# Patient Record
Sex: Female | Born: 1950
Health system: Southern US, Community
[De-identification: ages and names within clinical notes are randomized; demographics above are authoritative.]

## PROBLEM LIST (undated history)

## (undated) DIAGNOSIS — F32A Depression, unspecified: Secondary | ICD-10-CM

## (undated) DIAGNOSIS — E209 Hypoparathyroidism, unspecified: Secondary | ICD-10-CM

## (undated) DIAGNOSIS — E876 Hypokalemia: Secondary | ICD-10-CM

## (undated) DIAGNOSIS — E119 Type 2 diabetes mellitus without complications: Secondary | ICD-10-CM

## (undated) DIAGNOSIS — C73 Malignant neoplasm of thyroid gland: Secondary | ICD-10-CM

## (undated) DIAGNOSIS — IMO0001 Reserved for inherently not codable concepts without codable children: Secondary | ICD-10-CM

## (undated) DIAGNOSIS — I10 Essential (primary) hypertension: Secondary | ICD-10-CM

## (undated) DIAGNOSIS — E785 Hyperlipidemia, unspecified: Secondary | ICD-10-CM

## (undated) DIAGNOSIS — I509 Heart failure, unspecified: Secondary | ICD-10-CM

## (undated) DIAGNOSIS — S0285XA Fracture of orbit, unspecified, initial encounter for closed fracture: Secondary | ICD-10-CM

## (undated) DIAGNOSIS — E890 Postprocedural hypothyroidism: Secondary | ICD-10-CM

## (undated) DIAGNOSIS — N189 Chronic kidney disease, unspecified: Secondary | ICD-10-CM

## (undated) DIAGNOSIS — I2510 Atherosclerotic heart disease of native coronary artery without angina pectoris: Secondary | ICD-10-CM

## (undated) DIAGNOSIS — G4733 Obstructive sleep apnea (adult) (pediatric): Secondary | ICD-10-CM

## (undated) DIAGNOSIS — F329 Major depressive disorder, single episode, unspecified: Secondary | ICD-10-CM

## (undated) HISTORY — DX: Obstructive sleep apnea (adult) (pediatric): G47.33

## (undated) HISTORY — PX: BREAST SURGERY: SHX581

## (undated) HISTORY — PX: TUBAL LIGATION: SHX77

## (undated) HISTORY — PX: OVARY SURGERY: SHX727

## (undated) HISTORY — DX: Heart failure, unspecified: I50.9

## (undated) HISTORY — DX: Chronic kidney disease, unspecified: N18.9

## (undated) HISTORY — PX: CHOLECYSTECTOMY: SHX55

## (undated) HISTORY — DX: Postprocedural hypothyroidism: E89.0

## (undated) HISTORY — DX: Atherosclerotic heart disease of native coronary artery without angina pectoris: I25.10

## (undated) HISTORY — DX: Hyperlipidemia, unspecified: E78.5

## (undated) HISTORY — PX: ORBITAL FRACTURE SURGERY: SHX725

## (undated) HISTORY — DX: Malignant neoplasm of thyroid gland: C73

## (undated) HISTORY — DX: Hypokalemia: E87.6

## (undated) HISTORY — DX: Hypoparathyroidism, unspecified: E20.9

## (undated) HISTORY — PX: TONSILLECTOMY: SUR1361

## (undated) HISTORY — PX: CARDIAC CATHETERIZATION: SHX172

## (undated) HISTORY — DX: Type 2 diabetes mellitus without complications: E11.9

## (undated) HISTORY — PX: COLONOSCOPY: SHX174

## (undated) HISTORY — DX: Essential (primary) hypertension: I10

---

## 2000-03-16 ENCOUNTER — Encounter (INDEPENDENT_AMBULATORY_CARE_PROVIDER_SITE_OTHER): Payer: Self-pay | Admitting: *Deleted

## 2000-03-16 ENCOUNTER — Ambulatory Visit (HOSPITAL_BASED_OUTPATIENT_CLINIC_OR_DEPARTMENT_OTHER): Admission: RE | Admit: 2000-03-16 | Discharge: 2000-03-16 | Payer: Self-pay | Admitting: General Surgery

## 2000-12-31 ENCOUNTER — Encounter: Payer: Self-pay | Admitting: Family Medicine

## 2000-12-31 ENCOUNTER — Encounter: Admission: RE | Admit: 2000-12-31 | Discharge: 2000-12-31 | Payer: Self-pay | Admitting: Family Medicine

## 2002-12-31 ENCOUNTER — Ambulatory Visit (HOSPITAL_BASED_OUTPATIENT_CLINIC_OR_DEPARTMENT_OTHER): Admission: RE | Admit: 2002-12-31 | Discharge: 2002-12-31 | Payer: Self-pay | Admitting: Internal Medicine

## 2003-04-24 ENCOUNTER — Inpatient Hospital Stay (HOSPITAL_COMMUNITY): Admission: AD | Admit: 2003-04-24 | Discharge: 2003-04-27 | Payer: Self-pay | Admitting: Cardiology

## 2003-07-11 ENCOUNTER — Observation Stay (HOSPITAL_COMMUNITY): Admission: EM | Admit: 2003-07-11 | Discharge: 2003-07-13 | Payer: Self-pay | Admitting: Emergency Medicine

## 2003-09-13 ENCOUNTER — Emergency Department (HOSPITAL_COMMUNITY): Admission: EM | Admit: 2003-09-13 | Discharge: 2003-09-14 | Payer: Self-pay | Admitting: Emergency Medicine

## 2004-07-01 ENCOUNTER — Ambulatory Visit (HOSPITAL_COMMUNITY): Admission: RE | Admit: 2004-07-01 | Discharge: 2004-07-01 | Payer: Self-pay | Admitting: Gastroenterology

## 2004-07-02 ENCOUNTER — Encounter (INDEPENDENT_AMBULATORY_CARE_PROVIDER_SITE_OTHER): Payer: Self-pay | Admitting: Specialist

## 2004-07-16 ENCOUNTER — Encounter: Admission: RE | Admit: 2004-07-16 | Discharge: 2004-10-14 | Payer: Self-pay | Admitting: Family Medicine

## 2005-03-04 ENCOUNTER — Encounter: Admission: RE | Admit: 2005-03-04 | Discharge: 2005-03-04 | Payer: Self-pay | Admitting: Orthopedic Surgery

## 2006-03-10 HISTORY — PX: THYROIDECTOMY: SHX17

## 2006-05-22 ENCOUNTER — Ambulatory Visit (HOSPITAL_COMMUNITY): Admission: RE | Admit: 2006-05-22 | Discharge: 2006-05-22 | Payer: Self-pay | Admitting: Family Medicine

## 2006-06-05 ENCOUNTER — Ambulatory Visit (HOSPITAL_COMMUNITY): Admission: RE | Admit: 2006-06-05 | Discharge: 2006-06-05 | Payer: Self-pay | Admitting: Family Medicine

## 2006-06-05 ENCOUNTER — Encounter (INDEPENDENT_AMBULATORY_CARE_PROVIDER_SITE_OTHER): Payer: Self-pay | Admitting: *Deleted

## 2006-09-15 ENCOUNTER — Ambulatory Visit (HOSPITAL_COMMUNITY): Admission: RE | Admit: 2006-09-15 | Discharge: 2006-09-15 | Payer: Self-pay | Admitting: Surgery

## 2006-10-26 ENCOUNTER — Ambulatory Visit (HOSPITAL_COMMUNITY): Admission: RE | Admit: 2006-10-26 | Discharge: 2006-10-27 | Payer: Self-pay | Admitting: Surgery

## 2006-10-26 ENCOUNTER — Encounter (INDEPENDENT_AMBULATORY_CARE_PROVIDER_SITE_OTHER): Payer: Self-pay | Admitting: Surgery

## 2006-11-03 ENCOUNTER — Ambulatory Visit: Payer: Self-pay | Admitting: Endocrinology

## 2006-12-14 ENCOUNTER — Encounter: Payer: Self-pay | Admitting: *Deleted

## 2006-12-14 DIAGNOSIS — E119 Type 2 diabetes mellitus without complications: Secondary | ICD-10-CM

## 2006-12-14 DIAGNOSIS — E785 Hyperlipidemia, unspecified: Secondary | ICD-10-CM

## 2006-12-14 DIAGNOSIS — I2510 Atherosclerotic heart disease of native coronary artery without angina pectoris: Secondary | ICD-10-CM | POA: Insufficient documentation

## 2006-12-14 DIAGNOSIS — I509 Heart failure, unspecified: Secondary | ICD-10-CM

## 2006-12-14 DIAGNOSIS — I10 Essential (primary) hypertension: Secondary | ICD-10-CM | POA: Insufficient documentation

## 2006-12-14 HISTORY — DX: Atherosclerotic heart disease of native coronary artery without angina pectoris: I25.10

## 2006-12-14 HISTORY — DX: Type 2 diabetes mellitus without complications: E11.9

## 2006-12-14 HISTORY — DX: Essential (primary) hypertension: I10

## 2006-12-14 HISTORY — DX: Hyperlipidemia, unspecified: E78.5

## 2006-12-14 HISTORY — DX: Heart failure, unspecified: I50.9

## 2006-12-15 ENCOUNTER — Encounter: Payer: Self-pay | Admitting: Endocrinology

## 2006-12-15 ENCOUNTER — Ambulatory Visit: Payer: Self-pay | Admitting: Endocrinology

## 2006-12-15 DIAGNOSIS — E890 Postprocedural hypothyroidism: Secondary | ICD-10-CM

## 2006-12-15 DIAGNOSIS — C73 Malignant neoplasm of thyroid gland: Secondary | ICD-10-CM | POA: Insufficient documentation

## 2006-12-15 HISTORY — DX: Malignant neoplasm of thyroid gland: C73

## 2006-12-15 HISTORY — DX: Postprocedural hypothyroidism: E89.0

## 2006-12-15 LAB — CONVERTED CEMR LAB
Sed Rate: 20 mm/hr (ref 0–25)
TSH: 0.73 microintl units/mL (ref 0.35–5.50)

## 2006-12-21 ENCOUNTER — Ambulatory Visit: Payer: Self-pay | Admitting: Endocrinology

## 2006-12-21 ENCOUNTER — Encounter: Payer: Self-pay | Admitting: Endocrinology

## 2006-12-21 DIAGNOSIS — E209 Hypoparathyroidism, unspecified: Secondary | ICD-10-CM

## 2006-12-21 HISTORY — DX: Hypoparathyroidism, unspecified: E20.9

## 2006-12-21 LAB — CONVERTED CEMR LAB
CO2: 33 meq/L — ABNORMAL HIGH (ref 19–32)
GFR calc Af Amer: 66 mL/min
Glucose, Bld: 225 mg/dL — ABNORMAL HIGH (ref 70–99)
Potassium: 3.2 meq/L — ABNORMAL LOW (ref 3.5–5.1)

## 2006-12-24 ENCOUNTER — Encounter: Payer: Self-pay | Admitting: Endocrinology

## 2007-01-13 ENCOUNTER — Ambulatory Visit: Payer: Self-pay | Admitting: Endocrinology

## 2007-01-13 DIAGNOSIS — E876 Hypokalemia: Secondary | ICD-10-CM

## 2007-01-13 HISTORY — DX: Hypokalemia: E87.6

## 2007-01-13 LAB — CONVERTED CEMR LAB
Chloride: 98 meq/L (ref 96–112)
Creatinine, Ser: 1.4 mg/dL — ABNORMAL HIGH (ref 0.4–1.2)
Glucose, Bld: 99 mg/dL (ref 70–99)
Sodium: 142 meq/L (ref 135–145)

## 2007-01-14 LAB — CONVERTED CEMR LAB: PTH: <2.5 pg/mL — ABNORMAL LOW (ref 14.0–72.0)

## 2007-01-20 ENCOUNTER — Ambulatory Visit: Payer: Self-pay | Admitting: Pulmonary Disease

## 2007-01-20 ENCOUNTER — Encounter: Payer: Self-pay | Admitting: Endocrinology

## 2007-01-20 ENCOUNTER — Telehealth (INDEPENDENT_AMBULATORY_CARE_PROVIDER_SITE_OTHER): Payer: Self-pay | Admitting: *Deleted

## 2007-02-23 ENCOUNTER — Ambulatory Visit: Payer: Self-pay | Admitting: Pulmonary Disease

## 2007-02-23 DIAGNOSIS — G4733 Obstructive sleep apnea (adult) (pediatric): Secondary | ICD-10-CM

## 2007-02-23 HISTORY — DX: Obstructive sleep apnea (adult) (pediatric): G47.33

## 2007-03-09 ENCOUNTER — Telehealth (INDEPENDENT_AMBULATORY_CARE_PROVIDER_SITE_OTHER): Payer: Self-pay | Admitting: *Deleted

## 2007-05-12 ENCOUNTER — Telehealth: Payer: Self-pay | Admitting: Pulmonary Disease

## 2007-05-18 ENCOUNTER — Encounter: Payer: Self-pay | Admitting: Pulmonary Disease

## 2007-05-22 ENCOUNTER — Encounter: Payer: Self-pay | Admitting: Pulmonary Disease

## 2007-06-15 ENCOUNTER — Encounter: Payer: Self-pay | Admitting: Pulmonary Disease

## 2007-06-15 ENCOUNTER — Ambulatory Visit: Payer: Self-pay | Admitting: Endocrinology

## 2007-06-15 LAB — CONVERTED CEMR LAB
BUN: 30 mg/dL — ABNORMAL HIGH (ref 6–23)
Chloride: 95 meq/L — ABNORMAL LOW (ref 96–112)
GFR calc non Af Amer: 49 mL/min
Glucose, Bld: 121 mg/dL — ABNORMAL HIGH (ref 70–99)
Potassium: 3.2 meq/L — ABNORMAL LOW (ref 3.5–5.1)

## 2007-06-17 LAB — CONVERTED CEMR LAB
Calcium, Total (PTH): 9.5 mg/dL (ref 8.4–10.5)
PTH: 3 pg/mL — ABNORMAL LOW (ref 14.0–72.0)

## 2007-06-23 ENCOUNTER — Telehealth: Payer: Self-pay | Admitting: Endocrinology

## 2007-08-11 ENCOUNTER — Telehealth: Payer: Self-pay | Admitting: Internal Medicine

## 2007-08-13 ENCOUNTER — Encounter: Payer: Self-pay | Admitting: Internal Medicine

## 2007-09-06 ENCOUNTER — Encounter: Payer: Self-pay | Admitting: Endocrinology

## 2007-12-13 ENCOUNTER — Ambulatory Visit: Payer: Self-pay | Admitting: Endocrinology

## 2007-12-13 LAB — CONVERTED CEMR LAB: TSH: 0.74 microintl units/mL (ref 0.35–5.50)

## 2008-07-18 ENCOUNTER — Ambulatory Visit: Payer: Self-pay | Admitting: Endocrinology

## 2008-07-23 ENCOUNTER — Encounter: Payer: Self-pay | Admitting: Endocrinology

## 2010-03-18 ENCOUNTER — Encounter: Payer: Self-pay | Admitting: Endocrinology

## 2010-03-18 LAB — CONVERTED CEMR LAB
Albumin: 4.3 g/dL
Alkaline Phosphatase: 53 units/L
BUN: 30 mg/dL
CO2: 32 meq/L
Calcium: 8.6 mg/dL
Creatinine, Ser: 1.23 mg/dL
Direct LDL: 164 mg/dL
GFR calc non Af Amer: 44.39 mL/min
Hgb A1c MFr Bld: 6.8 %
Lymphocytes, automated: 2.6 %
MCV: 91.1 fL
Monocytes Relative: 0.7 %
Neutrophils Relative %: 5.1 %
Platelets: 311 10*3/uL
RDW: 12.7 %
Sodium: 140 meq/L
Total Protein: 7.3 g/dL

## 2010-03-31 ENCOUNTER — Encounter: Payer: Self-pay | Admitting: Family Medicine

## 2010-04-01 ENCOUNTER — Encounter: Payer: Self-pay | Admitting: Endocrinology

## 2010-04-09 ENCOUNTER — Ambulatory Visit: Admission: RE | Admit: 2010-04-09 | Payer: Self-pay | Source: Home / Self Care | Admitting: Endocrinology

## 2010-04-09 ENCOUNTER — Encounter: Payer: Self-pay | Admitting: Endocrinology

## 2010-04-15 DIAGNOSIS — E890 Postprocedural hypothyroidism: Secondary | ICD-10-CM

## 2010-04-17 NOTE — Assessment & Plan Note (Signed)
Summary: F/U DIABETES/#/CD   Vital Signs:  Patient profile:   60 year old female Height:      62 inches (157.48 cm) Weight:      189 pounds (85.91 kg) BMI:     34.69 O2 Sat:      96 % on Room air Temp:     98.8 degrees F (37.11 degrees C) oral Pulse rate:   65 / minute BP sitting:   146 / 88  (left arm) Cuff size:   large  Vitals Entered By: Rebeca Alert CMA Deborra Medina) (April 09, 2010 8:47 AM)  O2 Flow:  Room air CC: Follow up on thyroid and DM/pt is no longer taking Plavix or Isosorbide Dinitrate/aj Is Patient Diabetic? Yes   Referring Provider:  Antony Contras MD Primary Provider:  Antony Contras MD  CC:  Follow up on thyroid and DM/pt is no longer taking Plavix or Isosorbide Dinitrate/aj.  History of Present Illness: (pt rescheduled)  Current Medications (verified): 1)  Tenormin 50 Mg  Tabs (Atenolol) .... Take 1 By Mouth Qd 2)  Plavix 75 Mg  Tabs (Clopidogrel Bisulfate) .... Take 1 By Mouth Qd 3)  Zestril 20 Mg  Tabs (Lisinopril) .... Take 1 By Mouth Qd 4)  Hydrochlorothiazide 25 Mg  Tabs (Hydrochlorothiazide) .... Take 1/2 Tab By Mouth Once Daily 5)  Levothyroxine Sodium 125 Mcg  Tabs (Levothyroxine Sodium) .... Take 1 By Mouth Qd 6)  Bayer Low Strength 81 Mg  Tbec (Aspirin) .... Take 1 Tablet By Mouth Once A Day 7)  Triazolam 0.25 Mg Tabs (Triazolam) .... 2 Tablets By Mouth At Bedtime 8)  Isosorbide Dinitrate 30 Mg  Tabs (Isosorbide Dinitrate) .... Take 1 By Mouth Qd 9)  Oscal 500/200 D-3 500-200 Mg-Unit  Tabs (Calcium-Vitamin D) .... Take 2 By Mouth Qd 10)  Calcitriol 0.25 Mcg Caps (Calcitriol) .Marland Kitchen.. 1 Qd 11)  One-A-Day Extras Antioxidant  Caps (Multiple Vitamins-Minerals) .Marland Kitchen.. 1 By Mouth Once Daily 12)  Fish Oil 1000 Mg Caps (Omega-3 Fatty Acids) .Marland Kitchen.. 1 Capsule By Mouth Once Daily 13)  Metformin Hcl 1000 Mg Tabs (Metformin Hcl) .Marland Kitchen.. 1 Tablet By Mouth Two Times A Day  Allergies (verified): No Known Drug Allergies   Medications Added to Medication List This  Visit: 1)  Triazolam 0.25 Mg Tabs (Triazolam) .... 2 tablets by mouth at bedtime 2)  One-a-day Extras Antioxidant Caps (Multiple vitamins-minerals) .Marland Kitchen.. 1 by mouth once daily 3)  Fish Oil 1000 Mg Caps (Omega-3 fatty acids) .Marland Kitchen.. 1 capsule by mouth once daily 4)  Metformin Hcl 1000 Mg Tabs (Metformin hcl) .Marland Kitchen.. 1 tablet by mouth two times a day  Patient Instructions: 1)  same levothyroxine (125/day) 2)  resume calcitriol (0.25 micrograms/d) 3)  ret 6 mos

## 2010-04-17 NOTE — Letter (Signed)
Summary: [facility]   Imported By: Phillis Knack 04/12/2010 11:35:38  _____________________________________________________________________  External Attachment:    Type:   Image     Comment:   External Document

## 2010-04-25 ENCOUNTER — Encounter: Payer: Self-pay | Admitting: Endocrinology

## 2010-04-25 ENCOUNTER — Ambulatory Visit (INDEPENDENT_AMBULATORY_CARE_PROVIDER_SITE_OTHER): Payer: BC Managed Care – PPO | Admitting: Endocrinology

## 2010-04-25 ENCOUNTER — Other Ambulatory Visit: Payer: BC Managed Care – PPO

## 2010-04-25 ENCOUNTER — Encounter (INDEPENDENT_AMBULATORY_CARE_PROVIDER_SITE_OTHER): Payer: Self-pay | Admitting: *Deleted

## 2010-04-25 ENCOUNTER — Other Ambulatory Visit: Payer: Self-pay | Admitting: Endocrinology

## 2010-04-25 DIAGNOSIS — C73 Malignant neoplasm of thyroid gland: Secondary | ICD-10-CM

## 2010-04-25 LAB — CONVERTED CEMR LAB
Calcium, Total (PTH): 8.2 mg/dL — ABNORMAL LOW (ref 8.4–10.5)
PTH: <2.5 pg/mL — ABNORMAL LOW (ref 14.0–72.0)

## 2010-05-07 NOTE — Assessment & Plan Note (Signed)
Summary: FOLLOW UP ON THYROID/NWS   Vital Signs:  Patient profile:   60 year old female Height:      62 inches (157.48 cm) Weight:      185 pounds (84.09 kg) BMI:     33.96 O2 Sat:      98 % on Room air Temp:     98.4 degrees F (36.89 degrees C) oral Pulse rate:   62 / minute Pulse rhythm:   regular BP sitting:   122 / 82  (left arm) Cuff size:   large  Vitals Entered By: Rebeca Alert CMA ([place]) (April 25, 2010 8:02 AM)  O2 Flow:  Room air CC: Follow up on thyroid/Pt is no  longer taking Plavix or Isosorbie Dintrate/aj Is Patient Diabetic? Yes   Referring Provider:  Antony Contras MD Primary Provider:  Antony Contras MD  CC:  Follow up on thyroid/Pt is no  longer taking Plavix or Isosorbie Dintrate/aj.  History of Present Illness: the status of at least 3 ongoing medical problems is addressed today: postsurgical hypoparathyroidism:  no cramps postsurgical hypothyroidism: no weight gain pt had thyroidectomy for 2 small foci of papillary cancer.  foci were not large enough to warrant i-131 rx.  she does not notice any nodule at the neck.  Current Medications (verified): 1)  Tenormin 50 Mg  Tabs (Atenolol) .... Take 1 By Mouth Qd 2)  Plavix 75 Mg  Tabs (Clopidogrel Bisulfate) .... Take 1 By Mouth Qd 3)  Zestril 20 Mg  Tabs (Lisinopril) .... Take 1 By Mouth Qd 4)  Hydrochlorothiazide 25 Mg  Tabs (Hydrochlorothiazide) .... Take 1/2 Tab By Mouth Once Daily 5)  Levothyroxine Sodium 125 Mcg  Tabs (Levothyroxine Sodium) .... Take 1 By Mouth Qd 6)  Bayer Low Strength 81 Mg  Tbec (Aspirin) .... Take 1 Tablet By Mouth Once A Day 7)  Triazolam 0.25 Mg Tabs (Triazolam) .... 2 Tablets By Mouth At Bedtime 8)  Isosorbide Dinitrate 30 Mg  Tabs (Isosorbide Dinitrate) .... Take 1 By Mouth Qd 9)  Oscal 500/200 D-3 500-200 Mg-Unit  Tabs (Calcium-Vitamin D) .... Take 2 By Mouth Qd 10)  Calcitriol 0.25 Mcg Caps (Calcitriol) .Marland Kitchen.. 1 Qd 11)  One-A-Day Extras Antioxidant  Caps (Multiple  Vitamins-Minerals) .Marland Kitchen.. 1 By Mouth Once Daily 12)  Fish Oil 1000 Mg Caps (Omega-3 Fatty Acids) .Marland Kitchen.. 1 Capsule By Mouth Once Daily 13)  Metformin Hcl 1000 Mg Tabs (Metformin Hcl) .Marland Kitchen.. 1 Tablet By Mouth Two Times A Day 14)  Citalopram Hydrobromide 40 Mg Tabs (Citalopram Hydrobromide) .Marland Kitchen.. 1 Tablet By Mouth Once Daily  Allergies (verified): No Known Drug Allergies  Past History:  Past Medical History: Stage 1 papillary adenocarcinoma THYROID, one very small focus each lobe (49mm, 2 mm)  (ICD-193) s/p thyroidect 2008 6/09: ct neck:  no evidence of recurrence HYPOTHYROIDISM, POSTSURGICAL (ICD-244.0 HYPERTENSION (ICD-401.9) HYPERLIPIDEMIA (ICD-272.4) DIABETES MELLITUS, TYPE II (ICD-250.00) CORONARY ARTERY DISEASE (ICD-414.00) CONGESTIVE HEART FAILURE (ICD-428.0) HYPOPARATHYROIDISM (ICD-252.1)  Review of Systems  The patient denies weight loss.         no neck pain  Physical Exam  General:  obese.   Neck:  a healed scar is present.  i do not appreciate a nodule in the thyroid or elsewhere in the neck  Additional Exam:  FastTSH                   0.53 uIU/mL   Parathyroid Hormone  [L]  <2.5 pg/mL  14.0-72.0 Calcium              [L]  8.2 mg/dL       Impression & Recommendations:  Problem # 1:  HYPOTHYROIDISM, POSTSURGICAL (ICD-244.0) she does not need suppression in view of the mild nature of #2  Problem # 2:  NEOPLASM, MALIGNANT, THYROID GLAND (ICD-193) h/o 2 very small foci resected.  no evidence of recurrence  Problem # 3:  HYPOPARATHYROIDISM (ICD-252.1) postsurgical.  studies have supported a therapeutic goal of ca++ of 8-9, rather than the normal range.    Medications Added to Medication List This Visit: 1)  Citalopram Hydrobromide 40 Mg Tabs (Citalopram hydrobromide) .Marland Kitchen.. 1 tablet by mouth once daily  Other Orders: T-Parathyroid Hormone, Intact w/ Calcium [id number]) TLB-TSH (Thyroid Stimulating Hormone) (84443-TSH) Est. Patient Level IV  [id number])  Patient Instructions: 1)  blood tests are being ordered for you today.  please call [phone] to hear your test results. 2)  pending the test results, please continue the same medications for now 3)  Please schedule a follow-up appointment in 1 year. 4)  (update: i left message on phone-tree:  rx as we discussed)   Orders Added: 1)  T-Parathyroid Hormone, Intact w/ Calcium [id number] 2)  TLB-TSH (Thyroid Stimulating Hormone) [84443-TSH] 3)  Est. Patient Level IV [id number]

## 2010-07-23 NOTE — Consult Note (Signed)
[place]   NAME:Sandy Roy, Sandy Roy                    MRN:          [id number]  DATE:11/03/2006                            DOB:          1950/06/25    REASON FOR REFERRAL:  Thyroid cancer.   HISTORY OF PRESENT ILLNESS:  A 60 year old woman who states that she has  noted a goiter for the past few years.  She underwent needle biopsy  earlier this year on both thyroid lobes and ultimately came to  thyroidectomy.   Regarding her postoperative hypothyroidism, I am asked to prescribe some  medication for her.  She states that she has only minimal dizziness, and  I am not even sure if this is related to the thyroid.   She also states she has a few muscle cramps since her surgery, but she  did not have postoperative hypocalcemia.   PAST MEDICAL HISTORY:  1. Type 2 diabetes.  2. Dyslipidemia.  3. CAD.  4. Apparent history of CHF.   SOCIAL HISTORY:  She is retired.  She has been widowed for many years.   FAMILY HISTORY:  Her mother had a thyroidectomy for an uncertain reason.   REVIEW OF SYSTEMS:  Denies syncope.  Denies shortness of breath.   PHYSICAL EXAMINATION:  Blood pressure is 116/79, heart rate 61,  temperature 98.1.  The weight is 173.  GENERAL:  No distress.  SKIN:  No rash, not diaphoretic.  HEENT:  No proptosis, no periorbital swelling.  NECK:  There is a healing surgical scar.  I do not appreciate any  lymphadenopathy at the neck nor at the supraclavicular area.  CHEST:  Clear to auscultation.  No respiratory distress.  CARDIOVASCULAR:  Trace bilateral pretibial edema.  Regular rate and  rhythm.  No murmur.  Pedal pulses are intact.  NEUROLOGIC:  Alert, oriented.  Does not appear anxious nor depressed.  There is no tremor.   LABORATORY STUDIES:  I have reviewed the pathology report from surgery  with the patient.  She had two postoperative calcium determinations in  the  hospital.  One was 8.8 and the other was 8.9.   IMPRESSION:  1. Stage I papillary adenocarcinoma of the thyroid.  Although she had      bilateral foci, they were very small.  2. She has the expected postoperative hypothyroidism.  3. A few muscle cramps in the setting of a low normal serum calcium      level.  The cramps may be due to her hypothyroidism.   PLAN:  1. I discussed with her the natural history of papillary      adenocarcinoma of the thyroid.  I have told her that the      overwhelming likelihood is that this thyroidectomy has been      curative.  2. Synthroid 125 mcg a day.  3. Return in about six weeks.  4. I have told her she does not need any treatment for this low-normal      calcium.     Sean A. Loanne Drilling, MD  Electronically Signed    SAE/MedQ  DD: 11/03/2006  DT: 11/03/2006  Job #: [id number]   cc:   Adin Hector, MD  Antony Contras, M.D.

## 2010-07-23 NOTE — Assessment & Plan Note (Signed)
[place]                             PULMONARY OFFICE NOTE   CATESSA, KNOX                    MRN:          [id number]  DATE:01/20/2007                            DOB:          1950/04/17    REFERRING PHYSICIAN:  Antony Contras, M.D.   SLEEP MEDICINE CONSULTATION   HISTORY OF PRESENT ILLNESS:  The patient is a 60 year old female whom I  have been asked to see for obstructive sleep apnea.  The patient was  first diagnosed in 2004 with an apnea/hypopnea index of 27 events per  hour and O2 desaturation as low as 76%.  The patient was tried on CPAP,  but she discontinued this because she could not watch TV at the same  time that she wore the CPAP.  The patient states that she has multiple  sleeping difficulties, including sleep onset insomnia.  She states that  she cannot go to sleep or stay asleep without sleeping pills.  If she  takes sleeping pills, she typically goes to bed between 9 and 11.  If  she does not take sleeping pills, she will not get to sleep until 4 a.m.  She typically will get up at 6 a.m. to start her day and feels  completely rested on nights she does take sleeping pills, and does not  feel rested on nights that she does not.  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  She denies decreased alertness or concentration issues with periods of  inactivity.  She has no difficulties with reading or watching TV.  She  has no sleepiness with driving.  Her weight is neutral over the last few  years.  The patient is most concerned with breathing issues that  actually occur during the day, and feels that she does not have a normal  breathing pattern.  She is wondering that, if she treats her sleep apnea  at night, if her breathing pattern would be more normal.   PAST MEDICAL HISTORY:  1. Hypertension.  2. Diabetes.  3. Dyslipidemia.  4. History of cholecystectomy in 1998.  5. History of thyroidectomy.   MEDICATIONS:  Unknown.   The patient has no known drug allergies.   SOCIAL HISTORY:  The patient is widowed and has children.  She has a  history of smoking, but does not remember when she quit.   FAMILY HISTORY:  Remarkable for a mother had a heart disease.  Father  had pancreatic cancer.   REVIEW OF SYSTEMS:  As per history of present illness.  Also see patient  intake form documented on the chart.   PHYSICAL EXAM:  GENERAL:  She is an obese female in no acute distress.  Blood pressure 134/84, pulse 61, temperature 98.1, weight 196 pounds.  She is 5 feet 3 inches tall.  O2 saturation on room air is 98%.  HEENT:  Pupils are equal, round, and reactive to light and  accommodation.  Extraocular muscles are intact.  Nares patent without  discharge.  Oropharynx does show elongation of the soft palate and  uvula.  NECK:  Supple without JVD or lymphadenopathy.  No palpable thyromegaly.  CHEST:  Totally clear.  CARDIAC:  Regular rate and rhythm.  No murmurs, rubs, or gallops.  ABDOMEN:  Soft and nontender with good bowel sounds.  GENITAL, RECTAL, AND BREASTS:  Not done and not indicated.  LOWER EXTREMITIES:  Without edema.  Pulses are intact distally.  NEUROLOGIC:  Alert and oriented with no obvious motor deficits.   IMPRESSION:  1. Moderate obstructive sleep apnea documented in the past on her      sleep study.  I suspect the patient is more symptomatic than she      lets on, and think that she would benefit from a trial of      treatment.  However, the patient has to be willing to make      sacrifices associated with CPAP therapy and has to be open-minded      in the initial trial period.  The patient is willing to do this,      but I am not sure how successful it will be for her.  I do think      that this is effecting her sleep much more than she is letting on.  2. Sleep onset insomnia that I suspect is secondary to      psychophysiologic insomnia.   I have told the patient that there is      a way that we can try to get her off of her sleeping medication,      but it will take great sacrifice on her part to do so, cognitive      behavioral therapy as well as stimulus control.  The patient is not      sure that she wants to do that at this particular time.   PLAN:  1. We will go ahead and place the patient on an auto-CPAP device for      the next 4 weeks since she has documented sleep apnea, and try and      find her optimal pressure, but also give her something that is very      comfortable right from the start.  2. The patient will follow up in 4 weeks, sooner if there are      problems.     Kathee Delton, MD,FCCP  Electronically Signed    [facility]MC/MedQ  DD: 01/25/2007  DT: 01/25/2007  Job #: [id number]   cc:   Antony Contras, M.D.

## 2010-07-23 NOTE — Op Note (Signed)
NAME:  Sandy Roy, Sandy Roy NO.:  [id number]   MEDICAL RECORD NO.:  [id number]          PATIENT TYPE:  OIB   LOCATION:  5706                         FACILITY:  [facility]   PHYSICIAN:  Adin Hector, MD     DATE OF BIRTH:  03-22-1950   DATE OF PROCEDURE:  DATE OF DISCHARGE:                               OPERATIVE REPORT   PRIMARY CARE PHYSICIAN:  Antony Contras, M.D.   ENDOCRINOLOGIST:  Cecille Amsterdam, M.D.,  with [facility] at [facility]   SURGEON:  Adin Hector, MD.   ASSISTANT SURGEON:  Thana Farr.   PREOPERATIVE DIAGNOSIS:  Multinodular goiter with recurrent cysts,  refractory to Synthroid repression, and worsening dysphagia and globus.   POSTOPERATIVE DIAGNOSIS:  Multinodular goiter with recurrent cysts,  refractory to Synthroid repression, and worsening dysphagia and globus.   PROCEDURE PERFORMED:  Total thyroidectomy.   ANESTHESIA:  1. General anesthesia.  2. Bupivacaine 0.25% with epinephrine in a field block around the      incision.   DRAINS:  None.   ESTIMATED BLOOD LOSS:  5 mL.   COMPLICATIONS:  None apparent.   INDICATIONS FOR PROCEDURE:  Sandy Roy is a 60 year old female who has  been struggling with multinodular goiter in the past for over a decade.  She has had intermittent followup.  She has had known cysts for several  years.  She has had at least two aspirations done in the past and,  although the fluid has tended to be benign, the cysts have come back.  She has tried Synthroid suppression without any help.   She was sent for an __________ consultation and initially they felt  observation would be reasonable.  However, the patient has started  developing worsening symptoms of fullness in her throat and some  difficulty swallowing and, based on worsening discomfort over the past  few months, she requested surgical consultation.  After discussion with  me and partners, we felt that a thyroidectomy would be reasonable.   The risks such as stroke, heart attack, deep vein thrombosis, pulmonary  embolism and death were discussed.  The risks such as bleeding,  ecchymosis, seroma, hematoma, wound infection, abscess, injury to other  organs were discussed.  The risks of nerve injury with resulting  difficulty speaking, singing or breathing were discussed as well.  The  risks of hoarseness, dysphagia and other pains were discussed.  The need  for long-term hormonal replacement and risks of long-term Synthroid were  discussed as well.  The risk of parathyroid gland injury, calcium risks  and hypocalcemic risks and other risks were discussed in detail.  Questions were answered and she agreed to procedure.   OPERATIVE FINDINGS:  Her superior lobe seemed relatively normal, but  both inferior lobes did have some moderate sized cysts within it.  She  had a larger cyst involving most of the isthmus laying right on top of  her trachea.  There was no invasion into any other structures.  The  recurrent laryngeal nerves could be seen on both sides easily and were  protected at all times.   DESCRIPTION  OF PROCEDURE:  Informed consent was confirmed just prior to  going to into the operating room.  She had sequential compression  devices active during the entire case.  She received IV antibiotics just  prior to surgery.  She was positioned supine with both arms tucked.  She  underwent general anesthesia without any difficulty.  She had a gentle  roll placed under her thoracic spine and her neck was gently  hyperextended for better positioning of her neck.  An initial 6 cm  ultimately extended to about a 7.5-cm curvilinear incision was made  about a fingerbreadth above her clavicles within the natural skin folds  of her neck.  A __________ was used to get through her subcutaneous fat  and platysmal plane.  Subplatysmal planes were created inferiorly and  superiorly.  Some moderate sized venous structures were encountered  and  skeletonized and these were controlled using harmonic cautery, clips  and/or ties.  I found a space in between her strap muscles in the  midline and did encounter the isthmus of the thyroid.   Attention was turned towards the right.  A plane was created posterior  to the strap muscles and the anterior right thyroid lobe using primary  blunt dissection to help skeletonize wispy structures and control those  for thin structures, and then clips and harmonic on medium structures,  and ties, clips and harmonic on larger structures.  Attention was  initially turned to getting towards the middle part of the lobe.  We  swung over superiorly and I was able to go up laterally and stay close  to the superior thyroid lobe cephalad until we encountered its apex.  The superior thyroid artery and vein were individually skeletonized and  ligated as noted above using 2-0 silk ties, clip and harmonic on the  neck side.  A window was made in the medial aspect of the superior  thyroid lobe as well and this allowed complete superior lobe  mobilization.  Attention was turned towards the inferior lobe and we  were able to skeletonize and easily see the inferior parathyroid and  superior parathyroid lobes and allow them to gently reflect off the  thyroid gland.  Again, the inferior parathyroid arterial and venous  branches were ligated closely to the thyroid gland.  There was somewhat  of a hardened calcific nodule posteriorly near the ligament of Berry,  but ultimately was able to get around that carefully.  The laryngeal  nerve on the right side could be seen and preserved and no cautery was  used on the posterior wall of the thyroid, once we were able to reflect  the thyroid in a lateral to medial fashion and help free the thyroid  isthmus off the trachea using sharp and cautery dissection anteriorly.   Attention was turned to mobilization of the left lobe of the thyroid.  Mobilization was done in a  similar fashion.  Initially there was a  thickened cystic lobule coming off the inferior thyroid, which was  thought could have been an enlarged parathyroid gland; but, after  further skeletonization, was actually a lingual branch off the inferior  thyroid and ultimately this was able to be skeletonized and preserved.  __________ encompassing the inferior parathyroid gland.  Again, the  recurrent laryngeal nerve on the left side could be seen and it was  preserved at all times.  Ultimately with careful mobilization,  alternating between inferior and superior and medial mobilization, I was  able to free up  the left lobe of the thyroid out along the isthmus and  it was completely extracted.   The specimen was labeled Short Stitch in the Superior Lobe of the Right  Thyroid; and a long stitch was placed in the Superior Lobe of the Left  Thyroid.   Note that careful hemostasis was maintained using pressure and clips and  Surgicel.  Careful wound inspection revealed excellent hemostasis with  nerves intact.  The four good candidates for parathyroid gland were in  classic anatomic position and appeared to be not bruised or damaged.  With good hemostasis, we reapproximated the straps in the midline using  figure-of-eight 3-0 Vicryl interrupted stitches, leaving an inferior  centimeter free.  The platysma was reapproximated transversely using 3-0  Vicryl interrupted figure-of-eight stitches as well.  The skin was  closed using a 4-0 Prolene stitch in a running subcuticular fashion,  with the tails brought out either side as Steri-Strips were placed  across the incision as well with good result.  A sterile dressing was  applied.  The patient was activated and sent to the recovery room in  stable condition.   An attempt was locate family, but the patient had none with her and did  not have anybody she wished for me to call.  I rechecked and the waiting  area did not have anybody as well.  I will  try and talk to her later.      Adin Hector, MD  Electronically Signed     SCG/MEDQ  D:  10/26/2006  T:  10/26/2006  Job:  [id number]   cc:   Antony Contras, M.D.  Cecille Amsterdam, M.D.

## 2010-07-26 NOTE — Discharge Summary (Signed)
NAME:  Sandy Roy, Sandy Roy                       ACCOUNT NO.:  [id number]   MEDICAL RECORD NO.:  [id number]                   PATIENT TYPE:  INP   LOCATION:  [id number]                                 FACILITY:  [facility]   PHYSICIAN:  Kerry Hough., M.D.         DATE OF BIRTH:  1950/12/23   DATE OF ADMISSION:  04/24/2003  DATE OF DISCHARGE:  04/27/2003                                 DISCHARGE SUMMARY   DISCHARGE DIAGNOSES:  1. Prolonged chest discomfort with borderline troponin consistent with     myocardial ischemia.     A. Catheterization showed anteroapical hypokinesis.     B. Electrocardiogram showed anterior T wave inversions; however, left        anterior descending showed only a small distal left anterior        descending suggesting coronary spasm as the cause.  2. Type II diabetes mellitus.  3. Cigarette abuse.  4. Hypertension.  5. Anxiety and depression.  6. Hyperlipidemia.  7. Obesity.   PROCEDURE:  Cardiac catheterization.   HISTORY:  [age]-year-old female who has a prior history of an occluded  right coronary artery in 1997.  She has known hypertension, hyperlipidemia  and obesity, and has recently started smoking again. She had a Cardiolite  scan in December that did not show significant ischemia.  She had been  walking, exercising and feeling well.  The day prior to admission she  developed increasing dyspnea and midsternal tightness that lasted through  the afternoon.  After three hours of discomfort her daughter was called and  she was taken to [facility] where she was admitted.   EKG was unremarkable, but she did develop some biphasic anterior T wave  inversions.  Troponins were borderline at 0.1 and she is transferred per her  request to [facility] for further evaluation.   Please see previously dictated history and physical for the remainder of the  details.   HOSPITAL COURSE:  Laboratory data showed a B-natruretic peptide  of 1,590.  Hemoglobin was 19.9 and hematocrit was 32.6.  sodium was 140, potassium 3.6,  chloride 104, glucose 143, and hemoglobin A-1-C was 63.  Cholesterol was  198, triglycerides 150.  HDL cholesterol was 54.  LDL cholesterol was 114.  B-natruretic peptide was 727 at [facility] and troponin 0.11, which  was borderline.   The patient was admitted and was placed on therapy with anticoagulation.  Her medicines were increased.  She did develop significant biphasic T wave  inversions, but has not recurrence of chest discomfort.  She was given Lasix  because of the elevated BNP as well as her dyspnea.  Chest x-ray showed mild  cardiomegaly with no acute process noted.  She was to go to the  catheterization laboratory, but the procedure was cancelled due to cath lab  emergencies when her case was bumped and we could not take her to the lab.  She was taken  to the laboratory on April 26, 2003 with findings of an  anteroapical wall motion abnormality noted at catheterization.  The right  coronary artery was occluded.  The circumflex was tortuous, but did not have  significant disease.  The left anterior descending did not have significant  proximal disease, the distal vessel was tortuous and was somewhat small, but  no significant obstructive stenoses were noted.   It was also apparent that the patient had resumed smoking and she was  advised to discontinue cigarette smoking.   The patient was ambulatory in the hall and was seen by cardiac rehab.   The patient was discharged in improved condition begin treated presumptively  for significant coronary artery spasm.   DISCHARGE MEDICATIONS:  The patient was discharge at the time on:  1. Aspirin 81 mg daily,  2. Plavix 75 mg daily.  3. Zocor 40 mg daily, which was increased.  4. Lasix 20 mg daily.  5. Cardizem CD 360 mg daily.  6. Imdur 30 mg daily.  7. _____________ 0.25 mg 2 tablets q.h.s.  8. Prinivil 20 mg daily.  9.  Avandamet 2/100 mg daily.  10.      Atenolol 50 mg daily.   DISCHARGE INSTRUCTIONS:  The patient is to be out of work for two weeks and  is to walk daily.  She is to call if there are further problems.   FOLLOW UP:  We will see her in follow up in one week for further evaluation.                                                Kerry Hough., M.D.    WST/MEDQ  D:  04/27/2003  T:  04/28/2003  Job:  [id number]   cc:   Biagio Borg, M.D.  [address], [place]  [place]  [place] [address]  Fax: [phone]

## 2010-07-26 NOTE — H&P (Signed)
NAME:  Sandy Roy, Sandy Roy                       ACCOUNT NO.:  [id number]   MEDICAL RECORD NO.:  [id number]                   PATIENT TYPE:  OBV   LOCATION:  [id number]                                 FACILITY:  [facility]   PHYSICIAN:  Annita Brod, M.D.            DATE OF BIRTH:  14-Jun-1950   DATE OF ADMISSION:  07/11/2003  DATE OF DISCHARGE:                                HISTORY & PHYSICAL   PRIMARY CARE PHYSICIAN:  Biagio Borg, M.D.   CARDIOLOGIST:  Ezzard Standing, M.D.   CHIEF COMPLAINT:  Lightheadedness, almost passing out.   This is a 60 year old African American female, past medical history of  diabetes, CAD, and obesity who presents with a one day history of dizziness.  The patient states overall she has been relatively well.  For the past day  or so, she has been feeling increasingly fatigued and weak. She denies any  vertigo like symptoms.  She denies any nausea or vomiting.  But today she  felt very lightheaded and had to sit down.  She did not actually pass out.  She remained conscious the entire time as well as having full recollection.  She felt slightly diaphoretic, but she denied any chest pain or  palpitations.  She denied any shortness of breath, wheeze, or cough.  She  denies any recent fevers or chills.  She has no sick contacts.  She was  brought into the emergency room via paramedics.  When the patient first came  in, she was noted to have a heart rate of 50 with a blood pressure of  121/63.  She was started on telemetry, and her heart rate has remained low,  no higher than 46.  Her blood pressure has remained stable, although reports  by paramedics en route it appears the patient had a lower blood pressure  with a systolic in the Q000111Q.  Actually, it appears to be that her blood  pressure was in the Q000111Q systolic at work but en route it was stable in the  120s.  An EKG was done which showed no evidence of any ST-T abnormalities,  but a marked sinus  bradycardia, with a first degree A-V block, and a heart  rate of 44.  Attempts at IV fluids and checking a set of cardiac markers  were limited due to the patient's IV access which is currently being worked  on.  Currently, the patient states that she is feeling better.  While  resting, she denies any dizziness.  She denies any lightheadedness when she  lies down.  Even when sitting up, she denies any lightheadedness, but when  standing she does feel slightly dizzy.  She denies any chest pain or  palpitations.  She has no other complaints.  Her heart rate continues to  remain low.  The patient otherwise denies any abdominal pain.  She denies  any hematuria, dysuria, or constipation, diarrhea.  She denies any  focal  pain, extremity weakness, numbness, tingling.  She states her diabetes is  not too well controlled, and she has ups and downs.   PAST MEDICAL HISTORY:  1. Diabetes.  2. Coronary artery disease with an admission, in February 2005.  At that     time, she was noted to have evidence of borderline ischemia.  3. She has a history of obesity.  4. Not well controlled diabetes mellitus.   MEDICATIONS:  The patient, according to her previous discharge summary, was  on:  1. Cardizem 360.  2. Plavix 75.  3. Aspirin 81 mg daily.  4. Zocor 40 q.h.s.  5. Lasix 20 daily.  6. Imdur 30 daily.  7. Prinivil 20 mg daily.  8. Atenolol 50 mg daily.  9. Avandamet 2/100 daily.   ALLERGIES:  She has no known drug allergies.   SOCIAL HISTORY:  She denies any tobacco or drug use.  She occasionally  drinks a glass of wine or a beer but no heavy use.   FAMILY HISTORY:  Positive for CAD, diabetes, and hypertension.   PHYSICAL EXAMINATION:  VITAL SIGNS:  On admission, temp 97.9, respirations  18, heart rate 50 which has come down to as low as 45, blood pressure  121/63, O2 sat 98% on room air.  GENERAL:  She appears to be alert and oriented  x 3.  No apparent distress.  HEENT:  Normocephalic,  atraumatic.  Her mucous membranes are slightly dry.  She has no carotid bruits.  HEART:  Regular rhythm with no evidence of any murmur or rub, but she does  have evidence of bradycardia.  LUNGS:  Clear to auscultation bilaterally.  ABDOMEN:  Soft, nontender, obese.  Positive bowel sounds.  EXTREMITIES:  She has 2+ palpable pulses, though again, they are  bradycardic.  She has a 1+ pitting edema.  NEUROLOGIC:  She has no evidence of any focal neurological deficit.   LAB WORK:  Ordered and pending, given accessibility.   ASSESSMENT/PLAN:  1. This 60 year old African American female, with coronary artery disease,     presents presyncope and a heart rate of 45-50, and reportedly a low blood     pressure in the outside field.  Noted, the patient is on Cardizem 350 and     atenolol 50.  I suspect that her bradycardia is medication related which     has contributed to her presyncope feeling.  We will go ahead and     intravenously hydrate, observe her for 24 hours with telemetry.  Once her     blood pressure and pulse are stable and improved, we will go ahead and     restart her oral medications, her ACE inhibitor, her Lasix, and her Imdur     and then may be able to restart her atenolol if her heart rate is     improved by morning.  I will not restart her Cardizem at this time.  If     her heart rate does improve, and her electrocardiogram and cardiac     enzymes followup in the a.m. are normal, we will discharge to home with     followup with her cardiologist.  If her heart rate remains low, she will     probably need a cardiac evaluation for possible sinus node dysfunction,     but we will reserve this for tomorrow and further evaluation.  2. Coronary artery disease.  Continue Zocor.  Restart her antihypertensive     medications eventually.  3. Diabetes mellitus.  Continue her ADA diet and Avandamet.  She is not well     controlled by her own admission. 4. Obesity.                                                 Annita Brod, M.D.    SKK/MEDQ  D:  07/11/2003  T:  07/11/2003  Job:  [id number]   cc:   W. Doristine Church., M.D.  [id number], [place]  [place] [address]  Fax: [phone]   Biagio Borg, M.D.  [address] [place]  [place]  [place] [address]  Fax: [phone]

## 2010-07-26 NOTE — Op Note (Signed)
NAME:  Sandy Roy, Sandy Roy NO.:  [id number]   MEDICAL RECORD NO.:  [id number]          PATIENT TYPE:  AMB   LOCATION:  ENDO                         [facility]ACILITY[facility]  [facility]   PHYSICIAN:  Wonda Horner, M.D.   DATE OF BIRTH:  17-Feb-1951   DATE OF PROCEDURE:  07/01/2004  DATE OF DISCHARGE:                                 OPERATIVE REPORT   PROCEDURE:  Colonoscopy with biopsy.   INDICATIONS FOR PROCEDURE:  Screening.   Informed consent was obtained after explanation of the risks of bleeding,  infection, and perforation.   PREMEDICATIONS:  Fentanyl 125 mcg IV, Versed 12 mg IV.   PROCEDURE:  With the patient in the left lateral decubitus position, a  rectal exam was performed.  No masses were felt.  The Olympus colonoscope  was inserted into the rectum and advanced around the colon to the cecum.  Cecal landmarks were identified.  The cecum revealed a prominent ileocecal  valve.  This was biopsied.  The ascending colon looked normal.  The  transverse colon looked normal.  The descending colon looked normal.  In the  sigmoid, there was a small 3 mm polyp biopsied off with cold forceps.  The  rectum looked normal.  She tolerated the procedure well without  complications.   IMPRESSION:  1.  Small sigmoid polyp.  2.  Prominent ileocecal valve, which was biopsied.   PLAN:  The pathology will be checked.      SFG/MEDQ  D:  07/01/2004  T:  07/01/2004  Job:  [id number]   cc:   Biagio Borg, M.D.  [address], [place]  [place]  [place] [address]  Fax: [phone]

## 2010-07-26 NOTE — Op Note (Signed)
[facility]  Patient:    MILDA, TOWLES Visit Number: [id number] MRN: [id number]          Service Type: DSU Location: [facility] Attending Physician:  Erick Blinks Proc. Date: 03/16/00 Admit Date:  03/16/2000   CC:         Biagio Borg, M.D.   Operative Report  PREOPERATIVE DIAGNOSIS:  Soft tissue mass, left back.  POSTOPERATIVE DIAGNOSIS:  A 2 cm soft tissue mass of the left back.  OPERATION:  Excision of 2 cm soft tissue mass from the left back.  SURGEON:  Odis Hollingshead, M.D.  ANESTHESIA:  General  INDICATION:  The patient is a 60 year old female who has been noting a painful knot in her left back.  It is becoming increasingly symptomatic.  She now presents for excision of it.  DESCRIPTION OF PROCEDURE:  She is placed supine on the stretcher and a general anesthetic was administered.  She was then turned prone on the operating table and pads were placed in appropriate areas.  The left mid to lower back was sterilely prepped and draped.  The mass had been marked preoperatively.  Local anesthetic was infiltrated directly over the mass.  An incision was made directly over the mass and carried down to the subcutaneous fat.  The mass was very firm and fixed.  Using sharp dissection, I excised the mass that measured approximately 2 cm and appeared to be mostly lipomatous in nature.  Bleeding points were then controlled with electrocautery.  Once hemostasis was adequate, the skin was closed with a 4-0 Monocryl subcuticular stitch. Steri-Strips and sterile dressings were applied.  She tolerated the procedure well without any apparent complications.  She was taken to the recovery room in satisfactory condition. Attending Physician:  Erick Blinks DD:  03/16/00 TD:  03/16/00 Job: [id number]

## 2010-07-26 NOTE — H&P (Signed)
NAME:  Sandy Roy, Sandy Roy                       ACCOUNT NO.:  [id number]   MEDICAL RECORD NO.:  [id number]                   PATIENT TYPE:  INP   LOCATION:  [id number]                                 FACILITY:  [facility]   PHYSICIAN:  Kerry Hough., M.D.         DATE OF BIRTH:  07-17-1950   DATE OF ADMISSION:  04/24/2003  DATE OF DISCHARGE:                                HISTORY & PHYSICAL   CHIEF COMPLAINT:  Chest discomfort.   HISTORY OF PRESENT ILLNESS:  The patient is a 60 year old female admitted to  rule out myocardial infarction and in transfer from [facility]. The patient has a prior history of coronary artery disease with a  prior history of an occluded right coronary artery diagnosed in 1997.  She  has had hypertension and diabetes as well as obesity as well as  hyperlipidemia.  She had some atypical chest pain recently and had a  Cardiolite scan done in December that did not show any severe ischemia, but  did have a prior inferior defect.  She had been out walking and exercising  and had been feeling well.  Yesterday, she developed increasing dyspnea and  developed midsternal tightness that lasted throughout the afternoon.  After  about three hours of discomfort, her daughter was called and she had  increasing dyspnea and eventually the ambulance was called and gave her  nitroglycerin with some relief and she was taken to [facility] where she was admitted.  EKG was unremarkable.  She had some  diaphoresis at the time and did have relief with nitroglycerin.  She did  develop some anterior biphasic T waves and her dyspnea seemed to improve.  I  was asked to evaluate her and she was transferred from [facility] for further evaluation.   PAST MEDICAL HISTORY:  Remarkable for hypertension, diabetes mellitus,  hyperlipidemia, obesity, history of depression, and a history of a goiter.   PAST SURGICAL HISTORY:  Oophorectomy,  cholecystectomy, and laparoscopy.  Removal of breast cysts.  Removal of nodular fasciitis of the leg in 2001.   ALLERGIES:  No known drug allergies.   CURRENT MEDICATIONS:  1. Aspirin 325 mg daily.  2. Atenolol 50 mg daily.  3. Avandamet 2/100 daily.  4. Cardizem CD 360 mg daily.  5. Diclofenac 75 mg daily.  6. Lasix 20 mg daily.  7. Maxzide 25 mg daily.  8. Prinivil 20 mg daily.  9. Triazolam 0.25 mg two tablets q.h.s.  10.      _________ 10 mg daily.  11.      Zocor 20 mg daily.   FAMILY HISTORY:  Recorded in old records is reviewed and unchanged.   SOCIAL HISTORY:  She is a native of [place] and moved to [place],  grew up in [place], and has lived here for the past several years.  She has  worked for [employer] for  34 years.  She is widowed.  Her husband died  of a massive heart attack. She has two daughters, one lives in [place] and one lives in [place].  She used to smoke, but quit in 2004.  She  drinks occasional alcohol.  Attends [employer].   REVIEW OF SYSTEMS:  She has been obese most of her adult life. She denies  any diabetic retinopathy, diplopia or history of glaucoma.  She denies any  cough recently, but does have some dyspnea recently. She has a history of  abdominal bloating and a history of reflux. She has significant arthritis of  the fingers.  She has some difficulty sleeping.  She has a prior history of  depression requiring treatment and has been under psychiatric care.  Also a  history of situational stress and anxiety.  She has diabetes, __________.  She has a history of stroke with TIA.  Other as above, the remainder of  review of systems is unremarkable.   PHYSICAL EXAMINATION:  GENERAL:  She is an obese white female who is  currently in no acute distress.  VITAL SIGNS:  Blood pressure was 140/80, pulse 70.  SKIN:  Warm and dry.  HEENT:  EOMI.  PERRLA.  CNS clear.  Fundi not examined.  Pharynx negative.  NECK:   Supple without masses, thyromegaly or bruits.  LUNGS:  Clear to A&P.  HEART:  Normal S1 and S2, no S3.  ABDOMEN:  Soft and nontender.  Femoral distal pulses are 2+.  NEUROLOGY:  Normal.   12-lead EKG from [facility] shows biphasic anterior T waves.   LABORATORY DATA:  CPK 104.  Sodium 137, potassium 3.7, glucose 140, BUN 16,  creatinine 0.7.  Chest x-ray reportedly shows no acute disease. Hemoglobin  11.5, hematocrit 44.2, white count 14,200. B-natruretic peptide is 727.   IMPRESSION:  1. Chest discomfort with abnormal troponin ________ rule out brain     natruretic peptide.  Rule out myocardial ischemia or unstable angina.  2. Coronary artery disease with previous known occlusion of the right     coronary artery in 1997.  3. Type 2 diabetes mellitus.  4. Hypertensive heart disease.  5. Hyperlipidemia under treatment.  6. Obesity.  7. History of depression.   RECOMMENDATIONS:  The patient is admitted to rule out an MI.  Serial CPK's  and EKG's have been done.  She will undergo a cardiac catheterization  tomorrow. The procedure was discussed with the patient including risks of  myocardial infarction, death, stroke, bleeding, arrhythmia, dye allergy, or  renal failure and she understands and is willing to proceed.  We also  discussed the possibility of stenting or emergency bypass grafting as well  as emergency surgery.  She understands and is willing to proceed.                                                Kerry Hough., M.D.    WST/MEDQ  D:  04/24/2003  T:  04/24/2003  Job:  [id number]   cc:   Biagio Borg, M.D.  [address] [place]  [place]  [place] [address]  Fax: [phone]

## 2010-07-26 NOTE — Cardiovascular Report (Signed)
NAME:  Sandy Roy, Sandy Roy                       ACCOUNT NO.:  [id number]   MEDICAL RECORD NO.:  [id number]                   PATIENT TYPE:  INP   LOCATION:  [id number]                                 FACILITY:  [facility]   PHYSICIAN:  W. Doristine Church., M.D.         DATE OF BIRTH:  1950/03/12   DATE OF PROCEDURE:  04/26/2003  DATE OF DISCHARGE:                              CARDIAC CATHETERIZATION   HISTORY:  A 60 year old black female with hypertension, hyperlipidemia, and  diabetes.  She has a known history of coronary artery disease with an  occluded right coronary artery in 1997.  She recently had some chest  discomfort and had a Cardiolite scan done in December that did not show  significant ischemia but a prior inferior defect.  She developed increasing  dyspnea and midsternal tightness that lasted through the afternoon and  presented to [facility].  At the time, she had diaphoresis  and relief with nitroglycerin.  She developed anterior T-wave changes  compatible with ischemia and was admitted to the hospital.  She was  transferred here for further evaluation.   COMMENTS ABOUT PROCEDURE:  The patient tolerated the procedure well without  complications.  Following the procedure, she had good hemostasis and good  peripheral pulses noted.   HEMODYNAMIC DATA:  Aorta post contrast 117/70, LV post contrast 117/4-25.   ANGIOGRAPHIC DATA:  LEFT VENTRICULOGRAM:  Performed in the 30-degree RAO  projection.  The aortic valve is normal.  The mitral valve is normal.  There  is moderate hypokinesis of the mid and distal anteroapical segment.  The  estimated ejection fraction is mildly reduced and estimated at 40-45%.  Coronary arteries arise and distribute normally.  There is no significant  coronary calcification present.   1. LEFT MAIN CORONARY ARTERY:  The left main coronary artery is normal.  2. LEFT ANTERIOR DESCENDING:  Very tortuous.  The proximal left anterior  descending is widely patent with no severe focal obstructive stenoses     noted.  Beginning in the mid portion of the vessel is an area of     significant tortuosity and the vessel is diffusely narrowed all the way     to the apex.  It is still widely patent with no severe focal obstructive     stenoses noted.  3. CIRCUMFLEX:  The circumflex has an intermediate branch that arises and     contains no significant stenoses.  The circumflex marginal branch has a     large first marginal branch, a smaller second marginal branch, and a     third marginal branch that are tortuous, but with no significant stenoses     noted.  4. RIGHT CORONARY ARTERY:  The right coronary artery is occluded proximally.     The distal vessel fills through the collaterals in the left coronary     system.   IMPRESSION:  1. Abnormal left ventricular function with anteroapical defect compatible  with a previous infarction.  2. Diffusely narrowed and small distal LAD but no significant focal     obstructive stenoses noted.  3. Occlusion of the right coronary artery with collaterals from the left     coronary system.   RECOMMENDATIONS:  1. Treat intensively for spasm.  2. Initiate Plavix therapy.  3. Cardiac rehab.                                               Kerry Hough., M.D.    WST/MEDQ  D:  04/26/2003  T:  04/26/2003  Job:  [id number]   cc:   Biagio Borg, M.D.  [address] [place]  [place]  [place] [address]  Fax: [phone]

## 2010-07-26 NOTE — Consult Note (Signed)
NAME:  Sandy Roy, SCHEIBNER NO.:  [id number]   MEDICAL RECORD NO.:  [id number]                   PATIENT TYPE:  OBV   LOCATION:  4711                                 FACILITY:  [facility]   PHYSICIAN:  Kerry Hough., M.D.         DATE OF BIRTH:  [birth year]   DATE OF CONSULTATION:  07/12/2003  DATE OF DISCHARGE:                                   CONSULTATION   CONSULTING PHYSICIAN:  Ezzard Standing, M.D.   REFERRING PHYSICIAN:  Biagio Borg, M.D.   REASON FOR CONSULTATION:  Thank you for asking me to see this 60 year old  black female for cardiologic evaluation of pre-syncope and bradycardia.   HISTORY OF PRESENT ILLNESS:  The patient has a prior history of coronary  artery disease, hypertension, diabetes and obesity.  She was hospitalized in  February after a prolonged episode of chest discomfort when she was admitted  to [facility] with a slightly elevated Troponin.  She had  some anterior T wave inversions develop and underwent cardiac  catheterization after being transferred here with findings of mildly  depressed left ventricular function with an ejection fraction of 40 to 45%  with hypokinesis of the mid and distal anteroapical segment.  The left  anterior descending was very tortuous and was diffusely narrowed and small  in the mid portion to the apex, but no severe focal obstructive stenoses  were noted.  The circumflex had no significant disease.  The right coronary  artery was previously occluded and filled by collaterals.  She was treated  intensively for coronary artery spasm with the addition of Diltiazem to her  regimen.  She previously had complained of significant edema on Norvasc.  She had been doing well and had not had recurrent chest discomfort or  shortness of breath.  Her blood pressure had been well controlled.  She was  hospitalized yesterday when at work she developed increasing fatigue and  weakness  and became lightheaded and had to sit down.  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  She was transported to the emergency room where her blood  pressure and pulse have remained stable with a heart rate of 50.  Reportedly, the blood pressure was in the 70's at work, but en route was  stable in the 120's.  EKG showed sinus rhythm with first degree AV block  with marked sinus bradycardia.  She improved and was feeling better.  After  admission she had a set of serial cardiac enzymes done that showed no  evidence of myocardial ischemia.  She currently feels well right now and the  Diltiazem has been discontinued and her pulse and her blood pressure have  remained stable.  EKG has shown improvement in the previous anterior T wave  changes since the previous visit.   PAST MEDICAL HISTORY:  Recorded  in the previous recorded dictated record in  February and is unchanged.   REVIEW OF SYMPTOMS:  There have been no other changes in her review of  systems recently.   PHYSICAL EXAMINATION:  GENERAL:  She is a pleasant, obese black female who  is currently in no acute distress.  VITAL SIGNS:  Blood pressure is currently 120/70, pulse is currently 55.  SKIN: Warm and dry.  HEENT:  EOMI.  PERRLA.  CNS clear.  The pharynx is negative.  The neck is  supple without masses, JVD, thyromegaly or bruit.  LUNGS:  Clear to auscultation and percussion.  CARDIAC EXAM:  Normal S1 and S2.  No S3.  ABDOMEN:  Soft and non-tender.  Femoral and distal pulses are 2+ and no  edema.   EKG shows sinus bradycardia, first degree AV block and nonspecific ST  changes.   LABORATORY DATA:  CPK and Troponin were negative.  B-natriuretic peptide is  not drawn.   IMPRESSION:  1. Bradycardia and hypotension which likely was due to a combination of     Diltiazem plus Atenolol.  2. Coronary artery disease with occlusion of the right coronary artery,      diffuse disease and small vessel disease involving the left anterior     descending.  3. Hypertension.  4. Diabetes.   RECOMMENDATIONS:  The patient has continued to abstain from cigarette  smoking.  At the present time, I would stop the Diltiazem and continue her  on Imdur as well as her Atenolol and ACE inhibitors.  If her blood pressure  remains stable overnight, I would plan to let her go home in the morning and  I will follow her up in one week.  Thank you for asking me to see her with  you.                                               Kerry Hough., M.D.    WST/MEDQ  D:  07/12/2003  T:  07/12/2003  Job:  [id number]   cc:   Biagio Borg, M.D.  [address] [place]  [place]  [place] [address]  Fax: [phone]

## 2010-12-20 LAB — HEMOGLOBIN A1C: Mean Plasma Glucose: 232

## 2010-12-20 LAB — CALCIUM
Calcium: 8.8
Calcium: 8.9

## 2010-12-23 LAB — CBC
HCT: 36.7
MCHC: 34.2
MCV: 88.9
Platelets: 406 — ABNORMAL HIGH
RBC: 4.13

## 2010-12-23 LAB — BASIC METABOLIC PANEL
BUN: 29 — ABNORMAL HIGH
CO2: 32
Chloride: 94 — ABNORMAL LOW
Creatinine, Ser: 1.13
Potassium: 3.7

## 2011-03-13 ENCOUNTER — Ambulatory Visit: Payer: 59 | Admitting: Endocrinology

## 2011-03-17 ENCOUNTER — Ambulatory Visit (INDEPENDENT_AMBULATORY_CARE_PROVIDER_SITE_OTHER): Payer: 59 | Admitting: Endocrinology

## 2011-03-17 ENCOUNTER — Encounter: Payer: Self-pay | Admitting: Endocrinology

## 2011-03-17 VITALS — BP 148/82 | HR 53 | Temp 98.4°F | Ht 62.5 in | Wt 180.8 lb

## 2011-03-17 DIAGNOSIS — C73 Malignant neoplasm of thyroid gland: Secondary | ICD-10-CM

## 2011-03-17 MED ORDER — LEVOTHYROXINE SODIUM 112 MCG PO TABS: 112.0000 ug | ORAL_TABLET | Freq: Every day | ORAL | Status: DC

## 2011-03-17 NOTE — Patient Instructions (Addendum)
Reduce levothyroxine to 112 mcg/day.  i have sent a prescription to your pharmacy.   Please come back for a follow-up appointment in 6 months.   You should consider changing the calcitriol to "forteo" (injection).  This is a better way to keep the calcium normal, but the insurance may not pay for it.

## 2011-03-17 NOTE — Progress Notes (Signed)
Subjective:    Patient ID: Sandy Roy, female    DOB: [birth year], 61 y.o.   MRN: [id number]  HPI the status of at least 3 ongoing medical problems is addressed today: postsurgical hypoparathyroidism:  no cramps postsurgical hypothyroidism:  She has lost a few lbs, due to her efforts.   pt had thyroidectomy for 2 small foci of papillary cancer.  foci were not large enough to warrant i-131 rx.  she does not notice any nodule at the neck.   Past Medical History  Diagnosis Date  . CONGESTIVE HEART FAILURE 12/14/2006  . CORONARY ARTERY DISEASE 12/14/2006  . DIABETES MELLITUS, TYPE II 12/14/2006  . HYPERLIPIDEMIA 12/14/2006  . HYPERTENSION 12/14/2006  . Hypoparathyroidism 12/21/2006  . Hypopotassemia 01/13/2007  . HYPOTHYROIDISM, POSTSURGICAL 12/15/2006  . NEOPLASM, MALIGNANT, THYROID GLAND 12/15/2006    Stage 1 papillary adenocarcinoma, one very small focus each lobe (92mm, 31mm)  6/09: CT neck: no evidence of recurrence  . OBSTRUCTIVE SLEEP APNEA 02/23/2007    Past Surgical History  Procedure Date  . Thyroidectomy 2008  . Cholecystectomy     History   Social History  . Marital Status: Widowed    Spouse Name: N/A    Number of Children: N/A  . Years of Education: N/A   Occupational History  . Not on file.   Social History Main Topics  . Smoking status: Former Research scientist (life sciences)  . Smokeless tobacco: Not on file  . Alcohol Use: Not on file  . Drug Use: Not on file  . Sexually Active: Not on file   Other Topics Concern  . Not on file   Social History Narrative  . No narrative on file    Current Outpatient Prescriptions on File Prior to Visit  Medication Sig Dispense Refill  . aspirin (BAYER LOW STRENGTH) 81 MG EC tablet Take 81 mg by mouth daily.        Marland Kitchen atenolol (TENORMIN) 50 MG tablet Take 50 mg by mouth daily.        . calcitRIOL (ROCALTROL) 0.25 MCG capsule Take 0.25 mcg by mouth daily.        . citalopram (CELEXA) 40 MG tablet Take 40 mg by mouth daily.        . fish  oil-omega-3 fatty acids 1000 MG capsule Take 1 capsule by mouth daily.        . hydrochlorothiazide (HYDRODIURIL) 25 MG tablet Take 1/2 tablet by mouth once daily       . lisinopril (PRINIVIL,ZESTRIL) 20 MG tablet Take 20 mg by mouth daily.        . metFORMIN (GLUCOPHAGE) 1000 MG tablet Take 1,000 mg by mouth 2 (two) times daily.        . triazolam (HALCION) 0.25 MG tablet 2 tablets by mouth at bedtime         No Known Allergies  No family history on file.  BP 148/82  Pulse 53  Temp(Src) 98.4 F (36.9 C) (Oral)  Ht 5' 2.5" (1.588 m)  Wt 180 lb 12.8 oz (82.01 kg)  BMI 32.54 kg/m2  SpO2 97%  Review of Systems She has fatigue.  Denies numbness.      Objective:   Physical Exam VITAL SIGNS:  See vs page GENERAL: no distress Neck: a healed scar is present.  i do not appreciate a nodule in the thyroid or elsewhere in the neck.     outside test results are reviewed:  TSH=0.27    Assessment & Plan:  H/o small focus  of thyroid cancer.  No f/u is needed, except physical exams Hypoparathyroidism.  This would be better rx'ed wth forteo, but ins will not pay Postsurgical hypothyroidism.  She needs a slightly decreased dosage of synthroid.

## 2011-09-02 ENCOUNTER — Ambulatory Visit: Payer: 59 | Admitting: Endocrinology

## 2011-10-02 ENCOUNTER — Other Ambulatory Visit (INDEPENDENT_AMBULATORY_CARE_PROVIDER_SITE_OTHER): Payer: 59

## 2011-10-02 ENCOUNTER — Ambulatory Visit (INDEPENDENT_AMBULATORY_CARE_PROVIDER_SITE_OTHER): Payer: 59 | Admitting: Endocrinology

## 2011-10-02 ENCOUNTER — Encounter: Payer: Self-pay | Admitting: Endocrinology

## 2011-10-02 ENCOUNTER — Ambulatory Visit: Payer: 59 | Admitting: Endocrinology

## 2011-10-02 VITALS — BP 130/90 | HR 57 | Temp 97.4°F | Ht 62.5 in | Wt 182.0 lb

## 2011-10-02 DIAGNOSIS — E890 Postprocedural hypothyroidism: Secondary | ICD-10-CM

## 2011-10-02 DIAGNOSIS — E209 Hypoparathyroidism, unspecified: Secondary | ICD-10-CM

## 2011-10-02 LAB — MAGNESIUM: Magnesium: 1.8 mg/dL (ref 1.5–2.5)

## 2011-10-02 NOTE — Patient Instructions (Addendum)
blood tests are being requested for you today.  You will receive a letter with results.   Please come back for a follow-up appointment in 6 months. 

## 2011-10-02 NOTE — Progress Notes (Signed)
Subjective:    Patient ID: Sandy Roy, female    DOB: Feb 26, 1951, 61 y.o.   MRN: [id number]  HPI the status of at least 3 ongoing medical problems is addressed today: postsurgical hypoparathyroidism:  no cramps postsurgical hypothyroidism:  She has lost a few lbs, due to her efforts.   pt had thyroidectomy for 2 small foci of papillary cancer.  foci were not large enough to warrant i-131 rx. she does not notice any nodule at the neck.  Past Medical History  Diagnosis Date  . CONGESTIVE HEART FAILURE 12/14/2006  . CORONARY ARTERY DISEASE 12/14/2006  . DIABETES MELLITUS, TYPE II 12/14/2006  . HYPERLIPIDEMIA 12/14/2006  . HYPERTENSION 12/14/2006  . Hypoparathyroidism 12/21/2006  . Hypopotassemia 01/13/2007  . HYPOTHYROIDISM, POSTSURGICAL 12/15/2006  . NEOPLASM, MALIGNANT, THYROID GLAND 12/15/2006    Stage 1 papillary adenocarcinoma, one very small focus each lobe (8mm, 23mm)  6/09: CT neck: no evidence of recurrence  . OBSTRUCTIVE SLEEP APNEA 02/23/2007    Past Surgical History  Procedure Date  . Thyroidectomy 2008  . Cholecystectomy     History   Social History  . Marital Status: Widowed    Spouse Name: N/A    Number of Children: N/A  . Years of Education: N/A   Occupational History  . Not on file.   Social History Main Topics  . Smoking status: Former Research scientist (life sciences)  . Smokeless tobacco: Not on file  . Alcohol Use: Not on file  . Drug Use: Not on file  . Sexually Active: Not on file   Other Topics Concern  . Not on file   Social History Narrative  . No narrative on file    Current Outpatient Prescriptions on File Prior to Visit  Medication Sig Dispense Refill  . aspirin (BAYER LOW STRENGTH) 81 MG EC tablet Take 81 mg by mouth daily.        Marland Kitchen atenolol (TENORMIN) 50 MG tablet Take 50 mg by mouth daily.        . calcitRIOL (ROCALTROL) 0.25 MCG capsule Take 0.5 mcg by mouth daily.       . citalopram (CELEXA) 40 MG tablet Take 20 mg by mouth daily.       . fish oil-omega-3  fatty acids 1000 MG capsule Take 1 capsule by mouth daily.        Marland Kitchen levothyroxine (SYNTHROID, LEVOTHROID) 112 MCG tablet Take 1 tablet (112 mcg total) by mouth daily.  90 tablet  3  . lisinopril (PRINIVIL,ZESTRIL) 20 MG tablet Take 20 mg by mouth daily.        . triazolam (HALCION) 0.25 MG tablet 2 tablets by mouth at bedtime         No Known Allergies  No family history on file.  BP 130/90  Pulse 57  Temp 97.4 F (36.3 C) (Oral)  Ht 5' 2.5" (1.588 m)  Wt 182 lb (82.555 kg)  BMI 32.76 kg/m2  SpO2 99%    Review of Systems She has a few lbs of weight gain.  Denies neck pain    Objective:   Physical Exam VITAL SIGNS:  See vs page GENERAL: no distress Neck: a healed scar is present.  i do not appreciate a nodule in the thyroid or elsewhere in the neck  Lab Results  Component Value Date   PTH 5.1* 10/02/2011   CALCIUM 8.7 10/02/2011   PHOS 5.4* 12/21/2006   Lab Results  Component Value Date   TSH 0.56 10/02/2011  Assessment & Plan:  Postsurgical hypothyroidism, well-replaced. Hypoparathyroidism, well-controlled H/o very small foci of papillary adenocarcinoma of the thyroid.  Very low risk of recurrence.

## 2011-10-03 LAB — PTH, INTACT AND CALCIUM: PTH: 5.1 pg/mL — ABNORMAL LOW (ref 14.0–72.0)

## 2011-10-05 ENCOUNTER — Encounter: Payer: Self-pay | Admitting: Endocrinology

## 2012-01-01 ENCOUNTER — Other Ambulatory Visit: Payer: Self-pay | Admitting: Nephrology

## 2012-01-01 DIAGNOSIS — N189 Chronic kidney disease, unspecified: Secondary | ICD-10-CM

## 2012-01-07 ENCOUNTER — Ambulatory Visit
Admission: RE | Admit: 2012-01-07 | Discharge: 2012-01-07 | Disposition: A | Discharge status: Home / Self Care | Payer: 59 | Source: Ambulatory Visit | Attending: Nephrology | Admitting: Nephrology

## 2012-01-07 DIAGNOSIS — N189 Chronic kidney disease, unspecified: Secondary | ICD-10-CM

## 2012-04-01 ENCOUNTER — Encounter: Payer: Self-pay | Admitting: Endocrinology

## 2012-04-01 ENCOUNTER — Ambulatory Visit (INDEPENDENT_AMBULATORY_CARE_PROVIDER_SITE_OTHER): Payer: 59 | Admitting: Endocrinology

## 2012-04-01 VITALS — BP 128/70 | HR 58 | Temp 97.8°F | Wt 184.0 lb

## 2012-04-01 DIAGNOSIS — E119 Type 2 diabetes mellitus without complications: Secondary | ICD-10-CM

## 2012-04-01 DIAGNOSIS — E890 Postprocedural hypothyroidism: Secondary | ICD-10-CM

## 2012-04-01 DIAGNOSIS — E209 Hypoparathyroidism, unspecified: Secondary | ICD-10-CM

## 2012-04-01 LAB — MICROALBUMIN / CREATININE URINE RATIO
Microalb Creat Ratio: 112.7 mg/g — ABNORMAL HIGH (ref 0.0–30.0)
Microalb, Ur: 82.1 mg/dL — ABNORMAL HIGH (ref 0.0–1.9)

## 2012-04-01 LAB — TSH: TSH: 0.11 u[IU]/mL — ABNORMAL LOW (ref 0.35–5.50)

## 2012-04-01 LAB — HEMOGLOBIN A1C: Hgb A1c MFr Bld: 8.9 % — ABNORMAL HIGH (ref 4.6–6.5)

## 2012-04-01 MED ORDER — BROMOCRIPTINE MESYLATE 2.5 MG PO TABS: 2.5000 mg | ORAL_TABLET | Freq: Every day | ORAL | Status: DC

## 2012-04-01 MED ORDER — LEVOTHYROXINE SODIUM 100 MCG PO TABS: 100.0000 ug | ORAL_TABLET | Freq: Every day | ORAL | Status: DC

## 2012-04-01 NOTE — Patient Instructions (Addendum)
blood tests are being requested for you today.  We'll contact you with results. Please come back for a follow-up appointment in 6 months. Here is a big bag of samples of "tradjenta."  Please find out what the alternative to this medication is, and i would be happy to change it.

## 2012-04-01 NOTE — Progress Notes (Signed)
Subjective:    Patient ID: Sandy Roy, female    DOB: Feb 05, 1951, 62 y.o.   MRN: [id number]  HPI the status of at least 3 ongoing medical problems is addressed today: type 2 DM (dx'ed 123XX123; complicated by CAD and renal insufficiency).  She says cbg's are well-controlled, but ins won't pay for tradjenta.  denies hypoglycemia.   postsurgical hypoparathyroidism:  no cramps.   postsurgical hypothyroidism:  Denies weight change.    pt had thyroidectomy for 2 small foci of papillary cancer (2008).  foci were not large enough to warrant i-131 rx. she does not notice any nodule at the neck.   Past Medical History  Diagnosis Date  . CONGESTIVE HEART FAILURE 12/14/2006  . CORONARY ARTERY DISEASE 12/14/2006  . DIABETES MELLITUS, TYPE II 12/14/2006  . HYPERLIPIDEMIA 12/14/2006  . HYPERTENSION 12/14/2006  . Hypoparathyroidism 12/21/2006  . Hypopotassemia 01/13/2007  . HYPOTHYROIDISM, POSTSURGICAL 12/15/2006  . NEOPLASM, MALIGNANT, THYROID GLAND 12/15/2006    Stage 1 papillary adenocarcinoma, one very small focus each lobe (55mm, 37mm)  6/09: CT neck: no evidence of recurrence  . OBSTRUCTIVE SLEEP APNEA 02/23/2007    Past Surgical History  Procedure Date  . Thyroidectomy 2008  . Cholecystectomy     History   Social History  . Marital Status: Widowed    Spouse Name: N/A    Number of Children: N/A  . Years of Education: N/A   Occupational History  . Not on file.   Social History Main Topics  . Smoking status: Former Research scientist (life sciences)  . Smokeless tobacco: Not on file  . Alcohol Use: Not on file  . Drug Use: Not on file  . Sexually Active: Not on file   Other Topics Concern  . Not on file   Social History Narrative  . No narrative on file    Current Outpatient Prescriptions on File Prior to Visit  Medication Sig Dispense Refill  . cloNIDine (CATAPRES) 0.2 MG tablet Take 0.2 mg by mouth 2 (two) times daily.      . furosemide (LASIX) 40 MG tablet Take 40 mg by mouth daily.      Marland Kitchen  linagliptin (TRADJENTA) 5 MG TABS tablet Take 5 mg by mouth daily.      Marland Kitchen aspirin (BAYER LOW STRENGTH) 81 MG EC tablet Take 81 mg by mouth daily.        Marland Kitchen atenolol (TENORMIN) 50 MG tablet Take 50 mg by mouth daily.        . bromocriptine (PARLODEL) 2.5 MG tablet Take 1 tablet (2.5 mg total) by mouth daily.  30 tablet  11  . calcitRIOL (ROCALTROL) 0.25 MCG capsule Take 0.5 mcg by mouth daily.       . citalopram (CELEXA) 40 MG tablet Take 20 mg by mouth daily.       . fish oil-omega-3 fatty acids 1000 MG capsule Take 1 capsule by mouth daily.        Marland Kitchen levothyroxine (SYNTHROID, LEVOTHROID) 112 MCG tablet Take 1 tablet (112 mcg total) by mouth daily.  90 tablet  3  . lisinopril (PRINIVIL,ZESTRIL) 20 MG tablet Take 20 mg by mouth daily.        . triazolam (HALCION) 0.25 MG tablet 2 tablets by mouth at bedtime         No Known Allergies  No family history on file.  BP 128/70  Pulse 58  Temp 97.8 F (36.6 C) (Oral)  Wt 184 lb (83.462 kg)  SpO2 98%  Review of Systems  Denies chest pain and sob    Objective:   Physical Exam VITAL SIGNS:  See vs page GENERAL: no distress Pulses: dorsalis pedis intact bilat.   Feet: no deformity.  no ulcer on the feet.  feet are of normal color and temp.  no edema Neuro: sensation is intact to touch on the feet.   Lab Results  Component Value Date   HGBA1C 8.9* 04/01/2012   Lab Results  Component Value Date   PTH 11.8* 04/01/2012   CALCIUM 9.5 04/01/2012   PHOS 5.4* 12/21/2006   Lab Results  Component Value Date   TSH 0.11* 04/01/2012      Assessment & Plan:  DM: needs increased rx Hypoparathyroidism, well-controlled Hypothyroidism: slightly overreplaced

## 2012-04-02 LAB — PTH, INTACT AND CALCIUM: PTH: 11.8 pg/mL — ABNORMAL LOW (ref 14.0–72.0)

## 2012-08-31 ENCOUNTER — Encounter: Payer: Self-pay | Admitting: Endocrinology

## 2012-08-31 ENCOUNTER — Ambulatory Visit (INDEPENDENT_AMBULATORY_CARE_PROVIDER_SITE_OTHER): Payer: 59 | Admitting: Endocrinology

## 2012-08-31 VITALS — BP 148/88 | HR 77 | Temp 99.0°F | Resp 10 | Ht 62.5 in | Wt 178.0 lb

## 2012-08-31 DIAGNOSIS — E209 Hypoparathyroidism, unspecified: Secondary | ICD-10-CM

## 2012-08-31 DIAGNOSIS — E119 Type 2 diabetes mellitus without complications: Secondary | ICD-10-CM

## 2012-08-31 DIAGNOSIS — E890 Postprocedural hypothyroidism: Secondary | ICD-10-CM

## 2012-08-31 LAB — BASIC METABOLIC PANEL
CO2: 26 mEq/L (ref 19–32)
Chloride: 95 mEq/L — ABNORMAL LOW (ref 96–112)
Creatinine, Ser: 2.8 mg/dL — ABNORMAL HIGH (ref 0.4–1.2)
Sodium: 138 mEq/L (ref 135–145)

## 2012-08-31 LAB — HEMOGLOBIN A1C: Hgb A1c MFr Bld: 11 % — ABNORMAL HIGH (ref 4.6–6.5)

## 2012-08-31 LAB — TSH: TSH: 2.54 u[IU]/mL (ref 0.35–5.50)

## 2012-08-31 MED ORDER — BROMOCRIPTINE MESYLATE 2.5 MG PO TABS: 2.5000 mg | ORAL_TABLET | Freq: Every day | ORAL | Status: DC

## 2012-08-31 MED ORDER — SITAGLIPTIN PHOSPHATE 100 MG PO TABS: 100.0000 mg | ORAL_TABLET | Freq: Every day | ORAL | Status: DC

## 2012-08-31 NOTE — Patient Instructions (Addendum)
blood tests are being requested for you today.  We'll contact you with results.   Please come back for a follow-up appointment in 3 months.   Please change tradjenta to [place].   Also, Please start taking "bromocriptine," to help your blood sugar. It has possible side effects of nausea and dizziness.  These go away with time.  You can avoid these by taking it at bedtime, and by taking just take 1/2 pill for the first week.

## 2012-08-31 NOTE — Progress Notes (Signed)
Subjective:    Patient ID: Sandy Roy, female    DOB: 01/12/1951, 62 y.o.   MRN: [id number]  HPI The state of at least three ongoing medical problems is addressed today, with interval history of each noted here: type 2 DM (dx'ed 123XX123; complicated by CAD and renal insufficiency).  She says cbg's are well-controlled, but ins won't pay for tradjenta.  She denies hypoglycemia.  She was rx'ed parlodel, but did not get it from the pharmacy.   postsurgical hypoparathyroidism:  no cramps.   postsurgical hypothyroidism:  Denies weight change.    pt had thyroidectomy for 2 small foci of papillary cancer (2008).  foci were not large enough to warrant i-131 rx. she does not notice any nodule at the neck.   Past Medical History  Diagnosis Date  . CONGESTIVE HEART FAILURE 12/14/2006  . CORONARY ARTERY DISEASE 12/14/2006  . DIABETES MELLITUS, TYPE II 12/14/2006  . HYPERLIPIDEMIA 12/14/2006  . HYPERTENSION 12/14/2006  . Hypoparathyroidism 12/21/2006  . Hypopotassemia 01/13/2007  . HYPOTHYROIDISM, POSTSURGICAL 12/15/2006  . NEOPLASM, MALIGNANT, THYROID GLAND 12/15/2006    Stage 1 papillary adenocarcinoma, one very small focus each lobe (23mm, 69mm)  6/09: CT neck: no evidence of recurrence  . OBSTRUCTIVE SLEEP APNEA 02/23/2007  . Chronic kidney disease     Past Surgical History  Procedure Laterality Date  . Thyroidectomy  2008  . Cholecystectomy      History   Social History  . Marital Status: Widowed    Spouse Name: N/A    Number of Children: N/A  . Years of Education: N/A   Occupational History  . Not on file.   Social History Main Topics  . Smoking status: Former Research scientist (life sciences)  . Smokeless tobacco: Not on file  . Alcohol Use: Not on file  . Drug Use: Not on file  . Sexually Active: Not on file   Other Topics Concern  . Not on file   Social History Narrative  . No narrative on file    Current Outpatient Prescriptions on File Prior to Visit  Medication Sig Dispense Refill  . aspirin  (BAYER LOW STRENGTH) 81 MG EC tablet Take 81 mg by mouth daily.        . cloNIDine (CATAPRES) 0.2 MG tablet Take 0.2 mg by mouth 2 (two) times daily.      . fish oil-omega-3 fatty acids 1000 MG capsule Take 1 capsule by mouth daily.        . furosemide (LASIX) 40 MG tablet Take 40 mg by mouth daily.      Marland Kitchen levothyroxine (SYNTHROID, LEVOTHROID) 100 MCG tablet Take 1 tablet (100 mcg total) by mouth daily.  30 tablet  11  . lisinopril (PRINIVIL,ZESTRIL) 20 MG tablet Take 20 mg by mouth 2 (two) times daily.       . triazolam (HALCION) 0.25 MG tablet 2 tablets by mouth at bedtime       . atenolol (TENORMIN) 50 MG tablet Take 50 mg by mouth daily.        . calcitRIOL (ROCALTROL) 0.25 MCG capsule Take 0.5 mcg by mouth daily.       . citalopram (CELEXA) 40 MG tablet Take 20 mg by mouth daily.        No current facility-administered medications on file prior to visit.    No Known Allergies  History reviewed. No pertinent family history.  BP 148/88  Pulse 77  Temp(Src) 99 F (37.2 C) (Oral)  Resp 10  Ht 5' 2.5" (1.588  m)  Wt 178 lb (80.74 kg)  BMI 32.02 kg/m2  SpO2 97%  Review of Systems Denies weight change and cramps.      Objective:   Physical Exam VITAL SIGNS:  See vs page GENERAL: no distress   Lab Results  Component Value Date   HGBA1C 11.0* 08/31/2012   Lab Results  Component Value Date   TSH 2.54 08/31/2012   Lab Results  Component Value Date   PTH 11.8* 04/01/2012   CALCIUM 7.3* 08/31/2012   PHOS 5.4* 12/21/2006      Assessment & Plan:  DM: control is much worse Hypoparathyroidism, needs increased rx Postsurgical hypothyroidism, well-replaced

## 2012-09-01 ENCOUNTER — Telehealth: Payer: Self-pay | Admitting: Endocrinology

## 2012-09-01 LAB — PTH, INTACT AND CALCIUM
Calcium: 7.4 mg/dL — ABNORMAL LOW (ref 8.4–10.5)
PTH: 15.5 pg/mL (ref 14.0–72.0)

## 2012-09-01 NOTE — Telephone Encounter (Signed)
please call patient: Calcium is low Please verify you take the calcitriol 2x0.25 mcg/day.

## 2012-09-21 MED ORDER — CALCITRIOL 0.25 MCG PO CAPS: 0.5000 ug | ORAL_CAPSULE | Freq: Every day | ORAL | Status: DC

## 2012-09-21 NOTE — Addendum Note (Signed)
Addended by: Rockwell [place] on: 09/21/2012 11:44 AM   Modules accepted: Orders

## 2012-09-21 NOTE — Telephone Encounter (Signed)
Pt has not been taking Calcitrol, did not have Rx, sent to [facility] per pt request; pt will continue Calcium supplements 1000 mg QD until Rx arrives/SLS

## 2012-09-27 ENCOUNTER — Ambulatory Visit: Payer: 59 | Admitting: Endocrinology

## 2012-11-23 ENCOUNTER — Telehealth: Payer: Self-pay | Admitting: Endocrinology

## 2012-11-23 NOTE — Telephone Encounter (Signed)
Ov within the next 2 days

## 2012-11-23 NOTE — Telephone Encounter (Signed)
Please call, regarding prescriptions. Increased sugar levels caused by meds?

## 2012-11-23 NOTE — Telephone Encounter (Signed)
Pt scheduled for 10:30 on 9/19

## 2012-11-23 NOTE — Telephone Encounter (Signed)
Pt states dentist gave her amoxcicillin 500mg , metronidazole 250mg  and hyrdoc/aceta 5/325mg  for a tooth abscess, pt states she has been feeling lowsy, could these meds cause her cbg to be elevated, pt states she has not checked her cbg?

## 2012-11-26 ENCOUNTER — Ambulatory Visit (INDEPENDENT_AMBULATORY_CARE_PROVIDER_SITE_OTHER): Payer: 59 | Admitting: Endocrinology

## 2012-11-26 ENCOUNTER — Encounter: Payer: Self-pay | Admitting: Endocrinology

## 2012-11-26 ENCOUNTER — Other Ambulatory Visit (INDEPENDENT_AMBULATORY_CARE_PROVIDER_SITE_OTHER): Payer: 59 | Admitting: *Deleted

## 2012-11-26 VITALS — BP 128/70 | HR 90 | Wt 175.0 lb

## 2012-11-26 DIAGNOSIS — C73 Malignant neoplasm of thyroid gland: Secondary | ICD-10-CM

## 2012-11-26 DIAGNOSIS — Z23 Encounter for immunization: Secondary | ICD-10-CM

## 2012-11-26 DIAGNOSIS — E209 Hypoparathyroidism, unspecified: Secondary | ICD-10-CM

## 2012-11-26 DIAGNOSIS — E119 Type 2 diabetes mellitus without complications: Secondary | ICD-10-CM

## 2012-11-26 LAB — HEMOGLOBIN A1C: Hgb A1c MFr Bld: 12.6 % — ABNORMAL HIGH (ref 4.6–6.5)

## 2012-11-26 MED ORDER — GLUCOSE BLOOD VI STRP: 1.0000 | ORAL_STRIP | Freq: Two times a day (BID) | Status: DC

## 2012-11-26 NOTE — Progress Notes (Signed)
Subjective:    Patient ID: Sandy Roy, female    DOB: 09/16/1950, 62 y.o.   MRN: [id number]  HPI Pt returns for f/u of type 2 DM (dx'ed 1989; she has mild if any neuropathy of the lower extremities; she has associated CAD and renal insufficiency).  she does not check cbg's.  pt states she feels well in general. Past Medical History  Diagnosis Date  . CONGESTIVE HEART FAILURE 12/14/2006  . CORONARY ARTERY DISEASE 12/14/2006  . DIABETES MELLITUS, TYPE II 12/14/2006  . HYPERLIPIDEMIA 12/14/2006  . HYPERTENSION 12/14/2006  . Hypoparathyroidism 12/21/2006  . Hypopotassemia 01/13/2007  . HYPOTHYROIDISM, POSTSURGICAL 12/15/2006  . NEOPLASM, MALIGNANT, THYROID GLAND 12/15/2006    Stage 1 papillary adenocarcinoma, one very small focus each lobe (78mm, 16mm)  6/09: CT neck: no evidence of recurrence  . OBSTRUCTIVE SLEEP APNEA 02/23/2007  . Chronic kidney disease     Past Surgical History  Procedure Laterality Date  . Thyroidectomy  2008  . Cholecystectomy      History   Social History  . Marital Status: Widowed    Spouse Name: N/A    Number of Children: N/A  . Years of Education: N/A   Occupational History  . Not on file.   Social History Main Topics  . Smoking status: Former Research scientist (life sciences)  . Smokeless tobacco: Not on file  . Alcohol Use: Not on file  . Drug Use: Not on file  . Sexual Activity: Not on file   Other Topics Concern  . Not on file   Social History Narrative  . No narrative on file    Current Outpatient Prescriptions on File Prior to Visit  Medication Sig Dispense Refill  . aspirin (BAYER LOW STRENGTH) 81 MG EC tablet Take 81 mg by mouth daily.        . bromocriptine (PARLODEL) 2.5 MG tablet Take 1 tablet (2.5 mg total) by mouth at bedtime.  90 tablet  11  . cloNIDine (CATAPRES) 0.2 MG tablet Take 0.2 mg by mouth 2 (two) times daily.      . fish oil-omega-3 fatty acids 1000 MG capsule Take 1 capsule by mouth daily.        . furosemide (LASIX) 40 MG tablet Take 40 mg  by mouth daily.      Marland Kitchen levothyroxine (SYNTHROID, LEVOTHROID) 100 MCG tablet Take 1 tablet (100 mcg total) by mouth daily.  30 tablet  11  . lisinopril (PRINIVIL,ZESTRIL) 20 MG tablet Take 20 mg by mouth 2 (two) times daily.       . sitaGLIPtin (JANUVIA) 100 MG tablet Take 1 tablet (100 mg total) by mouth daily.  90 tablet  11  . triazolam (HALCION) 0.25 MG tablet 2 tablets by mouth at bedtime       . atenolol (TENORMIN) 50 MG tablet Take 50 mg by mouth daily.        . citalopram (CELEXA) 40 MG tablet Take 20 mg by mouth daily.        No current facility-administered medications on file prior to visit.    No Known Allergies  No family history on file.  BP 128/70  Pulse 90  Wt 175 lb (79.379 kg)  BMI 31.48 kg/m2  SpO2 98%  Review of Systems denies hypoglycemia and weight change.      Objective:   Physical Exam VITAL SIGNS:  See vs page GENERAL: no distress   Past Medical History  Diagnosis Date  . CONGESTIVE HEART FAILURE 12/14/2006  . CORONARY ARTERY  DISEASE 12/14/2006  . DIABETES MELLITUS, TYPE II 12/14/2006  . HYPERLIPIDEMIA 12/14/2006  . HYPERTENSION 12/14/2006  . Hypoparathyroidism 12/21/2006  . Hypopotassemia 01/13/2007  . HYPOTHYROIDISM, POSTSURGICAL 12/15/2006  . NEOPLASM, MALIGNANT, THYROID GLAND 12/15/2006    Stage 1 papillary adenocarcinoma, one very small focus each lobe (48mm, 41mm)  6/09: CT neck: no evidence of recurrence  . OBSTRUCTIVE SLEEP APNEA 02/23/2007  . Chronic kidney disease    Past Surgical History  Procedure Laterality Date  . Thyroidectomy  2008  . Cholecystectomy     History   Social History  . Marital Status: Widowed    Spouse Name: N/A    Number of Children: N/A  . Years of Education: N/A   Occupational History  . Not on file.   Social History Main Topics  . Smoking status: Former Research scientist (life sciences)  . Smokeless tobacco: Not on file  . Alcohol Use: Not on file  . Drug Use: Not on file  . Sexual Activity: Not on file   Other Topics Concern  .  Not on file   Social History Narrative  . No narrative on file   Current Outpatient Prescriptions on File Prior to Visit  Medication Sig Dispense Refill  . aspirin (BAYER LOW STRENGTH) 81 MG EC tablet Take 81 mg by mouth daily.        . bromocriptine (PARLODEL) 2.5 MG tablet Take 1 tablet (2.5 mg total) by mouth at bedtime.  90 tablet  11  . cloNIDine (CATAPRES) 0.2 MG tablet Take 0.2 mg by mouth 2 (two) times daily.      . fish oil-omega-3 fatty acids 1000 MG capsule Take 1 capsule by mouth daily.        . furosemide (LASIX) 40 MG tablet Take 40 mg by mouth daily.      Marland Kitchen levothyroxine (SYNTHROID, LEVOTHROID) 100 MCG tablet Take 1 tablet (100 mcg total) by mouth daily.  30 tablet  11  . lisinopril (PRINIVIL,ZESTRIL) 20 MG tablet Take 20 mg by mouth 2 (two) times daily.       . sitaGLIPtin (JANUVIA) 100 MG tablet Take 1 tablet (100 mg total) by mouth daily.  90 tablet  11  . triazolam (HALCION) 0.25 MG tablet 2 tablets by mouth at bedtime       . atenolol (TENORMIN) 50 MG tablet Take 50 mg by mouth daily.        . citalopram (CELEXA) 40 MG tablet Take 20 mg by mouth daily.        No current facility-administered medications on file prior to visit.   No Known Allergies  No family history on file.  BP 128/70  Pulse 90  Wt 175 lb (79.379 kg)  BMI 31.48 kg/m2  SpO2 98% Lab Results  Component Value Date   HGBA1C 12.6* 11/26/2012      Assessment & Plan:  DM: poor control on oral agents:  Noncompliance with my recommendation to take insulin: this has resolved now, as she agrees to start.  i demonstrated levemir pen, and gave pt 4 units.   CAD: in this setting, she should avoid hypoglycemia. Hypoparathyroidism, on rx.

## 2012-11-26 NOTE — Patient Instructions (Addendum)
Here is a new meter. i have sent a prescription to your pharmacy, for strips. check your blood sugar twice a day.  vary the time of day when you check, between before the 3 meals, and at bedtime.  also check if you have symptoms of your blood sugar being too high or too low.  please keep a record of the readings and bring it to your next appointment here.  please call us sooner if your blood sugar goes below 70, or if you have a lot of readings over 200. blood tests are being requested for you today.  We'll contact you with results. Based on the results, i will probably say you should take the insulin.  If so, i would be happy to refer you to the diabetes education specialist, to learn more about the injection technique.   Please come back for a follow-up appointment in 2 weeks.

## 2012-11-30 ENCOUNTER — Ambulatory Visit: Payer: 59 | Admitting: Endocrinology

## 2012-12-01 ENCOUNTER — Other Ambulatory Visit: Payer: Self-pay | Admitting: Endocrinology

## 2012-12-01 ENCOUNTER — Encounter: Payer: 59 | Attending: Endocrinology | Admitting: Nutrition

## 2012-12-01 DIAGNOSIS — Z713 Dietary counseling and surveillance: Secondary | ICD-10-CM | POA: Insufficient documentation

## 2012-12-01 DIAGNOSIS — E119 Type 2 diabetes mellitus without complications: Secondary | ICD-10-CM | POA: Insufficient documentation

## 2012-12-01 NOTE — Progress Notes (Signed)
Pt. Reports that she has not used her Verio meter, because she is afraid that it will hurt.  She has started taking her Levemir, but did not take it this AM, because she wanted me to watch her do it.   We reviewed how to use the Verio meter, and she re demonstrated the procedure correctly.  Her  Blood sugar was 326, 2 hours after breakfast.  She had 2 pieces of toast with jelly. We discussed some appropriate breakfast choices for her that contain approx. 30 grams of carb and 1 ounce of protein.   I watched her do her Levemir injection, and she did this correctly,( except for counting to 5 after the injection, before pulling out the needle.)  She was on her last pen needles.  She was given a sample of 10 Nano pen needles to use until this pen is finished.   We discussed the importance of exercise and she has agreed to start a walking program of 15 min. QD, working up to 25-30 min. 5 times/wk.   I also discussed low blood sugars--symptoms and treatment.  She reported good understanding of this and had no final questions.

## 2012-12-01 NOTE — Patient Instructions (Signed)
Add protein to breakfast meal.   Consider seeing the dietitians for meal planning.

## 2012-12-03 ENCOUNTER — Ambulatory Visit: Payer: 59 | Admitting: Endocrinology

## 2012-12-16 ENCOUNTER — Encounter: Payer: Self-pay | Admitting: Endocrinology

## 2012-12-16 ENCOUNTER — Ambulatory Visit (INDEPENDENT_AMBULATORY_CARE_PROVIDER_SITE_OTHER): Payer: 59 | Admitting: Endocrinology

## 2012-12-16 VITALS — BP 126/80 | HR 80 | Wt 173.0 lb

## 2012-12-16 DIAGNOSIS — E1029 Type 1 diabetes mellitus with other diabetic kidney complication: Secondary | ICD-10-CM

## 2012-12-16 NOTE — Patient Instructions (Addendum)
check your blood sugar twice a day.  vary the time of day when you check, between before the 3 meals, and at bedtime.  also check if you have symptoms of your blood sugar being too high or too low.  please keep a record of the readings and bring it to your next appointment here.  You can write it on any piece of paper.  please call us sooner if your blood sugar goes below 70, or if you have a lot of readings over 200. Please increase the insulin to 30 units each morning. You can stop the [place] and bromocriptine.   Please come back for a follow-up appointment in 2-4 weeks.

## 2012-12-16 NOTE — Progress Notes (Signed)
Subjective:    Patient ID: Sandy Roy, female    DOB: Dec 31, 1950, 62 y.o.   MRN: [id number]  HPI Pt returns for f/u of type 2 DM (dx'ed 1989; she has mild if any neuropathy of the lower extremities; she has associated CAD and renal insufficiency; she has chosen a simple qd insulin regimen).  pt states she feels well in general.  Since starting the insulin, cbg's are mostly in the high-100's.  It is in general higher as the day goes on.   Past Medical History  Diagnosis Date  . CONGESTIVE HEART FAILURE 12/14/2006  . CORONARY ARTERY DISEASE 12/14/2006  . DIABETES MELLITUS, TYPE II 12/14/2006  . HYPERLIPIDEMIA 12/14/2006  . HYPERTENSION 12/14/2006  . Hypoparathyroidism 12/21/2006  . Hypopotassemia 01/13/2007  . HYPOTHYROIDISM, POSTSURGICAL 12/15/2006  . NEOPLASM, MALIGNANT, THYROID GLAND 12/15/2006    Stage 1 papillary adenocarcinoma, one very small focus each lobe (8mm, 2mm)  6/09: CT neck: no evidence of recurrence  . OBSTRUCTIVE SLEEP APNEA 02/23/2007  . Chronic kidney disease     Past Surgical History  Procedure Laterality Date  . Thyroidectomy  2008  . Cholecystectomy      History   Social History  . Marital Status: Widowed    Spouse Name: N/A    Number of Children: N/A  . Years of Education: N/A   Occupational History  . Not on file.   Social History Main Topics  . Smoking status: Former Research scientist (life sciences)  . Smokeless tobacco: Not on file  . Alcohol Use: Not on file  . Drug Use: Not on file  . Sexual Activity: Not on file   Other Topics Concern  . Not on file   Social History Narrative  . No narrative on file    Current Outpatient Prescriptions on File Prior to Visit  Medication Sig Dispense Refill  . aspirin (BAYER LOW STRENGTH) 81 MG EC tablet Take 81 mg by mouth daily.        . calcitRIOL (ROCALTROL) 0.25 MCG capsule Take 0.25 mcg by mouth daily.      . cloNIDine (CATAPRES) 0.2 MG tablet Take 0.2 mg by mouth 2 (two) times daily.      . fish oil-omega-3 fatty acids  1000 MG capsule Take 1 capsule by mouth daily.        . furosemide (LASIX) 40 MG tablet Take 40 mg by mouth daily.      Marland Kitchen glucose blood (ONETOUCH VERIO) test strip 1 each by Other route 2 (two) times daily. And lancets 2/day 250.01  60 each  12  . Insulin Detemir (LEVEMIR FLEXTOUCH [place]) Inject 30 Units into the skin every morning. And pen needles 1/day      . levothyroxine (SYNTHROID, LEVOTHROID) 100 MCG tablet Take 1 tablet (100 mcg total) by mouth daily.  30 tablet  11  . lisinopril (PRINIVIL,ZESTRIL) 20 MG tablet Take 20 mg by mouth 2 (two) times daily.       . triazolam (HALCION) 0.25 MG tablet 2 tablets by mouth at bedtime       . atenolol (TENORMIN) 50 MG tablet Take 50 mg by mouth daily.        . citalopram (CELEXA) 40 MG tablet Take 20 mg by mouth daily.        No current facility-administered medications on file prior to visit.   No Known Allergies  No family history on file.  BP 126/80  Pulse 80  Wt 173 lb (78.472 kg)  BMI 31.12 kg/m2  SpO2 95%  Review of Systems denies hypoglycemia and weight change.    Objective:   Physical Exam VITAL SIGNS:  See vs page GENERAL: no distress     Assessment & Plan:  DM: This insulin regimen was chosen from multiple options, for its simplicity.  The benefits of glycemic control must be weighed against the risks of hypoglycemia.  She needs increased rx CAD: in this context, she should avoid hypoglycemia.

## 2013-01-06 ENCOUNTER — Encounter: Payer: Self-pay | Admitting: Endocrinology

## 2013-01-06 ENCOUNTER — Ambulatory Visit (INDEPENDENT_AMBULATORY_CARE_PROVIDER_SITE_OTHER): Payer: 59 | Admitting: Endocrinology

## 2013-01-06 VITALS — BP 142/88 | HR 71 | Temp 98.1°F | Resp 12 | Wt 173.7 lb

## 2013-01-06 DIAGNOSIS — E1029 Type 1 diabetes mellitus with other diabetic kidney complication: Secondary | ICD-10-CM

## 2013-01-06 MED ORDER — INSULIN DETEMIR 100 UNIT/ML FLEXPEN: 30.0000 [IU] | PEN_INJECTOR | SUBCUTANEOUS | Status: DC

## 2013-01-06 NOTE — Progress Notes (Signed)
Subjective:    Patient ID: Sandy Roy, female    DOB: 08-24-1950, 62 y.o.   MRN: [id number]  HPI Pt returns for f/u of type 2 DM (dx'ed 1989, when she presented with anxiety; she has mild if any neuropathy of the lower extremities; she has associated CAD and renal insufficiency; she has chosen a simple qd insulin regimen).  she brings a record of her cbg's which i have reviewed today.  It varies from 92-200.  There is no trend throughout the day. Past Medical History  Diagnosis Date  . CONGESTIVE HEART FAILURE 12/14/2006  . CORONARY ARTERY DISEASE 12/14/2006  . DIABETES MELLITUS, TYPE II 12/14/2006  . HYPERLIPIDEMIA 12/14/2006  . HYPERTENSION 12/14/2006  . Hypoparathyroidism 12/21/2006  . Hypopotassemia 01/13/2007  . HYPOTHYROIDISM, POSTSURGICAL 12/15/2006  . NEOPLASM, MALIGNANT, THYROID GLAND 12/15/2006    Stage 1 papillary adenocarcinoma, one very small focus each lobe (50mm, 28mm)  6/09: CT neck: no evidence of recurrence  . OBSTRUCTIVE SLEEP APNEA 02/23/2007  . Chronic kidney disease     Past Surgical History  Procedure Laterality Date  . Thyroidectomy  2008  . Cholecystectomy      History   Social History  . Marital Status: Widowed    Spouse Name: N/A    Number of Children: N/A  . Years of Education: N/A   Occupational History  . Not on file.   Social History Main Topics  . Smoking status: Former Research scientist (life sciences)  . Smokeless tobacco: Not on file  . Alcohol Use: Not on file  . Drug Use: Not on file  . Sexual Activity: Not on file   Other Topics Concern  . Not on file   Social History Narrative  . No narrative on file    Current Outpatient Prescriptions on File Prior to Visit  Medication Sig Dispense Refill  . aspirin (BAYER LOW STRENGTH) 81 MG EC tablet Take 81 mg by mouth daily.        . calcitRIOL (ROCALTROL) 0.25 MCG capsule Take 0.25 mcg by mouth daily.      . cloNIDine (CATAPRES) 0.2 MG tablet Take 0.2 mg by mouth 3 (three) times daily.       . fish oil-omega-3  fatty acids 1000 MG capsule Take 1 capsule by mouth daily.        . furosemide (LASIX) 40 MG tablet Take 40 mg by mouth daily.      Marland Kitchen glucose blood (ONETOUCH VERIO) test strip 1 each by Other route 2 (two) times daily. And lancets 2/day 250.01  60 each  12  . levothyroxine (SYNTHROID, LEVOTHROID) 100 MCG tablet Take 1 tablet (100 mcg total) by mouth daily.  30 tablet  11  . lisinopril (PRINIVIL,ZESTRIL) 20 MG tablet Take 20 mg by mouth 2 (two) times daily.       . triazolam (HALCION) 0.25 MG tablet 2 tablets by mouth at bedtime        No current facility-administered medications on file prior to visit.   No Known Allergies  History reviewed. No pertinent family history.  BP 142/88  Pulse 71  Temp(Src) 98.1 F (36.7 C) (Oral)  Resp 12  Wt 173 lb 11.2 oz (78.79 kg)  BMI 31.24 kg/m2  SpO2 97%  Review of Systems denies hypoglycemia and weight change    Objective:   Physical Exam VITAL SIGNS:  See vs page GENERAL: no distress. PSYCH: Alert and oriented x 3.  Does not appear anxious nor depressed.     Assessment &  Plan:  DM: This insulin regimen was chosen from multiple options, for its simplicity.  The benefits of glycemic control must be weighed against the risks of hypoglycemia.  this is the best control this pt should aim for, given this regimen, which does match insulin to her changing needs throughout the day.  Control is improved. CAD: in this context, she should avoid hypoglycemia. Renal failure: in this setting, pt is at risk for hypoglycemia.

## 2013-01-06 NOTE — Patient Instructions (Addendum)
check your blood sugar twice a day.  vary the time of day when you check, between before the 3 meals, and at bedtime.  also check if you have symptoms of your blood sugar being too high or too low.  please keep a record of the readings and bring it to your next appointment here.  You can write it on any piece of paper.  please call us sooner if your blood sugar goes below 70, or if you have a lot of readings over 200. Please continue the same insulin.   Please come back for a follow-up appointment in 2 months.

## 2013-04-07 ENCOUNTER — Ambulatory Visit (INDEPENDENT_AMBULATORY_CARE_PROVIDER_SITE_OTHER): Payer: 59 | Admitting: Endocrinology

## 2013-04-07 ENCOUNTER — Encounter: Payer: Self-pay | Admitting: Endocrinology

## 2013-04-07 VITALS — BP 116/86 | HR 57 | Temp 97.7°F | Ht 62.5 in | Wt 185.0 lb

## 2013-04-07 DIAGNOSIS — E1029 Type 1 diabetes mellitus with other diabetic kidney complication: Secondary | ICD-10-CM

## 2013-04-07 LAB — HEMOGLOBIN A1C: HEMOGLOBIN A1C: 8.6 % — AB (ref 4.6–6.5)

## 2013-04-07 LAB — MICROALBUMIN / CREATININE URINE RATIO
Creatinine,U: 57.7 mg/dL
MICROALB UR: 83.4 mg/dL — AB (ref 0.0–1.9)
Microalb Creat Ratio: 144.6 mg/g — ABNORMAL HIGH (ref 0.0–30.0)

## 2013-04-07 MED ORDER — INSULIN PEN NEEDLE 32G X 8 MM MISC
1.0000 | Freq: Every day | Status: DC
Start: 2013-04-07 — End: 2013-07-06

## 2013-04-07 NOTE — Progress Notes (Signed)
Subjective:    Patient ID: Sandy Roy, female    DOB: [birth year], 63 y.o.   MRN: [id number]  HPI Pt returns for f/u of type 2 DM (dx'ed 1989, when she presented with anxiety; she has mild if any neuropathy of the lower extremities; she has associated CAD and renal insufficiency; she has chosen a simple qd insulin regimen).  no cbg record, but states cbg's vary from 89-259.  The 89 was after a misses a meal.   Past Medical History  Diagnosis Date  . CONGESTIVE HEART FAILURE 12/14/2006  . CORONARY ARTERY DISEASE 12/14/2006  . DIABETES MELLITUS, TYPE II 12/14/2006  . HYPERLIPIDEMIA 12/14/2006  . HYPERTENSION 12/14/2006  . Hypoparathyroidism 12/21/2006  . Hypopotassemia 01/13/2007  . HYPOTHYROIDISM, POSTSURGICAL 12/15/2006  . NEOPLASM, MALIGNANT, THYROID GLAND 12/15/2006    Stage 1 papillary adenocarcinoma, one very small focus each lobe (28mm, 2mm)  6/09: CT neck: no evidence of recurrence  . OBSTRUCTIVE SLEEP APNEA 02/23/2007  . Chronic kidney disease     Past Surgical History  Procedure Laterality Date  . Thyroidectomy  2008  . Cholecystectomy      History   Social History  . Marital Status: Widowed    Spouse Name: N/A    Number of Children: N/A  . Years of Education: N/A   Occupational History  . Not on file.   Social History Main Topics  . Smoking status: Former Research scientist (life sciences)  . Smokeless tobacco: Not on file  . Alcohol Use: Not on file  . Drug Use: Not on file  . Sexual Activity: Not on file   Other Topics Concern  . Not on file   Social History Narrative  . No narrative on file    Current Outpatient Prescriptions on File Prior to Visit  Medication Sig Dispense Refill  . aspirin (BAYER LOW STRENGTH) 81 MG EC tablet Take 81 mg by mouth daily.        . calcitRIOL (ROCALTROL) 0.25 MCG capsule Take 0.25 mcg by mouth daily.      . cloNIDine (CATAPRES) 0.2 MG tablet Take 0.2 mg by mouth 3 (three) times daily.       . fish oil-omega-3 fatty acids 1000 MG capsule Take 1  capsule by mouth daily.        . furosemide (LASIX) 40 MG tablet Take 40 mg by mouth daily.      Marland Kitchen glucose blood (ONETOUCH VERIO) test strip 1 each by Other route 2 (two) times daily. And lancets 2/day 250.01  60 each  12  . levothyroxine (SYNTHROID, LEVOTHROID) 100 MCG tablet Take 1 tablet (100 mcg total) by mouth daily.  30 tablet  11  . lisinopril (PRINIVIL,ZESTRIL) 20 MG tablet Take 20 mg by mouth 2 (two) times daily.       . triazolam (HALCION) 0.25 MG tablet 2 tablets by mouth at bedtime        No current facility-administered medications on file prior to visit.    No Known Allergies  No family history on file.  BP 116/86  Pulse 57  Temp(Src) 97.7 F (36.5 C) (Oral)  Ht 5' 2.5" (1.588 m)  Wt 185 lb (83.915 kg)  BMI 33.28 kg/m2  SpO2 95%  Review of Systems denies hypoglycemia and weight change.    Objective:   Physical Exam VITAL SIGNS:  See vs page GENERAL: no distress Lab Results  Component Value Date   HGBA1C 8.6* 04/07/2013      Assessment & Plan:  DM: This  insulin regimen was chosen from multiple options, for its simplicity.  The benefits of glycemic control must be weighed against the risks of hypoglycemia.  She needs increased rx. CAD: in this context, she should avoid hypoglycemia. Renal failure: in this setting, pt is at risk for hypoglycemia.

## 2013-04-07 NOTE — Patient Instructions (Addendum)
check your blood sugar twice a day.  vary the time of day when you check, between before the 3 meals, and at bedtime.  also check if you have symptoms of your blood sugar being too high or too low.  please keep a record of the readings and bring it to your next appointment here.  You can write it on any piece of paper.  please call us sooner if your blood sugar goes below 70, or if you have a lot of readings over 200. blood tests are being requested for you today.  We'll contact you with results.  Please come back for a follow-up appointment in 3 months.   On this type of insulin schedule, you should eat meals on a regular schedule.  If a meal is missed or significantly delayed, your blood sugar could go low.     Diabetes Meal Planning Guide The diabetes meal planning guide is a tool to help you plan your meals and snacks. It is important for people with diabetes to manage their blood glucose (sugar) levels. Choosing the right foods and the right amounts throughout your day will help control your blood glucose. Eating right can even help you improve your blood pressure and reach or maintain a healthy weight. Grains and Starches  1 slice bread.   English muffin or hotdog/hamburger bun.   cup cold cereal (unsweetened).   cup cooked pasta or rice.   cup starchy vegetables (corn, potatoes, peas, beans, winter squash).  1 tortilla (6 inches).   bagel.  1 waffle or pancake (size of a CD).   cup cooked cereal.  4 to 6 small crackers. *Whole grain is recommended. Fruit  1 cup fresh unsweetened berries, melon, papaya, pineapple.  1 small fresh fruit.   banana or mango.   cup fruit juice (4 oz unsweetened).   cup canned fruit in natural juice or water.  2 tbs dried fruit.  12 to 15 grapes or cherries. Milk and Yogurt  1 cup fat-free or 1% milk.  1 cup soy milk.  6 oz light yogurt with sugar-free sweetener.  6 oz low-fat soy yogurt.  6 oz plain  yogurt. Vegetables  1 cup raw or  cup cooked is counted as 0 carbohydrates or a "free" food.  If you eat 3 or more servings at 1 meal, count them as 1 carbohydrate serving. Other Carbohydrates   oz chips or pretzels.   cup ice cream or frozen yogurt.   cup sherbet or sorbet.  2 inch square cake, no frosting.  1 tbs honey, sugar, jam, jelly, or syrup.  2 small cookies.  3 squares of graham crackers.  3 cups popcorn.  6 crackers.  1 cup broth-based soup.  Count 1 cup casserole or other mixed foods as 2 carbohydrate servings.  Foods with less than 20 calories in a serving may be counted as 0 carbohydrates or a "free" food. You may want to purchase a book or computer software that lists the carbohydrate gram counts of different foods. In addition, the nutrition facts panel on the labels of the foods you eat are a good source of this information. The label will tell you how big the serving size is and the total number of carbohydrate grams you will be eating per serving. Divide this number by 15 to obtain the number of carbohydrate servings in a portion. Remember, 1 carbohydrate serving equals 15 grams of carbohydrate. SERVING SIZES Measuring foods and serving sizes helps you make sure you  are getting the right amount of food. The list below tells how big or small some common serving sizes are.  1 oz.........4 stacked dice.  3 oz........Marland KitchenDeck of cards.  1 tsp.......Marland KitchenTip of little finger.  1 tbs......Marland KitchenMarland KitchenThumb.  2 tbs.......Marland KitchenGolf ball.   cup......Marland KitchenHalf of a fist.  1 cup.......Marland KitchenA fist. SAMPLE DIABETES MEAL PLAN Below is a sample meal plan that includes foods from the grain and starches, dairy, vegetable, fruit, and meat groups. A dietician can individualize a meal plan to fit your calorie needs and tell you the number of servings needed from each food group. However, controlling the total amount of carbohydrates in your meal or snack is more important than making sure you  include all of the food groups at every meal. You may interchange carbohydrate containing foods (dairy, starches, and fruits). The meal plan below is an example of a 2000 calorie diet using carbohydrate counting. This meal plan has 17 carbohydrate servings. Breakfast  1 cup oatmeal (2 carb servings).   cup light yogurt (1 carb serving).  1 cup blueberries (1 carb serving).   cup almonds. Snack  1 large apple (2 carb servings).  1 low-fat string cheese stick. Lunch  Chicken breast salad.  1 cup spinach.   cup chopped tomatoes.  2 oz chicken breast, sliced.  2 tbs low-fat [place] dressing.  12 whole-wheat crackers (2 carb servings).  12 to 15 grapes (1 carb serving).  1 cup low-fat milk (1 carb serving). Snack  1 cup carrots.   cup hummus (1 carb serving). Dinner  3 oz broiled salmon.  1 cup brown rice (3 carb servings). Snack  1  cups steamed broccoli (1 carb serving) drizzled with 1 tsp olive oil and lemon juice.  1 cup light pudding (2 carb servings). DIABETES MEAL PLANNING WORKSHEET Your dietician can use this worksheet to help you decide how many servings of foods and what types of foods are right for you.  BREAKFAST Food Group and Servings / Carb Servings Grain/Starches __________________________________ Dairy __________________________________________ Vegetable ______________________________________ Fruit ___________________________________________ Meat __________________________________________ Fat ____________________________________________ LUNCH Food Group and Servings / Carb Servings Grain/Starches ___________________________________ Dairy ___________________________________________ Fruit ____________________________________________ Meat ___________________________________________ Fat _____________________________________________ Sandy Roy Food Group and Servings / Carb Servings Grain/Starches ___________________________________ Dairy  ___________________________________________ Fruit ____________________________________________ Meat ___________________________________________ Fat _____________________________________________ SNACKS Food Group and Servings / Carb Servings Grain/Starches ___________________________________ Dairy ___________________________________________ Vegetable _______________________________________ Fruit ____________________________________________ Meat ___________________________________________ Fat _____________________________________________ DAILY TOTALS Starches _________________________ Vegetable ________________________ Fruit ____________________________ Dairy ____________________________ Meat ____________________________ Fat ______________________________ Document Released: 11/21/2004 Document Revised: 05/19/2011 Document Reviewed: 10/02/2008 [employer] Patient Information 2014 [employer]

## 2013-04-08 MED ORDER — INSULIN DETEMIR 100 UNIT/ML FLEXPEN: 60.0000 [IU] | PEN_INJECTOR | SUBCUTANEOUS | Status: DC

## 2013-04-08 MED ORDER — INSULIN DETEMIR 100 UNIT/ML FLEXPEN: 40.0000 [IU] | PEN_INJECTOR | SUBCUTANEOUS | Status: DC

## 2013-05-02 ENCOUNTER — Other Ambulatory Visit: Payer: Self-pay | Admitting: Endocrinology

## 2013-06-02 ENCOUNTER — Other Ambulatory Visit: Payer: Self-pay | Admitting: Endocrinology

## 2013-07-06 ENCOUNTER — Telehealth: Payer: Self-pay | Admitting: Endocrinology

## 2013-07-06 ENCOUNTER — Encounter: Payer: Self-pay | Admitting: Endocrinology

## 2013-07-06 ENCOUNTER — Ambulatory Visit (INDEPENDENT_AMBULATORY_CARE_PROVIDER_SITE_OTHER): Payer: 59 | Admitting: Endocrinology

## 2013-07-06 VITALS — BP 124/60 | HR 91 | Temp 98.3°F | Ht 62.5 in | Wt 189.0 lb

## 2013-07-06 DIAGNOSIS — E1029 Type 1 diabetes mellitus with other diabetic kidney complication: Secondary | ICD-10-CM

## 2013-07-06 DIAGNOSIS — E890 Postprocedural hypothyroidism: Secondary | ICD-10-CM

## 2013-07-06 LAB — TSH: TSH: 3.18 u[IU]/mL (ref 0.35–5.50)

## 2013-07-06 LAB — HEMOGLOBIN A1C: Hgb A1c MFr Bld: 9.5 % — ABNORMAL HIGH (ref 4.6–6.5)

## 2013-07-06 MED ORDER — INSULIN NPH (HUMAN) (ISOPHANE) 100 UNIT/ML ~~LOC~~ SUSP: 50.0000 [IU] | SUBCUTANEOUS | Status: DC

## 2013-07-06 MED ORDER — INSULIN PEN NEEDLE 32G X 4 MM MISC: Status: DC

## 2013-07-06 NOTE — Progress Notes (Signed)
Subjective:    Patient ID: Sandy Roy, female    DOB: October 27, 1950, 63 y.o.   MRN: [id number]  HPI Pt returns for f/u of type 2 DM (dx'ed 1989, when she presented with anxiety; she has mild if any neuropathy of the lower extremities; she has associated CAD and renal insufficiency; she has chosen a simple qd insulin regimen).  no cbg record, but states cbg's vary from 200-350.  There is no trend throughout the day.  She says she can no longer afford the levemir.   Past Medical History  Diagnosis Date  . CONGESTIVE HEART FAILURE 12/14/2006  . CORONARY ARTERY DISEASE 12/14/2006  . DIABETES MELLITUS, TYPE II 12/14/2006  . HYPERLIPIDEMIA 12/14/2006  . HYPERTENSION 12/14/2006  . Hypoparathyroidism 12/21/2006  . Hypopotassemia 01/13/2007  . HYPOTHYROIDISM, POSTSURGICAL 12/15/2006  . NEOPLASM, MALIGNANT, THYROID GLAND 12/15/2006    Stage 1 papillary adenocarcinoma, one very small focus each lobe (69mm, 73mm)  6/09: CT neck: no evidence of recurrence  . OBSTRUCTIVE SLEEP APNEA 02/23/2007  . Chronic kidney disease     Past Surgical History  Procedure Laterality Date  . Thyroidectomy  2008  . Cholecystectomy      History   Social History  . Marital Status: Widowed    Spouse Name: N/A    Number of Children: N/A  . Years of Education: N/A   Occupational History  . Not on file.   Social History Main Topics  . Smoking status: Former Research scientist (life sciences)  . Smokeless tobacco: Not on file  . Alcohol Use: Not on file  . Drug Use: Not on file  . Sexual Activity: Not on file   Other Topics Concern  . Not on file   Social History Narrative  . No narrative on file    Current Outpatient Prescriptions on File Prior to Visit  Medication Sig Dispense Refill  . aspirin (BAYER LOW STRENGTH) 81 MG EC tablet Take 81 mg by mouth daily.        . cloNIDine (CATAPRES) 0.2 MG tablet Take 0.2 mg by mouth 3 (three) times daily.       . fish oil-omega-3 fatty acids 1000 MG capsule Take 1 capsule by mouth daily.         . furosemide (LASIX) 40 MG tablet Take 40 mg by mouth daily.      Marland Kitchen glucose blood (ONETOUCH VERIO) test strip 1 each by Other route 2 (two) times daily. And lancets 2/day 250.01  60 each  12  . levothyroxine (SYNTHROID, LEVOTHROID) 100 MCG tablet TAKE ONE TABLET BY MOUTH ONCE DAILY  30 tablet  1  . triazolam (HALCION) 0.25 MG tablet 2 tablets by mouth at bedtime       . calcitRIOL (ROCALTROL) 0.25 MCG capsule Take 0.25 mcg by mouth daily.      Marland Kitchen lisinopril (PRINIVIL,ZESTRIL) 20 MG tablet Take 20 mg by mouth 2 (two) times daily.        No current facility-administered medications on file prior to visit.    No Known Allergies  No family history on file.  BP 124/60  Pulse 91  Temp(Src) 98.3 F (36.8 C) (Oral)  Ht 5' 2.5" (1.588 m)  Wt 189 lb (85.73 kg)  BMI 34.00 kg/m2  SpO2 98%  Review of Systems She denies hypoglycemia and weight change.      Objective:   Physical Exam VITAL SIGNS:  See vs page GENERAL: no distress Neck: a healed scar is present.  i do not appreciate a  nodule in the thyroid or elsewhere in the neck.    Lab Results  Component Value Date   HGBA1C 9.5* 07/06/2013   Lab Results  Component Value Date   TSH 3.18 07/06/2013      Assessment & Plan:  DM: poor control Economic circumstances: these limit the rx of DM Postsurgical hypothyroidism: well-replaced

## 2013-07-06 NOTE — Patient Instructions (Addendum)
check your blood sugar twice a day.  vary the time of day when you check, between before the 3 meals, and at bedtime.  also check if you have symptoms of your blood sugar being too high or too low.  please keep a record of the readings and bring it to your next appointment here.  You can write it on any piece of paper.  please call us sooner if your blood sugar goes below 70, or if you have a lot of readings over 200. A diabetes blood test is requested for you today.  We'll contact you with results.  Please come back for a follow-up appointment in 3 months.   Please change levemir to NPH, 50 units each morning.  i have sent a prescription to your pharmacy.   On this type of insulin schedule, you should eat meals on a regular schedule.  If a meal is missed or significantly delayed, your blood sugar could go low.

## 2013-07-06 NOTE — Telephone Encounter (Signed)
Rx  sent  to  [facility]

## 2013-07-06 NOTE — Telephone Encounter (Signed)
Please call in the needles for this pt so she can administer the insulin please call in to [facility]. Patient states she does not know what needles to call in but the ones that Vaughan Basta had and helped her use where too long.

## 2013-07-08 ENCOUNTER — Telehealth: Payer: Self-pay | Admitting: Endocrinology

## 2013-07-08 NOTE — Telephone Encounter (Signed)
Patient called in regards to her sugar;complaining of being high, its not going down  Should patient continue to watch  Levemir 50 units   Please advise patient on what to do   Thank You

## 2013-07-08 NOTE — Telephone Encounter (Signed)
Please increase insulin to 70 units qam Call Monday if this does not get it to the 100's

## 2013-07-08 NOTE — Telephone Encounter (Signed)
Pt states that her average blood sugar over the past week has been 302.  Please advise, Thanks!

## 2013-07-20 ENCOUNTER — Other Ambulatory Visit: Payer: Self-pay

## 2013-07-20 MED ORDER — "SYRINGE/NEEDLE (DISP) 25G X 5/8"" 3 ML MISC": Status: DC

## 2013-07-20 MED ORDER — "INSULIN SYRINGE-NEEDLE U-100 31G X 5/16"" 0.3 ML MISC": Status: DC

## 2013-07-26 ENCOUNTER — Telehealth: Payer: Self-pay | Admitting: Endocrinology

## 2013-07-26 ENCOUNTER — Encounter: Payer: 59 | Attending: Endocrinology | Admitting: Nutrition

## 2013-07-26 DIAGNOSIS — Z713 Dietary counseling and surveillance: Secondary | ICD-10-CM | POA: Insufficient documentation

## 2013-07-26 DIAGNOSIS — Z794 Long term (current) use of insulin: Secondary | ICD-10-CM | POA: Insufficient documentation

## 2013-07-26 DIAGNOSIS — E119 Type 2 diabetes mellitus without complications: Secondary | ICD-10-CM | POA: Insufficient documentation

## 2013-07-26 MED ORDER — INSULIN NPH ISOPHANE & REGULAR (70-30) 100 UNIT/ML ~~LOC~~ SUSP: 50.0000 [IU] | Freq: Every day | SUBCUTANEOUS | Status: DC

## 2013-07-26 NOTE — Telephone Encounter (Signed)
Please change to 70/30.  i have sent a prescription to your pharmacy

## 2013-07-27 ENCOUNTER — Encounter: Payer: 59 | Admitting: Dietician

## 2013-07-27 ENCOUNTER — Encounter: Payer: Self-pay | Admitting: Dietician

## 2013-07-27 VITALS — Ht 62.75 in | Wt 195.8 lb

## 2013-07-27 DIAGNOSIS — E119 Type 2 diabetes mellitus without complications: Secondary | ICD-10-CM

## 2013-07-27 NOTE — Patient Instructions (Signed)
Aim to eat 3 meals a day. Have some protein with carbohydrates (eggs + toast or meat with breads, potatoes at other meals). Limit meat to the size of a deck of cards.  Try to increase vegetables with a goal of filling half your plate.  Have either 1 glass of milk per day or 1/2 glass on days you have cheese. Consider preparing more meals at home in order to help limit salt. Starting walking 10 minutes per day and increase when you can with a goal of 30 minutes per day.  Choose frozen vegetables over canned vegetables since canned vegetables have more salt.

## 2013-07-27 NOTE — Progress Notes (Signed)
This patient is here today to review blood sugars after switching her to NPH insulin.  She was under the impression that she was to see the dietitian for a diet consult that was referred from her "kidney doctor".   She did not bring her meter today, but says that her FBSs are ususally slightly less than 100, except for this morning when it was 182.  She had a late night snack of 3 tacos at 10PM.   She reports that blood sugars during the rest of the day are "very high"--281- low 300s before supper and after lunch.   Diet:  Bfast (7-8AM) is 2 eggs, 2 toast with butter, 2 pieces of bacon, or oatmeal and small glass of orange juice.            Lunch: (12-1) is sandwich with a piece of fruit and water or crystal light lemonade to drink.  Sometimes she will eat her big meal at this time, and it will be spaghetti with meat sauce.            Supper (5PM) hot dog with french fries, or tacos with chips.   She denies eating between meals and usually nothing before bed, except last night when she could not sleep and felt very sad about her kidney problem.  She began to cry and wanted to go upstairs to see the dietitian for further diet consult.   Plan:  Dr. Loanne Drilling notified of blood sugar readings and she was switched to Novolin 70-30 insulin.  I explained how this will work differently from her NPH, and we reviewed the need to wait 30-45 min.  After taking the insulin before eating breakfast.  She reported good understanding of this.  I phoned the [facility], and they confirmed a referral from her Kidney doctor.  The next available appt. Is in July, and the patient became very terri-eyed.  I told her that I would put her on a waitiing list and speak to the dietitian upstairs to see if she can get in sooner.  She stopped crying and thanked me.  She was told to limit her meat portions to no more than 3-4 ounces at her biggest meal, until she can get in to see the dietitian for a more precise dietary consult.   She agreed to do this.   We reviewed again the name of her new insulin and the need to wait for at least 30 min. After taking it, before eating breakfast.  She had no final questions.

## 2013-07-27 NOTE — Telephone Encounter (Signed)
done

## 2013-07-27 NOTE — Progress Notes (Signed)
  Medical Nutrition Therapy:  Appt start time: 1115 end time:  1230.  Assessment:  Primary concerns today: Sandy Roy is here today for help with following a diet to help with her kidney disease and diabetes. Starting taking insulin a few months ago and was previously not interested/nervous to take an injection. Blood sugar has been elevated for a long time (over 5-8 years). Was around 340-350 mg/dl. Just got a new dose of insulin and fasting this morning was 82 mg/dl and after having a banana it was 190 mg/dl. Testing blood sugar 2 x day. Last Hgb A1c was 9.5% on 07/06/2013.  Has had CKD for about a year, but she is not sure. Has not had any instruction on a diet to follow for diabetes or CKD.   Lives by herself and eats out "all the time". Skips meals sometimes because her sugar goes up when she eats.   Was walking and stopped because of her "sneakers". States she gained about 20 lbs in the past year. Was previously losing weight before that.  Had a low blood sugar during the appointment (49 mg/dl). Went over the process for dealing with hypoglycemia.    Preferred Learning Style:   No preference indicated   Learning Readiness:   Ready  MEDICATIONS: insulin   DIETARY INTAKE:  Usual eating pattern includes 1-2 meals and up to 1 snacks per day.  Avoided foods include: baked chicken, salmon, liver      24-hr recall:  B ( AM): coffee and sometimes will have a banana or oatmeal with butter and sugar Snk ( AM): none L ( PM): pepper steak and rice take out Snk ( PM): none D ( PM): will skip a lot, peanut butter sandwich with milk or tomato soup and peanut butter crackers or a hot dog with fries or tacos or raviolis Snk ( PM): peanuts Beverages: water or Crystal Light  Usual physical activity: was walking and stopped  Estimated energy needs: 1600 calories 180 g carbohydrates 120 g protein 44 g fat  Progress Towards Goal(s):  In progress.   Nutritional Diagnosis:  [place]-2.1 Inpaired  nutrition utilization As related to glucose metabolism.  As evidenced by Hb A1c of 9.5%.    Intervention:  Nutrition counseling provided. Plan: Aim to eat 3 meals a day. Have some protein with carbohydrates (eggs + toast or meat with breads, potatoes at other meals). Limit meat to the size of a deck of cards.  Try to increase vegetables with a goal of filling half your plate.  Have either 1 glass of milk per day or 1/2 glass on days you have cheese. Consider preparing more meals at home in order to help limit salt. Starting walking 10 minutes per day and increase when you can with a goal of 30 minutes per day.  Choose frozen vegetables over canned vegetables since canned vegetables have more salt.   Teaching Method Utilized:  Visual Auditory Hands on  Handouts given during visit include:  Living Well With Diabetes  CKD and Diabetes from eatright.org  Barriers to learning/adherence to lifestyle change: eats a lot of meals from restaurants  Demonstrated degree of understanding via:  Teach Back   Monitoring/Evaluation:  Dietary intake, exercise, and body weight in 1 month(s).

## 2013-08-02 ENCOUNTER — Other Ambulatory Visit: Payer: Self-pay | Admitting: Endocrinology

## 2013-09-01 ENCOUNTER — Other Ambulatory Visit: Payer: Self-pay | Admitting: Endocrinology

## 2013-09-08 ENCOUNTER — Ambulatory Visit: Payer: 59 | Admitting: Dietician

## 2013-09-15 ENCOUNTER — Ambulatory Visit: Payer: 59 | Admitting: *Deleted

## 2013-09-26 ENCOUNTER — Other Ambulatory Visit: Payer: Self-pay | Admitting: Endocrinology

## 2013-09-30 ENCOUNTER — Encounter: Payer: Self-pay | Admitting: Endocrinology

## 2013-09-30 ENCOUNTER — Ambulatory Visit (INDEPENDENT_AMBULATORY_CARE_PROVIDER_SITE_OTHER): Payer: Medicare Other | Admitting: Endocrinology

## 2013-09-30 VITALS — BP 124/82 | HR 60 | Temp 97.9°F | Ht 62.75 in | Wt 196.0 lb

## 2013-09-30 DIAGNOSIS — E1029 Type 1 diabetes mellitus with other diabetic kidney complication: Secondary | ICD-10-CM | POA: Diagnosis not present

## 2013-09-30 NOTE — Patient Instructions (Addendum)
check your blood sugar twice a day.  vary the time of day when you check, between before the 3 meals, and at bedtime.  also check if you have symptoms of your blood sugar being too high or too low.  please keep a record of the readings and bring it to your next appointment here.  You can write it on any piece of paper.  please call us sooner if your blood sugar goes below 70, or if you have a lot of readings over 200. Please come back for a follow-up appointment in 2 months.   On this type of insulin schedule, you should eat meals on a regular schedule.  If a meal is missed or significantly delayed, your blood sugar could go low.

## 2013-09-30 NOTE — Progress Notes (Signed)
Subjective:    Patient ID: Sandy Roy, female    DOB: 12/29/1950, 63 y.o.   MRN: [id number]  HPI Pt returns for f/u of type 2 DM (dx'ed 1989, when she presented with anxiety; she has mild if any neuropathy of the lower extremities; she has associated CAD and renal insufficiency; she has chosen a simple qd insulin regimen; she takes human insulin due to cost).  no cbg record, but states cbg's are well-controlled.  pt states she feels well in general. Past Medical History  Diagnosis Date  . CONGESTIVE HEART FAILURE 12/14/2006  . CORONARY ARTERY DISEASE 12/14/2006  . DIABETES MELLITUS, TYPE II 12/14/2006  . HYPERLIPIDEMIA 12/14/2006  . HYPERTENSION 12/14/2006  . Hypoparathyroidism 12/21/2006  . Hypopotassemia 01/13/2007  . HYPOTHYROIDISM, POSTSURGICAL 12/15/2006  . NEOPLASM, MALIGNANT, THYROID GLAND 12/15/2006    Stage 1 papillary adenocarcinoma, one very small focus each lobe (61mm, 32mm)  6/09: CT neck: no evidence of recurrence  . OBSTRUCTIVE SLEEP APNEA 02/23/2007  . Chronic kidney disease     Past Surgical History  Procedure Laterality Date  . Thyroidectomy  2008  . Cholecystectomy      History   Social History  . Marital Status: Widowed    Spouse Name: N/A    Number of Children: N/A  . Years of Education: N/A   Occupational History  . Not on file.   Social History Main Topics  . Smoking status: Former Research scientist (life sciences)  . Smokeless tobacco: Not on file  . Alcohol Use: Not on file  . Drug Use: Not on file  . Sexual Activity: Not on file   Other Topics Concern  . Not on file   Social History Narrative  . No narrative on file    Current Outpatient Prescriptions on File Prior to Visit  Medication Sig Dispense Refill  . amLODipine (NORVASC) 10 MG tablet Take 10 mg by mouth daily.      Marland Kitchen aspirin (BAYER LOW STRENGTH) 81 MG EC tablet Take 81 mg by mouth daily.        Marland Kitchen atorvastatin (LIPITOR) 10 MG tablet Take 10 mg by mouth daily.      . calcitRIOL (ROCALTROL) 0.25 MCG capsule  Take 0.25 mcg by mouth daily.      . cloNIDine (CATAPRES) 0.2 MG tablet Take 0.2 mg by mouth 3 (three) times daily.       Marland Kitchen doxazosin (CARDURA) 1 MG tablet Take 1 mg by mouth daily.      . fish oil-omega-3 fatty acids 1000 MG capsule Take 1 capsule by mouth daily.        . furosemide (LASIX) 40 MG tablet Take 40 mg by mouth daily.      Marland Kitchen glucose blood (ONETOUCH VERIO) test strip 1 each by Other route 2 (two) times daily. And lancets 2/day 250.01  60 each  12  . insulin NPH-regular Human (NOVOLIN 70/30) (70-30) 100 UNIT/ML injection Inject 50 Units into the skin daily with breakfast.  20 mL  11  . Insulin Pen Needle 32G X 4 MM MISC 1 x day  100 each  1  . Insulin Syringe-Needle U-100 (B-D INSULIN SYRINGE) 31G X 5/16" 0.3 ML MISC Use 1 time per day.  100 each  1  . levothyroxine (SYNTHROID, LEVOTHROID) 100 MCG tablet TAKE ONE TABLET BY MOUTH ONCE DAILY  30 tablet  0  . SYRINGE-NEEDLE, DISP, 3 ML (BD ECLIPSE SYRINGE) 25G X 5/8" 3 ML MISC Use 1 time per day.  100 each  2  . triazolam (HALCION) 0.25 MG tablet 2 tablets by mouth at bedtime       . lisinopril (PRINIVIL,ZESTRIL) 20 MG tablet Take 20 mg by mouth 2 (two) times daily.        No current facility-administered medications on file prior to visit.    No Known Allergies  Family History  Problem Relation Age of Onset  . Cancer Other   . COPD Other   . Hypertension Other   . Hyperlipidemia Other   . Stroke Other   . Heart disease Other   . Diabetes Other     BP 124/82  Pulse 60  Temp(Src) 97.9 F (36.6 C) (Oral)  Ht 5' 2.75" (1.594 m)  Wt 196 lb (88.905 kg)  BMI 34.99 kg/m2  SpO2 97%    Review of Systems She denies hypoglycemia.  She has weight gain.      Objective:   Physical Exam Pulses: dorsalis pedis intact bilat.   Feet: no deformity. normal color and temp.  no edema Skin:  no ulcer on the feet.   Neuro: sensation is intact to touch on the feet.     Lab Results  Component Value Date   HGBA1C 9.5* 07/06/2013        Assessment & Plan:  DM: uncertain control Noncompliance with cbg recording: I'll work around this as best I can. Weight gain: this complicates the rx of dm. This impairs the ability to achieve glycemic control.  Pt is advised to re-lose.   Patient is advised the following: Patient Instructions  check your blood sugar twice a day.  vary the time of day when you check, between before the 3 meals, and at bedtime.  also check if you have symptoms of your blood sugar being too high or too low.  please keep a record of the readings and bring it to your next appointment here.  You can write it on any piece of paper.  please call us sooner if your blood sugar goes below 70, or if you have a lot of readings over 200. Please come back for a follow-up appointment in 2 months.   On this type of insulin schedule, you should eat meals on a regular schedule.  If a meal is missed or significantly delayed, your blood sugar could go low.

## 2013-10-28 ENCOUNTER — Other Ambulatory Visit: Payer: Self-pay | Admitting: Endocrinology

## 2013-11-01 ENCOUNTER — Other Ambulatory Visit: Payer: Self-pay | Admitting: Endocrinology

## 2013-11-01 ENCOUNTER — Other Ambulatory Visit: Payer: Self-pay

## 2013-11-01 ENCOUNTER — Telehealth: Payer: Self-pay | Admitting: Endocrinology

## 2013-11-01 MED ORDER — ONETOUCH LANCETS MISC: Status: DC

## 2013-11-01 NOTE — Telephone Encounter (Signed)
Patient would like a refill on her Levothyroxine 100 She is completely out  She also needs lancets   Pharmacy: [facility]   Thank you

## 2013-11-01 NOTE — Telephone Encounter (Signed)
Rx for Levothyroxine and Lancets sent to pharmacy per pt's request.

## 2013-11-28 ENCOUNTER — Other Ambulatory Visit: Payer: Self-pay | Admitting: Endocrinology

## 2013-12-01 ENCOUNTER — Encounter: Payer: Self-pay | Admitting: Endocrinology

## 2013-12-01 ENCOUNTER — Ambulatory Visit (INDEPENDENT_AMBULATORY_CARE_PROVIDER_SITE_OTHER): Payer: Medicare Other | Admitting: Endocrinology

## 2013-12-01 VITALS — BP 132/78 | HR 64 | Temp 97.9°F | Ht 62.75 in | Wt 202.0 lb

## 2013-12-01 DIAGNOSIS — E1029 Type 1 diabetes mellitus with other diabetic kidney complication: Secondary | ICD-10-CM

## 2013-12-01 LAB — HEMOGLOBIN A1C: Hgb A1c MFr Bld: 7.8 % — ABNORMAL HIGH (ref 4.6–6.5)

## 2013-12-01 LAB — BASIC METABOLIC PANEL
BUN: 68 mg/dL — ABNORMAL HIGH (ref 6–23)
CO2: 29 mEq/L (ref 19–32)
Calcium: 8.5 mg/dL (ref 8.4–10.5)
Chloride: 95 mEq/L — ABNORMAL LOW (ref 96–112)
Creatinine, Ser: 5.3 mg/dL (ref 0.4–1.2)
GFR: 10.53 mL/min — AB (ref >60.00)
GLUCOSE: 95 mg/dL (ref 70–99)
POTASSIUM: 3.3 meq/L — AB (ref 3.5–5.1)
SODIUM: 137 meq/L (ref 135–145)

## 2013-12-01 NOTE — Patient Instructions (Addendum)
check your blood sugar twice a day.  vary the time of day when you check, between before the 3 meals, and at bedtime.  also check if you have symptoms of your blood sugar being too high or too low.  please keep a record of the readings and bring it to your next appointment here.  You can write it on any piece of paper.  please call us sooner if your blood sugar goes below 70, or if you have a lot of readings over 200. Please come back for a follow-up appointment in January.   On this type of insulin schedule, you should eat meals on a regular schedule.  If a meal is missed or significantly delayed, your blood sugar could go low.  blood tests are being requested for you today.  We'll contact you with results.

## 2013-12-01 NOTE — Progress Notes (Signed)
Subjective:    Patient ID: Sandy Roy, female    DOB: 11-25-1950, 63 y.o.   MRN: [id number]  HPI Pt returns for f/u of diabetes mellitus:  DM type: insulin-requiring type 2 Dx'ed: [id number] Complications: CAD and renal insufficiency Therapy: insulin since 2015 GDM: never DKA: never Severe hypoglycemia: never Pancreatitis: never Other info: she wants an inexpensive and simple insulin regimen.  Interval history: no cbg record, but states cbg's are well-controlled in general.  It is mildly low when a meal is missed or delayed.   Past Medical History  Diagnosis Date  . CONGESTIVE HEART FAILURE 12/14/2006  . CORONARY ARTERY DISEASE 12/14/2006  . DIABETES MELLITUS, TYPE II 12/14/2006  . HYPERLIPIDEMIA 12/14/2006  . HYPERTENSION 12/14/2006  . Hypoparathyroidism 12/21/2006  . Hypopotassemia 01/13/2007  . HYPOTHYROIDISM, POSTSURGICAL 12/15/2006  . NEOPLASM, MALIGNANT, THYROID GLAND 12/15/2006    Stage 1 papillary adenocarcinoma, one very small focus each lobe (62mm, 86mm)  6/09: CT neck: no evidence of recurrence  . OBSTRUCTIVE SLEEP APNEA 02/23/2007  . Chronic kidney disease     Past Surgical History  Procedure Laterality Date  . Thyroidectomy  2008  . Cholecystectomy      History   Social History  . Marital Status: Widowed    Spouse Name: N/A    Number of Children: N/A  . Years of Education: N/A   Occupational History  . Not on file.   Social History Main Topics  . Smoking status: Former Research scientist (life sciences)  . Smokeless tobacco: Not on file  . Alcohol Use: Not on file  . Drug Use: Not on file  . Sexual Activity: Not on file   Other Topics Concern  . Not on file   Social History Narrative  . No narrative on file    Current Outpatient Prescriptions on File Prior to Visit  Medication Sig Dispense Refill  . amLODipine (NORVASC) 10 MG tablet Take 10 mg by mouth daily.      Marland Kitchen aspirin (BAYER LOW STRENGTH) 81 MG EC tablet Take 81 mg by mouth daily.        Marland Kitchen atorvastatin (LIPITOR) 10  MG tablet Take 10 mg by mouth daily.      . calcitRIOL (ROCALTROL) 0.25 MCG capsule Take 0.25 mcg by mouth daily.      . cloNIDine (CATAPRES) 0.2 MG tablet Take 0.2 mg by mouth 3 (three) times daily.       . fish oil-omega-3 fatty acids 1000 MG capsule Take 1 capsule by mouth daily.        . furosemide (LASIX) 40 MG tablet Take 40 mg by mouth daily.      Marland Kitchen glucose blood (ONETOUCH VERIO) test strip 1 each by Other route 2 (two) times daily. And lancets 2/day 250.01  60 each  12  . insulin NPH-regular Human (NOVOLIN 70/30) (70-30) 100 UNIT/ML injection Inject 50 Units into the skin daily with breakfast.  20 mL  11  . Insulin Pen Needle 32G X 4 MM MISC 1 x day  100 each  1  . Insulin Syringe-Needle U-100 (B-D INSULIN SYRINGE) 31G X 5/16" 0.3 ML MISC Use 1 time per day.  100 each  1  . levothyroxine (SYNTHROID, LEVOTHROID) 100 MCG tablet TAKE ONE TABLET BY MOUTH ONCE DAILY  30 tablet  0  . levothyroxine (SYNTHROID, LEVOTHROID) 100 MCG tablet TAKE ONE TABLET BY MOUTH ONCE DAILY  30 tablet  0  . ONE TOUCH LANCETS MISC Use 2 times daily to check blood sugar.  Please dispense as Delica DX code [id number]  200 each  1  . SYRINGE-NEEDLE, DISP, 3 ML (BD ECLIPSE SYRINGE) 25G X 5/8" 3 ML MISC Use 1 time per day.  100 each  2  . triazolam (HALCION) 0.25 MG tablet 2 tablets by mouth at bedtime       . lisinopril (PRINIVIL,ZESTRIL) 20 MG tablet Take 20 mg by mouth 2 (two) times daily.        No current facility-administered medications on file prior to visit.    No Known Allergies  Family History  Problem Relation Age of Onset  . Cancer Other   . COPD Other   . Hypertension Other   . Hyperlipidemia Other   . Stroke Other   . Heart disease Other   . Diabetes Other     BP 132/78  Pulse 64  Temp(Src) 97.9 F (36.6 C) (Oral)  Ht 5' 2.75" (1.594 m)  Wt 202 lb (91.627 kg)  BMI 36.06 kg/m2  SpO2 98%   Review of Systems She has gained weight.  Denies LOC.     Objective:   Physical Exam VITAL SIGNS:   See vs page GENERAL: no distress. Pulses: dorsalis pedis intact bilat.   Feet: no deformity.  no edema.  There is bilateral onychomycosis Skin:  no ulcer on the feet.  normal color and temp. Neuro: sensation is intact to touch on the feet  Lab Results  Component Value Date   HGBA1C 7.8* 12/01/2013       Assessment & Plan:  DM: this is the best control this pt should aim for, given this regimen, which does match insulin to her changing needs throughout the day Weight gain: she is encouraged to re-lose Noncompliance with cbg recording: I'll work around this as best I can.   Patient is advised the following: Patient Instructions  check your blood sugar twice a day.  vary the time of day when you check, between before the 3 meals, and at bedtime.  also check if you have symptoms of your blood sugar being too high or too low.  please keep a record of the readings and bring it to your next appointment here.  You can write it on any piece of paper.  please call us sooner if your blood sugar goes below 70, or if you have a lot of readings over 200. Please come back for a follow-up appointment in January.   On this type of insulin schedule, you should eat meals on a regular schedule.  If a meal is missed or significantly delayed, your blood sugar could go low.  blood tests are being requested for you today.  We'll contact you with results.

## 2013-12-08 ENCOUNTER — Encounter: Payer: Self-pay | Admitting: Interventional Cardiology

## 2013-12-08 ENCOUNTER — Ambulatory Visit (INDEPENDENT_AMBULATORY_CARE_PROVIDER_SITE_OTHER): Payer: Medicare Other | Admitting: Interventional Cardiology

## 2013-12-08 VITALS — BP 110/80 | HR 52 | Ht 63.5 in | Wt 198.0 lb

## 2013-12-08 DIAGNOSIS — N184 Chronic kidney disease, stage 4 (severe): Secondary | ICD-10-CM | POA: Diagnosis not present

## 2013-12-08 DIAGNOSIS — E785 Hyperlipidemia, unspecified: Secondary | ICD-10-CM

## 2013-12-08 DIAGNOSIS — N186 End stage renal disease: Secondary | ICD-10-CM | POA: Insufficient documentation

## 2013-12-08 DIAGNOSIS — Z992 Dependence on renal dialysis: Secondary | ICD-10-CM

## 2013-12-08 DIAGNOSIS — I2510 Atherosclerotic heart disease of native coronary artery without angina pectoris: Secondary | ICD-10-CM

## 2013-12-08 DIAGNOSIS — I10 Essential (primary) hypertension: Secondary | ICD-10-CM

## 2013-12-08 MED ORDER — NITROGLYCERIN 0.4 MG SL SUBL: 0.4000 mg | SUBLINGUAL_TABLET | SUBLINGUAL | Status: DC | PRN

## 2013-12-08 MED ORDER — ATORVASTATIN CALCIUM 40 MG PO TABS: 40.0000 mg | ORAL_TABLET | Freq: Every day | ORAL | Status: DC

## 2013-12-08 NOTE — Patient Instructions (Signed)
Your physician has recommended you make the following change in your medication:   1. Increase Atorvastatin to 40 mg 1 tablet daily.   2. Start SL Nitroglycerin as needed for Chest Pain.   Your physician recommends that you return for a FASTING lipid and hepatic panel on 03/13/14.  Your physician wants you to follow-up in: 6 months with Dr. Irish Lack. You will receive a reminder letter in the mail two months in advance. If you don't receive a letter, please call our office to schedule the follow-up appointment.  Nitroglycerin sublingual tablets What is this medicine? NITROGLYCERIN (nye troe GLI ser in) is a type of vasodilator. It relaxes blood vessels, increasing the blood and oxygen supply to your heart. This medicine is used to relieve chest pain caused by angina. It is also used to prevent chest pain before activities like climbing stairs, going outdoors in cold weather, or sexual activity. This medicine may be used for other purposes; ask your health care provider or pharmacist if you have questions. COMMON BRAND NAME(S): Nitroquick, Nitrostat, Nitrotab What should I tell my health care provider before I take this medicine? They need to know if you have any of these conditions: -anemia -head injury, recent stroke, or bleeding in the brain -liver disease -previous heart attack -an unusual or allergic reaction to nitroglycerin, other medicines, foods, dyes, or preservatives -pregnant or trying to get pregnant -breast-feeding How should I use this medicine? Take this medicine by mouth as needed. At the first sign of an angina attack (chest pain or tightness) place one tablet under your tongue. You can also take this medicine 5 to 10 minutes before an event likely to produce chest pain. Follow the directions on the prescription label. Let the tablet dissolve under the tongue. Do not swallow whole. Replace the dose if you accidentally swallow it. It will help if your mouth is not dry. Saliva  around the tablet will help it to dissolve more quickly. Do not eat or drink, smoke or chew tobacco while a tablet is dissolving. If you are not better within 5 minutes after taking ONE dose of nitroglycerin, call 9-1-1 immediately to seek emergency medical care. Do not take more than 3 nitroglycerin tablets over 15 minutes. If you take this medicine often to relieve symptoms of angina, your doctor or health care professional may provide you with different instructions to manage your symptoms. If symptoms do not go away after following these instructions, it is important to call 9-1-1 immediately. Do not take more than 3 nitroglycerin tablets over 15 minutes. Talk to your pediatrician regarding the use of this medicine in children. Special care may be needed. Overdosage: If you think you have taken too much of this medicine contact a poison control center or emergency room at once. NOTE: This medicine is only for you. Do not share this medicine with others. What if I miss a dose? This does not apply. This medicine is only used as needed. What may interact with this medicine? Do not take this medicine with any of the following medications: -certain migraine medicines like ergotamine and dihydroergotamine (DHE) -medicines used to treat erectile dysfunction like sildenafil, tadalafil, and vardenafil -riociguat This medicine may also interact with the following medications: -alteplase -aspirin -heparin -medicines for high blood pressure -medicines for mental depression -other medicines used to treat angina -phenothiazines like chlorpromazine, mesoridazine, prochlorperazine, thioridazine This list may not describe all possible interactions. Give your health care provider a list of all the medicines, herbs, non-prescription drugs,  or dietary supplements you use. Also tell them if you smoke, drink alcohol, or use illegal drugs. Some items may interact with your medicine. What should I watch for while  using this medicine? Tell your doctor or health care professional if you feel your medicine is no longer working. Keep this medicine with you at all times. Sit or lie down when you take your medicine to prevent falling if you feel dizzy or faint after using it. Try to remain calm. This will help you to feel better faster. If you feel dizzy, take several deep breaths and lie down with your feet propped up, or bend forward with your head resting between your knees. You may get drowsy or dizzy. Do not drive, use machinery, or do anything that needs mental alertness until you know how this drug affects you. Do not stand or sit up quickly, especially if you are an older patient. This reduces the risk of dizzy or fainting spells. Alcohol can make you more drowsy and dizzy. Avoid alcoholic drinks. Do not treat yourself for coughs, colds, or pain while you are taking this medicine without asking your doctor or health care professional for advice. Some ingredients may increase your blood pressure. What side effects may I notice from receiving this medicine? Side effects that you should report to your doctor or health care professional as soon as possible: -blurred vision -dry mouth -skin rash -sweating -the feeling of extreme pressure in the head -unusually weak or tired Side effects that usually do not require medical attention (report to your doctor or health care professional if they continue or are bothersome): -flushing of the face or neck -headache -irregular heartbeat, palpitations -nausea, vomiting This list may not describe all possible side effects. Call your doctor for medical advice about side effects. You may report side effects to FDA at 1-800-FDA-1088. Where should I keep my medicine? Keep out of the reach of children. Store at room temperature between 20 and 25 degrees C (68 and 77 degrees F). Store in [employer]. Protect from light and moisture. Keep tightly closed. Throw away  any unused medicine after the expiration date. NOTE: This sheet is a summary. It may not cover all possible information. If you have questions about this medicine, talk to your doctor, pharmacist, or health care provider.  2015, Elsevier/Gold Standard. (2012-12-23 17:57:36)

## 2013-12-08 NOTE — Progress Notes (Signed)
Patient ID: Sandy Roy, female   DOB: 1951/03/05, 63 y.o.   MRN: [id number]     Patient ID: Sandy Roy MRN: [id number] DOB/AGE: Jan 28, 1951 63 y.o.   Referring Physician Dr. Moreen Fowler   Reason for Consultation   HPI: 63 y/o woman with a h/o Bradycardia and hypotension in 2005 when taking a combination of  Diltiazem plus Atenolol. She also had Coronary artery disease with chronic occlusion of the right coronary artery,  diffuse disease and small vessel disease involving the left anterior descending artery dating back to 2005.  She has had Hypertension and Diabetes.  She feels "slow."  She has had some swelling in her legs so stopped activity.  She gained weight.  She has started walking again and has no sx when she walks.  She walks about 40 minutes without chest pain or SHOB.    She has severe renal disease.  She says her lisinopril was stopped. Seems this was related to her kidney function.  Dr. Florene Glen added Doxazosin for HTN treatment.  BP is not checked at home much.      Current Outpatient Prescriptions  Medication Sig Dispense Refill  . amLODipine (NORVASC) 10 MG tablet Take 10 mg by mouth daily.      Marland Kitchen aspirin (BAYER LOW STRENGTH) 81 MG EC tablet Take 81 mg by mouth daily.        Marland Kitchen atorvastatin (LIPITOR) 10 MG tablet Take 20 mg by mouth daily.       . calcitRIOL (ROCALTROL) 0.25 MCG capsule Take 0.25 mcg by mouth daily.      . cholecalciferol (VITAMIN D) 1000 UNITS tablet Take 1,000 Units by mouth daily.      . cloNIDine (CATAPRES) 0.2 MG tablet Take 0.2 mg by mouth 3 (three) times daily.       Marland Kitchen doxazosin (CARDURA) 2 MG tablet Take 2 mg by mouth daily.      . fish oil-omega-3 fatty acids 1000 MG capsule Take 1 capsule by mouth daily.        . furosemide (LASIX) 40 MG tablet Take 40 mg by mouth daily.      Marland Kitchen glucose blood (ONETOUCH VERIO) test strip 1 each by Other route 2 (two) times daily. And lancets 2/day 250.01  60 each  12  . insulin NPH-regular Human (NOVOLIN  70/30) (70-30) 100 UNIT/ML injection Inject 50 Units into the skin daily with breakfast.  20 mL  11  . Insulin Pen Needle 32G X 4 MM MISC 1 x day  100 each  1  . Insulin Syringe-Needle U-100 (B-D INSULIN SYRINGE) 31G X 5/16" 0.3 ML MISC Use 1 time per day.  100 each  1  . levothyroxine (SYNTHROID, LEVOTHROID) 100 MCG tablet TAKE ONE TABLET BY MOUTH ONCE DAILY  30 tablet  0  . ONE TOUCH LANCETS MISC Use 2 times daily to check blood sugar. Please dispense as Delica DX code [id number]  200 each  1  . SYRINGE-NEEDLE, DISP, 3 ML (BD ECLIPSE SYRINGE) 25G X 5/8" 3 ML MISC Use 1 time per day.  100 each  2  . triazolam (HALCION) 0.25 MG tablet 2 tablets by mouth at bedtime       . levothyroxine (SYNTHROID, LEVOTHROID) 100 MCG tablet TAKE ONE TABLET BY MOUTH ONCE DAILY  30 tablet  0  . lisinopril (PRINIVIL,ZESTRIL) 20 MG tablet Take 20 mg by mouth 2 (two) times daily.        No current facility-administered medications for this visit.  Past Medical History  Diagnosis Date  . CONGESTIVE HEART FAILURE 12/14/2006  . CORONARY ARTERY DISEASE 12/14/2006  . DIABETES MELLITUS, TYPE II 12/14/2006  . HYPERLIPIDEMIA 12/14/2006  . HYPERTENSION 12/14/2006  . Hypoparathyroidism 12/21/2006  . Hypopotassemia 01/13/2007  . HYPOTHYROIDISM, POSTSURGICAL 12/15/2006  . NEOPLASM, MALIGNANT, THYROID GLAND 12/15/2006    Stage 1 papillary adenocarcinoma, one very small focus each lobe (33mm, 88mm)  6/09: CT neck: no evidence of recurrence  . OBSTRUCTIVE SLEEP APNEA 02/23/2007  . Chronic kidney disease     Family History  Problem Relation Age of Onset  . Cancer Other   . COPD Other   . Hypertension Other   . Hyperlipidemia Other   . Stroke Other   . Heart disease Other   . Diabetes Other     History   Social History  . Marital Status: Widowed    Spouse Name: N/A    Number of Children: N/A  . Years of Education: N/A   Occupational History  . Not on file.   Social History Main Topics  . Smoking status: Former Research scientist (life sciences)    . Smokeless tobacco: Not on file  . Alcohol Use: Not on file  . Drug Use: Not on file  . Sexual Activity: Not on file   Other Topics Concern  . Not on file   Social History Narrative  . No narrative on file    Past Surgical History  Procedure Laterality Date  . Thyroidectomy  2008  . Cholecystectomy        (Not in a hospital admission)  Review of systems complete and found to be negative unless listed above .  No nausea, vomiting.  No fever chills, No focal weakness,  No palpitations.  Physical Exam: Filed Vitals:   12/08/13 0924  BP: 110/80  Pulse: 52    Weight: 198 lb (89.812 kg)  Physical exam:  [place]/AT EOMI No JVD, No carotid bruit RRR S1S2  No wheezing Soft. NT, nondistended No edema. No focal motor or sensory deficits Normal affect  Labs:   Lab Results  Component Value Date   WBC 8.6 03/18/2010   HGB 12.7 03/18/2010   HCT 36.4 03/18/2010   MCV 91.1 03/18/2010   PLT 311 03/18/2010    Recent Labs Lab 12/01/13 1022  NA 137  K 3.3*  CL 95*  CO2 29  BUN 68*  CREATININE 5.3*  CALCIUM 8.5  GLUCOSE 95   Lab Results  Component Value Date   CKTOTAL 466* 12/15/2006    Lab Results  Component Value Date   CHOL 330 03/18/2010   Lab Results  Component Value Date   HDL 47 03/18/2010   No results found for this basename: LDLCALC   No results found for this basename: TRIG   No results found for this basename: CHOLHDL   Lab Results  Component Value Date   LDLDIRECT 164 03/18/2010       EKG: Sinus bradycardia. Prolonged PR interval. Diffuse inferior and lateral T wave changes.  ASSESSMENT AND PLAN:  Signed: 1) CAD: Known RCA occlusion.  WIll start SL NTG as needed. No angina at this time.  Would not pursue ischemia workup at this time. She has no symptoms and given her severe renal insufficiency, we would avoid cardiac cath unless it was an emergency. 2) Hyperlipidemia: LDL 138 while on atorvastatin 20 mg daily.  Increase atorvastatin 40 mg daily 3) Renal  insufficiency: She has a GFR of 10.5. I had talked to her about dialysis  briefly. She is unsure. She does state that Dr. Florene Glen has brought this up but she feels like it is "over her head." She is clearly hesitant and does not want dialysis, but I'm not sure if she has absorbed what was told to her about dialysis. I recommended that she make a decision about what she would want for her future care soon. She'll be following up with Dr. Florene Glen this month. 4) HTN: Controlled. I suspect she is off the ACE inhibitor due to her renal insufficiency. If her blood pressure remains low, would try to decrease her clonidine dose to 0.1 mg 3 times a day.  Mina Marble, MD, [facility] 12/08/2013, 9:39 AM

## 2013-12-26 ENCOUNTER — Emergency Department (HOSPITAL_COMMUNITY)
Admission: EM | Admit: 2013-12-26 | Discharge: 2013-12-26 | Disposition: A | Discharge status: Home / Self Care | Payer: Medicare Other | Attending: Emergency Medicine | Admitting: Emergency Medicine

## 2013-12-26 ENCOUNTER — Encounter (HOSPITAL_COMMUNITY): Payer: Self-pay | Admitting: Emergency Medicine

## 2013-12-26 DIAGNOSIS — E1122 Type 2 diabetes mellitus with diabetic chronic kidney disease: Secondary | ICD-10-CM | POA: Diagnosis not present

## 2013-12-26 DIAGNOSIS — Z87891 Personal history of nicotine dependence: Secondary | ICD-10-CM | POA: Diagnosis not present

## 2013-12-26 DIAGNOSIS — Z8669 Personal history of other diseases of the nervous system and sense organs: Secondary | ICD-10-CM | POA: Diagnosis not present

## 2013-12-26 DIAGNOSIS — N179 Acute kidney failure, unspecified: Secondary | ICD-10-CM | POA: Diagnosis not present

## 2013-12-26 DIAGNOSIS — E785 Hyperlipidemia, unspecified: Secondary | ICD-10-CM | POA: Insufficient documentation

## 2013-12-26 DIAGNOSIS — G4700 Insomnia, unspecified: Secondary | ICD-10-CM | POA: Diagnosis not present

## 2013-12-26 DIAGNOSIS — R05 Cough: Secondary | ICD-10-CM | POA: Diagnosis not present

## 2013-12-26 DIAGNOSIS — I10 Essential (primary) hypertension: Secondary | ICD-10-CM | POA: Diagnosis not present

## 2013-12-26 DIAGNOSIS — Z7982 Long term (current) use of aspirin: Secondary | ICD-10-CM | POA: Insufficient documentation

## 2013-12-26 DIAGNOSIS — R251 Tremor, unspecified: Secondary | ICD-10-CM | POA: Diagnosis not present

## 2013-12-26 DIAGNOSIS — E119 Type 2 diabetes mellitus without complications: Secondary | ICD-10-CM | POA: Diagnosis not present

## 2013-12-26 DIAGNOSIS — Z85850 Personal history of malignant neoplasm of thyroid: Secondary | ICD-10-CM | POA: Insufficient documentation

## 2013-12-26 DIAGNOSIS — Z0000 Encounter for general adult medical examination without abnormal findings: Secondary | ICD-10-CM | POA: Diagnosis not present

## 2013-12-26 DIAGNOSIS — Z79899 Other long term (current) drug therapy: Secondary | ICD-10-CM | POA: Insufficient documentation

## 2013-12-26 DIAGNOSIS — I2510 Atherosclerotic heart disease of native coronary artery without angina pectoris: Secondary | ICD-10-CM | POA: Diagnosis not present

## 2013-12-26 DIAGNOSIS — I509 Heart failure, unspecified: Secondary | ICD-10-CM | POA: Diagnosis not present

## 2013-12-26 DIAGNOSIS — I129 Hypertensive chronic kidney disease with stage 1 through stage 4 chronic kidney disease, or unspecified chronic kidney disease: Secondary | ICD-10-CM | POA: Insufficient documentation

## 2013-12-26 DIAGNOSIS — E209 Hypoparathyroidism, unspecified: Secondary | ICD-10-CM | POA: Diagnosis not present

## 2013-12-26 DIAGNOSIS — Z794 Long term (current) use of insulin: Secondary | ICD-10-CM | POA: Diagnosis not present

## 2013-12-26 DIAGNOSIS — N189 Chronic kidney disease, unspecified: Secondary | ICD-10-CM | POA: Diagnosis not present

## 2013-12-26 DIAGNOSIS — N184 Chronic kidney disease, stage 4 (severe): Secondary | ICD-10-CM | POA: Diagnosis not present

## 2013-12-26 DIAGNOSIS — Z23 Encounter for immunization: Secondary | ICD-10-CM | POA: Diagnosis not present

## 2013-12-26 DIAGNOSIS — R7989 Other specified abnormal findings of blood chemistry: Secondary | ICD-10-CM | POA: Diagnosis present

## 2013-12-26 DIAGNOSIS — E782 Mixed hyperlipidemia: Secondary | ICD-10-CM | POA: Diagnosis not present

## 2013-12-26 DIAGNOSIS — Z8659 Personal history of other mental and behavioral disorders: Secondary | ICD-10-CM | POA: Diagnosis not present

## 2013-12-26 LAB — CBC
HCT: 31.4 % — ABNORMAL LOW (ref 36.0–46.0)
HEMOGLOBIN: 10.4 g/dL — AB (ref 12.0–15.0)
MCH: 29.1 pg (ref 26.0–34.0)
MCHC: 33.1 g/dL (ref 30.0–36.0)
MCV: 87.7 fL (ref 78.0–100.0)
Platelets: 325 10*3/uL (ref 150–400)
RBC: 3.58 MIL/uL — ABNORMAL LOW (ref 3.87–5.11)
RDW: 13.5 % (ref 11.5–15.5)
WBC: 11.6 10*3/uL — ABNORMAL HIGH (ref 4.0–10.5)

## 2013-12-26 LAB — COMPREHENSIVE METABOLIC PANEL
ALBUMIN: 3.9 g/dL (ref 3.5–5.2)
ALT: 35 U/L (ref 0–35)
AST: 29 U/L (ref 0–37)
Alkaline Phosphatase: 87 U/L (ref 39–117)
Anion gap: 23 — ABNORMAL HIGH (ref 5–15)
BUN: 75 mg/dL — ABNORMAL HIGH (ref 6–23)
CO2: 22 mEq/L (ref 19–32)
Calcium: 6.8 mg/dL — ABNORMAL LOW (ref 8.4–10.5)
Chloride: 93 mEq/L — ABNORMAL LOW (ref 96–112)
Creatinine, Ser: 5.26 mg/dL — ABNORMAL HIGH (ref 0.50–1.10)
GFR calc non Af Amer: 8 mL/min — ABNORMAL LOW (ref >90)
GFR, EST AFRICAN AMERICAN: 9 mL/min — AB (ref >90)
GLUCOSE: 192 mg/dL — AB (ref 70–99)
Potassium: 3.8 mEq/L (ref 3.7–5.3)
Sodium: 138 mEq/L (ref 137–147)
Total Bilirubin: 0.3 mg/dL (ref 0.3–1.2)
Total Protein: 8.8 g/dL — ABNORMAL HIGH (ref 6.0–8.3)

## 2013-12-26 NOTE — ED Notes (Signed)
Pt sent from PCP for altered kidney function levels. Pt reports her yearly appointment was this am at 0900. Pt reports she feels achy. Pt has renal doctor who has been monitoring her kidneys.

## 2013-12-26 NOTE — Discharge Instructions (Signed)
Chronic Kidney Disease °Chronic kidney disease occurs when the kidneys are damaged over a long period. The kidneys are two organs that lie on either side of the spine between the middle of the back and the front of the abdomen. The kidneys:  °· Remove wastes and extra water from the blood.   °· Produce important hormones. These help keep bones strong, regulate blood pressure, and help create red blood cells.   °· Balance the fluids and chemicals in the blood and tissues. °A small amount of kidney damage may not cause problems, but a large amount of damage may make it difficult or impossible for the kidneys to work the way they should. If steps are not taken to slow down the kidney damage or stop it from getting worse, the kidneys may stop working permanently. Most of the time, chronic kidney disease does not go away. However, it can often be controlled, and those with the disease can usually live normal lives. °CAUSES  °The most common causes of chronic kidney disease are diabetes and high blood pressure (hypertension). Chronic kidney disease may also be caused by:  °· Diseases that cause the kidneys' filters to become inflamed.   °· Diseases that affect the immune system.   °· Genetic diseases.   °· Medicines that damage the kidneys, such as anti-inflammatory medicines.   °· Poisoning or exposure to toxic substances.   °· A reoccurring kidney or urinary infection.   °· A problem with urine flow. This may be caused by:   °¨ Cancer.   °¨ Kidney stones.   °¨ An enlarged prostate in males. °SIGNS AND SYMPTOMS  °Because the kidney damage in chronic kidney disease occurs slowly, symptoms develop slowly and may not be obvious until the kidney damage becomes severe. A person may have a kidney disease for years without showing any symptoms. Symptoms can include:  °· Swelling (edema) of the legs, ankles, or feet.   °· Tiredness (lethargy).   °· Nausea or vomiting.   °· Confusion.   °· Problems with urination, such as:    °¨ Decreased urine production.   °¨ Frequent urination, especially at night.   °¨ Frequent accidents in children who are potty trained.   °· Muscle twitches and cramps.   °· Shortness of breath.  °· Weakness.   °· Persistent itchiness.   °· Loss of appetite. °· Metallic taste in the mouth. °· Trouble sleeping. °· Slowed development in children. °· Short stature in children. °DIAGNOSIS  °Chronic kidney disease may be detected and diagnosed by tests, including blood, urine, imaging, or kidney biopsy tests.  °TREATMENT  °Most chronic kidney diseases cannot be cured. Treatment usually involves relieving symptoms and preventing or slowing the progression of the disease. Treatment may include:  °· A special diet. You may need to avoid alcohol and foods that are salty and high in potassium.   °· Medicines. These may:   °¨ Lower blood pressure.   °¨ Relieve anemia.   °¨ Relieve swelling.   °¨ Protect the bones. °HOME CARE INSTRUCTIONS  °· Follow your prescribed diet.   °· Take medicines only as directed by your health care provider. Do not take any new medicines (prescription, over-the-counter, or nutritional supplements) unless approved by your health care provider. Many medicines can worsen your kidney damage or need to have the dose adjusted.   °· Quit smoking if you smoke. Talk to your health care provider about a smoking cessation program.   °· Keep all follow-up visits as directed by your health care provider. °SEEK IMMEDIATE MEDICAL CARE IF: °· Your symptoms get worse or you develop new symptoms.   °· You develop symptoms of end-stage kidney disease. These   include:   °¨ Headaches.   °¨ Abnormally dark or light skin.   °¨ Numbness in the hands or feet.   °¨ Easy bruising.   °¨ Frequent hiccups.   °¨ Menstruation stops.   °· You have a fever.   °· You have decreased urine production.   °· You have pain or bleeding when urinating. °MAKE SURE YOU: °· Understand these instructions. °· Will watch your condition. °· Will  get help right away if you are not doing well or get worse. °FOR MORE INFORMATION  °· American Association of Kidney Patients: www.aakp.org °· National Kidney Foundation: www.kidney.org °· American Kid[employer] Fund: www.akfinc.org °· [employer] [facility]: www.lifeoptions.org and www.kidneyschool.org °Document Released: 12/04/2007 Document Revised: 07/11/2013 Document Reviewed: 10/24/2011 °[employer]® Patient Information ©2015 [employer]. This information is not intended to replace advice given to you by your health care provider. Make sure you discuss any questions you have with your health care provider. ° °

## 2013-12-26 NOTE — ED Provider Notes (Signed)
CSN: [id number]     Arrival date & time 12/26/13  1504 History   First MD Initiated Contact with Patient 12/26/13 1608     Chief Complaint  Patient presents with  . Abnormal Lab    kidney function     (Consider location/radiation/quality/duration/timing/severity/associated sxs/prior Treatment) Patient is a 63 y.o. female presenting with general illness. The history is provided by the patient.  Illness Location:  Worsening creatinine Quality:  Worsening Severity:  Moderate Onset quality:  Gradual Timing:  Constant Progression:  Unchanged Chronicity:  Chronic Context:  Worsening creatinine on basic labs at a routine checkup Relieved by:  Nothing Worsened by:  Nothing Associated symptoms: fatigue   Associated symptoms: no abdominal pain, no cough, no fever, no shortness of breath and no vomiting     Past Medical History  Diagnosis Date  . CONGESTIVE HEART FAILURE 12/14/2006  . CORONARY ARTERY DISEASE 12/14/2006  . DIABETES MELLITUS, TYPE II 12/14/2006  . HYPERLIPIDEMIA 12/14/2006  . HYPERTENSION 12/14/2006  . Hypoparathyroidism 12/21/2006  . Hypopotassemia 01/13/2007  . HYPOTHYROIDISM, POSTSURGICAL 12/15/2006  . NEOPLASM, MALIGNANT, THYROID GLAND 12/15/2006    Stage 1 papillary adenocarcinoma, one very small focus each lobe (30mm, 15mm)  6/09: CT neck: no evidence of recurrence  . OBSTRUCTIVE SLEEP APNEA 02/23/2007  . Chronic kidney disease    Past Surgical History  Procedure Laterality Date  . Thyroidectomy  2008  . Cholecystectomy     Family History  Problem Relation Age of Onset  . Cancer Other   . COPD Other   . Hypertension Other   . Hyperlipidemia Other   . Stroke Other   . Heart disease Other   . Diabetes Other   . Heart failure Mother    History  Substance Use Topics  . Smoking status: Former Research scientist (life sciences)  . Smokeless tobacco: Not on file  . Alcohol Use: Not on file   OB History   Grav Para Term Preterm Abortions TAB SAB Ect Mult Living                  Review of Systems  Constitutional: Positive for fatigue. Negative for fever and chills.  Respiratory: Negative for cough and shortness of breath.   Gastrointestinal: Negative for vomiting and abdominal pain.  All other systems reviewed and are negative.     Allergies  Review of patient's allergies indicates no known allergies.  Home Medications   Prior to Admission medications   Medication Sig Start Date End Date Taking? Authorizing Provider  amLODipine (NORVASC) 10 MG tablet Take 10 mg by mouth daily.   Yes Historical Provider, MD  aspirin (BAYER LOW STRENGTH) 81 MG EC tablet Take 81 mg by mouth daily.     Yes Historical Provider, MD  atorvastatin (LIPITOR) 40 MG tablet Take 1 tablet (40 mg total) by mouth daily. 12/08/13  Yes Jettie Booze, MD  cholecalciferol (VITAMIN D) 1000 UNITS tablet Take 1,000 Units by mouth daily.   Yes Historical Provider, MD  cloNIDine (CATAPRES) 0.2 MG tablet Take 0.2 mg by mouth 3 (three) times daily.    Yes Historical Provider, MD  doxazosin (CARDURA) 2 MG tablet Take 2 mg by mouth daily.   Yes Historical Provider, MD  fish oil-omega-3 fatty acids 1000 MG capsule Take 1 capsule by mouth daily.     Yes Historical Provider, MD  furosemide (LASIX) 40 MG tablet Take 40 mg by mouth daily.   Yes Historical Provider, MD  glucose blood (ONETOUCH VERIO) test strip 1 each  by Other route 2 (two) times daily. And lancets 2/day 250.01 11/26/12  Yes Renato Shin, MD  insulin NPH-regular Human (NOVOLIN 70/30) (70-30) 100 UNIT/ML injection Inject 50 Units into the skin daily with breakfast. 07/26/13  Yes Renato Shin, MD  Insulin Pen Needle 32G X 4 MM MISC 1 x day 07/06/13  Yes Renato Shin, MD  Insulin Syringe-Needle U-100 (B-D INSULIN SYRINGE) 31G X 5/16" 0.3 ML MISC Use 1 time per day. 07/20/13  Yes Renato Shin, MD  levothyroxine (SYNTHROID, LEVOTHROID) 100 MCG tablet Take 100 mcg by mouth daily before breakfast.   Yes Historical Provider, MD  ONE TOUCH LANCETS  MISC Use 2 times daily to check blood sugar. Please dispense as Donaciano Eva DX code [id number]  Yes Renato Shin, MD  SYRINGE-NEEDLE, DISP, 3 ML (BD ECLIPSE SYRINGE) 25G X 5/8" 3 ML MISC Use 1 time per day. 07/20/13  Yes Renato Shin, MD  triazolam (HALCION) 0.25 MG tablet 2 tablets by mouth at bedtime    Yes Historical Provider, MD  nitroGLYCERIN (NITROSTAT) 0.4 MG SL tablet Place 1 tablet (0.4 mg total) under the tongue every 5 (five) minutes as needed. 12/08/13   Jettie Booze, MD   BP 144/75  Pulse 91  Temp(Src) 97.8 F (36.6 C) (Oral)  Resp 16  SpO2 100% Physical Exam  Nursing note and vitals reviewed. Constitutional: She is oriented to person, place, and time. She appears well-developed and well-nourished. No distress.  HENT:  Head: Normocephalic and atraumatic.  Mouth/Throat: Oropharynx is clear and moist.  Eyes: EOM are normal. Pupils are equal, round, and reactive to light.  Neck: Normal range of motion. Neck supple.  Cardiovascular: Normal rate and regular rhythm.  Exam reveals no friction rub.   No murmur heard. Pulmonary/Chest: Effort normal and breath sounds normal. No respiratory distress. She has no wheezes. She has no rales.  Abdominal: Soft. She exhibits no distension. There is no tenderness. There is no rebound.  Musculoskeletal: Normal range of motion. She exhibits no edema.  Neurological: She is alert and oriented to person, place, and time. No cranial nerve deficit. She exhibits normal muscle tone. Coordination normal.  Skin: No rash noted. She is not diaphoretic.    ED Course  Procedures (including critical care time) Labs Review Labs Reviewed  CBC - Abnormal; Notable for the following:    WBC 11.6 (*)    RBC 3.58 (*)    Hemoglobin 10.4 (*)    HCT 31.4 (*)    All other components within normal limits  COMPREHENSIVE METABOLIC PANEL - Abnormal; Notable for the following:    Chloride 93 (*)    Glucose, Bld 192 (*)    BUN 75 (*)    Creatinine, Ser 5.26  (*)    Calcium 6.8 (*)    Total Protein 8.8 (*)    GFR calc non Af Amer 8 (*)    GFR calc Af Amer 9 (*)    Anion gap 23 (*)    All other components within normal limits    Imaging Review No results found.   EKG Interpretation None      MDM   Final diagnoses:  Acute on chronic renal failure    9F here with abnormal labs - hx of CHF, DM, HLD - sent here by PCP for elevated creatinine. Creatinine 5.3 today, previously in the mid 2s. Followed by [facility] - Dr. Florene Glen. Had standard labs at an annual checkup. Will speak with her nephrologist. Of note, had similar  creatinine 3.5 weeks ago when checked by her Endocrinologist.  I spoke with her nephrology group, she can f/u with them in 2-3 days for repeat labs. She is not volume overloaded, she does not have any major electrolyte abnormalities. Stable for discharge.    Evelina Bucy, MD 12/27/13 [phone]

## 2013-12-29 DIAGNOSIS — N183 Chronic kidney disease, stage 3 (moderate): Secondary | ICD-10-CM | POA: Diagnosis not present

## 2013-12-30 DIAGNOSIS — M109 Gout, unspecified: Secondary | ICD-10-CM | POA: Diagnosis not present

## 2013-12-30 DIAGNOSIS — Z1211 Encounter for screening for malignant neoplasm of colon: Secondary | ICD-10-CM | POA: Diagnosis not present

## 2014-01-02 ENCOUNTER — Telehealth: Payer: Self-pay | Admitting: Endocrinology

## 2014-01-02 MED ORDER — LEVOTHYROXINE SODIUM 100 MCG PO TABS: 100.0000 ug | ORAL_TABLET | Freq: Every day | ORAL | Status: DC

## 2014-01-02 NOTE — Telephone Encounter (Signed)
See below, medication is listed under historical provider and pt is currently being seen for DM. Ok to refill? Thanks!

## 2014-01-02 NOTE — Telephone Encounter (Signed)
Patient need refill of Levothyroxine 100 mg

## 2014-01-02 NOTE — Telephone Encounter (Signed)
Ok, please refill prn 

## 2014-01-02 NOTE — Telephone Encounter (Signed)
Rx sent to pharmacy   

## 2014-01-05 DIAGNOSIS — E209 Hypoparathyroidism, unspecified: Secondary | ICD-10-CM | POA: Diagnosis not present

## 2014-01-05 DIAGNOSIS — N185 Chronic kidney disease, stage 5: Secondary | ICD-10-CM | POA: Diagnosis not present

## 2014-01-10 ENCOUNTER — Other Ambulatory Visit: Payer: Self-pay | Admitting: *Deleted

## 2014-01-10 DIAGNOSIS — Z01810 Encounter for preprocedural cardiovascular examination: Secondary | ICD-10-CM

## 2014-01-10 DIAGNOSIS — N185 Chronic kidney disease, stage 5: Secondary | ICD-10-CM

## 2014-01-19 ENCOUNTER — Emergency Department (HOSPITAL_COMMUNITY): Payer: Medicare Other

## 2014-01-19 ENCOUNTER — Inpatient Hospital Stay (HOSPITAL_COMMUNITY)
Admission: EM | Admit: 2014-01-19 | Discharge: 2014-01-22 | DRG: 641 | Disposition: A | Discharge status: Home / Self Care | Payer: Medicare Other | Attending: Internal Medicine | Admitting: Internal Medicine

## 2014-01-19 ENCOUNTER — Encounter (HOSPITAL_COMMUNITY): Payer: Self-pay

## 2014-01-19 DIAGNOSIS — R531 Weakness: Secondary | ICD-10-CM

## 2014-01-19 DIAGNOSIS — Z8249 Family history of ischemic heart disease and other diseases of the circulatory system: Secondary | ICD-10-CM

## 2014-01-19 DIAGNOSIS — I120 Hypertensive chronic kidney disease with stage 5 chronic kidney disease or end stage renal disease: Secondary | ICD-10-CM | POA: Diagnosis present

## 2014-01-19 DIAGNOSIS — Z6834 Body mass index (BMI) 34.0-34.9, adult: Secondary | ICD-10-CM

## 2014-01-19 DIAGNOSIS — I2510 Atherosclerotic heart disease of native coronary artery without angina pectoris: Secondary | ICD-10-CM | POA: Diagnosis present

## 2014-01-19 DIAGNOSIS — E1029 Type 1 diabetes mellitus with other diabetic kidney complication: Secondary | ICD-10-CM | POA: Diagnosis not present

## 2014-01-19 DIAGNOSIS — Z7982 Long term (current) use of aspirin: Secondary | ICD-10-CM | POA: Diagnosis not present

## 2014-01-19 DIAGNOSIS — E1122 Type 2 diabetes mellitus with diabetic chronic kidney disease: Secondary | ICD-10-CM | POA: Diagnosis not present

## 2014-01-19 DIAGNOSIS — E785 Hyperlipidemia, unspecified: Secondary | ICD-10-CM | POA: Diagnosis present

## 2014-01-19 DIAGNOSIS — E209 Hypoparathyroidism, unspecified: Secondary | ICD-10-CM | POA: Diagnosis present

## 2014-01-19 DIAGNOSIS — N185 Chronic kidney disease, stage 5: Secondary | ICD-10-CM | POA: Diagnosis present

## 2014-01-19 DIAGNOSIS — Z794 Long term (current) use of insulin: Secondary | ICD-10-CM

## 2014-01-19 DIAGNOSIS — E890 Postprocedural hypothyroidism: Secondary | ICD-10-CM | POA: Diagnosis present

## 2014-01-19 DIAGNOSIS — N184 Chronic kidney disease, stage 4 (severe): Secondary | ICD-10-CM | POA: Diagnosis not present

## 2014-01-19 DIAGNOSIS — D649 Anemia, unspecified: Secondary | ICD-10-CM | POA: Diagnosis present

## 2014-01-19 DIAGNOSIS — Z825 Family history of asthma and other chronic lower respiratory diseases: Secondary | ICD-10-CM | POA: Diagnosis not present

## 2014-01-19 DIAGNOSIS — Z823 Family history of stroke: Secondary | ICD-10-CM

## 2014-01-19 DIAGNOSIS — Z992 Dependence on renal dialysis: Secondary | ICD-10-CM

## 2014-01-19 DIAGNOSIS — E1065 Type 1 diabetes mellitus with hyperglycemia: Secondary | ICD-10-CM | POA: Diagnosis present

## 2014-01-19 DIAGNOSIS — G4733 Obstructive sleep apnea (adult) (pediatric): Secondary | ICD-10-CM | POA: Diagnosis present

## 2014-01-19 DIAGNOSIS — Z79899 Other long term (current) drug therapy: Secondary | ICD-10-CM | POA: Diagnosis not present

## 2014-01-19 DIAGNOSIS — N189 Chronic kidney disease, unspecified: Secondary | ICD-10-CM

## 2014-01-19 DIAGNOSIS — N186 End stage renal disease: Secondary | ICD-10-CM | POA: Diagnosis present

## 2014-01-19 DIAGNOSIS — Z87891 Personal history of nicotine dependence: Secondary | ICD-10-CM | POA: Diagnosis not present

## 2014-01-19 DIAGNOSIS — E1022 Type 1 diabetes mellitus with diabetic chronic kidney disease: Secondary | ICD-10-CM | POA: Diagnosis present

## 2014-01-19 DIAGNOSIS — Z833 Family history of diabetes mellitus: Secondary | ICD-10-CM

## 2014-01-19 DIAGNOSIS — I509 Heart failure, unspecified: Secondary | ICD-10-CM | POA: Diagnosis present

## 2014-01-19 LAB — HEPATIC FUNCTION PANEL
ALT: 20 U/L (ref 0–35)
AST: 21 U/L (ref 0–37)
Albumin: 4 g/dL (ref 3.5–5.2)
Alkaline Phosphatase: 86 U/L (ref 39–117)
Total Bilirubin: 0.5 mg/dL (ref 0.3–1.2)
Total Protein: 8.9 g/dL — ABNORMAL HIGH (ref 6.0–8.3)

## 2014-01-19 LAB — URINE MICROSCOPIC-ADD ON

## 2014-01-19 LAB — CBC WITH DIFFERENTIAL/PLATELET
BASOS PCT: 0 % (ref 0–1)
Basophils Absolute: 0 10*3/uL (ref 0.0–0.1)
EOS ABS: 0.1 10*3/uL (ref 0.0–0.7)
Eosinophils Relative: 1 % (ref 0–5)
HCT: 32 % — ABNORMAL LOW (ref 36.0–46.0)
Hemoglobin: 10.8 g/dL — ABNORMAL LOW (ref 12.0–15.0)
Lymphocytes Relative: 22 % (ref 12–46)
Lymphs Abs: 2.4 10*3/uL (ref 0.7–4.0)
MCH: 29.1 pg (ref 26.0–34.0)
MCHC: 33.8 g/dL (ref 30.0–36.0)
MCV: 86.3 fL (ref 78.0–100.0)
Monocytes Absolute: 1 10*3/uL (ref 0.1–1.0)
Monocytes Relative: 10 % (ref 3–12)
NEUTROS ABS: 7 10*3/uL (ref 1.7–7.7)
NEUTROS PCT: 67 % (ref 43–77)
Platelets: 328 10*3/uL (ref 150–400)
RBC: 3.71 MIL/uL — ABNORMAL LOW (ref 3.87–5.11)
RDW: 12.8 % (ref 11.5–15.5)
WBC: 10.5 10*3/uL (ref 4.0–10.5)

## 2014-01-19 LAB — URINALYSIS, ROUTINE W REFLEX MICROSCOPIC
BILIRUBIN URINE: NEGATIVE
Glucose, UA: 250 mg/dL — AB
KETONES UR: NEGATIVE mg/dL
Leukocytes, UA: NEGATIVE
NITRITE: NEGATIVE
PROTEIN: 100 mg/dL — AB
SPECIFIC GRAVITY, URINE: 1.013 (ref 1.005–1.030)
Urobilinogen, UA: 0.2 mg/dL (ref 0.0–1.0)
pH: 5.5 (ref 5.0–8.0)

## 2014-01-19 LAB — BASIC METABOLIC PANEL
ANION GAP: 19 — AB (ref 5–15)
BUN: 81 mg/dL — AB (ref 6–23)
CO2: 29 mEq/L (ref 19–32)
CREATININE: 5.81 mg/dL — AB (ref 0.50–1.10)
Calcium: >15 mg/dL (ref 8.4–10.5)
Chloride: 93 mEq/L — ABNORMAL LOW (ref 96–112)
GFR, EST AFRICAN AMERICAN: 8 mL/min — AB (ref >90)
GFR, EST NON AFRICAN AMERICAN: 7 mL/min — AB (ref >90)
Glucose, Bld: 206 mg/dL — ABNORMAL HIGH (ref 70–99)
Potassium: 3.7 mEq/L (ref 3.7–5.3)
Sodium: 141 mEq/L (ref 137–147)

## 2014-01-19 MED ORDER — FUROSEMIDE 40 MG PO TABS
40.0000 mg | ORAL_TABLET | Freq: Every day | ORAL | Status: DC
  Administered 2014-01-20: 40 mg via ORAL
  Filled 2014-01-19: qty 1

## 2014-01-19 MED ORDER — INSULIN ASPART PROT & ASPART (70-30 MIX) 100 UNIT/ML ~~LOC~~ SUSP
50.0000 [IU] | Freq: Every day | SUBCUTANEOUS | Status: DC
  Administered 2014-01-20 – 2014-01-22 (×3): 50 [IU] via SUBCUTANEOUS
  Filled 2014-01-19: qty 10

## 2014-01-19 MED ORDER — SODIUM CHLORIDE 0.9 % IV SOLN
INTRAVENOUS | Status: DC
  Administered 2014-01-20 – 2014-01-22 (×5): via INTRAVENOUS

## 2014-01-19 MED ORDER — ATORVASTATIN CALCIUM 40 MG PO TABS
40.0000 mg | ORAL_TABLET | Freq: Every day | ORAL | Status: DC
  Administered 2014-01-20 – 2014-01-21 (×2): 40 mg via ORAL
  Filled 2014-01-19 (×3): qty 1

## 2014-01-19 MED ORDER — SODIUM CHLORIDE 0.9 % IV BOLUS (SEPSIS)
1000.0000 mL | Freq: Once | INTRAVENOUS | Status: AC
  Administered 2014-01-19: 1000 mL via INTRAVENOUS

## 2014-01-19 MED ORDER — TRIAZOLAM 0.125 MG PO TABS
0.5000 mg | ORAL_TABLET | Freq: Every day | ORAL | Status: DC
  Administered 2014-01-20 – 2014-01-21 (×3): 0.5 mg via ORAL
  Filled 2014-01-19 (×3): qty 4

## 2014-01-19 MED ORDER — CLONIDINE HCL 0.2 MG PO TABS
0.2000 mg | ORAL_TABLET | Freq: Three times a day (TID) | ORAL | Status: DC
  Administered 2014-01-20 – 2014-01-22 (×8): 0.2 mg via ORAL
  Filled 2014-01-19 (×6): qty 1
  Filled 2014-01-19: qty 2
  Filled 2014-01-19 (×3): qty 1

## 2014-01-19 MED ORDER — ASPIRIN EC 81 MG PO TBEC
81.0000 mg | DELAYED_RELEASE_TABLET | Freq: Every day | ORAL | Status: DC
  Administered 2014-01-20 – 2014-01-22 (×3): 81 mg via ORAL
  Filled 2014-01-19 (×3): qty 1

## 2014-01-19 MED ORDER — DOXAZOSIN MESYLATE 2 MG PO TABS
2.0000 mg | ORAL_TABLET | Freq: Every day | ORAL | Status: DC
  Administered 2014-01-20 – 2014-01-22 (×3): 2 mg via ORAL
  Filled 2014-01-19 (×3): qty 1

## 2014-01-19 MED ORDER — OMEGA-3-ACID ETHYL ESTERS 1 G PO CAPS
1.0000 g | ORAL_CAPSULE | Freq: Every day | ORAL | Status: DC
  Administered 2014-01-20 – 2014-01-22 (×3): 1 g via ORAL
  Filled 2014-01-19 (×3): qty 1

## 2014-01-19 MED ORDER — AMLODIPINE BESYLATE 10 MG PO TABS
10.0000 mg | ORAL_TABLET | Freq: Every day | ORAL | Status: DC
  Administered 2014-01-20 – 2014-01-22 (×3): 10 mg via ORAL
  Filled 2014-01-19 (×3): qty 1

## 2014-01-19 MED ORDER — LEVOTHYROXINE SODIUM 100 MCG PO TABS
100.0000 ug | ORAL_TABLET | Freq: Every day | ORAL | Status: DC
  Administered 2014-01-20 – 2014-01-22 (×3): 100 ug via ORAL
  Filled 2014-01-19 (×4): qty 1

## 2014-01-19 MED ORDER — HEPARIN SODIUM (PORCINE) 5000 UNIT/ML IJ SOLN
5000.0000 [IU] | Freq: Three times a day (TID) | INTRAMUSCULAR | Status: DC
  Administered 2014-01-20 – 2014-01-22 (×8): 5000 [IU] via SUBCUTANEOUS
  Filled 2014-01-19 (×11): qty 1

## 2014-01-19 NOTE — H&P (Signed)
[facility] History and Physical  CHARMAN SPROULS [id number] DOB: 04-Aug-1950 DOA: 01/19/2014  Referring physician: EDP PCP: Gara Kroner, MD   Chief Complaint: Generalized weakness   HPI: Sandy Roy is a 63 y.o. female longstanding history of poorly controlled DM resulting now in CKD stage 4-5 over past couple of months, who presents with generalized weakness over the past few days.  She has numerous medical problems including hypocalcemia for which she has added 3 calcium tablets for in the past week apparently.  Over the past few days she began to feal week and so presents to the ED.  In ED calcium is very high >15.  Review of Systems: Systems reviewed.  As above, otherwise negative  Past Medical History  Diagnosis Date  . CONGESTIVE HEART FAILURE 12/14/2006  . CORONARY ARTERY DISEASE 12/14/2006  . DIABETES MELLITUS, TYPE II 12/14/2006  . HYPERLIPIDEMIA 12/14/2006  . HYPERTENSION 12/14/2006  . Hypoparathyroidism 12/21/2006  . Hypopotassemia 01/13/2007  . HYPOTHYROIDISM, POSTSURGICAL 12/15/2006  . NEOPLASM, MALIGNANT, THYROID GLAND 12/15/2006    Stage 1 papillary adenocarcinoma, one very small focus each lobe (72mm, 12mm)  6/09: CT neck: no evidence of recurrence  . OBSTRUCTIVE SLEEP APNEA 02/23/2007  . Chronic kidney disease    Past Surgical History  Procedure Laterality Date  . Thyroidectomy  2008  . Cholecystectomy     Social History:  reports that she has quit smoking. She does not have any smokeless tobacco history on file. She reports that she does not drink alcohol or use illicit drugs.  No Known Allergies  Family History  Problem Relation Age of Onset  . Cancer Other   . COPD Other   . Hypertension Other   . Hyperlipidemia Other   . Stroke Other   . Heart disease Other   . Diabetes Other   . Heart failure Mother      Prior to Admission medications   Medication Sig Start Date End Date Taking? Authorizing Provider  amLODipine (NORVASC) 10 MG  tablet Take 10 mg by mouth daily.   Yes Historical Provider, MD  aspirin (BAYER LOW STRENGTH) 81 MG EC tablet Take 81 mg by mouth daily.     Yes Historical Provider, MD  atorvastatin (LIPITOR) 40 MG tablet Take 1 tablet (40 mg total) by mouth daily. 12/08/13  Yes Jettie Booze, MD  calcitRIOL (ROCALTROL) 0.25 MCG capsule Take 0.25 mcg by mouth daily.   Yes Historical Provider, MD  calcium carbonate (OS-CAL - DOSED IN MG OF ELEMENTAL CALCIUM) 1250 MG tablet Take 3 tablets by mouth 3 (three) times daily with meals.   Yes Historical Provider, MD  cloNIDine (CATAPRES) 0.2 MG tablet Take 0.2 mg by mouth 3 (three) times daily.    Yes Historical Provider, MD  doxazosin (CARDURA) 2 MG tablet Take 2 mg by mouth daily.   Yes Historical Provider, MD  fish oil-omega-3 fatty acids 1000 MG capsule Take 1 capsule by mouth daily.     Yes Historical Provider, MD  furosemide (LASIX) 40 MG tablet Take 40 mg by mouth daily.   Yes Historical Provider, MD  glucose blood (ONETOUCH VERIO) test strip 1 each by Other route 2 (two) times daily. And lancets 2/day 250.01 11/26/12  Yes Renato Shin, MD  insulin NPH-regular Human (NOVOLIN 70/30) (70-30) 100 UNIT/ML injection Inject 50 Units into the skin daily with breakfast. 07/26/13  Yes Renato Shin, MD  Insulin Syringe-Needle U-100 (B-D INSULIN SYRINGE) 31G X 5/16" 0.3 ML MISC Use 1 time per  day. 07/20/13  Yes Renato Shin, MD  levothyroxine (SYNTHROID, LEVOTHROID) 100 MCG tablet Take 1 tablet (100 mcg total) by mouth daily before breakfast. 01/02/14  Yes Renato Shin, MD  Multiple Vitamins-Minerals (MULTIVITAMIN & MINERAL PO) Take 1 tablet by mouth daily.   Yes Historical Provider, MD  triazolam (HALCION) 0.25 MG tablet 2 tablets by mouth at bedtime    Yes Historical Provider, MD  nitroGLYCERIN (NITROSTAT) 0.4 MG SL tablet Place 1 tablet (0.4 mg total) under the tongue every 5 (five) minutes as needed. 12/08/13   Jettie Booze, MD   Physical Exam: Filed Vitals:    01/19/14 1849  BP: 159/61  Pulse: 63  Temp: 98 F (36.7 C)  Resp: 18    BP 159/61 mmHg  Pulse 63  Temp(Src) 98 F (36.7 C) (Oral)  Resp 18  SpO2 97%  General Appearance:    Alert, oriented, no distress, appears stated age  Head:    Normocephalic, atraumatic  Eyes:    PERRL, EOMI, sclera non-icteric        Nose:   Nares without drainage or epistaxis. Mucosa, turbinates normal  Throat:   Moist mucous membranes. Oropharynx without erythema or exudate.  Neck:   Supple. No carotid bruits.  No thyromegaly.  No lymphadenopathy.   Back:     No CVA tenderness, no spinal tenderness  Lungs:     Clear to auscultation bilaterally, without wheezes, rhonchi or rales  Chest wall:    No tenderness to palpitation  Heart:    Regular rate and rhythm without murmurs, gallops, rubs  Abdomen:     Soft, non-tender, nondistended, normal bowel sounds, no organomegaly  Genitalia:    deferred  Rectal:    deferred  Extremities:   No clubbing, cyanosis or edema.  Pulses:   2+ and symmetric all extremities  Skin:   Skin color, texture, turgor normal, no rashes or lesions  Lymph nodes:   Cervical, supraclavicular, and axillary nodes normal  Neurologic:   CNII-XII intact. Normal strength, sensation and reflexes      throughout    Labs on Admission:  Basic Metabolic Panel:  Recent Labs Lab 01/19/14 1905  NA 141  K 3.7  CL 93*  CO2 29  GLUCOSE 206*  BUN 81*  CREATININE 5.81*  CALCIUM >15.0*   Liver Function Tests:  Recent Labs Lab 01/19/14 1905  AST 21  ALT 20  ALKPHOS 86  BILITOT 0.5  PROT 8.9*  ALBUMIN 4.0   No results for input(s): LIPASE, AMYLASE in the last 168 hours. No results for input(s): AMMONIA in the last 168 hours. CBC:  Recent Labs Lab 01/19/14 1905  WBC 10.5  NEUTROABS 7.0  HGB 10.8*  HCT 32.0*  MCV 86.3  PLT 328   Cardiac Enzymes: No results for input(s): CKTOTAL, CKMB, CKMBINDEX, TROPONINI in the last 168 hours.  BNP (last 3 results) No results for  input(s): PROBNP in the last 8760 hours. CBG: No results for input(s): GLUCAP in the last 168 hours.  Radiological Exams on Admission: Dg Chest 2 View  01/19/2014   CLINICAL DATA:  Weakness 2 weeks.  EXAM: CHEST  2 VIEW  COMPARISON:  09/15/2006  FINDINGS: Lungs are adequately inflated and otherwise clear. Cardiomediastinal silhouette and remainder of the exam is unremarkable.  IMPRESSION: No active cardiopulmonary disease.   Electronically Signed   By: Marin Olp M.D.   On: 01/19/2014 19:30    EKG: Independently reviewed.  Assessment/Plan Principal Problem:   Hypercalcemia Active Problems:  HYPOTHYROIDISM, POSTSURGICAL   Hypoparathyroidism   Type 1 diabetes mellitus with renal manifestations   CKD stage 4 due to type 2 diabetes mellitus   1. Hypercalcemia - 1. IVF and continue home dose of lasix per nephrology.  Will have to see how she responds to this given kidney function (discussion below) 2. Stop all calcium medications at this time and calcitrol 3. Check PO4 2. H/o hypoparathyroidism 1. Recheck PTH 3. CKD stage 4-5 1. Nephrology consulted and will see patient at [facility], recommended IVF to try and get calcium down, has gotten 2L in ED, will continue IVF at 125 cc/hr. 2. Most likely patient will require starting dialysis in the coming weeks to months at most. 3. Has work with vascular surgery scheduled for later this month re: access 4. Patient is understandably very unhappy about the likely need for dialysis in the near future, but states she does want it if it is absolutely needed. 4. IDDM - 1. Patient is currently being treated with once daily 70/30 50 units with breakfast, she is under the care of endocrinology.  Although NPH is normally a 12 hour insulin, I suspect this is working for her due to the increased half life of insulin in severe kidney dysfunction. 2. Will continue home regimen with CBG checks AC/HS 3. I suspect she likely will need regimen adjustment if and  when patient is started on dialysis in the future.    Code Status: Full Code  Family Communication: No family in room Disposition Plan: Admit to inpatient   Time spent: 70 min  GARDNER, JARED M. [facility] Pager [phone]  If 7AM-7PM, please contact the day team taking care of the patient [email or web address] Password [email or web address] 01/19/2014, 11:18 PM

## 2014-01-19 NOTE — ED Notes (Signed)
Below order not completed by EW. 

## 2014-01-19 NOTE — ED Notes (Signed)
Please call pt's daughter with any updates. Michaela Corner Creeden [phone]

## 2014-01-19 NOTE — ED Provider Notes (Signed)
CSN: [id number]     Arrival date & time 01/19/14  1812 History   First MD Initiated Contact with Patient 01/19/14 2022     Chief Complaint  Patient presents with  . Weakness   HPI  Patient presents with weakness.  Symptoms been present for some time, though noticeably worse over the past few days. She denies specific chest pain, headache, confusion, disorientation, but states that the weakness is become substantial, incapacitating. Patient has multiple medical rubs, including diabetes, chronic kidney disease. Patient also has hypocalcemia for which she is taking multiple supplements. No clear relieving or exacerbating factors.  Patient has been speaking with her nephrologist about dialysis, but does not have a shunt or fistula.  Past Medical History  Diagnosis Date  . CONGESTIVE HEART FAILURE 12/14/2006  . CORONARY ARTERY DISEASE 12/14/2006  . DIABETES MELLITUS, TYPE II 12/14/2006  . HYPERLIPIDEMIA 12/14/2006  . HYPERTENSION 12/14/2006  . Hypoparathyroidism 12/21/2006  . Hypopotassemia 01/13/2007  . HYPOTHYROIDISM, POSTSURGICAL 12/15/2006  . NEOPLASM, MALIGNANT, THYROID GLAND 12/15/2006    Stage 1 papillary adenocarcinoma, one very small focus each lobe (7mm, 49mm)  6/09: CT neck: no evidence of recurrence  . OBSTRUCTIVE SLEEP APNEA 02/23/2007  . Chronic kidney disease    Past Surgical History  Procedure Laterality Date  . Thyroidectomy  2008  . Cholecystectomy     Family History  Problem Relation Age of Onset  . Cancer Other   . COPD Other   . Hypertension Other   . Hyperlipidemia Other   . Stroke Other   . Heart disease Other   . Diabetes Other   . Heart failure Mother    History  Substance Use Topics  . Smoking status: Former Research scientist (life sciences)  . Smokeless tobacco: Not on file  . Alcohol Use: No   OB History    No data available     Review of Systems  Constitutional:       Per HPI, otherwise negative  HENT:       Per HPI, otherwise negative  Respiratory:       Per HPI,  otherwise negative  Cardiovascular:       Per HPI, otherwise negative  Gastrointestinal: Negative for vomiting.  Endocrine:       Negative aside from HPI  Genitourinary:       Neg aside from HPI   Musculoskeletal:       Per HPI, otherwise negative  Skin: Negative.   Neurological: Positive for weakness. Negative for tremors, seizures, syncope and facial asymmetry.      Allergies  Review of patient's allergies indicates no known allergies.  Home Medications   Prior to Admission medications   Medication Sig Start Date End Date Taking? Authorizing Provider  amLODipine (NORVASC) 10 MG tablet Take 10 mg by mouth daily.   Yes Historical Provider, MD  aspirin (BAYER LOW STRENGTH) 81 MG EC tablet Take 81 mg by mouth daily.     Yes Historical Provider, MD  atorvastatin (LIPITOR) 40 MG tablet Take 1 tablet (40 mg total) by mouth daily. 12/08/13  Yes Jettie Booze, MD  calcitRIOL (ROCALTROL) 0.25 MCG capsule Take 0.25 mcg by mouth daily.   Yes Historical Provider, MD  calcium carbonate (OS-CAL - DOSED IN MG OF ELEMENTAL CALCIUM) 1250 MG tablet Take 3 tablets by mouth 3 (three) times daily with meals.   Yes Historical Provider, MD  cloNIDine (CATAPRES) 0.2 MG tablet Take 0.2 mg by mouth 3 (three) times daily.    Yes Historical Provider,  MD  doxazosin (CARDURA) 2 MG tablet Take 2 mg by mouth daily.   Yes Historical Provider, MD  fish oil-omega-3 fatty acids 1000 MG capsule Take 1 capsule by mouth daily.     Yes Historical Provider, MD  furosemide (LASIX) 40 MG tablet Take 40 mg by mouth daily.   Yes Historical Provider, MD  glucose blood (ONETOUCH VERIO) test strip 1 each by Other route 2 (two) times daily. And lancets 2/day 250.01 11/26/12  Yes Renato Shin, MD  insulin NPH-regular Human (NOVOLIN 70/30) (70-30) 100 UNIT/ML injection Inject 50 Units into the skin daily with breakfast. 07/26/13  Yes Renato Shin, MD  Insulin Syringe-Needle U-100 (B-D INSULIN SYRINGE) 31G X 5/16" 0.3 ML MISC Use  1 time per day. 07/20/13  Yes Renato Shin, MD  levothyroxine (SYNTHROID, LEVOTHROID) 100 MCG tablet Take 1 tablet (100 mcg total) by mouth daily before breakfast. 01/02/14  Yes Renato Shin, MD  Multiple Vitamins-Minerals (MULTIVITAMIN & MINERAL PO) Take 1 tablet by mouth daily.   Yes Historical Provider, MD  triazolam (HALCION) 0.25 MG tablet 2 tablets by mouth at bedtime    Yes Historical Provider, MD  nitroGLYCERIN (NITROSTAT) 0.4 MG SL tablet Place 1 tablet (0.4 mg total) under the tongue every 5 (five) minutes as needed. 12/08/13   Jettie Booze, MD   BP 159/61 mmHg  Pulse 63  Temp(Src) 98 F (36.7 C) (Oral)  Resp 18  SpO2 97% Physical Exam  Constitutional: She is oriented to person, place, and time. She appears ill. No distress.  HENT:  Head: Normocephalic and atraumatic.  Eyes: Conjunctivae and EOM are normal.  Cardiovascular: Normal rate and regular rhythm.   Pulmonary/Chest: Effort normal and breath sounds normal. No stridor. No respiratory distress.  Abdominal: She exhibits no distension. There is no tenderness. There is no rebound.  Musculoskeletal: She exhibits no edema.  Neurological: She is alert and oriented to person, place, and time. No cranial nerve deficit.  No tremor, shaking, clonus  Skin: Skin is warm and dry.  Psychiatric: She has a normal mood and affect.  Nursing note and vitals reviewed.   ED Course  Procedures (including critical care time) Labs Review Labs Reviewed  CBC WITH DIFFERENTIAL - Abnormal; Notable for the following:    RBC 3.71 (*)    Hemoglobin 10.8 (*)    HCT 32.0 (*)    All other components within normal limits  BASIC METABOLIC PANEL - Abnormal; Notable for the following:    Chloride 93 (*)    Glucose, Bld 206 (*)    BUN 81 (*)    Creatinine, Ser 5.81 (*)    Calcium >15.0 (*)    GFR calc non Af Amer 7 (*)    GFR calc Af Amer 8 (*)    Anion gap 19 (*)    All other components within normal limits  URINALYSIS, ROUTINE W REFLEX  MICROSCOPIC - Abnormal; Notable for the following:    Glucose, UA 250 (*)    Hgb urine dipstick MODERATE (*)    Protein, ur 100 (*)    All other components within normal limits  HEPATIC FUNCTION PANEL - Abnormal; Notable for the following:    Total Protein 8.9 (*)    All other components within normal limits  URINE MICROSCOPIC-ADD ON - Abnormal; Notable for the following:    Squamous Epithelial / LPF FEW (*)    All other components within normal limits  PHOSPHORUS  BASIC METABOLIC PANEL  PTH, INTACT AND CALCIUM  Imaging Review Dg Chest 2 View  01/19/2014   CLINICAL DATA:  Weakness 2 weeks.  EXAM: CHEST  2 VIEW  COMPARISON:  09/15/2006  FINDINGS: Lungs are adequately inflated and otherwise clear. Cardiomediastinal silhouette and remainder of the exam is unremarkable.  IMPRESSION: No active cardiopulmonary disease.   Electronically Signed   By: Marin Olp M.D.   On: 01/19/2014 19:30     EKG Interpretation   Date/Time:  Thursday January 19 2014 20:34:43 EST Ventricular Rate:  70 PR Interval:  222 QRS Duration: 103 QT Interval:  408 QTC Calculation: 440 R Axis:   -10 Text Interpretation:  Sinus rhythm Prolonged PR interval Borderline  repolarization abnormality Sinus rhythm Artifact T wave abnormality  Abnormal ekg Confirmed by Carmin Muskrat  MD [phone]) on 01/19/2014  11:08:40 PM     Patient's initial labs notable for hypercalcemia, worsening renal failure.   Following return of labs, patient received IV fluid resuscitation. Cardiac monitor patient rate 70s, sinus, unremarkable Pulse ox 99% with room air normal  On repeat exam the patient is in no distress, no new complaints. Patient is receiving second liter fluid resuscitation.   With hypercalcemia, greater than 15, worsening renal failure, elevated BUNs discussed patient's case with nephrology.  Patient will continue to receive IV fluid resuscitation, and when euvolemic can receive Lasix.  On repeat exam  the patient is awake and alert, though appears weak.  MDM   Final diagnoses:  Hypercalcemia    This patient with chronic kidney disease, diabetes now presents with generalized weakness is found to have critically high calcium. Patient had ongoing discussions about dialysis, and was taking therapy for hypocalcemia, which is likely contributory to today's presentation. Given the critical abnormality patient received multiple liters of fluid resuscitation. On several checks the patient remained hemodynamically stable, though weak appearing. After discussion with nephrology the patient was admitted to the hospitalist team for further evaluation and management.  CRITICAL CARE Performed by: Carmin Muskrat Total critical care time: 35 Critical care time was exclusive of separately billable procedures and treating other patients. Critical care was necessary to treat or prevent imminent or life-threatening deterioration. Critical care was time spent personally by me on the following activities: development of treatment plan with patient and/or surrogate as well as nursing, discussions with consultants, evaluation of patient's response to treatment, examination of patient, obtaining history from patient or surrogate, ordering and performing treatments and interventions, ordering and review of laboratory studies, ordering and review of radiographic studies, pulse oximetry and re-evaluation of patient's condition.    Carmin Muskrat, MD 01/19/14 2312

## 2014-01-19 NOTE — ED Notes (Signed)
EKG given to EDP,Lockwood,MD., for review. Pt. On monitor.

## 2014-01-19 NOTE — ED Notes (Addendum)
Per pt, has been having generalized weakness for approx 1 week.   No change in urination or bowel.  No fever.  Pt drove self to Emergency.  Lives alone.    Pt recently had med change and has added 3 different calcium tablets in last week.

## 2014-01-20 DIAGNOSIS — E1029 Type 1 diabetes mellitus with other diabetic kidney complication: Secondary | ICD-10-CM

## 2014-01-20 LAB — BASIC METABOLIC PANEL
ANION GAP: 16 — AB (ref 5–15)
BUN: 78 mg/dL — ABNORMAL HIGH (ref 6–23)
CALCIUM: 14.2 mg/dL — AB (ref 8.4–10.5)
CO2: 27 mEq/L (ref 19–32)
CREATININE: 5.68 mg/dL — AB (ref 0.50–1.10)
Chloride: 95 mEq/L — ABNORMAL LOW (ref 96–112)
GFR calc non Af Amer: 7 mL/min — ABNORMAL LOW (ref >90)
GFR, EST AFRICAN AMERICAN: 8 mL/min — AB (ref >90)
Glucose, Bld: 161 mg/dL — ABNORMAL HIGH (ref 70–99)
Potassium: 3.4 mEq/L — ABNORMAL LOW (ref 3.7–5.3)
Sodium: 138 mEq/L (ref 137–147)

## 2014-01-20 LAB — FERRITIN: Ferritin: 222 ng/mL (ref 10–291)

## 2014-01-20 LAB — CBC
HEMATOCRIT: 28.4 % — AB (ref 36.0–46.0)
Hemoglobin: 9.4 g/dL — ABNORMAL LOW (ref 12.0–15.0)
MCH: 28.9 pg (ref 26.0–34.0)
MCHC: 33.1 g/dL (ref 30.0–36.0)
MCV: 87.4 fL (ref 78.0–100.0)
Platelets: 278 10*3/uL (ref 150–400)
RBC: 3.25 MIL/uL — ABNORMAL LOW (ref 3.87–5.11)
RDW: 13 % (ref 11.5–15.5)
WBC: 12.1 10*3/uL — AB (ref 4.0–10.5)

## 2014-01-20 LAB — IRON AND TIBC
Iron: 52 ug/dL (ref 42–135)
Saturation Ratios: 17 % — ABNORMAL LOW (ref 20–55)
TIBC: 312 ug/dL (ref 250–470)
UIBC: 260 ug/dL (ref 125–400)

## 2014-01-20 LAB — GLUCOSE, CAPILLARY
GLUCOSE-CAPILLARY: 190 mg/dL — AB (ref 70–99)
GLUCOSE-CAPILLARY: 203 mg/dL — AB (ref 70–99)
Glucose-Capillary: 168 mg/dL — ABNORMAL HIGH (ref 70–99)
Glucose-Capillary: 123 mg/dL — ABNORMAL HIGH (ref 70–99)

## 2014-01-20 LAB — PHOSPHORUS: PHOSPHORUS: 5 mg/dL — AB (ref 2.3–4.6)

## 2014-01-20 LAB — HEMOGLOBIN A1C
HEMOGLOBIN A1C: 7.9 % — AB (ref <5.7)
Mean Plasma Glucose: 180 mg/dL — ABNORMAL HIGH (ref <117)

## 2014-01-20 LAB — PTH, INTACT AND CALCIUM
Calcium, Total (PTH): 14.5 mg/dL (ref 8.4–10.5)
PTH: <1 pg/mL — ABNORMAL LOW (ref 14–64)

## 2014-01-20 MED ORDER — FUROSEMIDE 80 MG PO TABS
80.0000 mg | ORAL_TABLET | Freq: Two times a day (BID) | ORAL | Status: DC
  Administered 2014-01-20 – 2014-01-22 (×4): 80 mg via ORAL
  Filled 2014-01-20 (×6): qty 1

## 2014-01-20 MED ORDER — SENNA 8.6 MG PO TABS
1.0000 | ORAL_TABLET | Freq: Every day | ORAL | Status: DC
  Administered 2014-01-20 – 2014-01-22 (×3): 8.6 mg via ORAL
  Filled 2014-01-20 (×3): qty 1

## 2014-01-20 MED ORDER — FUROSEMIDE 40 MG PO TABS
40.0000 mg | ORAL_TABLET | Freq: Once | ORAL | Status: AC
  Administered 2014-01-20: 40 mg via ORAL
  Filled 2014-01-20: qty 1

## 2014-01-20 MED ORDER — INSULIN ASPART 100 UNIT/ML ~~LOC~~ SOLN
0.0000 [IU] | Freq: Every day | SUBCUTANEOUS | Status: DC
  Administered 2014-01-20: 2 [IU] via SUBCUTANEOUS

## 2014-01-20 MED ORDER — HYDRALAZINE HCL 20 MG/ML IJ SOLN
10.0000 mg | INTRAMUSCULAR | Status: DC | PRN
  Administered 2014-01-20: 10 mg via INTRAVENOUS
  Filled 2014-01-20: qty 1

## 2014-01-20 MED ORDER — SODIUM CHLORIDE 0.9 % IV SOLN: INTRAVENOUS | Status: DC

## 2014-01-20 MED ORDER — POTASSIUM CHLORIDE CRYS ER 20 MEQ PO TBCR
40.0000 meq | EXTENDED_RELEASE_TABLET | Freq: Once | ORAL | Status: AC
  Administered 2014-01-20: 40 meq via ORAL
  Filled 2014-01-20: qty 2

## 2014-01-20 MED ORDER — INSULIN ASPART 100 UNIT/ML ~~LOC~~ SOLN
0.0000 [IU] | Freq: Three times a day (TID) | SUBCUTANEOUS | Status: DC
  Administered 2014-01-20 – 2014-01-21 (×2): 3 [IU] via SUBCUTANEOUS
  Administered 2014-01-21: 4 [IU] via SUBCUTANEOUS
  Administered 2014-01-21: 3 [IU] via SUBCUTANEOUS
  Administered 2014-01-22: 4 [IU] via SUBCUTANEOUS
  Administered 2014-01-22: 3 [IU] via SUBCUTANEOUS

## 2014-01-20 NOTE — Progress Notes (Signed)
CRITICAL VALUE ALERT  Critical value received:  Calcium 14.2  Date of notification:  01/20/14  Time of notification:  0601  Critical value read back:Yes.    Nurse who received alert:  Virgina Norfolk   MD notified (1st page): Alcario Drought   Time of first page:  0605  MD notified (2nd page):  Time of second page:  Responding MD:  Alcario Drought  Time MD responded:  [phone]

## 2014-01-20 NOTE — Plan of Care (Signed)
Problem: Phase I Progression Outcomes Goal: OOB as tolerated unless otherwise ordered Outcome: Progressing Goal: Voiding-avoid urinary catheter unless indicated Outcome: Not Applicable Date Met:  77/93/90

## 2014-01-20 NOTE — Consult Note (Signed)
Sandy Roy is an 63 y.o. female referred by Dr Charlies Silvers   Chief Complaint: CKD, hypercalcemia HPI: 63yo BF with CKD 5 admitted yest for feeling weak and found to have SCa > 15.  Pt has had issues with hypoparathyroidism and hypocalcemia.  On 01/05/14 her calcitriol was increased to .74mcg q d and Oscal started 600mg  TID.  She says she is scheduled to see [facility] to discuss acess on 01/27/14  Past Medical History  Diagnosis Date  . CONGESTIVE HEART FAILURE 12/14/2006  . CORONARY ARTERY DISEASE 12/14/2006  . DIABETES MELLITUS, TYPE II 12/14/2006  . HYPERLIPIDEMIA 12/14/2006  . HYPERTENSION 12/14/2006  . Hypoparathyroidism 12/21/2006  . Hypopotassemia 01/13/2007  . HYPOTHYROIDISM, POSTSURGICAL 12/15/2006  . NEOPLASM, MALIGNANT, THYROID GLAND 12/15/2006    Stage 1 papillary adenocarcinoma, one very small focus each lobe (37mm, 26mm)  6/09: CT neck: no evidence of recurrence  . OBSTRUCTIVE SLEEP APNEA 02/23/2007  . Chronic kidney disease     Past Surgical History  Procedure Laterality Date  . Thyroidectomy  2008  . Cholecystectomy      Family History  Problem Relation Age of Onset  . Cancer Other   . COPD Other   . Hypertension Other   . Hyperlipidemia Other   . Stroke Other   . Heart disease Other   . Diabetes Other   . Heart failure Mother    FH + CKD in sister and ESRD in mother  Social History:  reports that she has quit smoking. She does not have any smokeless tobacco history on file. She reports that she does not drink alcohol or use illicit drugs.  Lives by herself  Allergies: No Known Allergies  Medications Prior to Admission  Medication Sig Dispense Refill  . amLODipine (NORVASC) 10 MG tablet Take 10 mg by mouth daily.    Marland Kitchen aspirin (BAYER LOW STRENGTH) 81 MG EC tablet Take 81 mg by mouth daily.      Marland Kitchen atorvastatin (LIPITOR) 40 MG tablet Take 1 tablet (40 mg total) by mouth daily. 30 tablet 6  . calcitRIOL (ROCALTROL) 0.25 MCG capsule Take 0.25 mcg by mouth daily.    .  calcium carbonate (OS-CAL - DOSED IN MG OF ELEMENTAL CALCIUM) 1250 MG tablet Take 3 tablets by mouth 3 (three) times daily with meals.    . cloNIDine (CATAPRES) 0.2 MG tablet Take 0.2 mg by mouth 3 (three) times daily.     Marland Kitchen doxazosin (CARDURA) 2 MG tablet Take 2 mg by mouth daily.    . fish oil-omega-3 fatty acids 1000 MG capsule Take 1 capsule by mouth daily.      . furosemide (LASIX) 40 MG tablet Take 40 mg by mouth daily.    Marland Kitchen glucose blood (ONETOUCH VERIO) test strip 1 each by Other route 2 (two) times daily. And lancets 2/day 250.01 60 each 12  . insulin NPH-regular Human (NOVOLIN 70/30) (70-30) 100 UNIT/ML injection Inject 50 Units into the skin daily with breakfast. 20 mL 11  . Insulin Syringe-Needle U-100 (B-D INSULIN SYRINGE) 31G X 5/16" 0.3 ML MISC Use 1 time per day. 100 each 1  . levothyroxine (SYNTHROID, LEVOTHROID) 100 MCG tablet Take 1 tablet (100 mcg total) by mouth daily before breakfast. 30 tablet 2  . Multiple Vitamins-Minerals (MULTIVITAMIN & MINERAL PO) Take 1 tablet by mouth daily.    . triazolam (HALCION) 0.25 MG tablet 2 tablets by mouth at bedtime     . nitroGLYCERIN (NITROSTAT) 0.4 MG SL tablet Place 1 tablet (0.4  mg total) under the tongue every 5 (five) minutes as needed. 25 tablet 5     Lab Results: UA: 100mg  protein 3-6 rbc's   Recent Labs  01/19/14 1905 01/20/14 0415  WBC 10.5 12.1*  HGB 10.8* 9.4*  HCT 32.0* 28.4*  PLT 328 278   BMET  Recent Labs  01/19/14 1905 01/19/14 2354 01/20/14 0415  NA 141  --  138  K 3.7  --  3.4*  CL 93*  --  95*  CO2 29  --  27  GLUCOSE 206*  --  161*  BUN 81*  --  78*  CREATININE 5.81*  --  5.68*  CALCIUM >15.0*  --  14.2*  PHOS  --  5.0*  --    LFT  Recent Labs  01/19/14 1905  PROT 8.9*  ALBUMIN 4.0  AST 21  ALT 20  ALKPHOS 86  BILITOT 0.5  BILIDIR <0.2  IBILI NOT CALCULATED   Dg Chest 2 View  01/19/2014   CLINICAL DATA:  Weakness 2 weeks.  EXAM: CHEST  2 VIEW  COMPARISON:  09/15/2006  FINDINGS:  Lungs are adequately inflated and otherwise clear. Cardiomediastinal silhouette and remainder of the exam is unremarkable.  IMPRESSION: No active cardiopulmonary disease.   Electronically Signed   By: Marin Olp M.D.   On: 01/19/2014 19:30    ROS: No change in vision Appetite decreased, No N/V No SOB No CP No abd CO except for constipation No neuropathic Sxs No new arthritic CO though recently had gout flare in feet  PHYSICAL EXAM: Blood pressure 174/82, pulse 65, temperature 98.3 F (36.8 C), temperature source Oral, resp. rate 20, height 5\' 3"  (1.6 m), weight 89.54 kg (197 lb 6.4 oz), SpO2 99 %. HEENT: PERRLA EOMI NECK:no JVD LUNGS:clear CARDIAC: RRR wo MRG ABD:+ BS NTND no HSM EXT:No edema NEURO:CNI M&SI  OX# no asterixis  Assessment: 1. Hypercalcemia most likely iatrogenic and due to Vit D and Ca supp 2. CKD 5, stable 3. Anemia 4.  DM 5. HTN PLAN: 1. Cont IV fluids 2. PO lasix 80mg  BID to help with Ca excretion 3. Hold Vit D and oscal 4. Check SPEP just to make sure 5. Daily SCa 6. Renal diet   Mihaela Fajardo T 01/20/2014, 10:47 AM

## 2014-01-20 NOTE — Progress Notes (Addendum)
Patient ID: Sandy Roy, female   DOB: 30-Jun-1950, 63 y.o.   MRN: [id number] [facility] PROGRESS NOTE  Aneri Debruhl Stead [id number] DOB: 1950-10-13 DOA: 01/19/2014 PCP: Gara Kroner, MD  Brief narrative: 63 y.o. female longstanding history of poorly controlled DM resulting from CKD stage 4-5 over past couple of months who presented to [facility] ED 01/19/2014  with generalized weakness. On admission, blood pressure was as high as 221/80, heart rate 81, respiratory rate 20, oxygen saturation 97% on room air. Blood work revealed hemoglobin of 10.8, normal potassium, creatinine of 5.81 and calcium level more than 15.chest x-ray showed no acute cardiopulmonary disease. Nephrology has seen the patient in consultation.  Assessment/Plan:    Principal Problem: Hypercalcemia  - appreciate nephrology consult and their recommendations. Current recommendation is to continue IV fluids and Lasix was increased to 80 mg twice daily to help with calcium excretion. - Vitamin D and Os-Cal on hold. - follow-up SPEP results - Continue to monitor daily calcium levels. Active Problems: Hypothyroidism  - continue Synthroid 100 g daily Essential hypertension - Continue Norvasc 10 mg daily, clonidine 0.2 mg by mouth 3 times a day, doxazosin 2 mg daily, Lasix Dyslipidemia - Continue Lipitor 40 mg at bedtime and omega-3 daily CKD stage IV/ V - Creatinine is 5.81on this admission with slight downward trend to 5.68. - Appreciate renal following Diabetes mellitus, uncontrolled with renal manifestations - Continue and NovoLog 70-30 mix 50 units with breakfast - Add sliding scale insulin resistant scale Morbid obesity - Nutrition consulted  DVT Prophylaxis   Heparin subcutaneous ordered as results  Code Status: Full.  Family Communication:  plan of care discussed with the patient Disposition Plan: Home when stable.   IV access:   Peripheral IV  Procedures and diagnostic studies:    Dg Chest 2  View 01/19/2014  No active cardiopulmonary disease.   Electronically Signed   By: Marin Olp M.D.   On: 01/19/2014 19:30   Medical Consultants:   Nephrology   Other Consultants:   Nutrition   IAnti-Infectives:    None    Leisa Lenz, MD  [facility] Pager [phone]  If 7PM-7AM, please contact night-coverage [email or web address] Password [email or web address] 01/20/2014, 8:42 AM   LOS: 1 day    HPI/Subjective: No acute overnight events.  Objective: Filed Vitals:   01/20/14 0031 01/20/14 0116 01/20/14 0300 01/20/14 0450  BP: 221/80 183/80 142/64 154/68  Pulse: 81   65  Temp: 99.1 F (37.3 C)   98.3 F (36.8 C)  TempSrc:    Oral  Resp: 18   20  Height: 5\' 3"  (1.6 m)     Weight: 89.54 kg (197 lb 6.4 oz)     SpO2: 100%   99%    Intake/Output Summary (Last 24 hours) at 01/20/14 [id number] Last data filed at 01/20/14 0802  Gross per 24 hour  Intake      0 ml  Output    200 ml  Net   -200 ml    Exam:   General:  Pt is alert, follows commands appropriately, not in acute distress  Cardiovascular: Regular rate and rhythm, S1/S2 appreciated, SEM +2/6 appreciated   Respiratory: Clear to auscultation bilaterally, no wheezing, no crackles, no rhonchi  Abdomen: Soft, non tender, non distended, bowel sounds present  Extremities: No edema, pulses DP and PT palpable bilaterally  Neuro: Grossly nonfocal  Data Reviewed: Basic Metabolic Panel:  Recent Labs Lab 01/19/14 1905 01/19/14 2354 01/20/14 0415  NA 141  --  138  K 3.7  --  3.4*  CL 93*  --  95*  CO2 29  --  27  GLUCOSE 206*  --  161*  BUN 81*  --  78*  CREATININE 5.81*  --  5.68*  CALCIUM >15.0*  --  14.2*  PHOS  --  5.0*  --    Liver Function Tests:  Recent Labs Lab 01/19/14 1905  AST 21  ALT 20  ALKPHOS 86  BILITOT 0.5  PROT 8.9*  ALBUMIN 4.0   No results for input(s): LIPASE, AMYLASE in the last 168 hours. No results for input(s): AMMONIA in the last 168 hours. CBC:  Recent Labs Lab  01/19/14 1905 01/20/14 0415  WBC 10.5 12.1*  NEUTROABS 7.0  --   HGB 10.8* 9.4*  HCT 32.0* 28.4*  MCV 86.3 87.4  PLT 328 278   Cardiac Enzymes: No results for input(s): CKTOTAL, CKMB, CKMBINDEX, TROPONINI in the last 168 hours. BNP: Invalid input(s): POCBNP CBG:  Recent Labs Lab 01/20/14 0737  GLUCAP 168*    No results found for this or any previous visit (from the past 240 hour(s)).   Scheduled Meds: . amLODipine  10 mg Oral Daily  . aspirin EC  81 mg Oral Daily  . atorvastatin  40 mg Oral q1800  . cloNIDine  0.2 mg Oral TID  . doxazosin  2 mg Oral Daily  . furosemide 80 mg Oral BID  . heparin  5,000 Units Subcutaneous 3 times per day  . insulin aspart protamine- aspart  50 Units Subcutaneous Q breakfast  . levothyroxine  100 mcg Oral QAC breakfast  . omega-3 acid ethyl   1 g Oral Daily  . triazolam  0.5 mg Oral QHS   Continuous Infusions: . sodium chloride 100 mL/hr at 01/20/14 [id number]

## 2014-01-20 NOTE — Progress Notes (Signed)
CRITICAL VALUE ALERT  Critical value received:  Calcium PTH 14.5  Date of notification:  01/20/14  Time of notification:  [id number]  Critical value read back:Yes.    Nurse who received alert:  Meredith Mody, RN  MD notified (1st page):  Dr.Devine  Time of first page:  59  MD notified (2nd page):  Time of second page:  Responding MD:  Dr. Charlies Silvers  Time MD responded:  1430

## 2014-01-20 NOTE — Progress Notes (Signed)
  RD consulted for nutrition education regarding obesity  Body mass index is 34.98 kg/(m^2). Pt meets criteria for Obesity based on current BMI.  RD provided "Chronic Kidney Disease Stage 5 Nutrition Therapy" handout as well as "1500-Calorie 5-day Sample Menus" from the Academy of Nutrition and Dietetics. Emphasized the importance of serving sizes and provided examples of correct portions of common foods. Encouraged intake of fruits and vegetables with each meal. Discussed importance of controlled and consistent intake throughout the day. Emphasized the importance of hydration with calorie-free beverages and limiting sugar-sweetened beverages. Encouraged patient to avoid high sodium and high phosphorus foods as well as limit protein. Reviewed patient's dietary recall and provided tips for making meals more renal friendly and decreasing calories Teach back method used.  Expect good compliance. Patient states that she started eating less red meat and biscuits 3 weeks ago and has lost 4 lbs.   Wt Readings from Last 15 Encounters:  01/20/14 197 lb 6.4 oz (89.54 kg)  12/08/13 198 lb (89.812 kg)  12/01/13 202 lb (91.627 kg)  09/30/13 196 lb (88.905 kg)  07/27/13 195 lb 12.8 oz (88.814 kg)  07/06/13 189 lb (85.73 kg)  04/07/13 185 lb (83.915 kg)  01/06/13 173 lb 11.2 oz (78.79 kg)  12/16/12 173 lb (78.472 kg)  11/26/12 175 lb (79.379 kg)  08/31/12 178 lb (80.74 kg)  04/01/12 184 lb (83.462 kg)  10/02/11 182 lb (82.555 kg)  03/17/11 180 lb 12.8 oz (82.01 kg)  04/25/10 185 lb (83.915 kg)   Current diet order is Renal/Carb Modified, patient is consuming approximately 100% of meals at this time. Pt reports skipping lunch due to lack of appetite. Labs and medications reviewed. No further nutrition interventions warranted at this time. RD contact information provided. If additional nutrition issues arise, please re-consult RD.  Pryor Ochoa RD, LDN Inpatient Clinical Dietitian Pager:  [phone] After Hours Pager: [phone]

## 2014-01-20 NOTE — Progress Notes (Signed)
Inpatient Diabetes Program Recommendations  AACE/ADA: New Consensus Statement on Inpatient Glycemic Control (2013)  Target Ranges:  Prepandial:   less than 140 mg/dL      Peak postprandial:   less than 180 mg/dL (1-2 hours)      Critically ill patients:  140 - 180 mg/dL    Inpatient Diabetes Program Recommendations Correction (SSI): consider adding Novolog sensitive scale TID Thank you  Raoul Pitch BSN, RN,CDE Inpatient Diabetes Coordinator [phone] (team pager)

## 2014-01-21 LAB — CBC
HEMATOCRIT: 28.4 % — AB (ref 36.0–46.0)
Hemoglobin: 9.3 g/dL — ABNORMAL LOW (ref 12.0–15.0)
MCH: 28.9 pg (ref 26.0–34.0)
MCHC: 32.7 g/dL (ref 30.0–36.0)
MCV: 88.2 fL (ref 78.0–100.0)
Platelets: 261 10*3/uL (ref 150–400)
RBC: 3.22 MIL/uL — ABNORMAL LOW (ref 3.87–5.11)
RDW: 13.1 % (ref 11.5–15.5)
WBC: 8.8 10*3/uL (ref 4.0–10.5)

## 2014-01-21 LAB — RENAL FUNCTION PANEL
ALBUMIN: 3.4 g/dL — AB (ref 3.5–5.2)
Anion gap: 15 (ref 5–15)
BUN: 73 mg/dL — AB (ref 6–23)
CALCIUM: 12.1 mg/dL — AB (ref 8.4–10.5)
CO2: 27 mEq/L (ref 19–32)
Chloride: 97 mEq/L (ref 96–112)
Creatinine, Ser: 5.69 mg/dL — ABNORMAL HIGH (ref 0.50–1.10)
GFR calc Af Amer: 8 mL/min — ABNORMAL LOW (ref >90)
GFR calc non Af Amer: 7 mL/min — ABNORMAL LOW (ref >90)
Glucose, Bld: 146 mg/dL — ABNORMAL HIGH (ref 70–99)
PHOSPHORUS: 3.7 mg/dL (ref 2.3–4.6)
POTASSIUM: 3.9 meq/L (ref 3.7–5.3)
Sodium: 139 mEq/L (ref 137–147)

## 2014-01-21 LAB — GLUCOSE, CAPILLARY
Glucose-Capillary: 154 mg/dL — ABNORMAL HIGH (ref 70–99)
Glucose-Capillary: 122 mg/dL — ABNORMAL HIGH (ref 70–99)
Glucose-Capillary: 140 mg/dL — ABNORMAL HIGH (ref 70–99)
Glucose-Capillary: 193 mg/dL — ABNORMAL HIGH (ref 70–99)

## 2014-01-21 MED ORDER — NAPHAZOLINE HCL 0.1 % OP SOLN
1.0000 [drp] | Freq: Four times a day (QID) | OPHTHALMIC | Status: DC | PRN
  Administered 2014-01-21: 1 [drp] via OPHTHALMIC
  Filled 2014-01-21: qty 15

## 2014-01-21 NOTE — Progress Notes (Addendum)
S: feels better, no new CO O:BP 155/79 mmHg  Pulse 60  Temp(Src) 98.5 F (36.9 C) (Oral)  Resp 20  Ht 5\' 3"  (1.6 m)  Wt 89.54 kg (197 lb 6.4 oz)  BMI 34.98 kg/m2  SpO2 100%  Intake/Output Summary (Last 24 hours) at 01/21/14 0942 Last data filed at 01/21/14 0900  Gross per 24 hour  Intake   2960 ml  Output   1500 ml  Net   1460 ml   Weight change:  [id number] and alert CVS:RRR Resp:clear Abd:+ BS NTND Ext:No edema NEURO:CNI Ox3 no asterixis   . amLODipine  10 mg Oral Daily  . aspirin EC  81 mg Oral Daily  . atorvastatin  40 mg Oral q1800  . cloNIDine  0.2 mg Oral TID  . doxazosin  2 mg Oral Daily  . furosemide  80 mg Oral BID  . heparin  5,000 Units Subcutaneous 3 times per day  . insulin aspart  0-20 Units Subcutaneous TID WC  . insulin aspart  0-5 Units Subcutaneous QHS  . insulin aspart protamine- aspart  50 Units Subcutaneous Q breakfast  . levothyroxine  100 mcg Oral QAC breakfast  . omega-3 acid ethyl esters  1 g Oral Daily  . senna  1 tablet Oral Daily  . triazolam  0.5 mg Oral QHS   Dg Chest 2 View  01/19/2014   CLINICAL DATA:  Weakness 2 weeks.  EXAM: CHEST  2 VIEW  COMPARISON:  09/15/2006  FINDINGS: Lungs are adequately inflated and otherwise clear. Cardiomediastinal silhouette and remainder of the exam is unremarkable.  IMPRESSION: No active cardiopulmonary disease.   Electronically Signed   By: Marin Olp M.D.   On: 01/19/2014 19:30   BMET    Component Value Date/Time   NA 139 01/21/2014 0610   K 3.9 01/21/2014 0610   CL 97 01/21/2014 0610   CO2 27 01/21/2014 0610   GLUCOSE 146* 01/21/2014 0610   BUN 73* 01/21/2014 0610   CREATININE 5.69* 01/21/2014 0610   CALCIUM 12.1* 01/21/2014 0610   CALCIUM 14.5* 01/19/2014 2354   GFRNONAA 7* 01/21/2014 0610   GFRAA 8* 01/21/2014 0610   CBC    Component Value Date/Time   WBC 8.8 01/21/2014 0610   RBC 3.22* 01/21/2014 0610   HGB 9.3* 01/21/2014 0610   HCT 28.4* 01/21/2014 0610   PLT 261 01/21/2014  0610   MCV 88.2 01/21/2014 0610   MCH 28.9 01/21/2014 0610   MCHC 32.7 01/21/2014 0610   RDW 13.1 01/21/2014 0610   LYMPHSABS 2.4 01/19/2014 1905   MONOABS 1.0 01/19/2014 1905   EOSABS 0.1 01/19/2014 1905   BASOSABS 0.0 01/19/2014 1905     Assessment: 1. Hypercalcemia most likely sec to meds, improving 2. CKD 5, stable 3. Anemia 4 DM 5. HTN  Plan: 1. Cont IV fluids and PO lasix.   2. Recheck Sca in AM.  Anticipate that she could go tomorrow and she is agreeable to that.   Marquis Diles T

## 2014-01-21 NOTE — Progress Notes (Addendum)
Patient ID: Sandy Roy, female   DOB: 1950/05/23, 63 y.o.   MRN: [id number] [facility] PROGRESS NOTE  Sandy Roy [id number] DOB: Jul 21, 1950 DOA: 01/19/2014 PCP: Gara Kroner, MD  Brief narrative: 63 y.o. female longstanding history of poorly controlled DM resulting from CKD stage 4-5 over past couple of months who presented to [facility] ED 01/19/2014 with generalized weakness. On admission, blood pressure was as high as 221/80, heart rate 81, respiratory rate 20, oxygen saturation 97% on room air. Blood work revealed hemoglobin of 10.8, normal potassium, creatinine of 5.81 and calcium level more than 15.chest x-ray showed no acute cardiopulmonary disease. Nephrology has seen the patient in consultation.  Assessment/Plan:    Principal Problem: Hypercalcemia  - appreciate nephrology following. Continue IV fluids and Lasix 80 mg twice daily to help with calcium excretion. - Vitamin D and Os-Cal on hold. - SPEP results pending  - Continue to monitor daily calcium levels. Active Problems: Hypothyroidism  - continue Synthroid 100 g daily Essential hypertension - Continue Norvasc 10 mg daily, clonidine 0.2 mg by mouth 3 times a day, doxazosyn 2 mg daily, Lasix 80 mg PO BID Dyslipidemia - Continue Lipitor 40 mg at bedtime and omega-3 daily CKD stage IV/ V - Creatinine is 5.81on this admission with slight downward trend to 5.68. - renal following  Diabetes mellitus, uncontrolled with renal manifestations - Continue NovoLog 70-30 mix 50 units with breakfast - Added sliding scale insulin resistant scale - CBG's in past 24 hours: 203, 154, 122 Morbid obesity - Nutrition consulted  DVT Prophylaxis   Heparin subcutaneous ordered as results  Code Status: Full.  Family Communication: plan of care discussed with the patient Disposition Plan: Home when stable.   IV access:   Peripheral IV  Procedures and diagnostic studies:   Dg Chest 2 View 01/19/2014 No  active cardiopulmonary disease. Electronically Signed By: Marin Olp M.D. On: 01/19/2014 19:30   Medical Consultants:   Nephrology   Other Consultants:   Nutrition  IAnti-Infectives:    None   Leisa Lenz, MD  [facility] Pager [phone]  If 7PM-7AM, please contact night-coverage [email or web address] Password [email or web address] 01/21/2014, 1:32 PM   LOS: 2 days    HPI/Subjective: No acute overnight events.  Objective: Filed Vitals:   01/20/14 1346 01/20/14 1650 01/20/14 2027 01/21/14 0728  BP: 148/70 149/69 149/76 155/79  Pulse: 64  65 60  Temp: 98.5 F (36.9 C)  98.7 F (37.1 C) 98.5 F (36.9 C)  TempSrc: Oral  Oral Oral  Resp: 18  20 20   Height:      Weight:      SpO2: 97%  96% 100%    Intake/Output Summary (Last 24 hours) at 01/21/14 1332 Last data filed at 01/21/14 1154  Gross per 24 hour  Intake   2960 ml  Output   1800 ml  Net   1160 ml    Exam:   General:  Pt is alert, follows commands appropriately, not in acute distress  Cardiovascular: Regular rate and rhythm, S1/S2 appreciated   Respiratory: bilateral air entry, no wheezing   Abdomen: Soft, non tender, non distended, bowel sounds present  Extremities: pulses DP and PT palpable bilaterally  Neuro: Grossly nonfocal  Data Reviewed: Basic Metabolic Panel:  Recent Labs Lab 01/19/14 1905 01/19/14 2354 01/20/14 0415 01/21/14 0610  NA 141  --  138 139  K 3.7  --  3.4* 3.9  CL 93*  --  95* 97  CO2 29  --  27 27  GLUCOSE 206*  --  161* 146*  BUN 81*  --  78* 73*  CREATININE 5.81*  --  5.68* 5.69*  CALCIUM >15.0* 14.5* 14.2* 12.1*  PHOS  --  5.0*  --  3.7   Liver Function Tests:  Recent Labs Lab 01/19/14 1905 01/21/14 0610  AST 21  --   ALT 20  --   ALKPHOS 86  --   BILITOT 0.5  --   PROT 8.9*  --   ALBUMIN 4.0 3.4*   No results for input(s): LIPASE, AMYLASE in the last 168 hours. No results for input(s): AMMONIA in the last 168 hours. CBC:  Recent Labs Lab  01/19/14 1905 01/20/14 0415 01/21/14 0610  WBC 10.5 12.1* 8.8  NEUTROABS 7.0  --   --   HGB 10.8* 9.4* 9.3*  HCT 32.0* 28.4* 28.4*  MCV 86.3 87.4 88.2  PLT 328 278 261   Cardiac Enzymes: No results for input(s): CKTOTAL, CKMB, CKMBINDEX, TROPONINI in the last 168 hours. BNP: Invalid input(s): POCBNP CBG:  Recent Labs Lab 01/20/14 0737 01/20/14 1150 01/20/14 1647 01/20/14 2124 01/21/14 0729  GLUCAP 168* 190* 123* 203* 154*    No results found for this or any previous visit (from the past 240 hour(s)).   Scheduled Meds: . amLODipine  10 mg Oral Daily  . aspirin EC  81 mg Oral Daily  . atorvastatin  40 mg Oral q1800  . cloNIDine  0.2 mg Oral TID  . doxazosin  2 mg Oral Daily  . furosemide  80 mg Oral BID  . heparin  5,000 Units Subcutaneous 3 times per day  . insulin aspart  0-20 Units Subcutaneous TID WC  . insulin aspart  0-5 Units Subcutaneous QHS  . insulin aspart protamine- aspart  50 Units Subcutaneous Q breakfast  . levothyroxine  100 mcg Oral QAC breakfast  . omega-3 acid ethyl esters  1 g Oral Daily  . senna  1 tablet Oral Daily  . triazolam  0.5 mg Oral QHS   Continuous Infusions: . sodium chloride 100 mL/hr at 01/21/14 0450

## 2014-01-21 NOTE — Evaluation (Signed)
Physical Therapy Evaluation Patient Details Name: Sandy Roy MRN: [id number] DOB: Jul 22, 1950 Today's Date: 01/21/2014   History of Present Illness  Sandy Roy is a 63 y.o. female with  history of poorly controlled DM resulting now in CKD stage 4-5 over past couple of months, who presents 01/18/14 with generalized weakness over the past few days. also hypercalcemia  Clinical Impression  Patient was unsteady initially when ambulating  But improved with  Distance. Patient will benefit from PT to address problems listed in note below.    Follow Up Recommendations No PT follow up    Equipment Recommendations  None recommended by PT    Recommendations for Other Services       Precautions / Restrictions Precautions Precautions: Fall      Mobility  Bed Mobility Overal bed mobility: Independent                Transfers Overall transfer level: Independent Equipment used: None Transfers: Sit to/from Stand              Ambulation/Gait Ambulation/Gait assistance: Museum/gallery curator (Feet): 450 Feet Assistive device: 1 person hand held assist Gait Pattern/deviations: Staggering right;Staggering left;Step-through pattern     General Gait Details: pt was unsteady  during initial ambulation, staggering. Provided ligh arm hold, gait improved.   Stairs            Wheelchair Mobility    Modified Rankin (Stroke Patients Only)       Balance Overall balance assessment: No apparent balance deficits (not formally assessed);Needs assistance Sitting-balance support: Feet supported;No upper extremity supported Sitting balance-Leahy Scale: Good     Standing balance support: During functional activity;No upper extremity supported Standing balance-Leahy Scale: Fair Standing balance comment: able to cARRY OBJECTS FROM TABLE TO COUNTER, TURNED IN CIRCLES, NO LOSS OF BALANCE.                             Pertinent Vitals/Pain Pain  Assessment: No/denies pain    Home Living Family/patient expects to be discharged to:: Private residence Living Arrangements: Alone Available Help at Discharge: Family Type of Home: House Home Access: Stairs to enter Entrance Stairs-Rails: None Technical brewer of Steps: 3 Home Layout: One level Home Equipment: None      Prior Function Level of Independence: Independent               Hand Dominance        Extremity/Trunk Assessment   Upper Extremity Assessment: Generalized weakness           Lower Extremity Assessment: Generalized weakness      Cervical / Trunk Assessment: Normal  Communication   Communication: No difficulties  Cognition Arousal/Alertness: Awake/alert Behavior During Therapy: WFL for tasks assessed/performed Overall Cognitive Status: Within Functional Limits for tasks assessed                      General Comments      Exercises        Assessment/Plan    PT Assessment Patient needs continued PT services  PT Diagnosis Generalized weakness   PT Problem List Decreased strength;Decreased activity tolerance;Decreased mobility  PT Treatment Interventions Gait training;Functional mobility training;Therapeutic activities;Therapeutic exercise;Patient/family education   PT Goals (Current goals can be found in the Care Plan section) Acute Rehab PT Goals Patient Stated Goal: to go home. PT Goal Formulation: With patient Time For Goal Achievement: 02/04/14 Potential to Achieve Goals:  Good    Frequency Min 3X/week   Barriers to discharge        Co-evaluation               End of Session   Activity Tolerance: Patient tolerated treatment well Patient left: in bed;with call bell/phone within reach;with bed alarm set Nurse Communication: Mobility status         Time: 1000-1021 PT Time Calculation (min) (ACUTE ONLY): 21 min   Charges:   PT Evaluation $Initial PT Evaluation Tier I: 1 Procedure PT  Treatments $Gait Training: 8-22 mins   PT G Codes:          [name] 01/21/2014, 12:42 PM Tresa Endo PT [phone]

## 2014-01-22 LAB — RENAL FUNCTION PANEL
ANION GAP: 16 — AB (ref 5–15)
Albumin: 3.3 g/dL — ABNORMAL LOW (ref 3.5–5.2)
BUN: 71 mg/dL — ABNORMAL HIGH (ref 6–23)
CO2: 27 meq/L (ref 19–32)
Calcium: 10.9 mg/dL — ABNORMAL HIGH (ref 8.4–10.5)
Chloride: 98 mEq/L (ref 96–112)
Creatinine, Ser: 5.77 mg/dL — ABNORMAL HIGH (ref 0.50–1.10)
GFR calc non Af Amer: 7 mL/min — ABNORMAL LOW (ref >90)
GFR, EST AFRICAN AMERICAN: 8 mL/min — AB (ref >90)
GLUCOSE: 152 mg/dL — AB (ref 70–99)
POTASSIUM: 3.7 meq/L (ref 3.7–5.3)
Phosphorus: 3.8 mg/dL (ref 2.3–4.6)
Sodium: 141 mEq/L (ref 137–147)

## 2014-01-22 LAB — GLUCOSE, CAPILLARY
Glucose-Capillary: 169 mg/dL — ABNORMAL HIGH (ref 70–99)
Glucose-Capillary: 141 mg/dL — ABNORMAL HIGH (ref 70–99)

## 2014-01-22 MED ORDER — FUROSEMIDE 80 MG PO TABS: 80.0000 mg | ORAL_TABLET | Freq: Two times a day (BID) | ORAL | Status: DC

## 2014-01-22 NOTE — Care Management Note (Signed)
    Page 1 of 1   01/22/2014     3:38:18 PM CARE MANAGEMENT NOTE 01/22/2014  Patient:  Sandy Roy, Sandy Roy   Account Number:  [id number]  Date Initiated:  01/20/2014  Documentation initiated by:  Karl Bales  Subjective/Objective Assessment:   pt admitted with cco weakness, N,V, hypercalcemia     Action/Plan:   from home   Anticipated DC Date:  01/22/2014   Anticipated DC Plan:  HOME/SELF CARE         Choice offered to / List presented to:             Status of service:  Completed, signed off Medicare Important Message given?  YES (If response is "NO", the following Medicare IM given date fields will be blank) Date Medicare IM given:  01/22/2014 Medicare IM given by:  [facility] Date Additional Medicare IM given:   Additional Medicare IM given by:    Discharge Disposition:  HOME/SELF CARE  Per UR Regulation:  Reviewed for med. necessity/level of care/duration of stay  If discussed at [place] of Stay Meetings, dates discussed:    Comments:

## 2014-01-22 NOTE — Discharge Summary (Addendum)
Physician Discharge Summary  Sandy Roy [id number] DOB: Jul 05, 1950 DOA: 01/19/2014  PCP: Gara Kroner, MD  Admit date: 01/19/2014 Discharge date: 01/22/2014  Recommendations for Outpatient Follow-up:  1. Continue lasix 80 mg twice a day as prescribed. 2. Follow up renal per scheduled appt.  Discharge Diagnoses:  Principal Problem:   Hypercalcemia Active Problems:   HYPOTHYROIDISM, POSTSURGICAL   Hypoparathyroidism   Type 1 diabetes mellitus with renal manifestations   CKD stage 4 due to type 2 diabetes mellitus    Discharge Condition: stable; pt feels comfortable going home today   Diet recommendation: as tolerated; renal diet   History of present illness:  63 y.o. female longstanding history of poorly controlled DM resulting from CKD stage 4-5 over past couple of months who presented to [facility] ED 01/19/2014 with generalized weakness. On admission, blood pressure was as high as 221/80, heart rate 81, respiratory rate 20, oxygen saturation 97% on room air. Blood work revealed hemoglobin of 10.8, normal potassium, creatinine of 5.81 and calcium level more than 15.chest x-ray showed no acute cardiopulmonary disease. Nephrology has seen the patient in consultation.  Assessment/Plan:    Principal Problem: Hypercalcemia  - appreciate nephrology following.  - Vitamin D and Os-Cal on hold. - calcium level improved with lasix 80 mg PO BID which she will continue on discharge  Active Problems: Hypothyroidism  - continue Synthroid 100 g daily Essential hypertension - Continue Norvasc 10 mg daily, clonidine 0.2 mg by mouth 3 times a day, doxazosyn 2 mg daily, Lasix 80 mg PO BID Dyslipidemia - Continue Lipitor 40 mg at bedtime and omega-3 daily CKD stage IV/ V - Creatinine is 5.81 on this admission with overall slight downward trend since admission, 5.7. Diabetes mellitus, uncontrolled with renal manifestations - Continue insulin regimen per home dose Morbid  obesity - Nutrition consulted  DVT Prophylaxis   Heparin subcutaneous ordered as results  Code Status: Full.  Family Communication: plan of care discussed with the patient   IV access:   Peripheral IV  Procedures and diagnostic studies:   Dg Chest 2 View 01/19/2014 No active cardiopulmonary disease. Electronically Signed By: Marin Olp M.D. On: 01/19/2014 19:30   Medical Consultants:   Nephrology  Other Consultants:   Nutrition  IAnti-Infectives:    None  Signed:  Leisa Lenz, MD  [facility] 01/22/2014, 11:37 AM  Pager #: [phone]   Discharge Exam: Filed Vitals:   01/22/14 0539  BP: 137/66  Pulse: 57  Temp: 97.9 F (36.6 C)  Resp: 20   Filed Vitals:   01/21/14 0728 01/21/14 1436 01/21/14 2202 01/22/14 0539  BP: 155/79 127/60 143/71 137/66  Pulse: 60 57 56 57  Temp: 98.5 F (36.9 C) 97.8 F (36.6 C) 98.8 F (37.1 C) 97.9 F (36.6 C)  TempSrc: Oral Oral Oral Oral  Resp: 20 16 18 20   Height:      Weight:      SpO2: 100% 100% 100% 100%    General: Pt is alert, follows commands appropriately, not in acute distress Cardiovascular: Regular rate and rhythm, S1/S2 appreciated  Respiratory: no wheezing, no crackles  Abdominal: non distended, bowel sounds +, no guarding Extremities: no edema, pulses palpable  Neuro: Grossly nonfocal  Discharge Instructions  Discharge Instructions    Call MD for:  difficulty breathing, headache or visual disturbances    Complete by:  As directed      Call MD for:  persistant dizziness or light-headedness    Complete by:  As directed  Call MD for:  redness, tenderness, or signs of infection (pain, swelling, redness, odor or green/yellow discharge around incision site)    Complete by:  As directed      Call MD for:  severe uncontrolled pain    Complete by:  As directed      Diet - low sodium heart healthy    Complete by:  As directed      Discharge instructions     Complete by:  As directed   1. Continue lasix 80 mg twice a day as prescribed. 2. Follow up renal per scheduled appt.     Increase activity slowly    Complete by:  As directed             Medication List    STOP taking these medications        calcitRIOL 0.25 MCG capsule  Commonly known as:  ROCALTROL     calcium carbonate 1250 MG tablet  Commonly known as:  OS-CAL - dosed in mg of elemental calcium      TAKE these medications        amLODipine 10 MG tablet  Commonly known as:  NORVASC  Take 10 mg by mouth daily.     atorvastatin 40 MG tablet  Commonly known as:  LIPITOR  Take 1 tablet (40 mg total) by mouth daily.     BAYER LOW STRENGTH 81 MG EC tablet  Generic drug:  aspirin  Take 81 mg by mouth daily.     cloNIDine 0.2 MG tablet  Commonly known as:  CATAPRES  Take 0.2 mg by mouth 3 (three) times daily.     doxazosin 2 MG tablet  Commonly known as:  CARDURA  Take 2 mg by mouth daily.     fish oil-omega-3 fatty acids 1000 MG capsule  Take 1 capsule by mouth daily.     furosemide 80 MG tablet  Commonly known as:  LASIX  Take 1 tablet (80 mg total) by mouth 2 (two) times daily.     glucose blood test strip  Commonly known as:  ONETOUCH VERIO  1 each by Other route 2 (two) times daily. And lancets 2/day 250.01     insulin NPH-regular Human (70-30) 100 UNIT/ML injection  Commonly known as:  NOVOLIN 70/30  Inject 50 Units into the skin daily with breakfast.     Insulin Syringe-Needle U-100 31G X 5/16" 0.3 ML Misc  Commonly known as:  B-D INSULIN SYRINGE  Use 1 time per day.     levothyroxine 100 MCG tablet  Commonly known as:  SYNTHROID, LEVOTHROID  Take 1 tablet (100 mcg total) by mouth daily before breakfast.     MULTIVITAMIN & MINERAL PO  Take 1 tablet by mouth daily.     nitroGLYCERIN 0.4 MG SL tablet  Commonly known as:  NITROSTAT  Place 1 tablet (0.4 mg total) under the tongue every 5 (five) minutes as needed.     triazolam 0.25 MG tablet   Commonly known as:  HALCION  2 tablets by mouth at bedtime           Follow-up Information    Follow up with Windy Kalata, MD.   Specialty:  Nephrology   Why:  Follow up appt after recent hospitalization; please make an appt within next couple of days; needs recheck calcium level, kidney function    Contact information:   [place] [address] [phone]        The results of  significant diagnostics from this hospitalization (including imaging, microbiology, ancillary and laboratory) are listed below for reference.    Significant Diagnostic Studies: Dg Chest 2 View  01/19/2014   CLINICAL DATA:  Weakness 2 weeks.  EXAM: CHEST  2 VIEW  COMPARISON:  09/15/2006  FINDINGS: Lungs are adequately inflated and otherwise clear. Cardiomediastinal silhouette and remainder of the exam is unremarkable.  IMPRESSION: No active cardiopulmonary disease.   Electronically Signed   By: Marin Olp M.D.   On: 01/19/2014 19:30    Microbiology: No results found for this or any previous visit (from the past 240 hour(s)).   Labs: Basic Metabolic Panel:  Recent Labs Lab 01/19/14 1905 01/19/14 2354 01/20/14 0415 01/21/14 0610 01/22/14 0505  NA 141  --  138 139 141  K 3.7  --  3.4* 3.9 3.7  CL 93*  --  95* 97 98  CO2 29  --  27 27 27   GLUCOSE 206*  --  161* 146* 152*  BUN 81*  --  78* 73* 71*  CREATININE 5.81*  --  5.68* 5.69* 5.77*  CALCIUM >15.0* 14.5* 14.2* 12.1* 10.9*  PHOS  --  5.0*  --  3.7 3.8   Liver Function Tests:  Recent Labs Lab 01/19/14 1905 01/21/14 0610 01/22/14 0505  AST 21  --   --   ALT 20  --   --   ALKPHOS 86  --   --   BILITOT 0.5  --   --   PROT 8.9*  --   --   ALBUMIN 4.0 3.4* 3.3*   No results for input(s): LIPASE, AMYLASE in the last 168 hours. No results for input(s): AMMONIA in the last 168 hours. CBC:  Recent Labs Lab 01/19/14 1905 01/20/14 0415 01/21/14 0610  WBC 10.5 12.1* 8.8  NEUTROABS 7.0  --   --   HGB 10.8*  9.4* 9.3*  HCT 32.0* 28.4* 28.4*  MCV 86.3 87.4 88.2  PLT 328 278 261   Cardiac Enzymes: No results for input(s): CKTOTAL, CKMB, CKMBINDEX, TROPONINI in the last 168 hours. BNP: BNP (last 3 results) No results for input(s): PROBNP in the last 8760 hours. CBG:  Recent Labs Lab 01/21/14 0729 01/21/14 1151 01/21/14 1625 01/21/14 2159 01/22/14 0822  GLUCAP 154* 122* 140* 193* 169*    Time coordinating discharge: Over 30 minutes

## 2014-01-22 NOTE — Plan of Care (Signed)
Problem: Discharge Progression Outcomes Goal: Other Discharge Outcomes/Goals Outcome: Completed/Met Date Met:  01/22/14

## 2014-01-22 NOTE — Plan of Care (Signed)
Problem: Phase I Progression Outcomes Goal: Initial discharge plan identified Outcome: Completed/Met Date Met:  01/22/14 Goal: Hemodynamically stable Outcome: Completed/Met Date Met:  01/22/14 Goal: Other Phase I Outcomes/Goals Outcome: Not Applicable Date Met:  49/82/64  Problem: Phase II Progression Outcomes Goal: Discharge plan established Outcome: Completed/Met Date Met:  01/22/14 Goal: IV changed to normal saline lock Outcome: Completed/Met Date Met:  01/22/14 Goal: Obtain order to discontinue catheter if appropriate Outcome: Not Applicable Date Met:  15/83/09 Goal: Other Phase II Outcomes/Goals Outcome: Not Applicable Date Met:  40/76/80  Problem: Phase III Progression Outcomes Goal: Pain controlled on oral analgesia Outcome: Not Applicable Date Met:  88/11/03 Goal: Activity at appropriate level-compared to baseline (UP IN CHAIR FOR HEMODIALYSIS)  Outcome: Completed/Met Date Met:  01/22/14 Goal: Voiding independently Outcome: Completed/Met Date Met:  01/22/14 Goal: IV/normal saline lock discontinued Outcome: Completed/Met Date Met:  01/22/14 Goal: Foley discontinued Outcome: Not Applicable Date Met:  15/94/58 Goal: Discharge plan remains appropriate-arrangements made Outcome: Completed/Met Date Met:  01/22/14 Goal: Other Phase III Outcomes/Goals Outcome: Not Applicable Date Met:  59/29/24  Problem: Discharge Progression Outcomes Goal: Discharge plan in place and appropriate Outcome: Completed/Met Date Met:  01/22/14 Goal: Pain controlled with appropriate interventions Outcome: Completed/Met Date Met:  01/22/14 Goal: Hemodynamically stable Outcome: Completed/Met Date Met:  46/28/63 Goal: Complications resolved/controlled Outcome: Completed/Met Date Met:  01/22/14 Goal: Tolerating diet Outcome: Completed/Met Date Met:  01/22/14 Goal: Activity appropriate for discharge plan Outcome: Completed/Met Date Met:  01/22/14

## 2014-01-22 NOTE — Discharge Instructions (Signed)
Hypercalcemia Hypercalcemia means the calcium in your blood is too high. Calcium in our blood is important for the control of many things, such as:  Blood clotting.  Conducting of nerve impulses.  Muscle contraction.  Maintaining teeth and bone health.  Other body functions. In the bloodstream, calcium maintains a constant balance with another mineral, phosphate. Calcium is absorbed into the body through the small intestine. This is helped by vitamin D. Calcium levels are maintained mostly by vitamin D and a hormone (parathyroid hormone). But the kidneys also help. Hypercalcemia can happen when the concentration of calcium is too high for the kidneys to maintain balance. The body maintains a balance between the calcium we eat and the calcium already in our body. If calcium intake is increased or we cannot use calcium properly, there may be problems. Some common sources of calcium are:   Dairy products.  Nuts.  Eggs.  Whole grains.  Legumes.  Green leafy vegetables. CAUSES There are many causes of this condition, but some common ones are:  Hyperparathyroidism. This is an overactivity of the parathyroid gland.  Cancers of the breast, kidney, lung, head, and neck are common causes of calcium increases.  Medications that cause you to urinate more often (diuretics), nausea, vomiting, and diarrhea also increase the calcium in the blood.  Overuse of calcium-containing antacids. SYMPTOMS  Many patients with mild hypercalcemia have no symptoms. For those with symptoms, common problems include:  Loss of appetite.  Constipation.  Increased thirst.  Heart rhythm changes.  Abnormal thinking.  Nausea.  Abdominal pain.  Kidney stones.  Mood swings.  Coma and death when severe.  Vomiting.  Increased urination.  High blood pressure.  Confusion. DIAGNOSIS   Your caregiver will do a medical history and perform a physical exam on you.  Calcium and parathyroid hormone  (PTH) may be measured with a blood test. TREATMENT   The treatment depends on the calcium level and what is causing the higher level. Hypercalcemia can be life threatening. Fast lowering of the calcium level may be necessary.  With normal kidney function, fluids can be given by vein to clear the excess calcium. Hemodialysis works well to reduce dangerous calcium levels if there is poor kidney function. This is a procedure in which a machine is used to filter out unwanted substances. The blood is then returned to the body.  Drugs, such as diuretics, can be given after adequate fluid intake is established. These medications help the kidneys get rid of extra calcium. Drugs that lessen (inhibit) bone loss are helpful in gaining long-term control. Phosphate pills help lower high calcium levels caused by a low supply of phosphate. Anti-inflammatory agents such as steroids are helpful with some cancers and toxic levels of vitamin D.  Treatment of the underlying cause of the hypercalcemia will also correct the imbalance. Hyperparathyroidism is usually treated by surgical removal of one or more of the parathyroid glands and any tissue, other than the glands themselves, that is producing too much hormone.  The hypercalcemia caused by cancer is difficult to treat without controlling the cancer. Symptoms can be improved with fluids and drug therapy as outlined above. PROGNOSIS   Surgery to remove the parathyroid glands is usually successful. This also depends on the amount of damage to the kidneys and whether or not it can be treated.  Mild hypercalcemia can be controlled with good fluid intake and the use of effective medications.  Hypercalcemia often develops as a late complication of cancer. The expected outlook   is poor without effective anticancer therapy. PREVENTION   If you are at risk for developing hypercalcemia, be familiar with early symptoms. Report these to your caregiver.  Good fluid intake  (up to four quarts of liquid a day if possible) is helpful.  Try to control nausea and vomiting, and treat fevers to avoid dehydration.  Lowering the amount of calcium in your diet is not necessary. High blood calcium reduces absorption of calcium in the intestine.  Stay as active as possible. SEEK IMMEDIATE MEDICAL CARE IF:   You develop chest pain, sweating, or shortness of breath.  You get confused, feel faint or pass out.  You develop severe nausea and vomiting. MAKE SURE YOU:   Understand these instructions.  Will watch your condition.  Will get help right away if you are not doing well or get worse. Document Released: 05/10/2004 Document Revised: 07/11/2013 Document Reviewed: 02/19/2010 Exit[employer] Patient Information 2015 [employer]. This information is not intended to replace advice given to you by your health care provider. Make sure you discuss any questions you have with your health care provider.  

## 2014-01-22 NOTE — Plan of Care (Signed)
Problem: Phase I Progression Outcomes Goal: Pain controlled with appropriate interventions Outcome: Completed/Met Date Met:  01/22/14 Goal: OOB as tolerated unless otherwise ordered Outcome: Completed/Met Date Met:  01/22/14  Problem: Phase II Progression Outcomes Goal: Progress activity as tolerated unless otherwise ordered Outcome: Completed/Met Date Met:  01/22/14 Goal: Vital signs remain stable Outcome: Completed/Met Date Met:  01/22/14

## 2014-01-22 NOTE — Progress Notes (Signed)
S: No CO O:BP 137/66 mmHg  Pulse 57  Temp(Src) 97.9 F (36.6 C) (Oral)  Resp 20  Ht 5\' 3"  (1.6 m)  Wt 89.54 kg (197 lb 6.4 oz)  BMI 34.98 kg/m2  SpO2 100%  Intake/Output Summary (Last 24 hours) at 01/22/14 1000 Last data filed at 01/22/14 0931  Gross per 24 hour  Intake   2100 ml  Output   3250 ml  Net  -1150 ml   Weight change:  [id number] and alert CVS:RRR Resp:clear Abd:+ BS NTND Ext:No edema NEURO:CNI Ox3 no asterixis   . amLODipine  10 mg Oral Daily  . aspirin EC  81 mg Oral Daily  . atorvastatin  40 mg Oral q1800  . cloNIDine  0.2 mg Oral TID  . doxazosin  2 mg Oral Daily  . furosemide  80 mg Oral BID  . heparin  5,000 Units Subcutaneous 3 times per day  . insulin aspart  0-20 Units Subcutaneous TID WC  . insulin aspart  0-5 Units Subcutaneous QHS  . insulin aspart protamine- aspart  50 Units Subcutaneous Q breakfast  . levothyroxine  100 mcg Oral QAC breakfast  . omega-3 acid ethyl esters  1 g Oral Daily  . senna  1 tablet Oral Daily  . triazolam  0.5 mg Oral QHS   No results found. BMET    Component Value Date/Time   NA 141 01/22/2014 0505   K 3.7 01/22/2014 0505   CL 98 01/22/2014 0505   CO2 27 01/22/2014 0505   GLUCOSE 152* 01/22/2014 0505   BUN 71* 01/22/2014 0505   CREATININE 5.77* 01/22/2014 0505   CALCIUM 10.9* 01/22/2014 0505   CALCIUM 14.5* 01/19/2014 2354   GFRNONAA 7* 01/22/2014 0505   GFRAA 8* 01/22/2014 0505   CBC    Component Value Date/Time   WBC 8.8 01/21/2014 0610   RBC 3.22* 01/21/2014 0610   HGB 9.3* 01/21/2014 0610   HCT 28.4* 01/21/2014 0610   PLT 261 01/21/2014 0610   MCV 88.2 01/21/2014 0610   MCH 28.9 01/21/2014 0610   MCHC 32.7 01/21/2014 0610   RDW 13.1 01/21/2014 0610   LYMPHSABS 2.4 01/19/2014 1905   MONOABS 1.0 01/19/2014 1905   EOSABS 0.1 01/19/2014 1905   BASOSABS 0.0 01/19/2014 1905     Assessment: 1. Hypercalcemia most likely sec to meds, improving 2. CKD 5, stable 3. Anemia 4 DM 5.  HTN  Plan: 1.She can be DC'd.  I gave her a script for lasix 80mg  BID.  My office will call her about FU labs on Wed/Thurs.  Meiya Wisler T

## 2014-01-22 NOTE — Progress Notes (Signed)
Patient has discharge home order.  Patient advises her car is parked in ED parking lot and she plans to drive herself home, MD aware.  Reviewed discharge paperwork with patient, patient verbalizes understanding.  Patient has prescription for lasix MD gave her.  Patient will be escorted from floor via wheelchair with NT when ready to leave.  Christen Bame RN

## 2014-01-24 LAB — PROTEIN ELECTROPHORESIS, SERUM
ALBUMIN ELP: 50.4 % — AB (ref 55.8–66.1)
Alpha-1-Globulin: 5.3 % — ABNORMAL HIGH (ref 2.9–4.9)
Alpha-2-Globulin: 15.1 % — ABNORMAL HIGH (ref 7.1–11.8)
BETA 2: 5.9 % (ref 3.2–6.5)
BETA GLOBULIN: 6.7 % (ref 4.7–7.2)
Gamma Globulin: 16.6 % (ref 11.1–18.8)
M-Spike, %: NOT DETECTED g/dL
Total Protein ELP: 7.1 g/dL (ref 6.0–8.3)

## 2014-01-26 ENCOUNTER — Encounter: Payer: Self-pay | Admitting: Vascular Surgery

## 2014-01-26 DIAGNOSIS — F419 Anxiety disorder, unspecified: Secondary | ICD-10-CM | POA: Diagnosis not present

## 2014-01-26 DIAGNOSIS — F450 Somatization disorder: Secondary | ICD-10-CM | POA: Diagnosis not present

## 2014-01-26 DIAGNOSIS — N185 Chronic kidney disease, stage 5: Secondary | ICD-10-CM | POA: Diagnosis not present

## 2014-01-27 ENCOUNTER — Encounter: Payer: Self-pay | Admitting: Vascular Surgery

## 2014-01-27 ENCOUNTER — Ambulatory Visit (INDEPENDENT_AMBULATORY_CARE_PROVIDER_SITE_OTHER)
Admission: RE | Admit: 2014-01-27 | Discharge: 2014-01-27 | Disposition: A | Discharge status: Home / Self Care | Payer: Medicare Other | Source: Ambulatory Visit | Attending: Vascular Surgery | Admitting: Vascular Surgery

## 2014-01-27 ENCOUNTER — Ambulatory Visit (INDEPENDENT_AMBULATORY_CARE_PROVIDER_SITE_OTHER): Payer: Medicare Other | Admitting: Vascular Surgery

## 2014-01-27 ENCOUNTER — Ambulatory Visit (HOSPITAL_COMMUNITY)
Admission: RE | Admit: 2014-01-27 | Discharge: 2014-01-27 | Disposition: A | Discharge status: Home / Self Care | Payer: Medicare Other | Source: Ambulatory Visit | Attending: Vascular Surgery | Admitting: Vascular Surgery

## 2014-01-27 VITALS — BP 165/88 | HR 86 | Resp 18 | Ht 63.0 in | Wt 194.0 lb

## 2014-01-27 DIAGNOSIS — N185 Chronic kidney disease, stage 5: Secondary | ICD-10-CM | POA: Diagnosis not present

## 2014-01-27 DIAGNOSIS — Z01810 Encounter for preprocedural cardiovascular examination: Secondary | ICD-10-CM

## 2014-01-27 DIAGNOSIS — N184 Chronic kidney disease, stage 4 (severe): Secondary | ICD-10-CM

## 2014-01-27 DIAGNOSIS — E1122 Type 2 diabetes mellitus with diabetic chronic kidney disease: Secondary | ICD-10-CM | POA: Diagnosis not present

## 2014-01-27 DIAGNOSIS — I2510 Atherosclerotic heart disease of native coronary artery without angina pectoris: Secondary | ICD-10-CM

## 2014-01-27 NOTE — Progress Notes (Signed)
Referred by:  Gara Kroner, MD [place], [place], [place] [address]  Reason for referral: New access  History of Present Illness  Sandy Roy is a 63 y.o. ([birth year]) female who presents for evaluation for permanent access.  The patient is right hand dominant.  The patient has not had previous access procedures.  Previous central venous cannulation procedures include: none.  The patient has never had a PPM placed.   Past Medical History  Diagnosis Date  . CONGESTIVE HEART FAILURE 12/14/2006  . CORONARY ARTERY DISEASE 12/14/2006  . DIABETES MELLITUS, TYPE II 12/14/2006  . HYPERLIPIDEMIA 12/14/2006  . HYPERTENSION 12/14/2006  . Hypoparathyroidism 12/21/2006  . Hypopotassemia 01/13/2007  . HYPOTHYROIDISM, POSTSURGICAL 12/15/2006  . NEOPLASM, MALIGNANT, THYROID GLAND 12/15/2006    Stage 1 papillary adenocarcinoma, one very small focus each lobe (53mm, 21mm)  6/09: CT neck: no evidence of recurrence  . OBSTRUCTIVE SLEEP APNEA 02/23/2007  . Chronic kidney disease     Past Surgical History  Procedure Laterality Date  . Thyroidectomy  2008  . Cholecystectomy      History   Social History  . Marital Status: Widowed    Spouse Name: N/A    Number of Children: N/A  . Years of Education: N/A   Occupational History  . Not on file.   Social History Main Topics  . Smoking status: Former Research scientist (life sciences)  . Smokeless tobacco: Never Used  . Alcohol Use: No  . Drug Use: No  . Sexual Activity: Not on file   Other Topics Concern  . Not on file   Social History Narrative    Family History  Problem Relation Age of Onset  . Cancer Other   . COPD Other   . Hypertension Other   . Hyperlipidemia Other   . Stroke Other   . Heart disease Other   . Diabetes Other   . Heart failure Mother     Current Outpatient Prescriptions on File Prior to Visit  Medication Sig Dispense Refill  . amLODipine (NORVASC) 10 MG tablet Take 10 mg by mouth daily.    Marland Kitchen aspirin (BAYER LOW  STRENGTH) 81 MG EC tablet Take 81 mg by mouth daily.      Marland Kitchen atorvastatin (LIPITOR) 40 MG tablet Take 1 tablet (40 mg total) by mouth daily. 30 tablet 6  . cloNIDine (CATAPRES) 0.2 MG tablet Take 0.2 mg by mouth 3 (three) times daily.     Marland Kitchen doxazosin (CARDURA) 2 MG tablet Take 2 mg by mouth daily.    . furosemide (LASIX) 80 MG tablet Take 1 tablet (80 mg total) by mouth 2 (two) times daily. 60 tablet 0  . glucose blood (ONETOUCH VERIO) test strip 1 each by Other route 2 (two) times daily. And lancets 2/day 250.01 60 each 12  . insulin NPH-regular Human (NOVOLIN 70/30) (70-30) 100 UNIT/ML injection Inject 50 Units into the skin daily with breakfast. 20 mL 11  . Insulin Syringe-Needle U-100 (B-D INSULIN SYRINGE) 31G X 5/16" 0.3 ML MISC Use 1 time per day. 100 each 1  . levothyroxine (SYNTHROID, LEVOTHROID) 100 MCG tablet Take 1 tablet (100 mcg total) by mouth daily before breakfast. 30 tablet 2  . Multiple Vitamins-Minerals (MULTIVITAMIN & MINERAL PO) Take 1 tablet by mouth daily.    . nitroGLYCERIN (NITROSTAT) 0.4 MG SL tablet Place 1 tablet (0.4 mg total) under the tongue every 5 (five) minutes as needed. 25 tablet 5  . triazolam (HALCION) 0.25 MG tablet  2 tablets by mouth at bedtime     . fish oil-omega-3 fatty acids 1000 MG capsule Take 1 capsule by mouth daily.       No current facility-administered medications on file prior to visit.    No Known Allergies   REVIEW OF SYSTEMS:  (Positives checked otherwise negative)  CARDIOVASCULAR:  []  chest pain, []  chest pressure, []  palpitations, [x]  shortness of breath when laying flat, []  shortness of breath with exertion,  []  pain in feet when walking, []  pain in feet when laying flat, []  history of blood clot in veins (DVT), []  history of phlebitis, [x]  swelling in legs, []  varicose veins  PULMONARY:  []  productive cough, []  asthma, []  wheezing  NEUROLOGIC:  [x]  weakness in arms or legs, []  numbness in arms or legs, []  difficulty speaking or  slurred speech, []  temporary loss of vision in one eye, []  dizziness  HEMATOLOGIC:  []  bleeding problems, []  problems with blood clotting too easily  MUSCULOSKEL:  []  joint pain, []  joint swelling  GASTROINTEST:  []  vomiting blood, []  blood in stool     GENITOURINARY:  []  burning with urination, []  blood in urine  PSYCHIATRIC:  []  history of major depression  INTEGUMENTARY:  []  rashes, []  ulcers  CONSTITUTIONAL:  []  fever, [x]  chills  Physical Examination  Filed Vitals:   01/27/14 1628  BP: 165/88  Pulse: 86  Resp: 18  Height: 5\' 3"  (1.6 m)  Weight: 194 lb (87.998 kg)   Body mass index is 34.37 kg/(m^2).  General: A&O x 3, WDWN  Head: [place]/AT  Ear/Nose/Throat: Hearing grossly intact, nares w/o erythema or drainage, oropharynx w/o Erythema/Exudate, Mallampati score: 3  Eyes: PERRLA, EOMI  Neck: Supple, no nuchal rigidity, no palpable LAD  Pulmonary: Sym exp, good air movt, CTAB, no rales, rhonchi, & wheezing  Cardiac: RRR, Nl S1, S2, no Murmurs, rubs or gallops  Vascular: Vessel Right Left  Radial Palpable Palpable  Ulnar Palpable Palpable  Brachial Palpable Palpable  Carotid Palpable, without bruit Palpable, without bruit  Aorta Not palpable N/A  Femoral Palpable Palpable  Popliteal Not palpable Not palpable  PT Not Palpable Not Palpable  DP Not Palpable Not Palpable   Gastrointestinal: soft, NTND, -G/R, - HSM, - masses, - CVAT B  Musculoskeletal: M/S 5/5 throughout , Extremities without ischemic changes   Neurologic: CN 2-12 intact , Pain and light touch intact in extremities , Motor exam as listed above  Psychiatric: Judgment intact, Depress mood & affect, appropriate for pt's clinical situation  Dermatologic: See M/S exam for extremity exam, no rashes otherwise noted  Lymph : No Cervical, Axillary, or Inguinal lymphadenopathy   Non-Invasive Vascular Imaging  Vein Mapping  (Date: 01/27/2014):   R arm: acceptable vein conduits include marginal R  basilic vein    L arm: acceptable vein conduits include none  BUE Doppler (Date: 01/27/2014):   R arm:   Brachial: 4.0 mm, tri  Radial: 2.1 mm, tri  Ulnar: NV  L arm:   Brachial: 4.5 mm, tri  Radial: 2.0 mm, tri  Ulnar: 1.2 mm, mono  Outside Studies/Documentation 10 pages of outside documents were reviewed including: outpatient nephrology chart.  Medical Decision Making  AILEAN KOLODZIEJCZYK is a 63 y.o. female who presents with ESRD requiring hemodialysis.   Based on vein mapping and examination, this patient's permanent access options include: R stage BVT  I had an extensive discussion with this patient in regards to the nature of access surgery, including risk, benefits, and alternatives.  The patient is aware that the risks of access surgery include but are not limited to: bleeding, infection, steal syndrome, nerve damage, ischemic monomelic neuropathy, failure of access to mature, and possible need for additional access procedures in the future. I discussed with the patient the nature of the staged access procedure, specifically the need for a second operation to transpose the first stage fistula if it matures adequately.    The patient has agreed to proceed with the above procedure which will be scheduled 1 DEC 15.  Adele Barthel, MD [facility] Office: [phone] Pager: [phone]  01/27/2014, 5:17 PM

## 2014-01-30 ENCOUNTER — Other Ambulatory Visit: Payer: Self-pay

## 2014-02-06 ENCOUNTER — Encounter (HOSPITAL_COMMUNITY): Payer: Self-pay | Admitting: *Deleted

## 2014-02-06 MED ORDER — CHLORHEXIDINE GLUCONATE CLOTH 2 % EX PADS: 6.0000 | MEDICATED_PAD | Freq: Once | CUTANEOUS | Status: DC

## 2014-02-06 MED ORDER — DEXTROSE 5 % IV SOLN
1.5000 g | INTRAVENOUS | Status: AC
  Administered 2014-02-07: 1.5 g via INTRAVENOUS
  Filled 2014-02-06: qty 1.5

## 2014-02-06 MED ORDER — SODIUM CHLORIDE 0.9 % IV SOLN
INTRAVENOUS | Status: DC
  Administered 2014-02-07: 08:00:00 via INTRAVENOUS

## 2014-02-06 NOTE — Progress Notes (Signed)
Pt denies chest pain or sob. States she has NTG, but has never had to take it.

## 2014-02-07 ENCOUNTER — Encounter (HOSPITAL_COMMUNITY)
Admission: RE | Disposition: A | Discharge status: Home / Self Care | Payer: Self-pay | Source: Ambulatory Visit | Attending: Vascular Surgery

## 2014-02-07 ENCOUNTER — Ambulatory Visit (HOSPITAL_COMMUNITY)
Admission: RE | Admit: 2014-02-07 | Discharge: 2014-02-07 | Disposition: A | Discharge status: Home / Self Care | Payer: Medicare Other | Source: Ambulatory Visit | Attending: Vascular Surgery | Admitting: Vascular Surgery

## 2014-02-07 ENCOUNTER — Encounter (HOSPITAL_COMMUNITY): Payer: Self-pay | Admitting: *Deleted

## 2014-02-07 ENCOUNTER — Ambulatory Visit (HOSPITAL_COMMUNITY): Payer: Medicare Other | Admitting: Anesthesiology

## 2014-02-07 DIAGNOSIS — Z794 Long term (current) use of insulin: Secondary | ICD-10-CM | POA: Insufficient documentation

## 2014-02-07 DIAGNOSIS — E119 Type 2 diabetes mellitus without complications: Secondary | ICD-10-CM | POA: Diagnosis not present

## 2014-02-07 DIAGNOSIS — E785 Hyperlipidemia, unspecified: Secondary | ICD-10-CM | POA: Insufficient documentation

## 2014-02-07 DIAGNOSIS — N186 End stage renal disease: Secondary | ICD-10-CM | POA: Insufficient documentation

## 2014-02-07 DIAGNOSIS — N184 Chronic kidney disease, stage 4 (severe): Secondary | ICD-10-CM | POA: Diagnosis not present

## 2014-02-07 DIAGNOSIS — Z7982 Long term (current) use of aspirin: Secondary | ICD-10-CM | POA: Insufficient documentation

## 2014-02-07 DIAGNOSIS — I509 Heart failure, unspecified: Secondary | ICD-10-CM | POA: Diagnosis not present

## 2014-02-07 DIAGNOSIS — I120 Hypertensive chronic kidney disease with stage 5 chronic kidney disease or end stage renal disease: Secondary | ICD-10-CM | POA: Diagnosis not present

## 2014-02-07 DIAGNOSIS — Z87891 Personal history of nicotine dependence: Secondary | ICD-10-CM | POA: Diagnosis not present

## 2014-02-07 DIAGNOSIS — I2510 Atherosclerotic heart disease of native coronary artery without angina pectoris: Secondary | ICD-10-CM | POA: Insufficient documentation

## 2014-02-07 HISTORY — PX: BASCILIC VEIN TRANSPOSITION: SHX5742

## 2014-02-07 HISTORY — DX: Depression, unspecified: F32.A

## 2014-02-07 HISTORY — DX: Fracture of orbit, unspecified, initial encounter for closed fracture: S02.85XA

## 2014-02-07 HISTORY — DX: Major depressive disorder, single episode, unspecified: F32.9

## 2014-02-07 LAB — POCT I-STAT 4, (NA,K, GLUC, HGB,HCT)
GLUCOSE: 156 mg/dL — AB (ref 70–99)
HCT: 31 % — ABNORMAL LOW (ref 36.0–46.0)
Hemoglobin: 10.5 g/dL — ABNORMAL LOW (ref 12.0–15.0)
Potassium: 3.7 mEq/L (ref 3.7–5.3)
Sodium: 141 mEq/L (ref 137–147)

## 2014-02-07 LAB — GLUCOSE, CAPILLARY
Glucose-Capillary: 154 mg/dL — ABNORMAL HIGH (ref 70–99)
Glucose-Capillary: 162 mg/dL — ABNORMAL HIGH (ref 70–99)

## 2014-02-07 SURGERY — TRANSPOSITION, VEIN, BASILIC
Anesthesia: General | Site: Arm Lower | Laterality: Right

## 2014-02-07 MED ORDER — MIDAZOLAM HCL 5 MG/5ML IJ SOLN
INTRAMUSCULAR | Status: DC | PRN
  Administered 2014-02-07: 2 mg via INTRAVENOUS

## 2014-02-07 MED ORDER — LIDOCAINE-EPINEPHRINE (PF) 1 %-1:200000 IJ SOLN
INTRAMUSCULAR | Status: AC
  Filled 2014-02-07: qty 10

## 2014-02-07 MED ORDER — PHENYLEPHRINE HCL 10 MG/ML IJ SOLN
INTRAMUSCULAR | Status: DC | PRN
  Administered 2014-02-07: 80 ug via INTRAVENOUS
  Administered 2014-02-07: 120 ug via INTRAVENOUS
  Administered 2014-02-07: 40 ug via INTRAVENOUS

## 2014-02-07 MED ORDER — OXYCODONE HCL 5 MG/5ML PO SOLN: 5.0000 mg | Freq: Once | ORAL | Status: DC | PRN

## 2014-02-07 MED ORDER — LIDOCAINE HCL (CARDIAC) 20 MG/ML IV SOLN
INTRAVENOUS | Status: DC | PRN
  Administered 2014-02-07: 80 mg via INTRAVENOUS

## 2014-02-07 MED ORDER — LIDOCAINE HCL (CARDIAC) 20 MG/ML IV SOLN
INTRAVENOUS | Status: AC
  Filled 2014-02-07: qty 5

## 2014-02-07 MED ORDER — ONDANSETRON HCL 4 MG/2ML IJ SOLN: 4.0000 mg | Freq: Once | INTRAMUSCULAR | Status: DC | PRN

## 2014-02-07 MED ORDER — PHENYLEPHRINE 40 MCG/ML (10ML) SYRINGE FOR IV PUSH (FOR BLOOD PRESSURE SUPPORT)
PREFILLED_SYRINGE | INTRAVENOUS | Status: AC
  Filled 2014-02-07: qty 10

## 2014-02-07 MED ORDER — FENTANYL CITRATE 0.05 MG/ML IJ SOLN: 25.0000 ug | INTRAMUSCULAR | Status: DC | PRN

## 2014-02-07 MED ORDER — FENTANYL CITRATE 0.05 MG/ML IJ SOLN
INTRAMUSCULAR | Status: AC
  Filled 2014-02-07: qty 5

## 2014-02-07 MED ORDER — THROMBIN 20000 UNITS EX SOLR
CUTANEOUS | Status: DC | PRN
  Administered 2014-02-07: 10 mL via TOPICAL

## 2014-02-07 MED ORDER — PROPOFOL 10 MG/ML IV BOLUS
INTRAVENOUS | Status: DC | PRN
  Administered 2014-02-07: 130 mg via INTRAVENOUS

## 2014-02-07 MED ORDER — BUPIVACAINE HCL (PF) 0.5 % IJ SOLN
INTRAMUSCULAR | Status: DC | PRN
  Administered 2014-02-07: 10 mL

## 2014-02-07 MED ORDER — MIDAZOLAM HCL 2 MG/2ML IJ SOLN
INTRAMUSCULAR | Status: AC
  Filled 2014-02-07: qty 2

## 2014-02-07 MED ORDER — THROMBIN 20000 UNITS EX SOLR
CUTANEOUS | Status: AC
  Filled 2014-02-07: qty 20000

## 2014-02-07 MED ORDER — 0.9 % SODIUM CHLORIDE (POUR BTL) OPTIME
TOPICAL | Status: DC | PRN
  Administered 2014-02-07: 1000 mL

## 2014-02-07 MED ORDER — SODIUM CHLORIDE 0.9 % IV SOLN
INTRAVENOUS | Status: DC | PRN
  Administered 2014-02-07: 09:00:00 via INTRAVENOUS

## 2014-02-07 MED ORDER — OXYCODONE HCL 5 MG PO TABS: 5.0000 mg | ORAL_TABLET | Freq: Once | ORAL | Status: DC | PRN

## 2014-02-07 MED ORDER — BUPIVACAINE HCL (PF) 0.5 % IJ SOLN
INTRAMUSCULAR | Status: AC
  Filled 2014-02-07: qty 10

## 2014-02-07 MED ORDER — OXYCODONE-ACETAMINOPHEN 5-325 MG PO TABS: 1.0000 | ORAL_TABLET | Freq: Four times a day (QID) | ORAL | Status: DC | PRN

## 2014-02-07 MED ORDER — SODIUM CHLORIDE 0.9 % IR SOLN
Status: DC | PRN
  Administered 2014-02-07: 100 mL

## 2014-02-07 MED ORDER — ONDANSETRON HCL 4 MG/2ML IJ SOLN
INTRAMUSCULAR | Status: DC | PRN
  Administered 2014-02-07: 4 mg via INTRAVENOUS

## 2014-02-07 MED ORDER — PROPOFOL 10 MG/ML IV BOLUS
INTRAVENOUS | Status: AC
  Filled 2014-02-07: qty 20

## 2014-02-07 MED ORDER — FENTANYL CITRATE 0.05 MG/ML IJ SOLN
INTRAMUSCULAR | Status: DC | PRN
  Administered 2014-02-07 (×2): 50 ug via INTRAVENOUS

## 2014-02-07 SURGICAL SUPPLY — 37 items
CANISTER SUCTION 2500CC (MISCELLANEOUS) ×3 IMPLANT
CLIP TI MEDIUM 24 (CLIP) ×3 IMPLANT
CLIP TI WIDE RED SMALL 24 (CLIP) ×3 IMPLANT
CORDS BIPOLAR (ELECTRODE) ×3 IMPLANT
COVER PROBE W GEL 5X96 (DRAPES) ×3 IMPLANT
COVER SURGICAL LIGHT HANDLE (MISCELLANEOUS) ×3 IMPLANT
DECANTER SPIKE VIAL GLASS SM (MISCELLANEOUS) ×3 IMPLANT
DERMABOND ADVANCED (GAUZE/BANDAGES/DRESSINGS) ×2
DERMABOND ADVANCED .7 DNX12 (GAUZE/BANDAGES/DRESSINGS) ×1 IMPLANT
ELECT REM PT RETURN 9FT ADLT (ELECTROSURGICAL) ×3
ELECTRODE REM PT RTRN 9FT ADLT (ELECTROSURGICAL) ×1 IMPLANT
GLOVE BIO SURGEON STRL SZ 6.5 (GLOVE) ×4 IMPLANT
GLOVE BIO SURGEON STRL SZ7 (GLOVE) ×3 IMPLANT
GLOVE BIO SURGEONS STRL SZ 6.5 (GLOVE) ×2
GLOVE BIOGEL PI IND STRL 7.5 (GLOVE) ×2 IMPLANT
GLOVE BIOGEL PI INDICATOR 7.5 (GLOVE) ×4
GLOVE ECLIPSE 7.0 STRL STRAW (GLOVE) ×3 IMPLANT
GOWN STRL REUS W/ TWL LRG LVL3 (GOWN DISPOSABLE) ×3 IMPLANT
GOWN STRL REUS W/TWL LRG LVL3 (GOWN DISPOSABLE) ×6
KIT BASIN OR (CUSTOM PROCEDURE TRAY) ×3 IMPLANT
KIT ROOM TURNOVER OR (KITS) ×3 IMPLANT
LIQUID BAND (GAUZE/BANDAGES/DRESSINGS) ×3 IMPLANT
NS IRRIG 1000ML POUR BTL (IV SOLUTION) ×3 IMPLANT
PACK CV ACCESS (CUSTOM PROCEDURE TRAY) ×3 IMPLANT
PAD ARMBOARD 7.5X6 YLW CONV (MISCELLANEOUS) ×6 IMPLANT
PROBE PENCIL 8 MHZ STRL DISP (MISCELLANEOUS) IMPLANT
SPONGE SURGIFOAM ABS GEL 100 (HEMOSTASIS) IMPLANT
SUT MNCRL AB 4-0 PS2 18 (SUTURE) ×3 IMPLANT
SUT PROLENE 6 0 BV (SUTURE) ×3 IMPLANT
SUT PROLENE 7 0 BV 1 (SUTURE) ×3 IMPLANT
SUT SILK 2 0 SH (SUTURE) ×3 IMPLANT
SUT VIC AB 2-0 CT1 27 (SUTURE) ×2
SUT VIC AB 2-0 CT1 TAPERPNT 27 (SUTURE) ×1 IMPLANT
SUT VIC AB 3-0 SH 27 (SUTURE) ×4
SUT VIC AB 3-0 SH 27X BRD (SUTURE) ×2 IMPLANT
UNDERPAD 30X30 INCONTINENT (UNDERPADS AND DIAPERS) ×3 IMPLANT
WATER STERILE IRR 1000ML POUR (IV SOLUTION) ×3 IMPLANT

## 2014-02-07 NOTE — Anesthesia Procedure Notes (Signed)
Procedure Name: LMA Insertion Date/Time: 02/07/2014 9:11 AM Performed by: Trixie Deis A Pre-anesthesia Checklist: Emergency Drugs available, Patient identified, Suction available, Patient being monitored and Timeout performed Patient Re-evaluated:Patient Re-evaluated prior to inductionOxygen Delivery Method: Circle system utilized Preoxygenation: Pre-oxygenation with 100% oxygen Intubation Type: IV induction Ventilation: Mask ventilation without difficulty LMA: LMA inserted LMA Size: 4.0 Number of attempts: 1 Placement Confirmation: positive ETCO2 and breath sounds checked- equal and bilateral Tube secured with: Tape Dental Injury: Teeth and Oropharynx as per pre-operative assessment

## 2014-02-07 NOTE — Op Note (Signed)
OPERATIVE NOTE   PROCEDURE: 1. right first stage basilic vein transposition (brachiobasilic arteriovenous fistula) placement  PRE-OPERATIVE DIAGNOSIS: chronic kidney disease stage IV-V  POST-OPERATIVE DIAGNOSIS: same as above   SURGEON: Adele Barthel, MD  ASSISTANT(S): Gerri Lins, PAC   ANESTHESIA: general  ESTIMATED BLOOD LOSS: 50 cc  FINDING(S): 1. Palpable thrill at end of case 2. Palpable radial pulse at end ofcase  SPECIMEN(S):  none  INDICATIONS:   Sandy Roy is a 63 y.o. female who presents with chronic kidney disease stage IV-V.  The patient is scheduled for right first stage basilic vein transposition.  The patient is aware the risks include but are not limited to: bleeding, infection, steal syndrome, nerve damage, ischemic monomelic neuropathy, failure to mature, and need for additional procedures.  The patient is aware of the risks of the procedure and elects to proceed forward.  DESCRIPTION: After full informed written consent was obtained from the patient, the patient was brought back to the operating room and placed supine upon the operating table.  Prior to induction, the patient received IV antibiotics.   After obtaining adequate anesthesia, the patient was then prepped and draped in the standard fashion for a right arm access procedure.  I turned my attention first to identifying the patient's basilic vein and brachial artery.  Using SonoSite guidance, the location of these vessels were marked out on the skin.   At this point, I injected local anesthetic to obtain a field block of the antecubitum.  In total, I injected about 5 mL of a 1:1 mixture of 0.5% Marcaine without epinephrine and 1% lidocaine with epinephrine.  I made a transverse incision at the level of the antecubitum and dissected through the subcutaneous tissue and fascia to gain exposure of the brachial artery.  This was noted to be 4-5 mm in diameter externally.  This was dissected out  proximally and distally and controlled with vessel loops .  I then dissected out the basilic vein.  In this process, I found a cubital vein draining into the basilic vein.  The basilic vein was extremely medial, so I felt using this cubital vein would help get the necessary length.  This was noted to be 2.5-3.0 mm in diameter externally.  The distal segment of the vein was ligated with a  2-0 silk, and the vein was transected.  The proximal segment was iinterrogated with serial dilators.  The vein accepted up to a 4 mm dilator without any difficulty.  I then instilled the heparinized saline into the vein and clamped it.  At this point, I reset my exposure of the brachial artery and placed the artery under tension proximally and distally.  I made an arteriotomy with a #11 blade, and then I extended the arteriotomy with a Potts scissor.  I injected heparinized saline proximal and distal to this arteriotomy.  The vein was then sewn to the artery in an end-to-side configuration with a running stitch of 7-0 Prolene.  Prior to completing this anastomosis, I allowed the vein and artery to backbleed.  There was no evidence of clot from any vessels.  I completed the anastomosis in the usual fashion and then released all vessel loops and clamps.  There was a palpable thrill in the venous outflow, and there was a palpable radial pulse.  At this point, I irrigated out the surgical wound.  There was no further active bleeding.  The subcutaneous tissue was reapproximated with a running stitch of 3-0 Vicryl.  The skin was then reapproximated with a running subcuticular stitch of 4-0 Vicryl.  The skin was then cleaned, dried, and reinforced with Dermabond.  The patient tolerated this procedure well.   COMPLICATIONS: none  CONDITION: stable  Adele Barthel, MD [facility] Office: [phone] Pager: [phone]  02/07/2014, 10:22 AM

## 2014-02-07 NOTE — Transfer of Care (Signed)
Immediate Anesthesia Transfer of Care Note  Patient: Sandy Roy  Procedure(s) Performed: Procedure(s): FIRST STAGE BASILIC VEIN TRANSPOSITION (Right)  Patient Location: PACU  Anesthesia Type:General  Level of Consciousness: awake, alert  and oriented  Airway & Oxygen Therapy: Patient Spontanous Breathing and Patient connected to nasal cannula oxygen  Post-op Assessment: Report given to PACU RN, Post -op Vital signs reviewed and stable and Patient moving all extremities  Post vital signs: Reviewed and stable  Complications: No apparent anesthesia complications

## 2014-02-07 NOTE — Interval H&P Note (Signed)
History and Physical Interval Note:  02/07/2014 7:24 AM  Sandy Roy  has presented today for surgery, with the diagnosis of End Stage Renal Disease N18.6  The various methods of treatment have been discussed with the patient and family. After consideration of risks, benefits and other options for treatment, the patient has consented to  Procedure(s): FIRST STAGE BASILIC VEIN TRANSPOSITION (Right) as a surgical intervention .  The patient's history has been reviewed, patient examined, no change in status, stable for surgery.  I have reviewed the patient's chart and labs.  Questions were answered to the patient's satisfaction.     Najma Bozarth LIANG-YU

## 2014-02-07 NOTE — Anesthesia Postprocedure Evaluation (Signed)
  Anesthesia Post-op Note  Patient: Sandy Roy  Procedure(s) Performed: Procedure(s): FIRST STAGE BASILIC VEIN TRANSPOSITION (Right)  Patient Location: PACU  Anesthesia Type:General  Level of Consciousness: awake, alert  and oriented  Airway and Oxygen Therapy: Patient Spontanous Breathing  Post-op Pain: mild  Post-op Assessment: Post-op Vital signs reviewed, Patient's Cardiovascular Status Stable, Respiratory Function Stable, Patent Airway and No signs of Nausea or vomiting  Post-op Vital Signs: stable  Last Vitals:  Filed Vitals:   02/07/14 1107  BP:   Pulse:   Temp: 36.1 C  Resp: 15    Complications: No apparent anesthesia complications

## 2014-02-07 NOTE — Progress Notes (Signed)
M. Collins PA aware no bruit or thrill in RUE.

## 2014-02-07 NOTE — Anesthesia Postprocedure Evaluation (Signed)
  Anesthesia Post-op Note  Patient: Sandy Roy  Procedure(s) Performed: Procedure(s): FIRST STAGE BASILIC VEIN TRANSPOSITION (Right)  Patient Location: PACU  Anesthesia Type:General  Level of Consciousness: awake, alert  and oriented  Airway and Oxygen Therapy: Patient Spontanous Breathing and Patient connected to nasal cannula oxygen  Post-op Pain: mild  Post-op Assessment: Post-op Vital signs reviewed, Patient's Cardiovascular Status Stable, Respiratory Function Stable, Patent Airway, No signs of Nausea or vomiting and Pain level controlled  Post-op Vital Signs: stable  Last Vitals:  Filed Vitals:   02/07/14 1107  BP:   Pulse:   Temp: 36.1 C  Resp: 15    Complications: No apparent anesthesia complications

## 2014-02-07 NOTE — Anesthesia Preprocedure Evaluation (Signed)
Anesthesia Evaluation  Patient identified by MRN, date of birth, ID band  Reviewed: Allergy & Precautions, H&P , NPO status , Patient's Chart, lab work & pertinent test results  Airway Mallampati: II  TM Distance: >3 FB Neck ROM: Full    Dental  (+) Teeth Intact   Pulmonary former smoker,  breath sounds clear to auscultation        Cardiovascular hypertension, Rhythm:Regular Rate:Normal     Neuro/Psych    GI/Hepatic   Endo/Other  diabetes  Renal/GU      Musculoskeletal   Abdominal (+) + obese,   Peds  Hematology   Anesthesia Other Findings   Reproductive/Obstetrics                             Anesthesia Physical Anesthesia Plan  ASA: III  Anesthesia Plan: General   Post-op Pain Management:    Induction: Intravenous  Airway Management Planned: LMA  Additional Equipment:   Intra-op Plan:   Post-operative Plan:   Informed Consent: I have reviewed the patients History and Physical, chart, labs and discussed the procedure including the risks, benefits and alternatives for the proposed anesthesia with the patient or authorized representative who has indicated his/her understanding and acceptance.   Dental advisory given  Plan Discussed with: CRNA and Anesthesiologist  Anesthesia Plan Comments: (ESRD has not started HD K-3.7 Type 2 DM glucose 154 Hypertension Sleep apnea   Plan GA with LMA  Sandy Roy)        Anesthesia Quick Evaluation

## 2014-02-07 NOTE — Discharge Instructions (Signed)
What to eat: ° °For your first meals, you should eat lightly; only small meals initially.  If you do not have nausea, you may eat larger meals.  Avoid spicy, greasy and heavy food.   ° °General Anesthesia, Adult, Care After  °Refer to this sheet in the next few weeks. These instructions provide you with information on caring for yourself after your procedure. Your health care provider may also give you more specific instructions. Your treatment has been planned according to current medical practices, but problems sometimes occur. Call your health care provider if you have any problems or questions after your procedure.  °WHAT TO EXPECT AFTER THE PROCEDURE  °After the procedure, it is typical to experience:  °Sleepiness.  °Nausea and vomiting. °HOME CARE INSTRUCTIONS  °For the first 24 hours after general anesthesia:  °Have a responsible person with you.  °Do not drive a car. If you are alone, do not take public transportation.  °Do not drink alcohol.  °Do not take medicine that has not been prescribed by your health care provider.  °Do not sign important papers or make important decisions.  °You may resume a normal diet and activities as directed by your health care provider.  °Change bandages (dressings) as directed.  °If you have questions or problems that seem related to general anesthesia, call the hospital and ask for the anesthetist or anesthesiologist on call. °SEEK MEDICAL CARE IF:  °You have nausea and vomiting that continue the day after anesthesia.  °You develop a rash. °SEEK IMMEDIATE MEDICAL CARE IF:  °You have difficulty breathing.  °You have chest pain.  °You have any allergic problems. °Document Released: 06/02/2000 Document Revised: 10/27/2012 Document Reviewed: 09/09/2012  °ExitCare® Patient Information ©2014 [employer].  ° °Tissue Adhesive Wound Care  ° ° °Some cuts and wounds can be closed with tissue adhesive. Adhesive is like glue. It holds the skin together and helps a wound heal faster.  This adhesive goes away on its own as the wound heals.  °HOME CARE  °Showers are allowed. Do not soak the wound in water. Do not take baths, swim, or use hot tubs. Do not use soaps or creams on your wound.  °If a bandage (dressing) was put on, change it as often as told by your doctor.  °Keep the bandage dry.  °Do not scratch, pick, or rub the adhesive.  °Do not put tape over the adhesive. The adhesive could come off.  °Protect the wound from another injury.  °Protect the wound from sun and tanning beds.  °Only take medicine as told by your doctor.  °Keep all doctor visits as told. °GET HELP RIGHT AWAY IF:  °Your wound is red, puffy (swollen), hot, or tender.  °You get a rash after the glue is put on.  °You have more pain in the wound.  °You have a red streak going away from the wound.  °You have yellowish-white fluid (pus) coming from the wound.  °You have more bleeding.  °You have a fever.  °You have chills and start to shake.  °You notice a bad smell coming from the wound.  °Your wound or adhesive breaks open. °MAKE SURE YOU:  °Understand these instructions.  °Will watch your condition.  °Will get help right away if you are not doing well or get worse. °Document Released: 12/04/2007 Document Revised: 12/15/2012 Document Reviewed: 09/15/2012  °ExitCare® Patient Information ©2015 [employer]. This information is not intended to replace advice given to you by your health care provider.   Make sure you discuss any questions you have with your health care provider.  ° ° °

## 2014-02-07 NOTE — H&P (View-Only) (Signed)
Referred by:  Gara Kroner, MD [address], [place], [place] [address]  Reason for referral: New access  History of Present Illness  Sandy Roy is a 63 y.o. (1951/02/13) female who presents for evaluation for permanent access.  The patient is right hand dominant.  The patient has not had previous access procedures.  Previous central venous cannulation procedures include: none.  The patient has never had a PPM placed.   Past Medical History  Diagnosis Date  . CONGESTIVE HEART FAILURE 12/14/2006  . CORONARY ARTERY DISEASE 12/14/2006  . DIABETES MELLITUS, TYPE II 12/14/2006  . HYPERLIPIDEMIA 12/14/2006  . HYPERTENSION 12/14/2006  . Hypoparathyroidism 12/21/2006  . Hypopotassemia 01/13/2007  . HYPOTHYROIDISM, POSTSURGICAL 12/15/2006  . NEOPLASM, MALIGNANT, THYROID GLAND 12/15/2006    Stage 1 papillary adenocarcinoma, one very small focus each lobe (75mm, 64mm)  6/09: CT neck: no evidence of recurrence  . OBSTRUCTIVE SLEEP APNEA 02/23/2007  . Chronic kidney disease     Past Surgical History  Procedure Laterality Date  . Thyroidectomy  2008  . Cholecystectomy      History   Social History  . Marital Status: Widowed    Spouse Name: N/A    Number of Children: N/A  . Years of Education: N/A   Occupational History  . Not on file.   Social History Main Topics  . Smoking status: Former Research scientist (life sciences)  . Smokeless tobacco: Never Used  . Alcohol Use: No  . Drug Use: No  . Sexual Activity: Not on file   Other Topics Concern  . Not on file   Social History Narrative    Family History  Problem Relation Age of Onset  . Cancer Other   . COPD Other   . Hypertension Other   . Hyperlipidemia Other   . Stroke Other   . Heart disease Other   . Diabetes Other   . Heart failure Mother     Current Outpatient Prescriptions on File Prior to Visit  Medication Sig Dispense Refill  . amLODipine (NORVASC) 10 MG tablet Take 10 mg by mouth daily.    Marland Kitchen aspirin (BAYER LOW  STRENGTH) 81 MG EC tablet Take 81 mg by mouth daily.      Marland Kitchen atorvastatin (LIPITOR) 40 MG tablet Take 1 tablet (40 mg total) by mouth daily. 30 tablet 6  . cloNIDine (CATAPRES) 0.2 MG tablet Take 0.2 mg by mouth 3 (three) times daily.     Marland Kitchen doxazosin (CARDURA) 2 MG tablet Take 2 mg by mouth daily.    . furosemide (LASIX) 80 MG tablet Take 1 tablet (80 mg total) by mouth 2 (two) times daily. 60 tablet 0  . glucose blood (ONETOUCH VERIO) test strip 1 each by Other route 2 (two) times daily. And lancets 2/day 250.01 60 each 12  . insulin NPH-regular Human (NOVOLIN 70/30) (70-30) 100 UNIT/ML injection Inject 50 Units into the skin daily with breakfast. 20 mL 11  . Insulin Syringe-Needle U-100 (B-D INSULIN SYRINGE) 31G X 5/16" 0.3 ML MISC Use 1 time per day. 100 each 1  . levothyroxine (SYNTHROID, LEVOTHROID) 100 MCG tablet Take 1 tablet (100 mcg total) by mouth daily before breakfast. 30 tablet 2  . Multiple Vitamins-Minerals (MULTIVITAMIN & MINERAL PO) Take 1 tablet by mouth daily.    . nitroGLYCERIN (NITROSTAT) 0.4 MG SL tablet Place 1 tablet (0.4 mg total) under the tongue every 5 (five) minutes as needed. 25 tablet 5  . triazolam (HALCION) 0.25 MG tablet  2 tablets by mouth at bedtime     . fish oil-omega-3 fatty acids 1000 MG capsule Take 1 capsule by mouth daily.       No current facility-administered medications on file prior to visit.    No Known Allergies   REVIEW OF SYSTEMS:  (Positives checked otherwise negative)  CARDIOVASCULAR:  []  chest pain, []  chest pressure, []  palpitations, [x]  shortness of breath when laying flat, []  shortness of breath with exertion,  []  pain in feet when walking, []  pain in feet when laying flat, []  history of blood clot in veins (DVT), []  history of phlebitis, [x]  swelling in legs, []  varicose veins  PULMONARY:  []  productive cough, []  asthma, []  wheezing  NEUROLOGIC:  [x]  weakness in arms or legs, []  numbness in arms or legs, []  difficulty speaking or  slurred speech, []  temporary loss of vision in one eye, []  dizziness  HEMATOLOGIC:  []  bleeding problems, []  problems with blood clotting too easily  MUSCULOSKEL:  []  joint pain, []  joint swelling  GASTROINTEST:  []  vomiting blood, []  blood in stool     GENITOURINARY:  []  burning with urination, []  blood in urine  PSYCHIATRIC:  []  history of major depression  INTEGUMENTARY:  []  rashes, []  ulcers  CONSTITUTIONAL:  []  fever, [x]  chills  Physical Examination  Filed Vitals:   01/27/14 1628  BP: 165/88  Pulse: 86  Resp: 18  Height: 5\' 3"  (1.6 m)  Weight: 194 lb (87.998 kg)   Body mass index is 34.37 kg/(m^2).  General: A&O x 3, WDWN  Head: Bell/AT  Ear/Nose/Throat: Hearing grossly intact, nares w/o erythema or drainage, oropharynx w/o Erythema/Exudate, Mallampati score: 3  Eyes: PERRLA, EOMI  Neck: Supple, no nuchal rigidity, no palpable LAD  Pulmonary: Sym exp, good air movt, CTAB, no rales, rhonchi, & wheezing  Cardiac: RRR, Nl S1, S2, no Murmurs, rubs or gallops  Vascular: Vessel Right Left  Radial Palpable Palpable  Ulnar Palpable Palpable  Brachial Palpable Palpable  Carotid Palpable, without bruit Palpable, without bruit  Aorta Not palpable N/A  Femoral Palpable Palpable  Popliteal Not palpable Not palpable  PT Not Palpable Not Palpable  DP Not Palpable Not Palpable   Gastrointestinal: soft, NTND, -G/R, - HSM, - masses, - CVAT B  Musculoskeletal: M/S 5/5 throughout , Extremities without ischemic changes   Neurologic: CN 2-12 intact , Pain and light touch intact in extremities , Motor exam as listed above  Psychiatric: Judgment intact, Depress mood & affect, appropriate for pt's clinical situation  Dermatologic: See M/S exam for extremity exam, no rashes otherwise noted  Lymph : No Cervical, Axillary, or Inguinal lymphadenopathy   Non-Invasive Vascular Imaging  Vein Mapping  (Date: 01/27/2014):   R arm: acceptable vein conduits include marginal R  basilic vein    L arm: acceptable vein conduits include none  BUE Doppler (Date: 01/27/2014):   R arm:   Brachial: 4.0 mm, tri  Radial: 2.1 mm, tri  Ulnar: NV  L arm:   Brachial: 4.5 mm, tri  Radial: 2.0 mm, tri  Ulnar: 1.2 mm, mono  Outside Studies/Documentation 10 pages of outside documents were reviewed including: outpatient nephrology chart.  Medical Decision Making  Sandy Roy is a 63 y.o. female who presents with ESRD requiring hemodialysis.   Based on vein mapping and examination, this patient's permanent access options include: R stage BVT  I had an extensive discussion with this patient in regards to the nature of access surgery, including risk, benefits, and alternatives.  The patient is aware that the risks of access surgery include but are not limited to: bleeding, infection, steal syndrome, nerve damage, ischemic monomelic neuropathy, failure of access to mature, and possible need for additional access procedures in the future. I discussed with the patient the nature of the staged access procedure, specifically the need for a second operation to transpose the first stage fistula if it matures adequately.    The patient has agreed to proceed with the above procedure which will be scheduled 1 DEC 15.  Adele Barthel, MD [facility] Office: [phone] Pager: [phone]  01/27/2014, 5:17 PM

## 2014-02-08 ENCOUNTER — Encounter (HOSPITAL_COMMUNITY): Payer: Self-pay | Admitting: Vascular Surgery

## 2014-02-09 ENCOUNTER — Telehealth: Payer: Self-pay | Admitting: Vascular Surgery

## 2014-02-09 NOTE — Telephone Encounter (Signed)
Spoke with pt and mailed letter dpm

## 2014-02-09 NOTE — Telephone Encounter (Signed)
-----   Message from Mena Goes, RN sent at 02/07/2014 11:36 AM EST ----- Regarding: Schedule   ----- Message -----    From: Ulyses Amor, PA-C    Sent: 02/07/2014  10:30 AM      To: [facility] Charge Pool  F/U with Dr. Bridgett Larsson in 4-6 weeks s/p right AV fistula no studies.

## 2014-02-23 DIAGNOSIS — N185 Chronic kidney disease, stage 5: Secondary | ICD-10-CM | POA: Diagnosis not present

## 2014-02-23 DIAGNOSIS — I770 Arteriovenous fistula, acquired: Secondary | ICD-10-CM | POA: Diagnosis not present

## 2014-03-13 ENCOUNTER — Other Ambulatory Visit: Payer: Medicare Other

## 2014-03-13 DIAGNOSIS — N185 Chronic kidney disease, stage 5: Secondary | ICD-10-CM | POA: Diagnosis not present

## 2014-03-16 ENCOUNTER — Encounter: Payer: Self-pay | Admitting: Vascular Surgery

## 2014-03-17 ENCOUNTER — Ambulatory Visit (INDEPENDENT_AMBULATORY_CARE_PROVIDER_SITE_OTHER): Payer: Self-pay | Admitting: Vascular Surgery

## 2014-03-17 ENCOUNTER — Telehealth: Payer: Self-pay | Admitting: Interventional Cardiology

## 2014-03-17 ENCOUNTER — Encounter: Payer: Self-pay | Admitting: Interventional Cardiology

## 2014-03-17 ENCOUNTER — Encounter: Payer: Self-pay | Admitting: Vascular Surgery

## 2014-03-17 VITALS — BP 125/78 | HR 72 | Ht 63.0 in | Wt 205.3 lb

## 2014-03-17 DIAGNOSIS — E1122 Type 2 diabetes mellitus with diabetic chronic kidney disease: Secondary | ICD-10-CM

## 2014-03-17 DIAGNOSIS — N184 Chronic kidney disease, stage 4 (severe): Secondary | ICD-10-CM

## 2014-03-17 NOTE — Telephone Encounter (Signed)
Called Dr. Abel Presto office and they will send a copy of her labs from Monday. I have left a message for the patient that I will be back in touch with her once I have these.

## 2014-03-17 NOTE — Progress Notes (Signed)
    Postoperative Access Visit   History of Present Illness  Sandy Roy is a 64 y.o. year old female who presents for postoperative follow-up for: 1st stage basilic vein transposition  (Date: 02/07/14).  The patient's wounds are healed.  The patient notes no steal symptoms.  The patient is able to complete their activities of daily living.  The patient's current symptoms are: mild intermittent tingling of her fingers. The patient states that this resolves quickly.   For VQI Use Only  PRE-ADM LIVING: Home  AMB STATUS: Ambulatory  Physical Examination Filed Vitals:   03/17/14 1403  BP: 125/78  Pulse: 72    RUE: Incision is healed, skin feels warm and dry, hand grip is 5/5, sensation in digits is  intact, palpable thrill, bruit can be auscultated   Medical Decision Making  Sandy Roy is a 65 y.o. year old female who presents s/p right 1st stage basilic vein transposition (brachial-basilic arteriovenous fistula) (Date: 02/07/14). She is not yet on dialysis.  The patient's access is maturing well. On SonoSite exam, her basilic vein diameters measure between 0.55 cm- 0.6 cm.   She will follow up in one month to further evaluate her fistula. She will require a second stage basilic vein transposition when her fistula is mature.   Thank you for allowing [place] to participate in this patient's care.  Virgina Jock, PA-C [facility] Office: [phone]  03/17/2014, 2:44 PM   This patient was seen and examined in conjunction with Dr. Bridgett Larsson.   Addendum  I have independently interviewed and examined the patient, and I agree with the physician assistant's findings.  Some segments of the fistula appear to be a bit too small so will wait another month.  I suspect it will be ready at that time.  Adele Barthel, MD [facility] Office: [phone] Pager: [phone]  03/17/2014, 5:28 PM

## 2014-03-17 NOTE — Telephone Encounter (Signed)
New Message  Pt went to [place] on [facility]s recommendation on Monday. Would like to know from Rn if this will suffice any labs that are required by our office. P{lease call back and discuss.

## 2014-03-20 NOTE — Telephone Encounter (Signed)
I spoke with the patient. She is aware that we received labs from Dr. Abel Presto office. She did have a liver panel done as part of her lab work, but no lipid panel. I have advised her that she can have this done when she goes to see Dr. Loanne Drilling, as he may additional labs on her that day and they can all be drawn at the same time.

## 2014-03-28 ENCOUNTER — Encounter: Payer: Self-pay | Admitting: Endocrinology

## 2014-03-28 ENCOUNTER — Ambulatory Visit (INDEPENDENT_AMBULATORY_CARE_PROVIDER_SITE_OTHER): Payer: Medicare Other | Admitting: Endocrinology

## 2014-03-28 VITALS — BP 134/88 | HR 87 | Temp 97.9°F | Ht 63.0 in | Wt 206.0 lb

## 2014-03-28 DIAGNOSIS — E1022 Type 1 diabetes mellitus with diabetic chronic kidney disease: Secondary | ICD-10-CM | POA: Diagnosis not present

## 2014-03-28 DIAGNOSIS — N189 Chronic kidney disease, unspecified: Secondary | ICD-10-CM

## 2014-03-28 LAB — LIPID PANEL
Cholesterol: 141 mg/dL (ref 0–200)
HDL: 47.3 mg/dL (ref >39.00)
LDL Cholesterol: 59 mg/dL (ref 0–99)
NonHDL: 93.7
Total CHOL/HDL Ratio: 3
Triglycerides: 172 mg/dL — ABNORMAL HIGH (ref 0.0–149.0)
VLDL: 34.4 mg/dL (ref 0.0–40.0)

## 2014-03-28 LAB — HEMOGLOBIN A1C: Hgb A1c MFr Bld: 8.2 % — ABNORMAL HIGH (ref 4.6–6.5)

## 2014-03-28 NOTE — Patient Instructions (Addendum)
check your blood sugar twice a day.  vary the time of day when you check, between before the 3 meals, and at bedtime.  also check if you have symptoms of your blood sugar being too high or too low.  please keep a record of the readings and bring it to your next appointment here.  You can write it on any piece of paper.  please call us sooner if your blood sugar goes below 70, or if you have a lot of readings over 200. Please come back for a follow-up appointment in 3 months.    On this type of insulin schedule, you should eat meals on a regular schedule.  If a meal is missed or significantly delayed, your blood sugar could go low.  blood tests are being requested for you today.  We'll contact you with results.

## 2014-03-28 NOTE — Progress Notes (Signed)
Subjective:    Patient ID: Sandy Roy, female    DOB: Mar 25, 1950, 64 y.o.   MRN: [id number]  HPI Pt returns for f/u of diabetes mellitus: DM type: insulin-requiring type 2 Dx'ed: [id number] Complications: CAD and renal failure.  Therapy: insulin since 2015.   GDM: never.   DKA: never Severe hypoglycemia: never.   Pancreatitis: never.   Other info: she wants an inexpensive and simple insulin regimen.  Interval history: no cbg record, but states cbg's are well-controlled in general.  It is still mildly low when a meal is missed or delayed.   Past Medical History  Diagnosis Date  . CONGESTIVE HEART FAILURE 12/14/2006  . CORONARY ARTERY DISEASE 12/14/2006  . HYPERLIPIDEMIA 12/14/2006  . HYPERTENSION 12/14/2006  . Hypoparathyroidism 12/21/2006  . Hypopotassemia 01/13/2007  . HYPOTHYROIDISM, POSTSURGICAL 12/15/2006  . NEOPLASM, MALIGNANT, THYROID GLAND 12/15/2006    Stage 1 papillary adenocarcinoma, one very small focus each lobe (84mm, 17mm)  6/09: CT neck: no evidence of recurrence  . Chronic kidney disease   . OBSTRUCTIVE SLEEP APNEA 02/23/2007    does not use a cpap  . DIABETES MELLITUS, TYPE II 12/14/2006    Type 2  . Depression     not recently  . Orbital fracture     left eye    Past Surgical History  Procedure Laterality Date  . Thyroidectomy  2008  . Cholecystectomy    . Tubal ligation    . Ovary surgery    . Tonsillectomy    . Orbital fracture surgery    . Colonoscopy    . Breast surgery Left     nipple leaking, had surgery to fix the leak  . Cardiac catheterization      ? 10 years ago  . Bascilic vein transposition Right 02/07/2014    Procedure: FIRST STAGE BASILIC VEIN TRANSPOSITION;  Surgeon: Conrad Atkinson, MD;  Location: [facility];  Service: Vascular;  Laterality: Right;    History   Social History  . Marital Status: Widowed    Spouse Name: N/A    Number of Children: N/A  . Years of Education: N/A   Occupational History  . Not on file.   Social History Main  Topics  . Smoking status: Former Smoker    Types: Cigarettes  . Smokeless tobacco: Never Used  . Alcohol Use: No  . Drug Use: No  . Sexual Activity: Not on file   Other Topics Concern  . Not on file   Social History Narrative    Current Outpatient Prescriptions on File Prior to Visit  Medication Sig Dispense Refill  . amLODipine (NORVASC) 10 MG tablet Take 10 mg by mouth daily.    Marland Kitchen aspirin (BAYER LOW STRENGTH) 81 MG EC tablet Take 81 mg by mouth daily.      Marland Kitchen atorvastatin (LIPITOR) 20 MG tablet     . atorvastatin (LIPITOR) 40 MG tablet Take 1 tablet (40 mg total) by mouth daily. 30 tablet 6  . cloNIDine (CATAPRES) 0.2 MG tablet Take 0.2 mg by mouth 3 (three) times daily.     Marland Kitchen doxazosin (CARDURA) 2 MG tablet Take 2 mg by mouth daily.    . fish oil-omega-3 fatty acids 1000 MG capsule Take 1 capsule by mouth daily.      . furosemide (LASIX) 40 MG tablet     . furosemide (LASIX) 80 MG tablet Take 1 tablet (80 mg total) by mouth 2 (two) times daily. 60 tablet 0  . glucose blood (  ONETOUCH VERIO) test strip 1 each by Other route 2 (two) times daily. And lancets 2/day 250.01 60 each 12  . insulin NPH-regular Human (NOVOLIN 70/30) (70-30) 100 UNIT/ML injection Inject 50 Units into the skin daily with breakfast. 20 mL 11  . Insulin Syringe-Needle U-100 (B-D INSULIN SYRINGE) 31G X 5/16" 0.3 ML MISC Use 1 time per day. 100 each 1  . levothyroxine (SYNTHROID, LEVOTHROID) 100 MCG tablet Take 1 tablet (100 mcg total) by mouth daily before breakfast. 30 tablet 2  . Multiple Vitamins-Minerals (MULTIVITAMIN & MINERAL PO) Take 1 tablet by mouth daily.    . nitroGLYCERIN (NITROSTAT) 0.4 MG SL tablet Place 1 tablet (0.4 mg total) under the tongue every 5 (five) minutes as needed. 25 tablet 5  . oxyCODONE-acetaminophen (PERCOCET/ROXICET) 5-325 MG per tablet Take 1 tablet by mouth every 6 (six) hours as needed. 30 tablet 0  . predniSONE (DELTASONE) 10 MG tablet     . triazolam (HALCION) 0.25 MG tablet 2  tablets by mouth at bedtime     . calcitRIOL (ROCALTROL) 0.5 MCG capsule      No current facility-administered medications on file prior to visit.    No Known Allergies  Family History  Problem Relation Age of Onset  . Cancer Other   . COPD Other   . Hypertension Other   . Hyperlipidemia Other   . Stroke Other   . Heart disease Other   . Diabetes Other   . Heart failure Mother   . Diabetes Mother   . Cancer Father     BP 134/88 mmHg  Pulse 87  Temp(Src) 97.9 F (36.6 C) (Oral)  Ht 5\' 3"  (1.6 m)  Wt 206 lb (93.441 kg)  BMI 36.50 kg/m2  SpO2 96%    Review of Systems She denies LOC and weight change    Objective:   Physical Exam VITAL SIGNS:  See vs page GENERAL: no distress Pulses: dorsalis pedis intact bilat.   MSK: no deformity of the feet CV: trace bilat leg edema.  Skin:  no ulcer on the feet.  normal color and temp on the feet. Neuro: sensation is intact to touch on the feet   Lab Results  Component Value Date   CHOL 141 03/28/2014   HDL 47.30 03/28/2014   LDLCALC 59 03/28/2014   LDLDIRECT 164 03/18/2010   TRIG 172.0* 03/28/2014   CHOLHDL 3 03/28/2014   Lab Results  Component Value Date   HGBA1C 8.2* 03/28/2014       Assessment & Plan:  DM: moderate exacerbation Dyslipidemia: well-controlled Side-effect of rx: mild hypoglycemia, when a meal is delayed   Patient is advised the following: Patient Instructions  check your blood sugar twice a day.  vary the time of day when you check, between before the 3 meals, and at bedtime.  also check if you have symptoms of your blood sugar being too high or too low.  please keep a record of the readings and bring it to your next appointment here.  You can write it on any piece of paper.  please call us sooner if your blood sugar goes below 70, or if you have a lot of readings over 200. Please come back for a follow-up appointment in 3 months.    On this type of insulin schedule, you should eat meals on a  regular schedule.  If a meal is missed or significantly delayed, your blood sugar could go low.  blood tests are being requested for you today.  We'll contact you with results.     increase insulin to 55 units qam.

## 2014-03-29 ENCOUNTER — Telehealth: Payer: Self-pay | Admitting: Endocrinology

## 2014-03-29 NOTE — Telephone Encounter (Signed)
Medication added to pt's medication list.

## 2014-03-29 NOTE — Telephone Encounter (Signed)
Patient states that she was advised to call Dr. Loanne Drilling and was to give her medication name and dosage   Renzela 800 Mg 2 tabs 3 times daily with food    Thank You

## 2014-04-17 DIAGNOSIS — M791 Myalgia: Secondary | ICD-10-CM | POA: Diagnosis not present

## 2014-04-17 DIAGNOSIS — E782 Mixed hyperlipidemia: Secondary | ICD-10-CM | POA: Diagnosis not present

## 2014-04-17 DIAGNOSIS — N184 Chronic kidney disease, stage 4 (severe): Secondary | ICD-10-CM | POA: Diagnosis not present

## 2014-04-17 DIAGNOSIS — E039 Hypothyroidism, unspecified: Secondary | ICD-10-CM | POA: Diagnosis not present

## 2014-04-18 ENCOUNTER — Telehealth: Payer: Self-pay | Admitting: Endocrinology

## 2014-04-18 MED ORDER — LEVOTHYROXINE SODIUM 112 MCG PO TABS
112.0000 ug | ORAL_TABLET | Freq: Every day | ORAL | Status: DC
Start: 2014-04-18 — End: 2014-10-27

## 2014-04-18 NOTE — Telephone Encounter (Signed)
Lvom advising of note below. Requested call back if pt would like to discuss.  

## 2014-04-18 NOTE — Telephone Encounter (Signed)
please call patient: i received the thyroid result from Dr Moreen Fowler: Please slightly increase the thyroid pill.  i have sent a prescription to your pharmacy. i'll see you next time, when we'll recheck the blood test.

## 2014-04-20 ENCOUNTER — Encounter: Payer: Self-pay | Admitting: Vascular Surgery

## 2014-04-21 ENCOUNTER — Encounter: Payer: Self-pay | Admitting: Vascular Surgery

## 2014-04-21 ENCOUNTER — Ambulatory Visit (INDEPENDENT_AMBULATORY_CARE_PROVIDER_SITE_OTHER): Payer: Self-pay | Admitting: Vascular Surgery

## 2014-04-21 VITALS — BP 180/87 | HR 99 | Ht 63.0 in | Wt 195.0 lb

## 2014-04-21 DIAGNOSIS — N184 Chronic kidney disease, stage 4 (severe): Secondary | ICD-10-CM

## 2014-04-21 DIAGNOSIS — E1122 Type 2 diabetes mellitus with diabetic chronic kidney disease: Secondary | ICD-10-CM

## 2014-04-21 NOTE — Progress Notes (Signed)
    Postoperative Access Visit   History of Present Illness  Sandy Roy is a 64 y.o. year old female who presents for postoperative follow-up for: R 1st BVT (Date: 02/07/14).  The patient's wounds are healed.  The patient notes no steal symptoms.  The patient is able to complete their activities of daily living.  The patient's current symptoms are: none.  For VQI Use Only  PRE-ADM LIVING: Home  AMB STATUS: Ambulatory  Physical Examination Filed Vitals:   04/21/14 0918  BP: 180/87  Pulse: 99    RUE: Incision is healed, skin feels warm, hand grip is 5/5, sensation in digits is intact, palpable thrill, bruit can be auscultated , On Sonosite: fistula 5.0 mm distally, >6.0 mm proximally  Medical Decision Making  Sandy Roy is a 64 y.o. year old female who presents s/p R 1st BVT.  Suspect fistula will be ready for transposition in one month.  She will follow up for recheck at that time.    Pt has some sx of URI today, so I doubt she would be ready to go to the OR regardless in the next two weeks.   Thank you for allowing [place] to participate in this patient's care.  Adele Barthel, MD [facility] Office: [phone] Pager: [phone]  04/21/2014, 9:39 AM

## 2014-04-24 DIAGNOSIS — N185 Chronic kidney disease, stage 5: Secondary | ICD-10-CM | POA: Diagnosis not present

## 2014-04-24 DIAGNOSIS — I770 Arteriovenous fistula, acquired: Secondary | ICD-10-CM | POA: Diagnosis not present

## 2014-04-27 DIAGNOSIS — M791 Myalgia: Secondary | ICD-10-CM | POA: Diagnosis not present

## 2014-05-01 DIAGNOSIS — N186 End stage renal disease: Secondary | ICD-10-CM | POA: Diagnosis not present

## 2014-05-01 DIAGNOSIS — T82858D Stenosis of vascular prosthetic devices, implants and grafts, subsequent encounter: Secondary | ICD-10-CM | POA: Diagnosis not present

## 2014-05-01 DIAGNOSIS — I871 Compression of vein: Secondary | ICD-10-CM | POA: Diagnosis not present

## 2014-05-01 DIAGNOSIS — Z992 Dependence on renal dialysis: Secondary | ICD-10-CM | POA: Diagnosis not present

## 2014-05-03 DIAGNOSIS — N186 End stage renal disease: Secondary | ICD-10-CM | POA: Diagnosis not present

## 2014-05-03 DIAGNOSIS — E1129 Type 2 diabetes mellitus with other diabetic kidney complication: Secondary | ICD-10-CM | POA: Diagnosis not present

## 2014-05-03 DIAGNOSIS — N2581 Secondary hyperparathyroidism of renal origin: Secondary | ICD-10-CM | POA: Diagnosis not present

## 2014-05-05 DIAGNOSIS — N2581 Secondary hyperparathyroidism of renal origin: Secondary | ICD-10-CM | POA: Diagnosis not present

## 2014-05-05 DIAGNOSIS — N186 End stage renal disease: Secondary | ICD-10-CM | POA: Diagnosis not present

## 2014-05-05 DIAGNOSIS — E1129 Type 2 diabetes mellitus with other diabetic kidney complication: Secondary | ICD-10-CM | POA: Diagnosis not present

## 2014-05-08 DIAGNOSIS — N186 End stage renal disease: Secondary | ICD-10-CM | POA: Diagnosis not present

## 2014-05-08 DIAGNOSIS — N2581 Secondary hyperparathyroidism of renal origin: Secondary | ICD-10-CM | POA: Diagnosis not present

## 2014-05-08 DIAGNOSIS — E1129 Type 2 diabetes mellitus with other diabetic kidney complication: Secondary | ICD-10-CM | POA: Diagnosis not present

## 2014-05-10 ENCOUNTER — Telehealth: Payer: Self-pay | Admitting: Endocrinology

## 2014-05-10 DIAGNOSIS — E1129 Type 2 diabetes mellitus with other diabetic kidney complication: Secondary | ICD-10-CM | POA: Diagnosis not present

## 2014-05-10 DIAGNOSIS — D631 Anemia in chronic kidney disease: Secondary | ICD-10-CM | POA: Diagnosis not present

## 2014-05-10 DIAGNOSIS — N186 End stage renal disease: Secondary | ICD-10-CM | POA: Diagnosis not present

## 2014-05-10 DIAGNOSIS — N2581 Secondary hyperparathyroidism of renal origin: Secondary | ICD-10-CM | POA: Diagnosis not present

## 2014-05-10 MED ORDER — GLUCOSE BLOOD VI STRP: 1.0000 | ORAL_STRIP | Freq: Two times a day (BID) | Status: DC

## 2014-05-10 NOTE — Telephone Encounter (Signed)
Patient called and would like her test strips sent to her pharmacy  Rx: One touch verio  Also Sandy Roy is currently on dialysis and has decreased her insulin to 35   Please advise    Thank you

## 2014-05-10 NOTE — Telephone Encounter (Signed)
Was already done today

## 2014-05-10 NOTE — Telephone Encounter (Signed)
Rx sent for one touch verio. See note below about insulin changes.  Thanks!

## 2014-05-11 ENCOUNTER — Encounter: Payer: Self-pay | Admitting: Vascular Surgery

## 2014-05-12 ENCOUNTER — Ambulatory Visit (INDEPENDENT_AMBULATORY_CARE_PROVIDER_SITE_OTHER): Payer: Self-pay | Admitting: Vascular Surgery

## 2014-05-12 ENCOUNTER — Encounter: Payer: Self-pay | Admitting: Vascular Surgery

## 2014-05-12 VITALS — BP 156/92 | HR 97 | Temp 98.6°F | Resp 16 | Ht 62.75 in | Wt 190.0 lb

## 2014-05-12 DIAGNOSIS — N2581 Secondary hyperparathyroidism of renal origin: Secondary | ICD-10-CM | POA: Diagnosis not present

## 2014-05-12 DIAGNOSIS — N186 End stage renal disease: Secondary | ICD-10-CM

## 2014-05-12 DIAGNOSIS — D631 Anemia in chronic kidney disease: Secondary | ICD-10-CM | POA: Diagnosis not present

## 2014-05-12 DIAGNOSIS — Z992 Dependence on renal dialysis: Secondary | ICD-10-CM

## 2014-05-12 DIAGNOSIS — E1129 Type 2 diabetes mellitus with other diabetic kidney complication: Secondary | ICD-10-CM | POA: Diagnosis not present

## 2014-05-12 NOTE — Addendum Note (Signed)
Addended by: Mena Goes on: 05/12/2014 03:09 PM   Modules accepted: Medications

## 2014-05-12 NOTE — Progress Notes (Addendum)
Postoperative Access Visit   History of Present Illness  Sandy Roy is a 64 y.o. year old female who presents for postoperative follow-up for: R 1st BVT (Date: 02/07/14).  The patient's wounds are healed.  The patient notes no steal symptoms.  The patient is able to complete their activities of daily living.  The patient's current symptoms are: none   Past Medical History  Diagnosis Date  . CONGESTIVE HEART FAILURE 12/14/2006  . CORONARY ARTERY DISEASE 12/14/2006  . HYPERLIPIDEMIA 12/14/2006  . HYPERTENSION 12/14/2006  . Hypoparathyroidism 12/21/2006  . Hypopotassemia 01/13/2007  . HYPOTHYROIDISM, POSTSURGICAL 12/15/2006  . NEOPLASM, MALIGNANT, THYROID GLAND 12/15/2006    Stage 1 papillary adenocarcinoma, one very small focus each lobe (22mm, 8mm)  6/09: CT neck: no evidence of recurrence  . Chronic kidney disease   . OBSTRUCTIVE SLEEP APNEA 02/23/2007    does not use a cpap  . DIABETES MELLITUS, TYPE II 12/14/2006    Type 2  . Depression     not recently  . Orbital fracture     left eye    Past Surgical History  Procedure Laterality Date  . Thyroidectomy  2008  . Cholecystectomy    . Tubal ligation    . Ovary surgery    . Tonsillectomy    . Orbital fracture surgery    . Colonoscopy    . Breast surgery Left     nipple leaking, had surgery to fix the leak  . Cardiac catheterization      ? 10 years ago  . Bascilic vein transposition Right 02/07/2014    Procedure: FIRST STAGE BASILIC VEIN TRANSPOSITION;  Surgeon: Conrad Otterbein, MD;  Location: [facility];  Service: Vascular;  Laterality: Right;    History   Social History  . Marital Status: Widowed    Spouse Name: N/A  . Number of Children: N/A  . Years of Education: N/A   Occupational History  . Not on file.   Social History Main Topics  . Smoking status: Former Smoker    Types: Cigarettes  . Smokeless tobacco: Never Used  . Alcohol Use: No  . Drug Use: No  . Sexual Activity: Not on file   Other Topics  Concern  . Not on file   Social History Narrative    Family History  Problem Relation Age of Onset  . Cancer Other   . COPD Other   . Hypertension Other   . Hyperlipidemia Other   . Stroke Other   . Heart disease Other   . Diabetes Other   . Heart failure Mother   . Diabetes Mother   . Cancer Father     Current Outpatient Prescriptions on File Prior to Visit  Medication Sig Dispense Refill  . amLODipine (NORVASC) 10 MG tablet Take 10 mg by mouth daily.    Marland Kitchen aspirin (BAYER LOW STRENGTH) 81 MG EC tablet Take 81 mg by mouth daily.      . calcitRIOL (ROCALTROL) 0.5 MCG capsule     . cloNIDine (CATAPRES) 0.2 MG tablet Take 0.2 mg by mouth 2 (two) times daily.     Marland Kitchen doxazosin (CARDURA) 2 MG tablet Take 2 mg by mouth daily.    . fish oil-omega-3 fatty acids 1000 MG capsule Take 1 capsule by mouth daily.      Marland Kitchen glucose blood (ONETOUCH VERIO) test strip 1 each by Other route 2 (two) times daily. And lancets 2/day E11.9 60 each 12  . insulin NPH-regular Human (  NOVOLIN 70/30) (70-30) 100 UNIT/ML injection Inject 50 Units into the skin daily with breakfast. (Patient taking differently: Inject 55 Units into the skin daily with breakfast. ) 20 mL 11  . Insulin Syringe-Needle U-100 (B-D INSULIN SYRINGE) 31G X 5/16" 0.3 ML MISC Use 1 time per day. 100 each 1  . levothyroxine (SYNTHROID, LEVOTHROID) 112 MCG tablet Take 1 tablet (112 mcg total) by mouth daily before breakfast. 30 tablet 5  . Multiple Vitamins-Minerals (MULTIVITAMIN & MINERAL PO) Take 1 tablet by mouth daily.    . nitroGLYCERIN (NITROSTAT) 0.4 MG SL tablet Place 1 tablet (0.4 mg total) under the tongue every 5 (five) minutes as needed. 25 tablet 5  . sevelamer carbonate (RENVELA) 800 MG tablet Take 800 mg by mouth 3 (three) times daily with meals.    . triazolam (HALCION) 0.25 MG tablet 2 tablets by mouth at bedtime     . atorvastatin (LIPITOR) 20 MG tablet     . atorvastatin (LIPITOR) 40 MG tablet Take 1 tablet (40 mg total) by  mouth daily. (Patient not taking: Reported on 05/12/2014) 30 tablet 6  . furosemide (LASIX) 40 MG tablet     . furosemide (LASIX) 80 MG tablet Take 1 tablet (80 mg total) by mouth 2 (two) times daily. (Patient not taking: Reported on 05/12/2014) 60 tablet 0  . oxyCODONE-acetaminophen (PERCOCET/ROXICET) 5-325 MG per tablet Take 1 tablet by mouth every 6 (six) hours as needed. (Patient not taking: Reported on 05/12/2014) 30 tablet 0  . predniSONE (DELTASONE) 10 MG tablet      No current facility-administered medications on file prior to visit.    No Known Allergies  REVIEW OF SYSTEMS:  (Positives checked otherwise negative)  CARDIOVASCULAR:  []  chest pain, []  chest pressure, []  palpitations, []  shortness of breath when laying flat, []  shortness of breath with exertion,  []  pain in feet when walking, []  pain in feet when laying flat, []  history of blood clot in veins (DVT), []  history of phlebitis, []  swelling in legs, []  varicose veins  PULMONARY:  []  productive cough, []  asthma, []  wheezing  NEUROLOGIC:  []  weakness in arms or legs, []  numbness in arms or legs, []  difficulty speaking or slurred speech, []  temporary loss of vision in one eye, []  dizziness  HEMATOLOGIC:  []  bleeding problems, []  problems with blood clotting too easily  MUSCULOSKEL:  []  joint pain, []  joint swelling  GASTROINTEST:  []  vomiting blood, []  blood in stool     GENITOURINARY:  []  burning with urination, []  blood in urine, [x]  ESRD-HD: M-W-F  PSYCHIATRIC:  []  history of major depression  INTEGUMENTARY:  []  rashes, []  ulcers  For VQI Use Only  PRE-ADM LIVING: Home  AMB STATUS: Ambulatory  Physical Examination Filed Vitals:   05/12/14 1501  BP: 156/92  Pulse: 97  Temp: 98.6 F (37 C)  Resp: 16   Pulmonary: Sym exp, good air movt, CTAB, no rales, rhonchi, & wheezing  Cardiac: RRR, Nl S1, S2, no Murmurs, rubs or gallops  RUE: Incision is healed, skin feels warm, hand grip is 5/5, sensation in digits is  intact, palpable thrill , bruit can be auscultated, On Sonosite: fistula > 6 mm throughout  Sandy Roy is a 64 y.o. year old female who presents s/p successful R 1st BVT   R 1st stage BVT is ready for transposition.    This is scheduled for 22 MAR 16.  Thank you for allowing [place] to participate in this  patient's care.  Adele Barthel, MD [facility] Office: [phone] Pager: [phone]  05/12/2014, 9:03 AM

## 2014-05-15 ENCOUNTER — Other Ambulatory Visit: Payer: Self-pay

## 2014-05-15 DIAGNOSIS — D631 Anemia in chronic kidney disease: Secondary | ICD-10-CM | POA: Diagnosis not present

## 2014-05-15 DIAGNOSIS — E1129 Type 2 diabetes mellitus with other diabetic kidney complication: Secondary | ICD-10-CM | POA: Diagnosis not present

## 2014-05-15 DIAGNOSIS — N186 End stage renal disease: Secondary | ICD-10-CM | POA: Diagnosis not present

## 2014-05-15 DIAGNOSIS — N2581 Secondary hyperparathyroidism of renal origin: Secondary | ICD-10-CM | POA: Diagnosis not present

## 2014-05-17 DIAGNOSIS — E1129 Type 2 diabetes mellitus with other diabetic kidney complication: Secondary | ICD-10-CM | POA: Diagnosis not present

## 2014-05-17 DIAGNOSIS — N186 End stage renal disease: Secondary | ICD-10-CM | POA: Diagnosis not present

## 2014-05-17 DIAGNOSIS — D631 Anemia in chronic kidney disease: Secondary | ICD-10-CM | POA: Diagnosis not present

## 2014-05-17 DIAGNOSIS — N2581 Secondary hyperparathyroidism of renal origin: Secondary | ICD-10-CM | POA: Diagnosis not present

## 2014-05-19 DIAGNOSIS — E1129 Type 2 diabetes mellitus with other diabetic kidney complication: Secondary | ICD-10-CM | POA: Diagnosis not present

## 2014-05-19 DIAGNOSIS — N186 End stage renal disease: Secondary | ICD-10-CM | POA: Diagnosis not present

## 2014-05-19 DIAGNOSIS — N2581 Secondary hyperparathyroidism of renal origin: Secondary | ICD-10-CM | POA: Diagnosis not present

## 2014-05-19 DIAGNOSIS — D631 Anemia in chronic kidney disease: Secondary | ICD-10-CM | POA: Diagnosis not present

## 2014-05-22 DIAGNOSIS — E1129 Type 2 diabetes mellitus with other diabetic kidney complication: Secondary | ICD-10-CM | POA: Diagnosis not present

## 2014-05-22 DIAGNOSIS — N2581 Secondary hyperparathyroidism of renal origin: Secondary | ICD-10-CM | POA: Diagnosis not present

## 2014-05-22 DIAGNOSIS — D631 Anemia in chronic kidney disease: Secondary | ICD-10-CM | POA: Diagnosis not present

## 2014-05-22 DIAGNOSIS — N186 End stage renal disease: Secondary | ICD-10-CM | POA: Diagnosis not present

## 2014-05-24 DIAGNOSIS — D631 Anemia in chronic kidney disease: Secondary | ICD-10-CM | POA: Diagnosis not present

## 2014-05-24 DIAGNOSIS — E1129 Type 2 diabetes mellitus with other diabetic kidney complication: Secondary | ICD-10-CM | POA: Diagnosis not present

## 2014-05-24 DIAGNOSIS — N186 End stage renal disease: Secondary | ICD-10-CM | POA: Diagnosis not present

## 2014-05-24 DIAGNOSIS — N2581 Secondary hyperparathyroidism of renal origin: Secondary | ICD-10-CM | POA: Diagnosis not present

## 2014-05-26 DIAGNOSIS — N186 End stage renal disease: Secondary | ICD-10-CM | POA: Diagnosis not present

## 2014-05-26 DIAGNOSIS — N2581 Secondary hyperparathyroidism of renal origin: Secondary | ICD-10-CM | POA: Diagnosis not present

## 2014-05-26 DIAGNOSIS — D631 Anemia in chronic kidney disease: Secondary | ICD-10-CM | POA: Diagnosis not present

## 2014-05-26 DIAGNOSIS — E1129 Type 2 diabetes mellitus with other diabetic kidney complication: Secondary | ICD-10-CM | POA: Diagnosis not present

## 2014-05-29 ENCOUNTER — Encounter (HOSPITAL_COMMUNITY): Payer: Self-pay | Admitting: *Deleted

## 2014-05-29 DIAGNOSIS — N186 End stage renal disease: Secondary | ICD-10-CM | POA: Diagnosis not present

## 2014-05-29 DIAGNOSIS — E1129 Type 2 diabetes mellitus with other diabetic kidney complication: Secondary | ICD-10-CM | POA: Diagnosis not present

## 2014-05-29 DIAGNOSIS — D631 Anemia in chronic kidney disease: Secondary | ICD-10-CM | POA: Diagnosis not present

## 2014-05-29 DIAGNOSIS — N2581 Secondary hyperparathyroidism of renal origin: Secondary | ICD-10-CM | POA: Diagnosis not present

## 2014-05-29 MED ORDER — DEXTROSE 5 % IV SOLN
1.5000 g | INTRAVENOUS | Status: AC
  Administered 2014-05-30: 1.5 g via INTRAVENOUS
  Filled 2014-05-29: qty 1.5

## 2014-05-29 MED ORDER — CHLORHEXIDINE GLUCONATE CLOTH 2 % EX PADS: 6.0000 | MEDICATED_PAD | Freq: Once | CUTANEOUS | Status: DC

## 2014-05-29 MED ORDER — SODIUM CHLORIDE 0.9 % IV SOLN: INTRAVENOUS | Status: DC

## 2014-05-29 NOTE — Progress Notes (Signed)
Patient wears contacts for 3 months, then replaces them.  I instructed patient that she will need to remove them tomorrow, prior to surgery.

## 2014-05-30 ENCOUNTER — Encounter (HOSPITAL_COMMUNITY)
Admission: RE | Disposition: A | Discharge status: Home / Self Care | Payer: Self-pay | Source: Ambulatory Visit | Attending: Vascular Surgery

## 2014-05-30 ENCOUNTER — Ambulatory Visit (HOSPITAL_COMMUNITY)
Admission: RE | Admit: 2014-05-30 | Discharge: 2014-05-30 | Disposition: A | Discharge status: Home / Self Care | Payer: Medicare Other | Source: Ambulatory Visit | Attending: Vascular Surgery | Admitting: Vascular Surgery

## 2014-05-30 ENCOUNTER — Ambulatory Visit (HOSPITAL_COMMUNITY): Payer: Medicare Other | Admitting: Certified Registered Nurse Anesthetist

## 2014-05-30 ENCOUNTER — Encounter (HOSPITAL_COMMUNITY): Payer: Self-pay | Admitting: Certified Registered Nurse Anesthetist

## 2014-05-30 DIAGNOSIS — G4733 Obstructive sleep apnea (adult) (pediatric): Secondary | ICD-10-CM | POA: Insufficient documentation

## 2014-05-30 DIAGNOSIS — I2510 Atherosclerotic heart disease of native coronary artery without angina pectoris: Secondary | ICD-10-CM | POA: Diagnosis not present

## 2014-05-30 DIAGNOSIS — I509 Heart failure, unspecified: Secondary | ICD-10-CM | POA: Diagnosis not present

## 2014-05-30 DIAGNOSIS — E119 Type 2 diabetes mellitus without complications: Secondary | ICD-10-CM | POA: Insufficient documentation

## 2014-05-30 DIAGNOSIS — Z794 Long term (current) use of insulin: Secondary | ICD-10-CM | POA: Insufficient documentation

## 2014-05-30 DIAGNOSIS — F329 Major depressive disorder, single episode, unspecified: Secondary | ICD-10-CM | POA: Diagnosis not present

## 2014-05-30 DIAGNOSIS — Z7982 Long term (current) use of aspirin: Secondary | ICD-10-CM | POA: Insufficient documentation

## 2014-05-30 DIAGNOSIS — N186 End stage renal disease: Secondary | ICD-10-CM | POA: Diagnosis not present

## 2014-05-30 DIAGNOSIS — I120 Hypertensive chronic kidney disease with stage 5 chronic kidney disease or end stage renal disease: Secondary | ICD-10-CM | POA: Diagnosis not present

## 2014-05-30 DIAGNOSIS — Z87891 Personal history of nicotine dependence: Secondary | ICD-10-CM | POA: Insufficient documentation

## 2014-05-30 DIAGNOSIS — E785 Hyperlipidemia, unspecified: Secondary | ICD-10-CM | POA: Diagnosis not present

## 2014-05-30 DIAGNOSIS — N185 Chronic kidney disease, stage 5: Secondary | ICD-10-CM | POA: Diagnosis not present

## 2014-05-30 DIAGNOSIS — Z6834 Body mass index (BMI) 34.0-34.9, adult: Secondary | ICD-10-CM | POA: Diagnosis not present

## 2014-05-30 DIAGNOSIS — Z85850 Personal history of malignant neoplasm of thyroid: Secondary | ICD-10-CM | POA: Diagnosis not present

## 2014-05-30 DIAGNOSIS — R0602 Shortness of breath: Secondary | ICD-10-CM | POA: Diagnosis not present

## 2014-05-30 DIAGNOSIS — Z992 Dependence on renal dialysis: Secondary | ICD-10-CM | POA: Insufficient documentation

## 2014-05-30 HISTORY — DX: Reserved for inherently not codable concepts without codable children: IMO0001

## 2014-05-30 HISTORY — PX: BASCILIC VEIN TRANSPOSITION: SHX5742

## 2014-05-30 LAB — GLUCOSE, CAPILLARY
Glucose-Capillary: 102 mg/dL — ABNORMAL HIGH (ref 70–99)
Glucose-Capillary: 102 mg/dL — ABNORMAL HIGH (ref 70–99)

## 2014-05-30 LAB — POCT I-STAT 4, (NA,K, GLUC, HGB,HCT)
GLUCOSE: 114 mg/dL — AB (ref 70–99)
HCT: 29 % — ABNORMAL LOW (ref 36.0–46.0)
HEMOGLOBIN: 9.9 g/dL — AB (ref 12.0–15.0)
POTASSIUM: 3.8 mmol/L (ref 3.5–5.1)
Sodium: 138 mmol/L (ref 135–145)

## 2014-05-30 SURGERY — TRANSPOSITION, VEIN, BASILIC
Anesthesia: General | Site: Arm Upper | Laterality: Right

## 2014-05-30 MED ORDER — OXYCODONE HCL 5 MG/5ML PO SOLN: 5.0000 mg | Freq: Once | ORAL | Status: AC | PRN

## 2014-05-30 MED ORDER — PROPOFOL 10 MG/ML IV BOLUS
INTRAVENOUS | Status: AC
  Filled 2014-05-30: qty 20

## 2014-05-30 MED ORDER — LIDOCAINE HCL (CARDIAC) 20 MG/ML IV SOLN
INTRAVENOUS | Status: AC
  Filled 2014-05-30: qty 5

## 2014-05-30 MED ORDER — OXYCODONE HCL 5 MG PO TABS
5.0000 mg | ORAL_TABLET | Freq: Once | ORAL | Status: AC | PRN
  Administered 2014-05-30: 5 mg via ORAL

## 2014-05-30 MED ORDER — ONDANSETRON HCL 4 MG/2ML IJ SOLN: 4.0000 mg | Freq: Four times a day (QID) | INTRAMUSCULAR | Status: DC | PRN

## 2014-05-30 MED ORDER — SODIUM CHLORIDE 0.9 % IV SOLN
INTRAVENOUS | Status: DC | PRN
  Administered 2014-05-30: 09:00:00 via INTRAVENOUS

## 2014-05-30 MED ORDER — FENTANYL CITRATE 0.05 MG/ML IJ SOLN
25.0000 ug | INTRAMUSCULAR | Status: DC | PRN
  Administered 2014-05-30 (×2): 50 ug via INTRAVENOUS

## 2014-05-30 MED ORDER — 0.9 % SODIUM CHLORIDE (POUR BTL) OPTIME
TOPICAL | Status: DC | PRN
  Administered 2014-05-30: 1000 mL

## 2014-05-30 MED ORDER — SODIUM CHLORIDE 0.9 % IV SOLN
INTRAVENOUS | Status: DC
  Administered 2014-05-30: 09:00:00 via INTRAVENOUS

## 2014-05-30 MED ORDER — OXYCODONE-ACETAMINOPHEN 5-325 MG PO TABS: 1.0000 | ORAL_TABLET | Freq: Four times a day (QID) | ORAL | Status: DC | PRN

## 2014-05-30 MED ORDER — ONDANSETRON HCL 4 MG/2ML IJ SOLN
INTRAMUSCULAR | Status: DC | PRN
  Administered 2014-05-30: 4 mg via INTRAVENOUS

## 2014-05-30 MED ORDER — THROMBIN 20000 UNITS EX SOLR
CUTANEOUS | Status: DC | PRN
  Administered 2014-05-30 (×2): 10 mL via TOPICAL

## 2014-05-30 MED ORDER — PROPOFOL 10 MG/ML IV BOLUS
INTRAVENOUS | Status: DC | PRN
  Administered 2014-05-30: 130 mg via INTRAVENOUS

## 2014-05-30 MED ORDER — ONDANSETRON HCL 4 MG/2ML IJ SOLN
INTRAMUSCULAR | Status: AC
  Filled 2014-05-30: qty 2

## 2014-05-30 MED ORDER — OXYCODONE HCL 5 MG PO TABS
ORAL_TABLET | ORAL | Status: AC
  Filled 2014-05-30: qty 1

## 2014-05-30 MED ORDER — FENTANYL CITRATE 0.05 MG/ML IJ SOLN
INTRAMUSCULAR | Status: AC
  Filled 2014-05-30: qty 5

## 2014-05-30 MED ORDER — LIDOCAINE-EPINEPHRINE (PF) 1 %-1:200000 IJ SOLN
INTRAMUSCULAR | Status: AC
  Filled 2014-05-30: qty 10

## 2014-05-30 MED ORDER — HEPARIN SODIUM (PORCINE) 5000 UNIT/ML IJ SOLN
INTRAMUSCULAR | Status: DC | PRN
  Administered 2014-05-30: 500 mL

## 2014-05-30 MED ORDER — THROMBIN 20000 UNITS EX SOLR
CUTANEOUS | Status: AC
  Filled 2014-05-30: qty 20000

## 2014-05-30 MED ORDER — PHENYLEPHRINE 40 MCG/ML (10ML) SYRINGE FOR IV PUSH (FOR BLOOD PRESSURE SUPPORT)
PREFILLED_SYRINGE | INTRAVENOUS | Status: AC
  Filled 2014-05-30: qty 10

## 2014-05-30 MED ORDER — BUPIVACAINE HCL 0.5 % IJ SOLN
INTRAMUSCULAR | Status: AC
  Filled 2014-05-30: qty 1

## 2014-05-30 MED ORDER — PHENYLEPHRINE HCL 10 MG/ML IJ SOLN
INTRAMUSCULAR | Status: DC | PRN
  Administered 2014-05-30: 40 ug via INTRAVENOUS

## 2014-05-30 MED ORDER — FENTANYL CITRATE 0.05 MG/ML IJ SOLN
INTRAMUSCULAR | Status: DC | PRN
  Administered 2014-05-30: 50 ug via INTRAVENOUS

## 2014-05-30 MED ORDER — MIDAZOLAM HCL 2 MG/2ML IJ SOLN
INTRAMUSCULAR | Status: DC | PRN
  Administered 2014-05-30: 2 mg via INTRAVENOUS

## 2014-05-30 MED ORDER — MIDAZOLAM HCL 2 MG/2ML IJ SOLN
INTRAMUSCULAR | Status: AC
  Filled 2014-05-30: qty 2

## 2014-05-30 MED ORDER — LIDOCAINE HCL (CARDIAC) 20 MG/ML IV SOLN
INTRAVENOUS | Status: DC | PRN
  Administered 2014-05-30: 60 mg via INTRAVENOUS

## 2014-05-30 MED ORDER — FENTANYL CITRATE 0.05 MG/ML IJ SOLN
INTRAMUSCULAR | Status: AC
  Filled 2014-05-30: qty 2

## 2014-05-30 SURGICAL SUPPLY — 39 items
BANDAGE ELASTIC 4 VELCRO ST LF (GAUZE/BANDAGES/DRESSINGS) ×3 IMPLANT
BNDG GAUZE ELAST 4 BULKY (GAUZE/BANDAGES/DRESSINGS) ×3 IMPLANT
CANISTER SUCTION 2500CC (MISCELLANEOUS) ×3 IMPLANT
CLIP TI MEDIUM 24 (CLIP) ×3 IMPLANT
CLIP TI WIDE RED SMALL 24 (CLIP) ×3 IMPLANT
CORDS BIPOLAR (ELECTRODE) IMPLANT
COVER PROBE W GEL 5X96 (DRAPES) ×3 IMPLANT
DECANTER SPIKE VIAL GLASS SM (MISCELLANEOUS) ×3 IMPLANT
DRSG COVADERM 4X10 (GAUZE/BANDAGES/DRESSINGS) IMPLANT
DRSG COVADERM 4X8 (GAUZE/BANDAGES/DRESSINGS) IMPLANT
ELECT REM PT RETURN 9FT ADLT (ELECTROSURGICAL) ×3
ELECTRODE REM PT RTRN 9FT ADLT (ELECTROSURGICAL) ×1 IMPLANT
GLOVE BIO SURGEON STRL SZ 6.5 (GLOVE) ×6 IMPLANT
GLOVE BIO SURGEON STRL SZ7 (GLOVE) ×3 IMPLANT
GLOVE BIO SURGEONS STRL SZ 6.5 (GLOVE) ×3
GLOVE BIOGEL PI IND STRL 6.5 (GLOVE) ×2 IMPLANT
GLOVE BIOGEL PI IND STRL 7.5 (GLOVE) ×3 IMPLANT
GLOVE BIOGEL PI INDICATOR 6.5 (GLOVE) ×4
GLOVE BIOGEL PI INDICATOR 7.5 (GLOVE) ×6
GOWN STRL REUS W/ TWL LRG LVL3 (GOWN DISPOSABLE) ×4 IMPLANT
GOWN STRL REUS W/TWL LRG LVL3 (GOWN DISPOSABLE) ×8
KIT BASIN OR (CUSTOM PROCEDURE TRAY) ×3 IMPLANT
KIT ROOM TURNOVER OR (KITS) ×3 IMPLANT
LIQUID BAND (GAUZE/BANDAGES/DRESSINGS) ×3 IMPLANT
NS IRRIG 1000ML POUR BTL (IV SOLUTION) ×3 IMPLANT
PACK CV ACCESS (CUSTOM PROCEDURE TRAY) ×3 IMPLANT
PAD ARMBOARD 7.5X6 YLW CONV (MISCELLANEOUS) ×6 IMPLANT
SPONGE SURGIFOAM ABS GEL 100 (HEMOSTASIS) IMPLANT
STAPLER VISISTAT 35W (STAPLE) IMPLANT
SUT MNCRL AB 4-0 PS2 18 (SUTURE) ×6 IMPLANT
SUT PROLENE 6 0 BV (SUTURE) ×3 IMPLANT
SUT PROLENE 7 0 BV 1 (SUTURE) ×3 IMPLANT
SUT SILK 2 0 SH (SUTURE) ×3 IMPLANT
SUT VIC AB 2-0 CT1 27 (SUTURE) ×2
SUT VIC AB 2-0 CT1 TAPERPNT 27 (SUTURE) ×1 IMPLANT
SUT VIC AB 3-0 SH 27 (SUTURE) ×8
SUT VIC AB 3-0 SH 27X BRD (SUTURE) ×4 IMPLANT
UNDERPAD 30X30 INCONTINENT (UNDERPADS AND DIAPERS) ×3 IMPLANT
WATER STERILE IRR 1000ML POUR (IV SOLUTION) ×3 IMPLANT

## 2014-05-30 NOTE — Anesthesia Preprocedure Evaluation (Addendum)
Anesthesia Evaluation  Patient identified by MRN, date of birth, ID band Patient awake    Reviewed: Allergy & Precautions, NPO status , Patient's Chart, lab work & pertinent test results  Airway Mallampati: II   Neck ROM: full    Dental  (+) Dental Advisory Given   Pulmonary shortness of breath and with exertion, sleep apnea , former smoker,          Cardiovascular hypertension, Pt. on medications + CAD and +CHF     Neuro/Psych PSYCHIATRIC DISORDERS Depression    GI/Hepatic   Endo/Other  diabetes, Type 2, Insulin DependentHypothyroidism Morbid obesity  Renal/GU ESRF and DialysisRenal disease     Musculoskeletal   Abdominal   Peds  Hematology   Anesthesia Other Findings   Reproductive/Obstetrics                          Anesthesia Physical Anesthesia Plan  ASA: III  Anesthesia Plan: General   Post-op Pain Management:    Induction: Intravenous  Airway Management Planned: LMA  Additional Equipment:   Intra-op Plan:   Post-operative Plan:   Informed Consent: I have reviewed the patients History and Physical, chart, labs and discussed the procedure including the risks, benefits and alternatives for the proposed anesthesia with the patient or authorized representative who has indicated his/her understanding and acceptance.   Dental advisory given  Plan Discussed with: CRNA, Anesthesiologist and Surgeon  Anesthesia Plan Comments: (Dr. Bridgett Larsson requests GA.)        Anesthesia Quick Evaluation

## 2014-05-30 NOTE — Interval H&P Note (Signed)
History and Physical Interval Note:  05/30/2014 7:26 AM  Sandy Roy  has presented today for surgery, with the diagnosis of End Stage Renal Disease N18.6  The various methods of treatment have been discussed with the patient and family. After consideration of risks, benefits and other options for treatment, the patient has consented to  Procedure(s): SECOND STAGE BASILIC VEIN TRANSPOSITION (Right) as a surgical intervention .  The patient's history has been reviewed, patient examined, no change in status, stable for surgery.  I have reviewed the patient's chart and labs.  Questions were answered to the patient's satisfaction.     Tagan Bartram LIANG-YU

## 2014-05-30 NOTE — Anesthesia Postprocedure Evaluation (Signed)
Anesthesia Post Note  Patient: Sandy Roy  Procedure(s) Performed: Procedure(s) (LRB): SECOND STAGE BASILIC VEIN TRANSPOSITION (Right)  Anesthesia type: General  Patient location: PACU  Post pain: Pain level controlled and Adequate analgesia  Post assessment: Post-op Vital signs reviewed, Patient's Cardiovascular Status Stable, Respiratory Function Stable, Patent Airway and Pain level controlled  Last Vitals:  Filed Vitals:   05/30/14 1220  BP: 121/92  Pulse: 58  Temp:   Resp: 8    Post vital signs: Reviewed and stable  Level of consciousness: awake, alert  and oriented  Complications: No apparent anesthesia complications

## 2014-05-30 NOTE — Op Note (Signed)
    OPERATIVE NOTE   PROCEDURE: right second stage basilic vein transposition (brachiobasilic arteriovenous fistula) placement  PRE-OPERATIVE DIAGNOSIS: end stage renal disease   POST-OPERATIVE DIAGNOSIS: same as above   SURGEON: Adele Barthel, MD  ASSISTANT(S): Leontine Locket, PAC   ANESTHESIA: general  ESTIMATED BLOOD LOSS: 50 cc  FINDING(S): 1.  Fistula >6 mm throughout 2.  Palpable thrill at end of case  SPECIMEN(S):  none  INDICATIONS:   Sandy Roy is a 64 y.o. female who presents with end stage renal disease s/p successful right first stage basilic vein transposition.  The patient is scheduled for right second stage basilic vein transposition.  The patient is aware the risks include but are not limited to: bleeding, infection, steal syndrome, nerve damage, ischemic monomelic neuropathy, failure to mature, and need for additional procedures.  The patient is aware of the risks of the procedure and elects to proceed forward.  DESCRIPTION: After full informed written consent was obtained from the patient, the patient was brought back to the operating room and placed supine upon the operating table.  Prior to induction, the patient received IV antibiotics.   After obtaining adequate anesthesia, the patient was then prepped and draped in the standard fashion for a right arm access procedure.  I turned my attention first to identifying the patient's brachiobasilic arteriovenous fistula.  Using SonoSite guidance, the location of this fistula was marked out on the skin.   I made an longitudinal incision over the fistula from its arterial anastomosis up to its axillary extent.  I carefully dissected the fistula away from its adjacent nerves.  Eventually the entirety of this fistula was mobilized and I dissected a plane on top of the bicipital fascia with electrocautery.  The fistula was secured in its new location with loosely placed interrupted 3-0 Vicryl stitches tied to side branches  and the fascia.  The deep subcutaneous tissue was inspected for bleeding.  Bleeding was controlled with electrocautery and placement of large pieces of thrombin and gelfoam.  I washed out the surgical site after waiting a few minutes, and there was no further bleeding.  The fascia was reapproximated with interrupted stitches of 3-0 Vicryl to eliminate some of the deep space.  The superficial subcutaneous tissue was then reapproximated along the incision line with a running stitch of 3-0 Vicryl.  The skin was then reapproximated with a running subcuticular of 4-0 Monocryl.  The skin was then cleaned, dried, and reinforced with Dermabond.  The patient tolerated this procedure well.   COMPLICATIONS: none  CONDITION: stable   Adele Barthel, MD [facility] Office: [phone] Pager: [phone]  05/30/2014, 11:16 AM

## 2014-05-30 NOTE — H&P (View-Only) (Signed)
Postoperative Access Visit   History of Present Illness  Sandy Roy is a 65 y.o. year old female who presents for postoperative follow-up for: R 1st BVT (Date: 02/07/14).  The patient's wounds are healed.  The patient notes no steal symptoms.  The patient is able to complete their activities of daily living.  The patient's current symptoms are: none   Past Medical History  Diagnosis Date  . CONGESTIVE HEART FAILURE 12/14/2006  . CORONARY ARTERY DISEASE 12/14/2006  . HYPERLIPIDEMIA 12/14/2006  . HYPERTENSION 12/14/2006  . Hypoparathyroidism 12/21/2006  . Hypopotassemia 01/13/2007  . HYPOTHYROIDISM, POSTSURGICAL 12/15/2006  . NEOPLASM, MALIGNANT, THYROID GLAND 12/15/2006    Stage 1 papillary adenocarcinoma, one very small focus each lobe (38mm, 33mm)  6/09: CT neck: no evidence of recurrence  . Chronic kidney disease   . OBSTRUCTIVE SLEEP APNEA 02/23/2007    does not use a cpap  . DIABETES MELLITUS, TYPE II 12/14/2006    Type 2  . Depression     not recently  . Orbital fracture     left eye    Past Surgical History  Procedure Laterality Date  . Thyroidectomy  2008  . Cholecystectomy    . Tubal ligation    . Ovary surgery    . Tonsillectomy    . Orbital fracture surgery    . Colonoscopy    . Breast surgery Left     nipple leaking, had surgery to fix the leak  . Cardiac catheterization      ? 10 years ago  . Bascilic vein transposition Right 02/07/2014    Procedure: FIRST STAGE BASILIC VEIN TRANSPOSITION;  Surgeon: Conrad Emmett, MD;  Location: [facility];  Service: Vascular;  Laterality: Right;    History   Social History  . Marital Status: Widowed    Spouse Name: N/A  . Number of Children: N/A  . Years of Education: N/A   Occupational History  . Not on file.   Social History Main Topics  . Smoking status: Former Smoker    Types: Cigarettes  . Smokeless tobacco: Never Used  . Alcohol Use: No  . Drug Use: No  . Sexual Activity: Not on file   Other Topics  Concern  . Not on file   Social History Narrative    Family History  Problem Relation Age of Onset  . Cancer Other   . COPD Other   . Hypertension Other   . Hyperlipidemia Other   . Stroke Other   . Heart disease Other   . Diabetes Other   . Heart failure Mother   . Diabetes Mother   . Cancer Father     Current Outpatient Prescriptions on File Prior to Visit  Medication Sig Dispense Refill  . amLODipine (NORVASC) 10 MG tablet Take 10 mg by mouth daily.    Marland Kitchen aspirin (BAYER LOW STRENGTH) 81 MG EC tablet Take 81 mg by mouth daily.      . calcitRIOL (ROCALTROL) 0.5 MCG capsule     . cloNIDine (CATAPRES) 0.2 MG tablet Take 0.2 mg by mouth 2 (two) times daily.     Marland Kitchen doxazosin (CARDURA) 2 MG tablet Take 2 mg by mouth daily.    . fish oil-omega-3 fatty acids 1000 MG capsule Take 1 capsule by mouth daily.      Marland Kitchen glucose blood (ONETOUCH VERIO) test strip 1 each by Other route 2 (two) times daily. And lancets 2/day E11.9 60 each 12  . insulin NPH-regular Human (  NOVOLIN 70/30) (70-30) 100 UNIT/ML injection Inject 50 Units into the skin daily with breakfast. (Patient taking differently: Inject 55 Units into the skin daily with breakfast. ) 20 mL 11  . Insulin Syringe-Needle U-100 (B-D INSULIN SYRINGE) 31G X 5/16" 0.3 ML MISC Use 1 time per day. 100 each 1  . levothyroxine (SYNTHROID, LEVOTHROID) 112 MCG tablet Take 1 tablet (112 mcg total) by mouth daily before breakfast. 30 tablet 5  . Multiple Vitamins-Minerals (MULTIVITAMIN & MINERAL PO) Take 1 tablet by mouth daily.    . nitroGLYCERIN (NITROSTAT) 0.4 MG SL tablet Place 1 tablet (0.4 mg total) under the tongue every 5 (five) minutes as needed. 25 tablet 5  . sevelamer carbonate (RENVELA) 800 MG tablet Take 800 mg by mouth 3 (three) times daily with meals.    . triazolam (HALCION) 0.25 MG tablet 2 tablets by mouth at bedtime     . atorvastatin (LIPITOR) 20 MG tablet     . atorvastatin (LIPITOR) 40 MG tablet Take 1 tablet (40 mg total) by  mouth daily. (Patient not taking: Reported on 05/12/2014) 30 tablet 6  . furosemide (LASIX) 40 MG tablet     . furosemide (LASIX) 80 MG tablet Take 1 tablet (80 mg total) by mouth 2 (two) times daily. (Patient not taking: Reported on 05/12/2014) 60 tablet 0  . oxyCODONE-acetaminophen (PERCOCET/ROXICET) 5-325 MG per tablet Take 1 tablet by mouth every 6 (six) hours as needed. (Patient not taking: Reported on 05/12/2014) 30 tablet 0  . predniSONE (DELTASONE) 10 MG tablet      No current facility-administered medications on file prior to visit.    No Known Allergies  REVIEW OF SYSTEMS:  (Positives checked otherwise negative)  CARDIOVASCULAR:  []  chest pain, []  chest pressure, []  palpitations, []  shortness of breath when laying flat, []  shortness of breath with exertion,  []  pain in feet when walking, []  pain in feet when laying flat, []  history of blood clot in veins (DVT), []  history of phlebitis, []  swelling in legs, []  varicose veins  PULMONARY:  []  productive cough, []  asthma, []  wheezing  NEUROLOGIC:  []  weakness in arms or legs, []  numbness in arms or legs, []  difficulty speaking or slurred speech, []  temporary loss of vision in one eye, []  dizziness  HEMATOLOGIC:  []  bleeding problems, []  problems with blood clotting too easily  MUSCULOSKEL:  []  joint pain, []  joint swelling  GASTROINTEST:  []  vomiting blood, []  blood in stool     GENITOURINARY:  []  burning with urination, []  blood in urine, [x]  ESRD-HD: M-W-F  PSYCHIATRIC:  []  history of major depression  INTEGUMENTARY:  []  rashes, []  ulcers  For VQI Use Only  PRE-ADM LIVING: Home  AMB STATUS: Ambulatory  Physical Examination Filed Vitals:   05/12/14 1501  BP: 156/92  Pulse: 97  Temp: 98.6 F (37 C)  Resp: 16   Pulmonary: Sym exp, good air movt, CTAB, no rales, rhonchi, & wheezing  Cardiac: RRR, Nl S1, S2, no Murmurs, rubs or gallops  RUE: Incision is healed, skin feels warm, hand grip is 5/5, sensation in digits is  intact, palpable thrill , bruit can be auscultated, On Sonosite: fistula > 6 mm throughout  Sandy Roy is a 64 y.o. year old female who presents s/p successful R 1st BVT   R 1st stage BVT is ready for transposition.    This is scheduled for 22 MAR 16.  Thank you for allowing [place] to participate in this  patient's care.  Adele Barthel, MD [facility] Office: [phone] Pager: [phone]  05/12/2014, 9:03 AM

## 2014-05-30 NOTE — Transfer of Care (Signed)
Immediate Anesthesia Transfer of Care Note  Patient: Sandy Roy  Procedure(s) Performed: Procedure(s): SECOND STAGE BASILIC VEIN TRANSPOSITION (Right)  Patient Location: PACU  Anesthesia Type:General  Level of Consciousness: awake, alert  and oriented  Airway & Oxygen Therapy: Patient Spontanous Breathing and Patient connected to nasal cannula oxygen  Post-op Assessment: Report given to RN, Post -op Vital signs reviewed and stable and Patient moving all extremities X 4  Post vital signs: Reviewed and stable  Last Vitals:  Filed Vitals:   05/30/14 1134  BP: 120/54  Pulse: 66  Temp:   Resp: 13    Complications: No apparent anesthesia complications

## 2014-05-30 NOTE — Discharge Instructions (Signed)
° ° °  05/30/2014 Twanisha Mailey Kozlov [id number] 09-Apr-1950  Surgeon(s): Conrad Sells, MD  Procedure(s): SECOND STAGE BASILIC VEIN TRANSPOSITION  x Do not stick fistula for 6 weeks

## 2014-05-30 NOTE — Anesthesia Procedure Notes (Signed)
Procedure Name: LMA Insertion Date/Time: 05/30/2014 9:41 AM Performed by: Garrison Columbus T Pre-anesthesia Checklist: Patient identified, Emergency Drugs available, Suction available and Patient being monitored Patient Re-evaluated:Patient Re-evaluated prior to inductionOxygen Delivery Method: Circle system utilized Preoxygenation: Pre-oxygenation with 100% oxygen Intubation Type: IV induction LMA: LMA inserted LMA Size: 4.0 Number of attempts: 1 Placement Confirmation: positive ETCO2 and breath sounds checked- equal and bilateral Tube secured with: Tape Dental Injury: Teeth and Oropharynx as per pre-operative assessment

## 2014-05-31 ENCOUNTER — Encounter (HOSPITAL_COMMUNITY): Payer: Self-pay | Admitting: Vascular Surgery

## 2014-05-31 ENCOUNTER — Telehealth: Payer: Self-pay | Admitting: Vascular Surgery

## 2014-05-31 DIAGNOSIS — N2581 Secondary hyperparathyroidism of renal origin: Secondary | ICD-10-CM | POA: Diagnosis not present

## 2014-05-31 DIAGNOSIS — D631 Anemia in chronic kidney disease: Secondary | ICD-10-CM | POA: Diagnosis not present

## 2014-05-31 DIAGNOSIS — N186 End stage renal disease: Secondary | ICD-10-CM | POA: Diagnosis not present

## 2014-05-31 DIAGNOSIS — E1129 Type 2 diabetes mellitus with other diabetic kidney complication: Secondary | ICD-10-CM | POA: Diagnosis not present

## 2014-05-31 NOTE — Telephone Encounter (Addendum)
-----   Message from Gabriel Earing, [place] sent at 05/30/2014 11:24 AM EDT ----- S/p 2nd stage right BVT 05/30/14.  F/u with Dr. Bridgett Larsson in 3 weeks.  Thanks, Samantha   05/31/14: LM for pt re appt, dpm

## 2014-06-02 DIAGNOSIS — D631 Anemia in chronic kidney disease: Secondary | ICD-10-CM | POA: Diagnosis not present

## 2014-06-02 DIAGNOSIS — N186 End stage renal disease: Secondary | ICD-10-CM | POA: Diagnosis not present

## 2014-06-02 DIAGNOSIS — N2581 Secondary hyperparathyroidism of renal origin: Secondary | ICD-10-CM | POA: Diagnosis not present

## 2014-06-02 DIAGNOSIS — E1129 Type 2 diabetes mellitus with other diabetic kidney complication: Secondary | ICD-10-CM | POA: Diagnosis not present

## 2014-06-05 DIAGNOSIS — E1129 Type 2 diabetes mellitus with other diabetic kidney complication: Secondary | ICD-10-CM | POA: Diagnosis not present

## 2014-06-05 DIAGNOSIS — N2581 Secondary hyperparathyroidism of renal origin: Secondary | ICD-10-CM | POA: Diagnosis not present

## 2014-06-05 DIAGNOSIS — D631 Anemia in chronic kidney disease: Secondary | ICD-10-CM | POA: Diagnosis not present

## 2014-06-05 DIAGNOSIS — N186 End stage renal disease: Secondary | ICD-10-CM | POA: Diagnosis not present

## 2014-06-07 DIAGNOSIS — D631 Anemia in chronic kidney disease: Secondary | ICD-10-CM | POA: Diagnosis not present

## 2014-06-07 DIAGNOSIS — N186 End stage renal disease: Secondary | ICD-10-CM | POA: Diagnosis not present

## 2014-06-07 DIAGNOSIS — E1129 Type 2 diabetes mellitus with other diabetic kidney complication: Secondary | ICD-10-CM | POA: Diagnosis not present

## 2014-06-07 DIAGNOSIS — N2581 Secondary hyperparathyroidism of renal origin: Secondary | ICD-10-CM | POA: Diagnosis not present

## 2014-06-08 DIAGNOSIS — N186 End stage renal disease: Secondary | ICD-10-CM | POA: Diagnosis not present

## 2014-06-08 DIAGNOSIS — Z992 Dependence on renal dialysis: Secondary | ICD-10-CM | POA: Diagnosis not present

## 2014-06-09 DIAGNOSIS — Z23 Encounter for immunization: Secondary | ICD-10-CM | POA: Diagnosis not present

## 2014-06-09 DIAGNOSIS — D509 Iron deficiency anemia, unspecified: Secondary | ICD-10-CM | POA: Diagnosis not present

## 2014-06-09 DIAGNOSIS — N186 End stage renal disease: Secondary | ICD-10-CM | POA: Diagnosis not present

## 2014-06-09 DIAGNOSIS — D631 Anemia in chronic kidney disease: Secondary | ICD-10-CM | POA: Diagnosis not present

## 2014-06-09 DIAGNOSIS — E1129 Type 2 diabetes mellitus with other diabetic kidney complication: Secondary | ICD-10-CM | POA: Diagnosis not present

## 2014-06-09 DIAGNOSIS — N2581 Secondary hyperparathyroidism of renal origin: Secondary | ICD-10-CM | POA: Diagnosis not present

## 2014-06-12 DIAGNOSIS — N2581 Secondary hyperparathyroidism of renal origin: Secondary | ICD-10-CM | POA: Diagnosis not present

## 2014-06-12 DIAGNOSIS — D631 Anemia in chronic kidney disease: Secondary | ICD-10-CM | POA: Diagnosis not present

## 2014-06-12 DIAGNOSIS — E1129 Type 2 diabetes mellitus with other diabetic kidney complication: Secondary | ICD-10-CM | POA: Diagnosis not present

## 2014-06-12 DIAGNOSIS — N186 End stage renal disease: Secondary | ICD-10-CM | POA: Diagnosis not present

## 2014-06-12 DIAGNOSIS — D509 Iron deficiency anemia, unspecified: Secondary | ICD-10-CM | POA: Diagnosis not present

## 2014-06-12 DIAGNOSIS — Z23 Encounter for immunization: Secondary | ICD-10-CM | POA: Diagnosis not present

## 2014-06-14 DIAGNOSIS — D509 Iron deficiency anemia, unspecified: Secondary | ICD-10-CM | POA: Diagnosis not present

## 2014-06-14 DIAGNOSIS — N186 End stage renal disease: Secondary | ICD-10-CM | POA: Diagnosis not present

## 2014-06-14 DIAGNOSIS — Z23 Encounter for immunization: Secondary | ICD-10-CM | POA: Diagnosis not present

## 2014-06-14 DIAGNOSIS — N2581 Secondary hyperparathyroidism of renal origin: Secondary | ICD-10-CM | POA: Diagnosis not present

## 2014-06-14 DIAGNOSIS — D631 Anemia in chronic kidney disease: Secondary | ICD-10-CM | POA: Diagnosis not present

## 2014-06-14 DIAGNOSIS — E1129 Type 2 diabetes mellitus with other diabetic kidney complication: Secondary | ICD-10-CM | POA: Diagnosis not present

## 2014-06-16 DIAGNOSIS — N186 End stage renal disease: Secondary | ICD-10-CM | POA: Diagnosis not present

## 2014-06-16 DIAGNOSIS — E1129 Type 2 diabetes mellitus with other diabetic kidney complication: Secondary | ICD-10-CM | POA: Diagnosis not present

## 2014-06-16 DIAGNOSIS — D631 Anemia in chronic kidney disease: Secondary | ICD-10-CM | POA: Diagnosis not present

## 2014-06-16 DIAGNOSIS — N2581 Secondary hyperparathyroidism of renal origin: Secondary | ICD-10-CM | POA: Diagnosis not present

## 2014-06-16 DIAGNOSIS — D509 Iron deficiency anemia, unspecified: Secondary | ICD-10-CM | POA: Diagnosis not present

## 2014-06-16 DIAGNOSIS — Z23 Encounter for immunization: Secondary | ICD-10-CM | POA: Diagnosis not present

## 2014-06-19 DIAGNOSIS — N2581 Secondary hyperparathyroidism of renal origin: Secondary | ICD-10-CM | POA: Diagnosis not present

## 2014-06-19 DIAGNOSIS — E1129 Type 2 diabetes mellitus with other diabetic kidney complication: Secondary | ICD-10-CM | POA: Diagnosis not present

## 2014-06-19 DIAGNOSIS — Z23 Encounter for immunization: Secondary | ICD-10-CM | POA: Diagnosis not present

## 2014-06-19 DIAGNOSIS — D631 Anemia in chronic kidney disease: Secondary | ICD-10-CM | POA: Diagnosis not present

## 2014-06-19 DIAGNOSIS — N186 End stage renal disease: Secondary | ICD-10-CM | POA: Diagnosis not present

## 2014-06-19 DIAGNOSIS — D509 Iron deficiency anemia, unspecified: Secondary | ICD-10-CM | POA: Diagnosis not present

## 2014-06-21 DIAGNOSIS — E1129 Type 2 diabetes mellitus with other diabetic kidney complication: Secondary | ICD-10-CM | POA: Diagnosis not present

## 2014-06-21 DIAGNOSIS — N186 End stage renal disease: Secondary | ICD-10-CM | POA: Diagnosis not present

## 2014-06-21 DIAGNOSIS — Z23 Encounter for immunization: Secondary | ICD-10-CM | POA: Diagnosis not present

## 2014-06-21 DIAGNOSIS — N2581 Secondary hyperparathyroidism of renal origin: Secondary | ICD-10-CM | POA: Diagnosis not present

## 2014-06-21 DIAGNOSIS — D509 Iron deficiency anemia, unspecified: Secondary | ICD-10-CM | POA: Diagnosis not present

## 2014-06-21 DIAGNOSIS — D631 Anemia in chronic kidney disease: Secondary | ICD-10-CM | POA: Diagnosis not present

## 2014-06-22 ENCOUNTER — Encounter: Payer: Self-pay | Admitting: Vascular Surgery

## 2014-06-23 ENCOUNTER — Ambulatory Visit (INDEPENDENT_AMBULATORY_CARE_PROVIDER_SITE_OTHER): Payer: Self-pay | Admitting: Vascular Surgery

## 2014-06-23 ENCOUNTER — Encounter: Payer: Self-pay | Admitting: Vascular Surgery

## 2014-06-23 VITALS — BP 156/86 | HR 92 | Resp 16 | Ht 63.0 in | Wt 184.0 lb

## 2014-06-23 DIAGNOSIS — N186 End stage renal disease: Secondary | ICD-10-CM | POA: Diagnosis not present

## 2014-06-23 DIAGNOSIS — D509 Iron deficiency anemia, unspecified: Secondary | ICD-10-CM | POA: Diagnosis not present

## 2014-06-23 DIAGNOSIS — D631 Anemia in chronic kidney disease: Secondary | ICD-10-CM | POA: Diagnosis not present

## 2014-06-23 DIAGNOSIS — E1129 Type 2 diabetes mellitus with other diabetic kidney complication: Secondary | ICD-10-CM | POA: Diagnosis not present

## 2014-06-23 DIAGNOSIS — Z23 Encounter for immunization: Secondary | ICD-10-CM | POA: Diagnosis not present

## 2014-06-23 DIAGNOSIS — N2581 Secondary hyperparathyroidism of renal origin: Secondary | ICD-10-CM | POA: Diagnosis not present

## 2014-06-23 DIAGNOSIS — Z992 Dependence on renal dialysis: Secondary | ICD-10-CM

## 2014-06-23 NOTE — Progress Notes (Signed)
    Postoperative Access Visit   History of Present Illness  Sandy Roy is a 64 y.o. year old female who presents for postoperative follow-up for: R 2nd stage BVT (Date: 05/30/14).  The patient's wounds are healed.  The patient notes steal symptoms.  The patient is able to complete their activities of daily living.  The patient's current symptoms are: spots of numbness R upper arm.  For VQI Use Only  PRE-ADM LIVING: Home  AMB STATUS: Ambulatory  Physical Examination Filed Vitals:   06/23/14 1252  BP: 156/86  Pulse: 92  Resp:     RUE: Incision is healed, skin feels warm, hand grip is 5/5, palpable radial and ulnar signs, sensation in digits is intact, palpable thrill, bruit can be auscultated   Medical Decision Making  Sandy Roy is a 64 y.o. year old female who presents s/p R 2nd stage BVT.  The patient's access is ready for use in 1-2 weeks.  The patient's tunneled dialysis catheter can be removed after two successful cannulations and completed dialysis treatments.  Thank you for allowing [place] to participate in this patient's care.  Adele Barthel, MD [facility] Office: [phone] Pager: [phone]  06/23/2014, 1:09 PM

## 2014-06-23 NOTE — Progress Notes (Signed)
Filed Vitals:   06/23/14 1251 06/23/14 1252  BP: 159/89 156/86  Pulse: 97 92  Resp: 16   Height: 5\' 3"  (1.6 m)   Weight: 184 lb (83.462 kg)    Body mass index is 32.6 kg/(m^2).

## 2014-06-26 DIAGNOSIS — D631 Anemia in chronic kidney disease: Secondary | ICD-10-CM | POA: Diagnosis not present

## 2014-06-26 DIAGNOSIS — D509 Iron deficiency anemia, unspecified: Secondary | ICD-10-CM | POA: Diagnosis not present

## 2014-06-26 DIAGNOSIS — N2581 Secondary hyperparathyroidism of renal origin: Secondary | ICD-10-CM | POA: Diagnosis not present

## 2014-06-26 DIAGNOSIS — Z23 Encounter for immunization: Secondary | ICD-10-CM | POA: Diagnosis not present

## 2014-06-26 DIAGNOSIS — E1129 Type 2 diabetes mellitus with other diabetic kidney complication: Secondary | ICD-10-CM | POA: Diagnosis not present

## 2014-06-26 DIAGNOSIS — N186 End stage renal disease: Secondary | ICD-10-CM | POA: Diagnosis not present

## 2014-06-27 ENCOUNTER — Ambulatory Visit: Payer: Medicare Other | Admitting: Endocrinology

## 2014-06-27 DIAGNOSIS — E039 Hypothyroidism, unspecified: Secondary | ICD-10-CM | POA: Diagnosis not present

## 2014-06-27 DIAGNOSIS — I120 Hypertensive chronic kidney disease with stage 5 chronic kidney disease or end stage renal disease: Secondary | ICD-10-CM | POA: Diagnosis not present

## 2014-06-27 DIAGNOSIS — E782 Mixed hyperlipidemia: Secondary | ICD-10-CM | POA: Diagnosis not present

## 2014-06-27 DIAGNOSIS — M7500 Adhesive capsulitis of unspecified shoulder: Secondary | ICD-10-CM | POA: Diagnosis not present

## 2014-06-27 DIAGNOSIS — F3340 Major depressive disorder, recurrent, in remission, unspecified: Secondary | ICD-10-CM | POA: Diagnosis not present

## 2014-06-27 DIAGNOSIS — I2510 Atherosclerotic heart disease of native coronary artery without angina pectoris: Secondary | ICD-10-CM | POA: Diagnosis not present

## 2014-06-27 DIAGNOSIS — N186 End stage renal disease: Secondary | ICD-10-CM | POA: Diagnosis not present

## 2014-06-27 DIAGNOSIS — G4700 Insomnia, unspecified: Secondary | ICD-10-CM | POA: Diagnosis not present

## 2014-06-27 DIAGNOSIS — E1122 Type 2 diabetes mellitus with diabetic chronic kidney disease: Secondary | ICD-10-CM | POA: Diagnosis not present

## 2014-06-28 DIAGNOSIS — N2581 Secondary hyperparathyroidism of renal origin: Secondary | ICD-10-CM | POA: Diagnosis not present

## 2014-06-28 DIAGNOSIS — D509 Iron deficiency anemia, unspecified: Secondary | ICD-10-CM | POA: Diagnosis not present

## 2014-06-28 DIAGNOSIS — N186 End stage renal disease: Secondary | ICD-10-CM | POA: Diagnosis not present

## 2014-06-28 DIAGNOSIS — D631 Anemia in chronic kidney disease: Secondary | ICD-10-CM | POA: Diagnosis not present

## 2014-06-28 DIAGNOSIS — Z23 Encounter for immunization: Secondary | ICD-10-CM | POA: Diagnosis not present

## 2014-06-28 DIAGNOSIS — E1129 Type 2 diabetes mellitus with other diabetic kidney complication: Secondary | ICD-10-CM | POA: Diagnosis not present

## 2014-06-30 DIAGNOSIS — Z23 Encounter for immunization: Secondary | ICD-10-CM | POA: Diagnosis not present

## 2014-06-30 DIAGNOSIS — E1129 Type 2 diabetes mellitus with other diabetic kidney complication: Secondary | ICD-10-CM | POA: Diagnosis not present

## 2014-06-30 DIAGNOSIS — D631 Anemia in chronic kidney disease: Secondary | ICD-10-CM | POA: Diagnosis not present

## 2014-06-30 DIAGNOSIS — N186 End stage renal disease: Secondary | ICD-10-CM | POA: Diagnosis not present

## 2014-06-30 DIAGNOSIS — D509 Iron deficiency anemia, unspecified: Secondary | ICD-10-CM | POA: Diagnosis not present

## 2014-06-30 DIAGNOSIS — N2581 Secondary hyperparathyroidism of renal origin: Secondary | ICD-10-CM | POA: Diagnosis not present

## 2014-07-03 DIAGNOSIS — D509 Iron deficiency anemia, unspecified: Secondary | ICD-10-CM | POA: Diagnosis not present

## 2014-07-03 DIAGNOSIS — Z23 Encounter for immunization: Secondary | ICD-10-CM | POA: Diagnosis not present

## 2014-07-03 DIAGNOSIS — E1129 Type 2 diabetes mellitus with other diabetic kidney complication: Secondary | ICD-10-CM | POA: Diagnosis not present

## 2014-07-03 DIAGNOSIS — D631 Anemia in chronic kidney disease: Secondary | ICD-10-CM | POA: Diagnosis not present

## 2014-07-03 DIAGNOSIS — N2581 Secondary hyperparathyroidism of renal origin: Secondary | ICD-10-CM | POA: Diagnosis not present

## 2014-07-03 DIAGNOSIS — N186 End stage renal disease: Secondary | ICD-10-CM | POA: Diagnosis not present

## 2014-07-04 ENCOUNTER — Ambulatory Visit (INDEPENDENT_AMBULATORY_CARE_PROVIDER_SITE_OTHER): Payer: Medicare Other | Admitting: Endocrinology

## 2014-07-04 VITALS — BP 130/80 | HR 96 | Temp 98.6°F | Wt 186.0 lb

## 2014-07-04 DIAGNOSIS — N189 Chronic kidney disease, unspecified: Secondary | ICD-10-CM | POA: Diagnosis not present

## 2014-07-04 DIAGNOSIS — E1022 Type 1 diabetes mellitus with diabetic chronic kidney disease: Secondary | ICD-10-CM | POA: Diagnosis not present

## 2014-07-04 DIAGNOSIS — E1029 Type 1 diabetes mellitus with other diabetic kidney complication: Secondary | ICD-10-CM | POA: Diagnosis not present

## 2014-07-04 MED ORDER — INSULIN NPH ISOPHANE & REGULAR (70-30) 100 UNIT/ML ~~LOC~~ SUSP
35.0000 [IU] | Freq: Every day | SUBCUTANEOUS | Status: DC
Start: 2014-07-04 — End: 2014-10-16

## 2014-07-04 NOTE — Progress Notes (Signed)
Subjective:    Patient ID: Sandy Roy, female    DOB: 1951/02/17, 64 y.o.   MRN: [id number]  HPI Pt returns for f/u of diabetes mellitus: DM type: insulin-requiring type 2 Dx'ed: 123XX123 Complications: CAD and renal failure.  Therapy: insulin since 2015.   GDM: never.   DKA: never Severe hypoglycemia: never.   Pancreatitis: never.   Other info: she wants an inexpensive and simple insulin regimen.  Interval history: She decreased insulin to 35 units qam, due to frequent hypoglycemia.  She is now on dialysis.  no cbg record, but states cbg's vary from 100-150.  It is in general higher as the day goes on.  Past Medical History  Diagnosis Date  . CONGESTIVE HEART FAILURE 12/14/2006  . CORONARY ARTERY DISEASE 12/14/2006  . HYPERLIPIDEMIA 12/14/2006  . HYPERTENSION 12/14/2006  . Hypoparathyroidism 12/21/2006  . Hypopotassemia 01/13/2007  . HYPOTHYROIDISM, POSTSURGICAL 12/15/2006  . NEOPLASM, MALIGNANT, THYROID GLAND 12/15/2006    Stage 1 papillary adenocarcinoma, one very small focus each lobe (16mm, 35mm)  6/09: CT neck: no evidence of recurrence  . OBSTRUCTIVE SLEEP APNEA 02/23/2007    does not use a cpap  . DIABETES MELLITUS, TYPE II 12/14/2006    Type 2  . Depression     not recently  . Orbital fracture     left eye  . Shortness of breath dyspnea     with exertion  . Chronic kidney disease     ESRD-  Hemo- M_W_F    Past Surgical History  Procedure Laterality Date  . Thyroidectomy  2008  . Cholecystectomy    . Tubal ligation    . Ovary surgery    . Tonsillectomy    . Orbital fracture surgery    . Colonoscopy    . Breast surgery Left     nipple leaking, had surgery to fix the leak  . Cardiac catheterization      ? 10 years ago  . Bascilic vein transposition Right 02/07/2014    Procedure: FIRST STAGE BASILIC VEIN TRANSPOSITION;  Surgeon: Conrad [place], MD;  Location: [facility];  Service: Vascular;  Laterality: Right;  . Bascilic vein transposition Right 05/30/2014   Procedure: SECOND STAGE BASILIC VEIN TRANSPOSITION;  Surgeon: Conrad [place], MD;  Location: [facility];  Service: Vascular;  Laterality: Right;    History   Social History  . Marital Status: Widowed    Spouse Name: N/A  . Number of Children: N/A  . Years of Education: N/A   Occupational History  . Not on file.   Social History Main Topics  . Smoking status: Former Smoker    Types: Cigarettes  . Smokeless tobacco: Never Used     Comment: "quit years ago"  . Alcohol Use: No  . Drug Use: No  . Sexual Activity: Not on file   Other Topics Concern  . Not on file   Social History Narrative    Current Outpatient Prescriptions on File Prior to Visit  Medication Sig Dispense Refill  . amLODipine (NORVASC) 10 MG tablet Take 10 mg by mouth daily.    Marland Kitchen aspirin (BAYER LOW STRENGTH) 81 MG EC tablet Take 81 mg by mouth daily.      Marland Kitchen atorvastatin (LIPITOR) 40 MG tablet     . BELSOMRA 10 MG TABS     . calcitRIOL (ROCALTROL) 0.25 MCG capsule     . calcium acetate (PHOSLO) 667 MG capsule Take 667-2,001 mg by mouth See admin instructions. Takes 2001 mg  daily with meals and 667 mg with snacks    . calcium acetate (PHOSLO) 667 MG capsule     . cloNIDine (CATAPRES) 0.2 MG tablet Take 0.1 mg by mouth 2 (two) times daily.     Marland Kitchen doxazosin (CARDURA) 2 MG tablet Take 2 mg by mouth at bedtime.     . fish oil-omega-3 fatty acids 1000 MG capsule Take 1 capsule by mouth daily.      . furosemide (LASIX) 80 MG tablet     . glucose blood (ONETOUCH VERIO) test strip 1 each by Other route 2 (two) times daily. And lancets 2/day E11.9 60 each 12  . Insulin Syringe-Needle U-100 (B-D INSULIN SYRINGE) 31G X 5/16" 0.3 ML MISC Use 1 time per day. 100 each 1  . levothyroxine (SYNTHROID, LEVOTHROID) 112 MCG tablet Take 1 tablet (112 mcg total) by mouth daily before breakfast. 30 tablet 5  . nitroGLYCERIN (NITROSTAT) 0.4 MG SL tablet Place 1 tablet (0.4 mg total) under the tongue every 5 (five) minutes as needed. 25 tablet 5   . oxyCODONE-acetaminophen (PERCOCET/ROXICET) 5-325 MG per tablet Take 1 tablet by mouth every 6 (six) hours as needed. 30 tablet 0  . triazolam (HALCION) 0.25 MG tablet 2 tablets by mouth at bedtime      No current facility-administered medications on file prior to visit.    No Known Allergies  Family History  Problem Relation Age of Onset  . Cancer Other   . COPD Other   . Hypertension Other   . Hyperlipidemia Other   . Stroke Other   . Heart disease Other   . Diabetes Other   . Heart failure Mother   . Diabetes Mother   . Cancer Father     BP 130/80 mmHg  Pulse 96  Temp(Src) 98.6 F (37 C) (Oral)  Wt 186 lb (84.369 kg)  SpO2 95%   Review of Systems Denies LOC and weight.     Objective:   Physical Exam VITAL SIGNS:  See vs page GENERAL: no distress Pulses: dorsalis pedis intact bilat.   MSK: no deformity of the feet CV: trace bilat leg edema Skin:  no ulcer on the feet.  normal color and temp on the feet. Neuro: sensation is intact to touch on the feet. Ext: There is bilateral onychomycosis of the toenails.    Lab Results  Component Value Date   HGBA1C 5.5 07/04/2014      Assessment & Plan:  DM: overcontrolled.  Her insulin requirement has decreased--presumably due to declining renal function.   Patient is advised the following: Patient Instructions  check your blood sugar twice a day.  vary the time of day when you check, between before the 3 meals, and at bedtime.  also check if you have symptoms of your blood sugar being too high or too low.  please keep a record of the readings and bring it to your next appointment here.  You can write it on any piece of paper.  please call us sooner if your blood sugar goes below 70, or if you have a lot of readings over 200. Please come back for a follow-up appointment in 3 months.    On this type of insulin schedule, you should eat meals on a regular schedule.  If a meal is missed or significantly delayed, your blood  sugar could go low.  blood tests are being requested for you today.  We'll contact you with results.   For now, please continue the same  insulin.     addendum: reduce the insulin to 25 units with breakfast.

## 2014-07-04 NOTE — Patient Instructions (Addendum)
check your blood sugar twice a day.  vary the time of day when you check, between before the 3 meals, and at bedtime.  also check if you have symptoms of your blood sugar being too high or too low.  please keep a record of the readings and bring it to your next appointment here.  You can write it on any piece of paper.  please call us sooner if your blood sugar goes below 70, or if you have a lot of readings over 200. Please come back for a follow-up appointment in 3 months.    On this type of insulin schedule, you should eat meals on a regular schedule.  If a meal is missed or significantly delayed, your blood sugar could go low.  blood tests are being requested for you today.  We'll contact you with results.   For now, please continue the same insulin.

## 2014-07-05 DIAGNOSIS — N2581 Secondary hyperparathyroidism of renal origin: Secondary | ICD-10-CM | POA: Diagnosis not present

## 2014-07-05 DIAGNOSIS — D509 Iron deficiency anemia, unspecified: Secondary | ICD-10-CM | POA: Diagnosis not present

## 2014-07-05 DIAGNOSIS — M7502 Adhesive capsulitis of left shoulder: Secondary | ICD-10-CM | POA: Diagnosis not present

## 2014-07-05 DIAGNOSIS — Z1231 Encounter for screening mammogram for malignant neoplasm of breast: Secondary | ICD-10-CM | POA: Diagnosis not present

## 2014-07-05 DIAGNOSIS — Z23 Encounter for immunization: Secondary | ICD-10-CM | POA: Diagnosis not present

## 2014-07-05 DIAGNOSIS — D631 Anemia in chronic kidney disease: Secondary | ICD-10-CM | POA: Diagnosis not present

## 2014-07-05 DIAGNOSIS — N186 End stage renal disease: Secondary | ICD-10-CM | POA: Diagnosis not present

## 2014-07-05 DIAGNOSIS — E1129 Type 2 diabetes mellitus with other diabetic kidney complication: Secondary | ICD-10-CM | POA: Diagnosis not present

## 2014-07-05 LAB — HEMOGLOBIN A1C: Hgb A1c MFr Bld: 5.5 % (ref 4.6–6.5)

## 2014-07-05 LAB — TSH: TSH: 1.63 u[IU]/mL (ref 0.35–4.50)

## 2014-07-07 DIAGNOSIS — E1129 Type 2 diabetes mellitus with other diabetic kidney complication: Secondary | ICD-10-CM | POA: Diagnosis not present

## 2014-07-07 DIAGNOSIS — N186 End stage renal disease: Secondary | ICD-10-CM | POA: Diagnosis not present

## 2014-07-07 DIAGNOSIS — D509 Iron deficiency anemia, unspecified: Secondary | ICD-10-CM | POA: Diagnosis not present

## 2014-07-07 DIAGNOSIS — D631 Anemia in chronic kidney disease: Secondary | ICD-10-CM | POA: Diagnosis not present

## 2014-07-07 DIAGNOSIS — Z23 Encounter for immunization: Secondary | ICD-10-CM | POA: Diagnosis not present

## 2014-07-07 DIAGNOSIS — N2581 Secondary hyperparathyroidism of renal origin: Secondary | ICD-10-CM | POA: Diagnosis not present

## 2014-07-08 DIAGNOSIS — N186 End stage renal disease: Secondary | ICD-10-CM | POA: Diagnosis not present

## 2014-07-08 DIAGNOSIS — Z992 Dependence on renal dialysis: Secondary | ICD-10-CM | POA: Diagnosis not present

## 2014-07-08 DIAGNOSIS — E1122 Type 2 diabetes mellitus with diabetic chronic kidney disease: Secondary | ICD-10-CM | POA: Diagnosis not present

## 2014-07-10 DIAGNOSIS — E1129 Type 2 diabetes mellitus with other diabetic kidney complication: Secondary | ICD-10-CM | POA: Diagnosis not present

## 2014-07-10 DIAGNOSIS — N2581 Secondary hyperparathyroidism of renal origin: Secondary | ICD-10-CM | POA: Diagnosis not present

## 2014-07-10 DIAGNOSIS — D509 Iron deficiency anemia, unspecified: Secondary | ICD-10-CM | POA: Diagnosis not present

## 2014-07-10 DIAGNOSIS — D631 Anemia in chronic kidney disease: Secondary | ICD-10-CM | POA: Diagnosis not present

## 2014-07-10 DIAGNOSIS — N186 End stage renal disease: Secondary | ICD-10-CM | POA: Diagnosis not present

## 2014-07-11 DIAGNOSIS — R9431 Abnormal electrocardiogram [ECG] [EKG]: Secondary | ICD-10-CM | POA: Diagnosis not present

## 2014-07-11 DIAGNOSIS — Z01810 Encounter for preprocedural cardiovascular examination: Secondary | ICD-10-CM | POA: Diagnosis not present

## 2014-07-12 DIAGNOSIS — N2581 Secondary hyperparathyroidism of renal origin: Secondary | ICD-10-CM | POA: Diagnosis not present

## 2014-07-12 DIAGNOSIS — D631 Anemia in chronic kidney disease: Secondary | ICD-10-CM | POA: Diagnosis not present

## 2014-07-12 DIAGNOSIS — D509 Iron deficiency anemia, unspecified: Secondary | ICD-10-CM | POA: Diagnosis not present

## 2014-07-12 DIAGNOSIS — N186 End stage renal disease: Secondary | ICD-10-CM | POA: Diagnosis not present

## 2014-07-12 DIAGNOSIS — E1129 Type 2 diabetes mellitus with other diabetic kidney complication: Secondary | ICD-10-CM | POA: Diagnosis not present

## 2014-07-14 DIAGNOSIS — D509 Iron deficiency anemia, unspecified: Secondary | ICD-10-CM | POA: Diagnosis not present

## 2014-07-14 DIAGNOSIS — N186 End stage renal disease: Secondary | ICD-10-CM | POA: Diagnosis not present

## 2014-07-14 DIAGNOSIS — D631 Anemia in chronic kidney disease: Secondary | ICD-10-CM | POA: Diagnosis not present

## 2014-07-14 DIAGNOSIS — N2581 Secondary hyperparathyroidism of renal origin: Secondary | ICD-10-CM | POA: Diagnosis not present

## 2014-07-14 DIAGNOSIS — E1129 Type 2 diabetes mellitus with other diabetic kidney complication: Secondary | ICD-10-CM | POA: Diagnosis not present

## 2014-07-17 DIAGNOSIS — E1129 Type 2 diabetes mellitus with other diabetic kidney complication: Secondary | ICD-10-CM | POA: Diagnosis not present

## 2014-07-17 DIAGNOSIS — N186 End stage renal disease: Secondary | ICD-10-CM | POA: Diagnosis not present

## 2014-07-17 DIAGNOSIS — D631 Anemia in chronic kidney disease: Secondary | ICD-10-CM | POA: Diagnosis not present

## 2014-07-17 DIAGNOSIS — N2581 Secondary hyperparathyroidism of renal origin: Secondary | ICD-10-CM | POA: Diagnosis not present

## 2014-07-17 DIAGNOSIS — D509 Iron deficiency anemia, unspecified: Secondary | ICD-10-CM | POA: Diagnosis not present

## 2014-07-19 DIAGNOSIS — N186 End stage renal disease: Secondary | ICD-10-CM | POA: Diagnosis not present

## 2014-07-19 DIAGNOSIS — E1129 Type 2 diabetes mellitus with other diabetic kidney complication: Secondary | ICD-10-CM | POA: Diagnosis not present

## 2014-07-19 DIAGNOSIS — D509 Iron deficiency anemia, unspecified: Secondary | ICD-10-CM | POA: Diagnosis not present

## 2014-07-19 DIAGNOSIS — D631 Anemia in chronic kidney disease: Secondary | ICD-10-CM | POA: Diagnosis not present

## 2014-07-19 DIAGNOSIS — N2581 Secondary hyperparathyroidism of renal origin: Secondary | ICD-10-CM | POA: Diagnosis not present

## 2014-07-20 ENCOUNTER — Other Ambulatory Visit (HOSPITAL_COMMUNITY)
Admission: RE | Admit: 2014-07-20 | Discharge: 2014-07-20 | Disposition: A | Discharge status: Home / Self Care | Payer: Medicare Other | Source: Ambulatory Visit | Attending: Obstetrics & Gynecology | Admitting: Obstetrics & Gynecology

## 2014-07-20 ENCOUNTER — Other Ambulatory Visit: Payer: Self-pay | Admitting: Obstetrics & Gynecology

## 2014-07-20 DIAGNOSIS — Z01419 Encounter for gynecological examination (general) (routine) without abnormal findings: Secondary | ICD-10-CM | POA: Diagnosis not present

## 2014-07-20 DIAGNOSIS — Z124 Encounter for screening for malignant neoplasm of cervix: Secondary | ICD-10-CM | POA: Diagnosis not present

## 2014-07-20 DIAGNOSIS — Z1151 Encounter for screening for human papillomavirus (HPV): Secondary | ICD-10-CM | POA: Insufficient documentation

## 2014-07-21 DIAGNOSIS — D631 Anemia in chronic kidney disease: Secondary | ICD-10-CM | POA: Diagnosis not present

## 2014-07-21 DIAGNOSIS — E1129 Type 2 diabetes mellitus with other diabetic kidney complication: Secondary | ICD-10-CM | POA: Diagnosis not present

## 2014-07-21 DIAGNOSIS — D509 Iron deficiency anemia, unspecified: Secondary | ICD-10-CM | POA: Diagnosis not present

## 2014-07-21 DIAGNOSIS — N2581 Secondary hyperparathyroidism of renal origin: Secondary | ICD-10-CM | POA: Diagnosis not present

## 2014-07-21 DIAGNOSIS — N186 End stage renal disease: Secondary | ICD-10-CM | POA: Diagnosis not present

## 2014-07-21 LAB — CYTOLOGY - PAP

## 2014-07-24 DIAGNOSIS — D509 Iron deficiency anemia, unspecified: Secondary | ICD-10-CM | POA: Diagnosis not present

## 2014-07-24 DIAGNOSIS — E1129 Type 2 diabetes mellitus with other diabetic kidney complication: Secondary | ICD-10-CM | POA: Diagnosis not present

## 2014-07-24 DIAGNOSIS — N2581 Secondary hyperparathyroidism of renal origin: Secondary | ICD-10-CM | POA: Diagnosis not present

## 2014-07-24 DIAGNOSIS — D631 Anemia in chronic kidney disease: Secondary | ICD-10-CM | POA: Diagnosis not present

## 2014-07-24 DIAGNOSIS — N186 End stage renal disease: Secondary | ICD-10-CM | POA: Diagnosis not present

## 2014-07-26 DIAGNOSIS — N186 End stage renal disease: Secondary | ICD-10-CM | POA: Diagnosis not present

## 2014-07-26 DIAGNOSIS — E1129 Type 2 diabetes mellitus with other diabetic kidney complication: Secondary | ICD-10-CM | POA: Diagnosis not present

## 2014-07-26 DIAGNOSIS — D509 Iron deficiency anemia, unspecified: Secondary | ICD-10-CM | POA: Diagnosis not present

## 2014-07-26 DIAGNOSIS — N2581 Secondary hyperparathyroidism of renal origin: Secondary | ICD-10-CM | POA: Diagnosis not present

## 2014-07-26 DIAGNOSIS — D631 Anemia in chronic kidney disease: Secondary | ICD-10-CM | POA: Diagnosis not present

## 2014-07-28 DIAGNOSIS — D631 Anemia in chronic kidney disease: Secondary | ICD-10-CM | POA: Diagnosis not present

## 2014-07-28 DIAGNOSIS — E1129 Type 2 diabetes mellitus with other diabetic kidney complication: Secondary | ICD-10-CM | POA: Diagnosis not present

## 2014-07-28 DIAGNOSIS — D509 Iron deficiency anemia, unspecified: Secondary | ICD-10-CM | POA: Diagnosis not present

## 2014-07-28 DIAGNOSIS — N2581 Secondary hyperparathyroidism of renal origin: Secondary | ICD-10-CM | POA: Diagnosis not present

## 2014-07-28 DIAGNOSIS — N186 End stage renal disease: Secondary | ICD-10-CM | POA: Diagnosis not present

## 2014-07-31 DIAGNOSIS — D509 Iron deficiency anemia, unspecified: Secondary | ICD-10-CM | POA: Diagnosis not present

## 2014-07-31 DIAGNOSIS — N2581 Secondary hyperparathyroidism of renal origin: Secondary | ICD-10-CM | POA: Diagnosis not present

## 2014-07-31 DIAGNOSIS — D631 Anemia in chronic kidney disease: Secondary | ICD-10-CM | POA: Diagnosis not present

## 2014-07-31 DIAGNOSIS — E1129 Type 2 diabetes mellitus with other diabetic kidney complication: Secondary | ICD-10-CM | POA: Diagnosis not present

## 2014-07-31 DIAGNOSIS — N186 End stage renal disease: Secondary | ICD-10-CM | POA: Diagnosis not present

## 2014-08-02 DIAGNOSIS — N2581 Secondary hyperparathyroidism of renal origin: Secondary | ICD-10-CM | POA: Diagnosis not present

## 2014-08-02 DIAGNOSIS — N186 End stage renal disease: Secondary | ICD-10-CM | POA: Diagnosis not present

## 2014-08-02 DIAGNOSIS — E1129 Type 2 diabetes mellitus with other diabetic kidney complication: Secondary | ICD-10-CM | POA: Diagnosis not present

## 2014-08-02 DIAGNOSIS — D631 Anemia in chronic kidney disease: Secondary | ICD-10-CM | POA: Diagnosis not present

## 2014-08-02 DIAGNOSIS — D509 Iron deficiency anemia, unspecified: Secondary | ICD-10-CM | POA: Diagnosis not present

## 2014-08-04 DIAGNOSIS — N186 End stage renal disease: Secondary | ICD-10-CM | POA: Diagnosis not present

## 2014-08-04 DIAGNOSIS — E1129 Type 2 diabetes mellitus with other diabetic kidney complication: Secondary | ICD-10-CM | POA: Diagnosis not present

## 2014-08-04 DIAGNOSIS — D631 Anemia in chronic kidney disease: Secondary | ICD-10-CM | POA: Diagnosis not present

## 2014-08-04 DIAGNOSIS — D509 Iron deficiency anemia, unspecified: Secondary | ICD-10-CM | POA: Diagnosis not present

## 2014-08-04 DIAGNOSIS — N2581 Secondary hyperparathyroidism of renal origin: Secondary | ICD-10-CM | POA: Diagnosis not present

## 2014-08-07 DIAGNOSIS — N2581 Secondary hyperparathyroidism of renal origin: Secondary | ICD-10-CM | POA: Diagnosis not present

## 2014-08-07 DIAGNOSIS — N186 End stage renal disease: Secondary | ICD-10-CM | POA: Diagnosis not present

## 2014-08-07 DIAGNOSIS — E1129 Type 2 diabetes mellitus with other diabetic kidney complication: Secondary | ICD-10-CM | POA: Diagnosis not present

## 2014-08-07 DIAGNOSIS — D509 Iron deficiency anemia, unspecified: Secondary | ICD-10-CM | POA: Diagnosis not present

## 2014-08-07 DIAGNOSIS — D631 Anemia in chronic kidney disease: Secondary | ICD-10-CM | POA: Diagnosis not present

## 2014-08-08 DIAGNOSIS — E1122 Type 2 diabetes mellitus with diabetic chronic kidney disease: Secondary | ICD-10-CM | POA: Diagnosis not present

## 2014-08-08 DIAGNOSIS — N186 End stage renal disease: Secondary | ICD-10-CM | POA: Diagnosis not present

## 2014-08-08 DIAGNOSIS — Z992 Dependence on renal dialysis: Secondary | ICD-10-CM | POA: Diagnosis not present

## 2014-08-09 DIAGNOSIS — N186 End stage renal disease: Secondary | ICD-10-CM | POA: Diagnosis not present

## 2014-08-09 DIAGNOSIS — E1129 Type 2 diabetes mellitus with other diabetic kidney complication: Secondary | ICD-10-CM | POA: Diagnosis not present

## 2014-08-09 DIAGNOSIS — D509 Iron deficiency anemia, unspecified: Secondary | ICD-10-CM | POA: Diagnosis not present

## 2014-08-09 DIAGNOSIS — D631 Anemia in chronic kidney disease: Secondary | ICD-10-CM | POA: Diagnosis not present

## 2014-08-10 DIAGNOSIS — K5730 Diverticulosis of large intestine without perforation or abscess without bleeding: Secondary | ICD-10-CM | POA: Diagnosis not present

## 2014-08-10 DIAGNOSIS — Z1211 Encounter for screening for malignant neoplasm of colon: Secondary | ICD-10-CM | POA: Diagnosis not present

## 2014-08-11 DIAGNOSIS — N186 End stage renal disease: Secondary | ICD-10-CM | POA: Diagnosis not present

## 2014-08-11 DIAGNOSIS — E1129 Type 2 diabetes mellitus with other diabetic kidney complication: Secondary | ICD-10-CM | POA: Diagnosis not present

## 2014-08-11 DIAGNOSIS — D509 Iron deficiency anemia, unspecified: Secondary | ICD-10-CM | POA: Diagnosis not present

## 2014-08-11 DIAGNOSIS — D631 Anemia in chronic kidney disease: Secondary | ICD-10-CM | POA: Diagnosis not present

## 2014-08-14 DIAGNOSIS — D509 Iron deficiency anemia, unspecified: Secondary | ICD-10-CM | POA: Diagnosis not present

## 2014-08-14 DIAGNOSIS — N186 End stage renal disease: Secondary | ICD-10-CM | POA: Diagnosis not present

## 2014-08-14 DIAGNOSIS — E1129 Type 2 diabetes mellitus with other diabetic kidney complication: Secondary | ICD-10-CM | POA: Diagnosis not present

## 2014-08-14 DIAGNOSIS — D631 Anemia in chronic kidney disease: Secondary | ICD-10-CM | POA: Diagnosis not present

## 2014-08-16 DIAGNOSIS — E1129 Type 2 diabetes mellitus with other diabetic kidney complication: Secondary | ICD-10-CM | POA: Diagnosis not present

## 2014-08-16 DIAGNOSIS — N186 End stage renal disease: Secondary | ICD-10-CM | POA: Diagnosis not present

## 2014-08-16 DIAGNOSIS — D631 Anemia in chronic kidney disease: Secondary | ICD-10-CM | POA: Diagnosis not present

## 2014-08-16 DIAGNOSIS — D509 Iron deficiency anemia, unspecified: Secondary | ICD-10-CM | POA: Diagnosis not present

## 2014-08-18 DIAGNOSIS — D509 Iron deficiency anemia, unspecified: Secondary | ICD-10-CM | POA: Diagnosis not present

## 2014-08-18 DIAGNOSIS — D631 Anemia in chronic kidney disease: Secondary | ICD-10-CM | POA: Diagnosis not present

## 2014-08-18 DIAGNOSIS — N186 End stage renal disease: Secondary | ICD-10-CM | POA: Diagnosis not present

## 2014-08-18 DIAGNOSIS — E1129 Type 2 diabetes mellitus with other diabetic kidney complication: Secondary | ICD-10-CM | POA: Diagnosis not present

## 2014-08-21 DIAGNOSIS — D631 Anemia in chronic kidney disease: Secondary | ICD-10-CM | POA: Diagnosis not present

## 2014-08-21 DIAGNOSIS — E1129 Type 2 diabetes mellitus with other diabetic kidney complication: Secondary | ICD-10-CM | POA: Diagnosis not present

## 2014-08-21 DIAGNOSIS — N186 End stage renal disease: Secondary | ICD-10-CM | POA: Diagnosis not present

## 2014-08-21 DIAGNOSIS — D509 Iron deficiency anemia, unspecified: Secondary | ICD-10-CM | POA: Diagnosis not present

## 2014-08-23 DIAGNOSIS — D631 Anemia in chronic kidney disease: Secondary | ICD-10-CM | POA: Diagnosis not present

## 2014-08-23 DIAGNOSIS — N186 End stage renal disease: Secondary | ICD-10-CM | POA: Diagnosis not present

## 2014-08-23 DIAGNOSIS — E1129 Type 2 diabetes mellitus with other diabetic kidney complication: Secondary | ICD-10-CM | POA: Diagnosis not present

## 2014-08-23 DIAGNOSIS — D509 Iron deficiency anemia, unspecified: Secondary | ICD-10-CM | POA: Diagnosis not present

## 2014-08-25 DIAGNOSIS — D509 Iron deficiency anemia, unspecified: Secondary | ICD-10-CM | POA: Diagnosis not present

## 2014-08-25 DIAGNOSIS — D631 Anemia in chronic kidney disease: Secondary | ICD-10-CM | POA: Diagnosis not present

## 2014-08-25 DIAGNOSIS — N186 End stage renal disease: Secondary | ICD-10-CM | POA: Diagnosis not present

## 2014-08-25 DIAGNOSIS — E1129 Type 2 diabetes mellitus with other diabetic kidney complication: Secondary | ICD-10-CM | POA: Diagnosis not present

## 2014-08-28 DIAGNOSIS — E1129 Type 2 diabetes mellitus with other diabetic kidney complication: Secondary | ICD-10-CM | POA: Diagnosis not present

## 2014-08-28 DIAGNOSIS — D509 Iron deficiency anemia, unspecified: Secondary | ICD-10-CM | POA: Diagnosis not present

## 2014-08-28 DIAGNOSIS — N186 End stage renal disease: Secondary | ICD-10-CM | POA: Diagnosis not present

## 2014-08-28 DIAGNOSIS — D631 Anemia in chronic kidney disease: Secondary | ICD-10-CM | POA: Diagnosis not present

## 2014-08-30 DIAGNOSIS — E1129 Type 2 diabetes mellitus with other diabetic kidney complication: Secondary | ICD-10-CM | POA: Diagnosis not present

## 2014-08-30 DIAGNOSIS — N186 End stage renal disease: Secondary | ICD-10-CM | POA: Diagnosis not present

## 2014-08-30 DIAGNOSIS — D509 Iron deficiency anemia, unspecified: Secondary | ICD-10-CM | POA: Diagnosis not present

## 2014-08-30 DIAGNOSIS — D631 Anemia in chronic kidney disease: Secondary | ICD-10-CM | POA: Diagnosis not present

## 2014-09-01 DIAGNOSIS — E872 Acidosis: Secondary | ICD-10-CM | POA: Diagnosis not present

## 2014-09-01 DIAGNOSIS — Z7982 Long term (current) use of aspirin: Secondary | ICD-10-CM | POA: Diagnosis not present

## 2014-09-01 DIAGNOSIS — Z5181 Encounter for therapeutic drug level monitoring: Secondary | ICD-10-CM | POA: Diagnosis not present

## 2014-09-01 DIAGNOSIS — I120 Hypertensive chronic kidney disease with stage 5 chronic kidney disease or end stage renal disease: Secondary | ICD-10-CM | POA: Diagnosis not present

## 2014-09-01 DIAGNOSIS — Z85850 Personal history of malignant neoplasm of thyroid: Secondary | ICD-10-CM | POA: Diagnosis not present

## 2014-09-01 DIAGNOSIS — E785 Hyperlipidemia, unspecified: Secondary | ICD-10-CM | POA: Diagnosis present

## 2014-09-01 DIAGNOSIS — E875 Hyperkalemia: Secondary | ICD-10-CM | POA: Diagnosis not present

## 2014-09-01 DIAGNOSIS — Z713 Dietary counseling and surveillance: Secondary | ICD-10-CM | POA: Diagnosis present

## 2014-09-01 DIAGNOSIS — E119 Type 2 diabetes mellitus without complications: Secondary | ICD-10-CM | POA: Diagnosis not present

## 2014-09-01 DIAGNOSIS — Z992 Dependence on renal dialysis: Secondary | ICD-10-CM | POA: Diagnosis not present

## 2014-09-01 DIAGNOSIS — Z794 Long term (current) use of insulin: Secondary | ICD-10-CM | POA: Diagnosis not present

## 2014-09-01 DIAGNOSIS — Z87891 Personal history of nicotine dependence: Secondary | ICD-10-CM | POA: Diagnosis not present

## 2014-09-01 DIAGNOSIS — E669 Obesity, unspecified: Secondary | ICD-10-CM | POA: Diagnosis present

## 2014-09-01 DIAGNOSIS — Z79899 Other long term (current) drug therapy: Secondary | ICD-10-CM | POA: Diagnosis not present

## 2014-09-01 DIAGNOSIS — E871 Hypo-osmolality and hyponatremia: Secondary | ICD-10-CM | POA: Diagnosis not present

## 2014-09-01 DIAGNOSIS — N186 End stage renal disease: Secondary | ICD-10-CM | POA: Diagnosis not present

## 2014-09-01 DIAGNOSIS — T380X5A Adverse effect of glucocorticoids and synthetic analogues, initial encounter: Secondary | ICD-10-CM | POA: Diagnosis present

## 2014-09-01 DIAGNOSIS — E1165 Type 2 diabetes mellitus with hyperglycemia: Secondary | ICD-10-CM | POA: Diagnosis not present

## 2014-09-01 DIAGNOSIS — T8610 Unspecified complication of kidney transplant: Secondary | ICD-10-CM | POA: Diagnosis not present

## 2014-09-01 DIAGNOSIS — D696 Thrombocytopenia, unspecified: Secondary | ICD-10-CM | POA: Diagnosis present

## 2014-09-01 DIAGNOSIS — Z940 Kidney transplant status: Secondary | ICD-10-CM | POA: Diagnosis not present

## 2014-09-01 DIAGNOSIS — Z6833 Body mass index (BMI) 33.0-33.9, adult: Secondary | ICD-10-CM | POA: Diagnosis not present

## 2014-09-01 DIAGNOSIS — D72829 Elevated white blood cell count, unspecified: Secondary | ICD-10-CM | POA: Diagnosis present

## 2014-09-01 DIAGNOSIS — E039 Hypothyroidism, unspecified: Secondary | ICD-10-CM | POA: Diagnosis present

## 2014-09-01 DIAGNOSIS — D631 Anemia in chronic kidney disease: Secondary | ICD-10-CM | POA: Diagnosis present

## 2014-09-01 DIAGNOSIS — Z4822 Encounter for aftercare following kidney transplant: Secondary | ICD-10-CM | POA: Diagnosis not present

## 2014-09-01 DIAGNOSIS — I440 Atrioventricular block, first degree: Secondary | ICD-10-CM | POA: Diagnosis not present

## 2014-09-01 DIAGNOSIS — E1121 Type 2 diabetes mellitus with diabetic nephropathy: Secondary | ICD-10-CM | POA: Diagnosis present

## 2014-09-07 DIAGNOSIS — Z5181 Encounter for therapeutic drug level monitoring: Secondary | ICD-10-CM | POA: Diagnosis not present

## 2014-09-07 DIAGNOSIS — Z794 Long term (current) use of insulin: Secondary | ICD-10-CM | POA: Diagnosis not present

## 2014-09-07 DIAGNOSIS — E785 Hyperlipidemia, unspecified: Secondary | ICD-10-CM | POA: Diagnosis not present

## 2014-09-07 DIAGNOSIS — Z940 Kidney transplant status: Secondary | ICD-10-CM | POA: Diagnosis not present

## 2014-09-07 DIAGNOSIS — I120 Hypertensive chronic kidney disease with stage 5 chronic kidney disease or end stage renal disease: Secondary | ICD-10-CM | POA: Diagnosis not present

## 2014-09-07 DIAGNOSIS — Z79899 Other long term (current) drug therapy: Secondary | ICD-10-CM | POA: Diagnosis not present

## 2014-09-07 DIAGNOSIS — Z85850 Personal history of malignant neoplasm of thyroid: Secondary | ICD-10-CM | POA: Diagnosis not present

## 2014-09-07 DIAGNOSIS — E669 Obesity, unspecified: Secondary | ICD-10-CM | POA: Diagnosis not present

## 2014-09-07 DIAGNOSIS — Z4822 Encounter for aftercare following kidney transplant: Secondary | ICD-10-CM | POA: Diagnosis not present

## 2014-09-07 DIAGNOSIS — E1121 Type 2 diabetes mellitus with diabetic nephropathy: Secondary | ICD-10-CM | POA: Diagnosis not present

## 2014-09-07 DIAGNOSIS — Z992 Dependence on renal dialysis: Secondary | ICD-10-CM | POA: Diagnosis not present

## 2014-09-07 DIAGNOSIS — Z7982 Long term (current) use of aspirin: Secondary | ICD-10-CM | POA: Diagnosis not present

## 2014-09-07 DIAGNOSIS — T8610 Unspecified complication of kidney transplant: Secondary | ICD-10-CM | POA: Diagnosis not present

## 2014-09-07 DIAGNOSIS — I2510 Atherosclerotic heart disease of native coronary artery without angina pectoris: Secondary | ICD-10-CM | POA: Diagnosis not present

## 2014-09-07 DIAGNOSIS — Z9861 Coronary angioplasty status: Secondary | ICD-10-CM | POA: Diagnosis not present

## 2014-09-07 DIAGNOSIS — D631 Anemia in chronic kidney disease: Secondary | ICD-10-CM | POA: Diagnosis not present

## 2014-09-07 DIAGNOSIS — E1122 Type 2 diabetes mellitus with diabetic chronic kidney disease: Secondary | ICD-10-CM | POA: Diagnosis not present

## 2014-09-07 DIAGNOSIS — N186 End stage renal disease: Secondary | ICD-10-CM | POA: Diagnosis not present

## 2014-09-09 DIAGNOSIS — I120 Hypertensive chronic kidney disease with stage 5 chronic kidney disease or end stage renal disease: Secondary | ICD-10-CM | POA: Diagnosis not present

## 2014-09-09 DIAGNOSIS — Z794 Long term (current) use of insulin: Secondary | ICD-10-CM | POA: Diagnosis not present

## 2014-09-09 DIAGNOSIS — Z4822 Encounter for aftercare following kidney transplant: Secondary | ICD-10-CM | POA: Diagnosis not present

## 2014-09-09 DIAGNOSIS — N186 End stage renal disease: Secondary | ICD-10-CM | POA: Diagnosis not present

## 2014-09-09 DIAGNOSIS — Z79899 Other long term (current) drug therapy: Secondary | ICD-10-CM | POA: Diagnosis not present

## 2014-09-09 DIAGNOSIS — Z940 Kidney transplant status: Secondary | ICD-10-CM | POA: Diagnosis not present

## 2014-09-09 DIAGNOSIS — E669 Obesity, unspecified: Secondary | ICD-10-CM | POA: Diagnosis not present

## 2014-09-09 DIAGNOSIS — T8619 Other complication of kidney transplant: Secondary | ICD-10-CM | POA: Diagnosis not present

## 2014-09-09 DIAGNOSIS — D899 Disorder involving the immune mechanism, unspecified: Secondary | ICD-10-CM | POA: Diagnosis not present

## 2014-09-09 DIAGNOSIS — Z7982 Long term (current) use of aspirin: Secondary | ICD-10-CM | POA: Diagnosis not present

## 2014-09-09 DIAGNOSIS — D631 Anemia in chronic kidney disease: Secondary | ICD-10-CM | POA: Diagnosis not present

## 2014-09-09 DIAGNOSIS — I2510 Atherosclerotic heart disease of native coronary artery without angina pectoris: Secondary | ICD-10-CM | POA: Diagnosis not present

## 2014-09-09 DIAGNOSIS — E1121 Type 2 diabetes mellitus with diabetic nephropathy: Secondary | ICD-10-CM | POA: Diagnosis not present

## 2014-09-09 DIAGNOSIS — E119 Type 2 diabetes mellitus without complications: Secondary | ICD-10-CM | POA: Diagnosis not present

## 2014-09-09 DIAGNOSIS — Z79891 Long term (current) use of opiate analgesic: Secondary | ICD-10-CM | POA: Diagnosis not present

## 2014-09-09 DIAGNOSIS — Z5181 Encounter for therapeutic drug level monitoring: Secondary | ICD-10-CM | POA: Diagnosis not present

## 2014-09-12 DIAGNOSIS — I10 Essential (primary) hypertension: Secondary | ICD-10-CM | POA: Diagnosis not present

## 2014-09-12 DIAGNOSIS — Z79899 Other long term (current) drug therapy: Secondary | ICD-10-CM | POA: Diagnosis not present

## 2014-09-12 DIAGNOSIS — T8619 Other complication of kidney transplant: Secondary | ICD-10-CM | POA: Diagnosis not present

## 2014-09-12 DIAGNOSIS — I120 Hypertensive chronic kidney disease with stage 5 chronic kidney disease or end stage renal disease: Secondary | ICD-10-CM | POA: Diagnosis not present

## 2014-09-12 DIAGNOSIS — Z9861 Coronary angioplasty status: Secondary | ICD-10-CM | POA: Diagnosis not present

## 2014-09-12 DIAGNOSIS — Z992 Dependence on renal dialysis: Secondary | ICD-10-CM | POA: Diagnosis not present

## 2014-09-12 DIAGNOSIS — E039 Hypothyroidism, unspecified: Secondary | ICD-10-CM | POA: Diagnosis not present

## 2014-09-12 DIAGNOSIS — I25810 Atherosclerosis of coronary artery bypass graft(s) without angina pectoris: Secondary | ICD-10-CM | POA: Diagnosis not present

## 2014-09-12 DIAGNOSIS — E119 Type 2 diabetes mellitus without complications: Secondary | ICD-10-CM | POA: Diagnosis not present

## 2014-09-12 DIAGNOSIS — N186 End stage renal disease: Secondary | ICD-10-CM | POA: Diagnosis not present

## 2014-09-12 DIAGNOSIS — Z940 Kidney transplant status: Secondary | ICD-10-CM | POA: Diagnosis not present

## 2014-09-12 DIAGNOSIS — Z7982 Long term (current) use of aspirin: Secondary | ICD-10-CM | POA: Diagnosis not present

## 2014-09-14 DIAGNOSIS — Z794 Long term (current) use of insulin: Secondary | ICD-10-CM | POA: Diagnosis not present

## 2014-09-14 DIAGNOSIS — D899 Disorder involving the immune mechanism, unspecified: Secondary | ICD-10-CM | POA: Diagnosis not present

## 2014-09-14 DIAGNOSIS — E119 Type 2 diabetes mellitus without complications: Secondary | ICD-10-CM | POA: Diagnosis not present

## 2014-09-14 DIAGNOSIS — N186 End stage renal disease: Secondary | ICD-10-CM | POA: Diagnosis not present

## 2014-09-14 DIAGNOSIS — Z940 Kidney transplant status: Secondary | ICD-10-CM | POA: Diagnosis not present

## 2014-09-14 DIAGNOSIS — Z4822 Encounter for aftercare following kidney transplant: Secondary | ICD-10-CM | POA: Diagnosis not present

## 2014-09-14 DIAGNOSIS — Z9861 Coronary angioplasty status: Secondary | ICD-10-CM | POA: Diagnosis not present

## 2014-09-14 DIAGNOSIS — Z79899 Other long term (current) drug therapy: Secondary | ICD-10-CM | POA: Diagnosis not present

## 2014-09-14 DIAGNOSIS — I120 Hypertensive chronic kidney disease with stage 5 chronic kidney disease or end stage renal disease: Secondary | ICD-10-CM | POA: Diagnosis not present

## 2014-09-14 DIAGNOSIS — E039 Hypothyroidism, unspecified: Secondary | ICD-10-CM | POA: Diagnosis not present

## 2014-09-14 DIAGNOSIS — I2510 Atherosclerotic heart disease of native coronary artery without angina pectoris: Secondary | ICD-10-CM | POA: Diagnosis not present

## 2014-09-18 DIAGNOSIS — D899 Disorder involving the immune mechanism, unspecified: Secondary | ICD-10-CM | POA: Diagnosis not present

## 2014-09-18 DIAGNOSIS — Z940 Kidney transplant status: Secondary | ICD-10-CM | POA: Diagnosis not present

## 2014-09-18 DIAGNOSIS — I120 Hypertensive chronic kidney disease with stage 5 chronic kidney disease or end stage renal disease: Secondary | ICD-10-CM | POA: Diagnosis not present

## 2014-09-18 DIAGNOSIS — N186 End stage renal disease: Secondary | ICD-10-CM | POA: Diagnosis not present

## 2014-09-18 DIAGNOSIS — Z7982 Long term (current) use of aspirin: Secondary | ICD-10-CM | POA: Diagnosis not present

## 2014-09-18 DIAGNOSIS — Z7952 Long term (current) use of systemic steroids: Secondary | ICD-10-CM | POA: Diagnosis not present

## 2014-09-18 DIAGNOSIS — Z794 Long term (current) use of insulin: Secondary | ICD-10-CM | POA: Diagnosis not present

## 2014-09-18 DIAGNOSIS — E1122 Type 2 diabetes mellitus with diabetic chronic kidney disease: Secondary | ICD-10-CM | POA: Diagnosis not present

## 2014-09-18 DIAGNOSIS — I2510 Atherosclerotic heart disease of native coronary artery without angina pectoris: Secondary | ICD-10-CM | POA: Diagnosis not present

## 2014-09-18 DIAGNOSIS — Z79899 Other long term (current) drug therapy: Secondary | ICD-10-CM | POA: Diagnosis not present

## 2014-09-18 DIAGNOSIS — Z4822 Encounter for aftercare following kidney transplant: Secondary | ICD-10-CM | POA: Diagnosis not present

## 2014-09-18 DIAGNOSIS — D8989 Other specified disorders involving the immune mechanism, not elsewhere classified: Secondary | ICD-10-CM | POA: Diagnosis not present

## 2014-09-18 DIAGNOSIS — E039 Hypothyroidism, unspecified: Secondary | ICD-10-CM | POA: Diagnosis not present

## 2014-09-18 DIAGNOSIS — Z792 Long term (current) use of antibiotics: Secondary | ICD-10-CM | POA: Diagnosis not present

## 2014-09-18 DIAGNOSIS — Z5181 Encounter for therapeutic drug level monitoring: Secondary | ICD-10-CM | POA: Diagnosis not present

## 2014-09-19 DIAGNOSIS — D899 Disorder involving the immune mechanism, unspecified: Secondary | ICD-10-CM | POA: Diagnosis not present

## 2014-09-19 DIAGNOSIS — Z940 Kidney transplant status: Secondary | ICD-10-CM | POA: Diagnosis not present

## 2014-09-19 DIAGNOSIS — E1122 Type 2 diabetes mellitus with diabetic chronic kidney disease: Secondary | ICD-10-CM | POA: Diagnosis not present

## 2014-09-19 DIAGNOSIS — Z5181 Encounter for therapeutic drug level monitoring: Secondary | ICD-10-CM | POA: Diagnosis not present

## 2014-09-19 DIAGNOSIS — Z4822 Encounter for aftercare following kidney transplant: Secondary | ICD-10-CM | POA: Diagnosis not present

## 2014-09-19 DIAGNOSIS — Z79899 Other long term (current) drug therapy: Secondary | ICD-10-CM | POA: Diagnosis not present

## 2014-09-19 DIAGNOSIS — N186 End stage renal disease: Secondary | ICD-10-CM | POA: Diagnosis not present

## 2014-09-19 DIAGNOSIS — I120 Hypertensive chronic kidney disease with stage 5 chronic kidney disease or end stage renal disease: Secondary | ICD-10-CM | POA: Diagnosis not present

## 2014-09-21 DIAGNOSIS — I120 Hypertensive chronic kidney disease with stage 5 chronic kidney disease or end stage renal disease: Secondary | ICD-10-CM | POA: Diagnosis not present

## 2014-09-21 DIAGNOSIS — Z7952 Long term (current) use of systemic steroids: Secondary | ICD-10-CM | POA: Diagnosis not present

## 2014-09-21 DIAGNOSIS — N186 End stage renal disease: Secondary | ICD-10-CM | POA: Diagnosis not present

## 2014-09-21 DIAGNOSIS — Z794 Long term (current) use of insulin: Secondary | ICD-10-CM | POA: Diagnosis not present

## 2014-09-21 DIAGNOSIS — Z4822 Encounter for aftercare following kidney transplant: Secondary | ICD-10-CM | POA: Diagnosis not present

## 2014-09-21 DIAGNOSIS — E039 Hypothyroidism, unspecified: Secondary | ICD-10-CM | POA: Diagnosis not present

## 2014-09-21 DIAGNOSIS — I2510 Atherosclerotic heart disease of native coronary artery without angina pectoris: Secondary | ICD-10-CM | POA: Diagnosis not present

## 2014-09-21 DIAGNOSIS — Z940 Kidney transplant status: Secondary | ICD-10-CM | POA: Diagnosis not present

## 2014-09-21 DIAGNOSIS — Z792 Long term (current) use of antibiotics: Secondary | ICD-10-CM | POA: Diagnosis not present

## 2014-09-21 DIAGNOSIS — I129 Hypertensive chronic kidney disease with stage 1 through stage 4 chronic kidney disease, or unspecified chronic kidney disease: Secondary | ICD-10-CM | POA: Diagnosis not present

## 2014-09-21 DIAGNOSIS — Z9861 Coronary angioplasty status: Secondary | ICD-10-CM | POA: Diagnosis not present

## 2014-09-21 DIAGNOSIS — D899 Disorder involving the immune mechanism, unspecified: Secondary | ICD-10-CM | POA: Diagnosis not present

## 2014-09-21 DIAGNOSIS — Z992 Dependence on renal dialysis: Secondary | ICD-10-CM | POA: Diagnosis not present

## 2014-09-21 DIAGNOSIS — Z7982 Long term (current) use of aspirin: Secondary | ICD-10-CM | POA: Diagnosis not present

## 2014-09-21 DIAGNOSIS — Z79899 Other long term (current) drug therapy: Secondary | ICD-10-CM | POA: Diagnosis not present

## 2014-09-21 DIAGNOSIS — E119 Type 2 diabetes mellitus without complications: Secondary | ICD-10-CM | POA: Diagnosis not present

## 2014-09-25 DIAGNOSIS — I2510 Atherosclerotic heart disease of native coronary artery without angina pectoris: Secondary | ICD-10-CM | POA: Diagnosis not present

## 2014-09-25 DIAGNOSIS — E1122 Type 2 diabetes mellitus with diabetic chronic kidney disease: Secondary | ICD-10-CM | POA: Diagnosis not present

## 2014-09-25 DIAGNOSIS — Z7982 Long term (current) use of aspirin: Secondary | ICD-10-CM | POA: Diagnosis not present

## 2014-09-25 DIAGNOSIS — I10 Essential (primary) hypertension: Secondary | ICD-10-CM | POA: Diagnosis not present

## 2014-09-25 DIAGNOSIS — Z940 Kidney transplant status: Secondary | ICD-10-CM | POA: Diagnosis not present

## 2014-09-25 DIAGNOSIS — E860 Dehydration: Secondary | ICD-10-CM | POA: Diagnosis not present

## 2014-09-25 DIAGNOSIS — Z955 Presence of coronary angioplasty implant and graft: Secondary | ICD-10-CM | POA: Diagnosis not present

## 2014-09-25 DIAGNOSIS — E039 Hypothyroidism, unspecified: Secondary | ICD-10-CM | POA: Diagnosis not present

## 2014-09-25 DIAGNOSIS — Z794 Long term (current) use of insulin: Secondary | ICD-10-CM | POA: Diagnosis not present

## 2014-09-25 DIAGNOSIS — Z4822 Encounter for aftercare following kidney transplant: Secondary | ICD-10-CM | POA: Diagnosis not present

## 2014-09-25 DIAGNOSIS — E1165 Type 2 diabetes mellitus with hyperglycemia: Secondary | ICD-10-CM | POA: Diagnosis not present

## 2014-09-25 DIAGNOSIS — T8610 Unspecified complication of kidney transplant: Secondary | ICD-10-CM | POA: Diagnosis not present

## 2014-09-25 DIAGNOSIS — D8989 Other specified disorders involving the immune mechanism, not elsewhere classified: Secondary | ICD-10-CM | POA: Diagnosis not present

## 2014-09-28 DIAGNOSIS — T8619 Other complication of kidney transplant: Secondary | ICD-10-CM | POA: Diagnosis not present

## 2014-09-28 DIAGNOSIS — Z9861 Coronary angioplasty status: Secondary | ICD-10-CM | POA: Diagnosis not present

## 2014-09-28 DIAGNOSIS — Z940 Kidney transplant status: Secondary | ICD-10-CM | POA: Diagnosis not present

## 2014-09-28 DIAGNOSIS — Z85850 Personal history of malignant neoplasm of thyroid: Secondary | ICD-10-CM | POA: Diagnosis not present

## 2014-09-28 DIAGNOSIS — Z992 Dependence on renal dialysis: Secondary | ICD-10-CM | POA: Diagnosis not present

## 2014-09-28 DIAGNOSIS — E119 Type 2 diabetes mellitus without complications: Secondary | ICD-10-CM | POA: Diagnosis not present

## 2014-09-28 DIAGNOSIS — I2510 Atherosclerotic heart disease of native coronary artery without angina pectoris: Secondary | ICD-10-CM | POA: Diagnosis not present

## 2014-09-28 DIAGNOSIS — I120 Hypertensive chronic kidney disease with stage 5 chronic kidney disease or end stage renal disease: Secondary | ICD-10-CM | POA: Diagnosis not present

## 2014-09-28 DIAGNOSIS — E039 Hypothyroidism, unspecified: Secondary | ICD-10-CM | POA: Diagnosis not present

## 2014-09-28 DIAGNOSIS — E669 Obesity, unspecified: Secondary | ICD-10-CM | POA: Diagnosis not present

## 2014-09-28 DIAGNOSIS — E785 Hyperlipidemia, unspecified: Secondary | ICD-10-CM | POA: Diagnosis not present

## 2014-09-28 DIAGNOSIS — E1121 Type 2 diabetes mellitus with diabetic nephropathy: Secondary | ICD-10-CM | POA: Diagnosis not present

## 2014-09-28 DIAGNOSIS — Z7982 Long term (current) use of aspirin: Secondary | ICD-10-CM | POA: Diagnosis not present

## 2014-09-28 DIAGNOSIS — Z79899 Other long term (current) drug therapy: Secondary | ICD-10-CM | POA: Diagnosis not present

## 2014-09-28 DIAGNOSIS — D8989 Other specified disorders involving the immune mechanism, not elsewhere classified: Secondary | ICD-10-CM | POA: Diagnosis not present

## 2014-09-28 DIAGNOSIS — I150 Renovascular hypertension: Secondary | ICD-10-CM | POA: Diagnosis not present

## 2014-09-28 DIAGNOSIS — N186 End stage renal disease: Secondary | ICD-10-CM | POA: Diagnosis not present

## 2014-09-28 DIAGNOSIS — Z4822 Encounter for aftercare following kidney transplant: Secondary | ICD-10-CM | POA: Diagnosis not present

## 2014-10-02 DIAGNOSIS — E118 Type 2 diabetes mellitus with unspecified complications: Secondary | ICD-10-CM | POA: Diagnosis not present

## 2014-10-02 DIAGNOSIS — T8610 Unspecified complication of kidney transplant: Secondary | ICD-10-CM | POA: Diagnosis not present

## 2014-10-02 DIAGNOSIS — Z940 Kidney transplant status: Secondary | ICD-10-CM | POA: Diagnosis not present

## 2014-10-02 DIAGNOSIS — N186 End stage renal disease: Secondary | ICD-10-CM | POA: Diagnosis not present

## 2014-10-02 DIAGNOSIS — I10 Essential (primary) hypertension: Secondary | ICD-10-CM | POA: Diagnosis not present

## 2014-10-02 DIAGNOSIS — E669 Obesity, unspecified: Secondary | ICD-10-CM | POA: Diagnosis not present

## 2014-10-02 DIAGNOSIS — Z5181 Encounter for therapeutic drug level monitoring: Secondary | ICD-10-CM | POA: Diagnosis not present

## 2014-10-02 DIAGNOSIS — Z4822 Encounter for aftercare following kidney transplant: Secondary | ICD-10-CM | POA: Diagnosis not present

## 2014-10-02 DIAGNOSIS — Z6830 Body mass index (BMI) 30.0-30.9, adult: Secondary | ICD-10-CM | POA: Diagnosis not present

## 2014-10-02 DIAGNOSIS — N289 Disorder of kidney and ureter, unspecified: Secondary | ICD-10-CM | POA: Diagnosis not present

## 2014-10-02 DIAGNOSIS — Z79899 Other long term (current) drug therapy: Secondary | ICD-10-CM | POA: Diagnosis not present

## 2014-10-10 DIAGNOSIS — Z7982 Long term (current) use of aspirin: Secondary | ICD-10-CM | POA: Diagnosis not present

## 2014-10-10 DIAGNOSIS — E119 Type 2 diabetes mellitus without complications: Secondary | ICD-10-CM | POA: Diagnosis not present

## 2014-10-10 DIAGNOSIS — I120 Hypertensive chronic kidney disease with stage 5 chronic kidney disease or end stage renal disease: Secondary | ICD-10-CM | POA: Diagnosis not present

## 2014-10-10 DIAGNOSIS — Z7952 Long term (current) use of systemic steroids: Secondary | ICD-10-CM | POA: Diagnosis not present

## 2014-10-10 DIAGNOSIS — I158 Other secondary hypertension: Secondary | ICD-10-CM | POA: Diagnosis not present

## 2014-10-10 DIAGNOSIS — Z794 Long term (current) use of insulin: Secondary | ICD-10-CM | POA: Diagnosis not present

## 2014-10-10 DIAGNOSIS — Z79899 Other long term (current) drug therapy: Secondary | ICD-10-CM | POA: Diagnosis not present

## 2014-10-10 DIAGNOSIS — E669 Obesity, unspecified: Secondary | ICD-10-CM | POA: Diagnosis not present

## 2014-10-10 DIAGNOSIS — Z4822 Encounter for aftercare following kidney transplant: Secondary | ICD-10-CM | POA: Diagnosis not present

## 2014-10-10 DIAGNOSIS — Z940 Kidney transplant status: Secondary | ICD-10-CM | POA: Diagnosis not present

## 2014-10-10 DIAGNOSIS — D899 Disorder involving the immune mechanism, unspecified: Secondary | ICD-10-CM | POA: Diagnosis not present

## 2014-10-10 DIAGNOSIS — N186 End stage renal disease: Secondary | ICD-10-CM | POA: Diagnosis not present

## 2014-10-10 DIAGNOSIS — B259 Cytomegaloviral disease, unspecified: Secondary | ICD-10-CM | POA: Diagnosis not present

## 2014-10-10 DIAGNOSIS — E1122 Type 2 diabetes mellitus with diabetic chronic kidney disease: Secondary | ICD-10-CM | POA: Diagnosis not present

## 2014-10-10 DIAGNOSIS — I2510 Atherosclerotic heart disease of native coronary artery without angina pectoris: Secondary | ICD-10-CM | POA: Diagnosis not present

## 2014-10-10 DIAGNOSIS — E039 Hypothyroidism, unspecified: Secondary | ICD-10-CM | POA: Diagnosis not present

## 2014-10-10 DIAGNOSIS — Z792 Long term (current) use of antibiotics: Secondary | ICD-10-CM | POA: Diagnosis not present

## 2014-10-16 ENCOUNTER — Ambulatory Visit: Payer: PRIVATE HEALTH INSURANCE | Admitting: Endocrinology

## 2014-10-16 ENCOUNTER — Telehealth: Payer: Self-pay

## 2014-10-16 ENCOUNTER — Other Ambulatory Visit: Payer: Self-pay | Admitting: Endocrinology

## 2014-10-16 MED ORDER — GLUCOSE BLOOD VI STRP: 1.0000 | ORAL_STRIP | Freq: Two times a day (BID) | Status: DC

## 2014-10-16 MED ORDER — INSULIN NPH ISOPHANE & REGULAR (70-30) 100 UNIT/ML ~~LOC~~ SUSP: 35.0000 [IU] | Freq: Every day | SUBCUTANEOUS | Status: DC

## 2014-10-16 NOTE — Telephone Encounter (Signed)
Pt needs refill on test strips for the one touch verio tests 4 times daily call to [facility]

## 2014-10-16 NOTE — Telephone Encounter (Signed)
Pt called about her blood sugar readings and current insulin dosage. Pt stated she had forgot she had scheduled a appointment today at 1 pm. Pt stated her blood sugar has been consistently in the 200's and has not come down. Pt's stated her transplant Doctor has changed her insulin dose to Novolin 70 20 units in the morning 6 units in the evening. Also her testing frequency has been increased to 4 times per day. Pt is close to being out of insulin and test strips because of the new instructions. Please advise, what adjustments should be made. Thanks!

## 2014-10-16 NOTE — Telephone Encounter (Signed)
See note below and please advise if we can send for 4 times per day. Last office note states to check 2 times per day. Thanks!

## 2014-10-16 NOTE — Telephone Encounter (Signed)
Please increase insulin to 35 units qd, with breakfast, and none in the evening. Ov next available appt

## 2014-10-16 NOTE — Telephone Encounter (Signed)
Pt advised of note below and voiced understanding. Pt coming in for an appointment on 10/18/2014

## 2014-10-17 DIAGNOSIS — Z792 Long term (current) use of antibiotics: Secondary | ICD-10-CM | POA: Diagnosis not present

## 2014-10-17 DIAGNOSIS — D8989 Other specified disorders involving the immune mechanism, not elsewhere classified: Secondary | ICD-10-CM | POA: Diagnosis not present

## 2014-10-17 DIAGNOSIS — I120 Hypertensive chronic kidney disease with stage 5 chronic kidney disease or end stage renal disease: Secondary | ICD-10-CM | POA: Diagnosis not present

## 2014-10-17 DIAGNOSIS — N186 End stage renal disease: Secondary | ICD-10-CM | POA: Diagnosis not present

## 2014-10-17 DIAGNOSIS — Z79899 Other long term (current) drug therapy: Secondary | ICD-10-CM | POA: Diagnosis not present

## 2014-10-17 DIAGNOSIS — Z6830 Body mass index (BMI) 30.0-30.9, adult: Secondary | ICD-10-CM | POA: Diagnosis not present

## 2014-10-17 DIAGNOSIS — E039 Hypothyroidism, unspecified: Secondary | ICD-10-CM | POA: Diagnosis not present

## 2014-10-17 DIAGNOSIS — Z940 Kidney transplant status: Secondary | ICD-10-CM | POA: Diagnosis not present

## 2014-10-17 DIAGNOSIS — E119 Type 2 diabetes mellitus without complications: Secondary | ICD-10-CM | POA: Diagnosis not present

## 2014-10-17 DIAGNOSIS — Z794 Long term (current) use of insulin: Secondary | ICD-10-CM | POA: Diagnosis not present

## 2014-10-17 DIAGNOSIS — Z9861 Coronary angioplasty status: Secondary | ICD-10-CM | POA: Diagnosis not present

## 2014-10-17 DIAGNOSIS — E669 Obesity, unspecified: Secondary | ICD-10-CM | POA: Diagnosis not present

## 2014-10-17 DIAGNOSIS — Z7982 Long term (current) use of aspirin: Secondary | ICD-10-CM | POA: Diagnosis not present

## 2014-10-17 DIAGNOSIS — Z4822 Encounter for aftercare following kidney transplant: Secondary | ICD-10-CM | POA: Diagnosis not present

## 2014-10-17 DIAGNOSIS — E1121 Type 2 diabetes mellitus with diabetic nephropathy: Secondary | ICD-10-CM | POA: Diagnosis not present

## 2014-10-18 ENCOUNTER — Encounter: Payer: Self-pay | Admitting: Endocrinology

## 2014-10-18 ENCOUNTER — Ambulatory Visit (INDEPENDENT_AMBULATORY_CARE_PROVIDER_SITE_OTHER): Payer: Medicare Other | Admitting: Endocrinology

## 2014-10-18 VITALS — BP 143/87 | HR 94 | Temp 97.4°F | Ht 63.0 in | Wt 178.0 lb

## 2014-10-18 DIAGNOSIS — Z760 Encounter for issue of repeat prescription: Secondary | ICD-10-CM | POA: Diagnosis not present

## 2014-10-18 DIAGNOSIS — E1022 Type 1 diabetes mellitus with diabetic chronic kidney disease: Secondary | ICD-10-CM | POA: Diagnosis not present

## 2014-10-18 DIAGNOSIS — N189 Chronic kidney disease, unspecified: Secondary | ICD-10-CM

## 2014-10-18 DIAGNOSIS — Z4822 Encounter for aftercare following kidney transplant: Secondary | ICD-10-CM | POA: Diagnosis not present

## 2014-10-18 DIAGNOSIS — Z79899 Other long term (current) drug therapy: Secondary | ICD-10-CM | POA: Diagnosis not present

## 2014-10-18 NOTE — Progress Notes (Signed)
Subjective:    Patient ID: Sandy Roy, female    DOB: Aug 01, 1950, 64 y.o.   MRN: [id number]  HPI Pt returns for f/u of diabetes mellitus: DM type: insulin-requiring type 2 Dx'ed: 123XX123 Complications: CAD and renal failure.  Therapy: insulin since 2015.   GDM: never.   DKA: never Severe hypoglycemia: never.   Pancreatitis: never.   Other info: she wants an inexpensive and simple insulin regimen.  Interval history: She had renal transplantation 6 weeks ago.  Since then, cbg's have been higher.  She called, and we increased the insulin.  2 days ago, insulin was increased to 35 units qam.  She has run out of strips, so she does not know what cbg's are.  Past Medical History  Diagnosis Date  . CONGESTIVE HEART FAILURE 12/14/2006  . CORONARY ARTERY DISEASE 12/14/2006  . HYPERLIPIDEMIA 12/14/2006  . HYPERTENSION 12/14/2006  . Hypoparathyroidism 12/21/2006  . Hypopotassemia 01/13/2007  . HYPOTHYROIDISM, POSTSURGICAL 12/15/2006  . NEOPLASM, MALIGNANT, THYROID GLAND 12/15/2006    Stage 1 papillary adenocarcinoma, one very small focus each lobe (31mm, 22mm)  6/09: CT neck: no evidence of recurrence  . OBSTRUCTIVE SLEEP APNEA 02/23/2007    does not use a cpap  . DIABETES MELLITUS, TYPE II 12/14/2006    Type 2  . Depression     not recently  . Orbital fracture     left eye  . Shortness of breath dyspnea     with exertion  . Chronic kidney disease     ESRD-  Hemo- M_W_F    Past Surgical History  Procedure Laterality Date  . Thyroidectomy  2008  . Cholecystectomy    . Tubal ligation    . Ovary surgery    . Tonsillectomy    . Orbital fracture surgery    . Colonoscopy    . Breast surgery Left     nipple leaking, had surgery to fix the leak  . Cardiac catheterization      ? 10 years ago  . Bascilic vein transposition Right 02/07/2014    Procedure: FIRST STAGE BASILIC VEIN TRANSPOSITION;  Surgeon: Conrad [place], MD;  Location: [facility];  Service: Vascular;  Laterality: Right;  . Bascilic  vein transposition Right 05/30/2014    Procedure: SECOND STAGE BASILIC VEIN TRANSPOSITION;  Surgeon: Conrad [place], MD;  Location: [facility];  Service: Vascular;  Laterality: Right;    Social History   Social History  . Marital Status: Widowed    Spouse Name: N/A  . Number of Children: N/A  . Years of Education: N/A   Occupational History  . Not on file.   Social History Main Topics  . Smoking status: Former Smoker    Types: Cigarettes  . Smokeless tobacco: Never Used     Comment: "quit years ago"  . Alcohol Use: No  . Drug Use: No  . Sexual Activity: Not on file   Other Topics Concern  . Not on file   Social History Narrative    Current Outpatient Prescriptions on File Prior to Visit  Medication Sig Dispense Refill  . amLODipine (NORVASC) 10 MG tablet Take 0.5 mg by mouth daily. Take 1/2 tab    . aspirin (BAYER LOW STRENGTH) 81 MG EC tablet Take 81 mg by mouth daily.      Marland Kitchen atorvastatin (LIPITOR) 40 MG tablet     . BELSOMRA 10 MG TABS     . cloNIDine (CATAPRES) 0.2 MG tablet Take 0.1 mg by mouth daily.     Marland Kitchen  fish oil-omega-3 fatty acids 1000 MG capsule Take 1 capsule by mouth daily.      Marland Kitchen glucose blood (ONETOUCH VERIO) test strip 1 each by Other route 2 (two) times daily. And lancets 2/day E11.9 60 each 12  . insulin NPH-regular Human (NOVOLIN 70/30) (70-30) 100 UNIT/ML injection Inject 35 Units into the skin daily with breakfast. 10 mL 0  . Insulin Syringe-Needle U-100 (B-D INSULIN SYRINGE) 31G X 5/16" 0.3 ML MISC Use 1 time per day. 100 each 1  . levothyroxine (SYNTHROID, LEVOTHROID) 112 MCG tablet Take 1 tablet (112 mcg total) by mouth daily before breakfast. 30 tablet 5  . nitroGLYCERIN (NITROSTAT) 0.4 MG SL tablet Place 1 tablet (0.4 mg total) under the tongue every 5 (five) minutes as needed. 25 tablet 5  . oxyCODONE-acetaminophen (PERCOCET/ROXICET) 5-325 MG per tablet Take 1 tablet by mouth every 6 (six) hours as needed. 30 tablet 0  . triazolam (HALCION) 0.25 MG tablet  2 tablets by mouth at bedtime     . calcitRIOL (ROCALTROL) 0.25 MCG capsule     . calcium acetate (PHOSLO) 667 MG capsule Take 667-2,001 mg by mouth See admin instructions. Takes 2001 mg daily with meals and 667 mg with snacks    . calcium acetate (PHOSLO) 667 MG capsule     . doxazosin (CARDURA) 2 MG tablet Take 2 mg by mouth at bedtime.     . furosemide (LASIX) 80 MG tablet      No current facility-administered medications on file prior to visit.    No Known Allergies  Family History  Problem Relation Age of Onset  . Cancer Other   . COPD Other   . Hypertension Other   . Hyperlipidemia Other   . Stroke Other   . Heart disease Other   . Diabetes Other   . Heart failure Mother   . Diabetes Mother   . Cancer Father     BP 143/87 mmHg  Pulse 94  Temp(Src) 97.4 F (36.3 C) (Oral)  Ht 5\' 3"  (1.6 m)  Wt 178 lb (80.74 kg)  BMI 31.54 kg/m2  SpO2 95%   Review of Systems She denies hypoglycemia.     Objective:   Physical Exam VITAL SIGNS:  See vs page GENERAL: no distress Pulses: dorsalis pedis intact bilat.   MSK: no deformity of the feet CV: 1+ bilat leg edema Skin:  no ulcer on the feet.  normal color and temp on the feet. Neuro: sensation is intact to touch on the feet.      Assessment & Plan:  DM: control is worse due to improved renal function, and possibly due to steroids.    Patient is advised the following: Patient Instructions  check your blood sugar twice a day.  vary the time of day when you check, between before the 3 meals, and at bedtime.  also check if you have symptoms of your blood sugar being too high or too low.  please keep a record of the readings and bring it to your next appointment here.  You can write it on any piece of paper.  please call us sooner if your blood sugar goes below 70, or if you have a lot of readings over 200. Please come back for a follow-up appointment in 1 month.    On this type of insulin schedule, you should eat meals on  a regular schedule.  If a meal is missed or significantly delayed, your blood sugar could go low.  Here are  some strips, to use for now.  Please call us in 2 days, to tell us how the blood sugar is doing.   For now, please continue the same insulin.

## 2014-10-18 NOTE — Patient Instructions (Addendum)
check your blood sugar twice a day.  vary the time of day when you check, between before the 3 meals, and at bedtime.  also check if you have symptoms of your blood sugar being too high or too low.  please keep a record of the readings and bring it to your next appointment here.  You can write it on any piece of paper.  please call us sooner if your blood sugar goes below 70, or if you have a lot of readings over 200. Please come back for a follow-up appointment in 1 month.    On this type of insulin schedule, you should eat meals on a regular schedule.  If a meal is missed or significantly delayed, your blood sugar could go low.  Here are some strips, to use for now.  Please call us in 2 days, to tell us how the blood sugar is doing.   For now, please continue the same insulin.

## 2014-10-23 ENCOUNTER — Telehealth: Payer: Self-pay | Admitting: Endocrinology

## 2014-10-23 NOTE — Telephone Encounter (Signed)
Much better--good. Please continue the same insulin. Please come back for a follow-up appointment in 3 months

## 2014-10-23 NOTE — Telephone Encounter (Signed)
Pt called to report blood sugar readings.  8/11 Before Dinner: 181 Before Bed: 170  8/12:  Fasting 104: Before Bed: 185  8/13: Fasting: 98 Before Lunch: 134  8/14: Before Dinner: 191 Before Bed: 148  Pt is taking 35 units of novolin 70/30 before breakfast daily,  Please advise, Thanks!

## 2014-10-23 NOTE — Telephone Encounter (Signed)
Patient ask you to giver her a call, about her b/s

## 2014-10-23 NOTE — Telephone Encounter (Signed)
Pt advised of note below and voiced understanding. Pt scheduled for 3 month follow up.

## 2014-10-24 DIAGNOSIS — D899 Disorder involving the immune mechanism, unspecified: Secondary | ICD-10-CM | POA: Diagnosis not present

## 2014-10-24 DIAGNOSIS — I120 Hypertensive chronic kidney disease with stage 5 chronic kidney disease or end stage renal disease: Secondary | ICD-10-CM | POA: Diagnosis not present

## 2014-10-24 DIAGNOSIS — D8989 Other specified disorders involving the immune mechanism, not elsewhere classified: Secondary | ICD-10-CM | POA: Diagnosis not present

## 2014-10-24 DIAGNOSIS — Z79899 Other long term (current) drug therapy: Secondary | ICD-10-CM | POA: Diagnosis not present

## 2014-10-24 DIAGNOSIS — Z4822 Encounter for aftercare following kidney transplant: Secondary | ICD-10-CM | POA: Diagnosis not present

## 2014-10-24 DIAGNOSIS — E669 Obesity, unspecified: Secondary | ICD-10-CM | POA: Diagnosis not present

## 2014-10-24 DIAGNOSIS — D631 Anemia in chronic kidney disease: Secondary | ICD-10-CM | POA: Diagnosis not present

## 2014-10-24 DIAGNOSIS — Z7982 Long term (current) use of aspirin: Secondary | ICD-10-CM | POA: Diagnosis not present

## 2014-10-24 DIAGNOSIS — Z940 Kidney transplant status: Secondary | ICD-10-CM | POA: Diagnosis not present

## 2014-10-24 DIAGNOSIS — E785 Hyperlipidemia, unspecified: Secondary | ICD-10-CM | POA: Diagnosis not present

## 2014-10-24 DIAGNOSIS — N186 End stage renal disease: Secondary | ICD-10-CM | POA: Diagnosis not present

## 2014-10-24 DIAGNOSIS — E119 Type 2 diabetes mellitus without complications: Secondary | ICD-10-CM | POA: Diagnosis not present

## 2014-10-24 DIAGNOSIS — I151 Hypertension secondary to other renal disorders: Secondary | ICD-10-CM | POA: Diagnosis not present

## 2014-10-24 DIAGNOSIS — Z792 Long term (current) use of antibiotics: Secondary | ICD-10-CM | POA: Diagnosis not present

## 2014-10-24 DIAGNOSIS — N2889 Other specified disorders of kidney and ureter: Secondary | ICD-10-CM | POA: Diagnosis not present

## 2014-10-24 DIAGNOSIS — Z7952 Long term (current) use of systemic steroids: Secondary | ICD-10-CM | POA: Diagnosis not present

## 2014-10-26 DIAGNOSIS — Z940 Kidney transplant status: Secondary | ICD-10-CM | POA: Diagnosis not present

## 2014-10-26 DIAGNOSIS — R319 Hematuria, unspecified: Secondary | ICD-10-CM | POA: Diagnosis not present

## 2014-10-27 ENCOUNTER — Other Ambulatory Visit: Payer: Self-pay | Admitting: Endocrinology

## 2014-10-30 ENCOUNTER — Telehealth: Payer: Self-pay | Admitting: Endocrinology

## 2014-10-30 NOTE — Telephone Encounter (Signed)
Rx submitted on 10/30/2014.

## 2014-10-30 NOTE — Telephone Encounter (Signed)
[facility] note dated 10/27/14 5:28 PM Initial Comment Caller states that she would like her doctor call in her refill for Mylevothyroxin, and she only has two pills left and no refills. Nurse Assessment Nurse: Einar Gip, RN, Deborah Date/Time (Eastern Time): 10/27/2014 5:28:59 PM Confirm and document reason for call. If symptomatic, describe symptoms. ---Caller states she only has two of her levothyroxine left and needs a refill. Advised she will need to contact the office on Monday for the refill since she has enough for two days. Caller verbalized understanding.

## 2014-10-31 DIAGNOSIS — Z792 Long term (current) use of antibiotics: Secondary | ICD-10-CM | POA: Diagnosis not present

## 2014-10-31 DIAGNOSIS — N186 End stage renal disease: Secondary | ICD-10-CM | POA: Diagnosis not present

## 2014-10-31 DIAGNOSIS — Z4822 Encounter for aftercare following kidney transplant: Secondary | ICD-10-CM | POA: Diagnosis not present

## 2014-10-31 DIAGNOSIS — Z940 Kidney transplant status: Secondary | ICD-10-CM | POA: Diagnosis not present

## 2014-10-31 DIAGNOSIS — Z794 Long term (current) use of insulin: Secondary | ICD-10-CM | POA: Diagnosis not present

## 2014-10-31 DIAGNOSIS — I120 Hypertensive chronic kidney disease with stage 5 chronic kidney disease or end stage renal disease: Secondary | ICD-10-CM | POA: Diagnosis not present

## 2014-10-31 DIAGNOSIS — I151 Hypertension secondary to other renal disorders: Secondary | ICD-10-CM | POA: Diagnosis not present

## 2014-10-31 DIAGNOSIS — E039 Hypothyroidism, unspecified: Secondary | ICD-10-CM | POA: Diagnosis not present

## 2014-10-31 DIAGNOSIS — I2510 Atherosclerotic heart disease of native coronary artery without angina pectoris: Secondary | ICD-10-CM | POA: Diagnosis not present

## 2014-10-31 DIAGNOSIS — Z79899 Other long term (current) drug therapy: Secondary | ICD-10-CM | POA: Diagnosis not present

## 2014-10-31 DIAGNOSIS — E1122 Type 2 diabetes mellitus with diabetic chronic kidney disease: Secondary | ICD-10-CM | POA: Diagnosis not present

## 2014-10-31 DIAGNOSIS — N2889 Other specified disorders of kidney and ureter: Secondary | ICD-10-CM | POA: Diagnosis not present

## 2014-10-31 DIAGNOSIS — Z6832 Body mass index (BMI) 32.0-32.9, adult: Secondary | ICD-10-CM | POA: Diagnosis not present

## 2014-10-31 DIAGNOSIS — D899 Disorder involving the immune mechanism, unspecified: Secondary | ICD-10-CM | POA: Diagnosis not present

## 2014-10-31 DIAGNOSIS — Z7982 Long term (current) use of aspirin: Secondary | ICD-10-CM | POA: Diagnosis not present

## 2014-11-07 DIAGNOSIS — Z940 Kidney transplant status: Secondary | ICD-10-CM | POA: Diagnosis not present

## 2014-11-07 DIAGNOSIS — Z951 Presence of aortocoronary bypass graft: Secondary | ICD-10-CM | POA: Diagnosis not present

## 2014-11-07 DIAGNOSIS — N186 End stage renal disease: Secondary | ICD-10-CM | POA: Diagnosis not present

## 2014-11-07 DIAGNOSIS — E1122 Type 2 diabetes mellitus with diabetic chronic kidney disease: Secondary | ICD-10-CM | POA: Diagnosis not present

## 2014-11-07 DIAGNOSIS — Z7952 Long term (current) use of systemic steroids: Secondary | ICD-10-CM | POA: Diagnosis not present

## 2014-11-07 DIAGNOSIS — I120 Hypertensive chronic kidney disease with stage 5 chronic kidney disease or end stage renal disease: Secondary | ICD-10-CM | POA: Diagnosis not present

## 2014-11-07 DIAGNOSIS — Z4822 Encounter for aftercare following kidney transplant: Secondary | ICD-10-CM | POA: Diagnosis not present

## 2014-11-07 DIAGNOSIS — D899 Disorder involving the immune mechanism, unspecified: Secondary | ICD-10-CM | POA: Diagnosis not present

## 2014-11-07 DIAGNOSIS — Z79899 Other long term (current) drug therapy: Secondary | ICD-10-CM | POA: Diagnosis not present

## 2014-11-07 DIAGNOSIS — I2510 Atherosclerotic heart disease of native coronary artery without angina pectoris: Secondary | ICD-10-CM | POA: Diagnosis not present

## 2014-11-07 DIAGNOSIS — Z7982 Long term (current) use of aspirin: Secondary | ICD-10-CM | POA: Diagnosis not present

## 2014-11-07 DIAGNOSIS — B349 Viral infection, unspecified: Secondary | ICD-10-CM | POA: Diagnosis not present

## 2014-11-07 DIAGNOSIS — E039 Hypothyroidism, unspecified: Secondary | ICD-10-CM | POA: Diagnosis not present

## 2014-11-07 DIAGNOSIS — Z794 Long term (current) use of insulin: Secondary | ICD-10-CM | POA: Diagnosis not present

## 2014-11-07 DIAGNOSIS — N2889 Other specified disorders of kidney and ureter: Secondary | ICD-10-CM | POA: Diagnosis not present

## 2014-11-07 DIAGNOSIS — I151 Hypertension secondary to other renal disorders: Secondary | ICD-10-CM | POA: Diagnosis not present

## 2014-11-14 DIAGNOSIS — Z794 Long term (current) use of insulin: Secondary | ICD-10-CM | POA: Diagnosis not present

## 2014-11-14 DIAGNOSIS — B349 Viral infection, unspecified: Secondary | ICD-10-CM | POA: Diagnosis not present

## 2014-11-14 DIAGNOSIS — I2510 Atherosclerotic heart disease of native coronary artery without angina pectoris: Secondary | ICD-10-CM | POA: Diagnosis not present

## 2014-11-14 DIAGNOSIS — E119 Type 2 diabetes mellitus without complications: Secondary | ICD-10-CM | POA: Diagnosis not present

## 2014-11-14 DIAGNOSIS — I151 Hypertension secondary to other renal disorders: Secondary | ICD-10-CM | POA: Diagnosis not present

## 2014-11-14 DIAGNOSIS — N2889 Other specified disorders of kidney and ureter: Secondary | ICD-10-CM | POA: Diagnosis not present

## 2014-11-14 DIAGNOSIS — Z7982 Long term (current) use of aspirin: Secondary | ICD-10-CM | POA: Diagnosis not present

## 2014-11-14 DIAGNOSIS — E039 Hypothyroidism, unspecified: Secondary | ICD-10-CM | POA: Diagnosis not present

## 2014-11-14 DIAGNOSIS — D899 Disorder involving the immune mechanism, unspecified: Secondary | ICD-10-CM | POA: Diagnosis not present

## 2014-11-14 DIAGNOSIS — N186 End stage renal disease: Secondary | ICD-10-CM | POA: Diagnosis not present

## 2014-11-14 DIAGNOSIS — Z4822 Encounter for aftercare following kidney transplant: Secondary | ICD-10-CM | POA: Diagnosis not present

## 2014-11-14 DIAGNOSIS — Z9861 Coronary angioplasty status: Secondary | ICD-10-CM | POA: Diagnosis not present

## 2014-11-14 DIAGNOSIS — I120 Hypertensive chronic kidney disease with stage 5 chronic kidney disease or end stage renal disease: Secondary | ICD-10-CM | POA: Diagnosis not present

## 2014-11-14 DIAGNOSIS — E1122 Type 2 diabetes mellitus with diabetic chronic kidney disease: Secondary | ICD-10-CM | POA: Diagnosis not present

## 2014-11-14 DIAGNOSIS — Z79899 Other long term (current) drug therapy: Secondary | ICD-10-CM | POA: Diagnosis not present

## 2014-11-14 DIAGNOSIS — Z940 Kidney transplant status: Secondary | ICD-10-CM | POA: Diagnosis not present

## 2014-11-14 DIAGNOSIS — E669 Obesity, unspecified: Secondary | ICD-10-CM | POA: Diagnosis not present

## 2014-11-17 ENCOUNTER — Ambulatory Visit: Payer: PRIVATE HEALTH INSURANCE | Admitting: Endocrinology

## 2014-11-17 ENCOUNTER — Telehealth: Payer: Self-pay | Admitting: Endocrinology

## 2014-11-17 MED ORDER — INSULIN NPH ISOPHANE & REGULAR (70-30) 100 UNIT/ML ~~LOC~~ SUSP: 35.0000 [IU] | Freq: Every day | SUBCUTANEOUS | Status: DC

## 2014-11-17 NOTE — Telephone Encounter (Signed)
Patient need a refill of insulin NPH-regular Human (NOVOLIN 70/30) (70-30) 100 UNIT/ML injection.

## 2014-11-17 NOTE — Telephone Encounter (Signed)
Rx sent per pt's request.  

## 2014-11-21 DIAGNOSIS — Z4822 Encounter for aftercare following kidney transplant: Secondary | ICD-10-CM | POA: Diagnosis not present

## 2014-11-21 DIAGNOSIS — Z940 Kidney transplant status: Secondary | ICD-10-CM | POA: Diagnosis not present

## 2014-11-22 ENCOUNTER — Other Ambulatory Visit: Payer: Self-pay | Admitting: Endocrinology

## 2014-11-23 DIAGNOSIS — T8619 Other complication of kidney transplant: Secondary | ICD-10-CM | POA: Diagnosis not present

## 2014-11-23 DIAGNOSIS — Z7952 Long term (current) use of systemic steroids: Secondary | ICD-10-CM | POA: Diagnosis not present

## 2014-11-23 DIAGNOSIS — Z79899 Other long term (current) drug therapy: Secondary | ICD-10-CM | POA: Diagnosis not present

## 2014-11-23 DIAGNOSIS — I151 Hypertension secondary to other renal disorders: Secondary | ICD-10-CM | POA: Diagnosis not present

## 2014-11-23 DIAGNOSIS — E039 Hypothyroidism, unspecified: Secondary | ICD-10-CM | POA: Diagnosis not present

## 2014-11-23 DIAGNOSIS — R6520 Severe sepsis without septic shock: Secondary | ICD-10-CM | POA: Diagnosis not present

## 2014-11-23 DIAGNOSIS — E669 Obesity, unspecified: Secondary | ICD-10-CM | POA: Diagnosis not present

## 2014-11-23 DIAGNOSIS — D899 Disorder involving the immune mechanism, unspecified: Secondary | ICD-10-CM | POA: Diagnosis not present

## 2014-11-23 DIAGNOSIS — Z940 Kidney transplant status: Secondary | ICD-10-CM | POA: Diagnosis not present

## 2014-11-23 DIAGNOSIS — Z7982 Long term (current) use of aspirin: Secondary | ICD-10-CM | POA: Diagnosis not present

## 2014-11-23 DIAGNOSIS — N2889 Other specified disorders of kidney and ureter: Secondary | ICD-10-CM | POA: Diagnosis not present

## 2014-11-23 DIAGNOSIS — E119 Type 2 diabetes mellitus without complications: Secondary | ICD-10-CM | POA: Diagnosis not present

## 2014-11-23 DIAGNOSIS — B349 Viral infection, unspecified: Secondary | ICD-10-CM | POA: Diagnosis not present

## 2014-11-23 DIAGNOSIS — E785 Hyperlipidemia, unspecified: Secondary | ICD-10-CM | POA: Diagnosis not present

## 2014-11-23 DIAGNOSIS — Z4822 Encounter for aftercare following kidney transplant: Secondary | ICD-10-CM | POA: Diagnosis not present

## 2014-11-23 DIAGNOSIS — Z792 Long term (current) use of antibiotics: Secondary | ICD-10-CM | POA: Diagnosis not present

## 2014-11-23 DIAGNOSIS — I120 Hypertensive chronic kidney disease with stage 5 chronic kidney disease or end stage renal disease: Secondary | ICD-10-CM | POA: Diagnosis not present

## 2014-11-23 DIAGNOSIS — N186 End stage renal disease: Secondary | ICD-10-CM | POA: Diagnosis not present

## 2014-11-23 DIAGNOSIS — E1122 Type 2 diabetes mellitus with diabetic chronic kidney disease: Secondary | ICD-10-CM | POA: Diagnosis not present

## 2014-11-28 DIAGNOSIS — E039 Hypothyroidism, unspecified: Secondary | ICD-10-CM | POA: Diagnosis not present

## 2014-11-28 DIAGNOSIS — Z792 Long term (current) use of antibiotics: Secondary | ICD-10-CM | POA: Diagnosis not present

## 2014-11-28 DIAGNOSIS — N186 End stage renal disease: Secondary | ICD-10-CM | POA: Diagnosis not present

## 2014-11-28 DIAGNOSIS — I120 Hypertensive chronic kidney disease with stage 5 chronic kidney disease or end stage renal disease: Secondary | ICD-10-CM | POA: Diagnosis not present

## 2014-11-28 DIAGNOSIS — Z6833 Body mass index (BMI) 33.0-33.9, adult: Secondary | ICD-10-CM | POA: Diagnosis not present

## 2014-11-28 DIAGNOSIS — Z940 Kidney transplant status: Secondary | ICD-10-CM | POA: Diagnosis not present

## 2014-11-28 DIAGNOSIS — Z4822 Encounter for aftercare following kidney transplant: Secondary | ICD-10-CM | POA: Diagnosis not present

## 2014-11-28 DIAGNOSIS — Z79899 Other long term (current) drug therapy: Secondary | ICD-10-CM | POA: Diagnosis not present

## 2014-11-28 DIAGNOSIS — E1122 Type 2 diabetes mellitus with diabetic chronic kidney disease: Secondary | ICD-10-CM | POA: Diagnosis not present

## 2014-11-28 DIAGNOSIS — E119 Type 2 diabetes mellitus without complications: Secondary | ICD-10-CM | POA: Diagnosis not present

## 2014-11-28 DIAGNOSIS — Z7982 Long term (current) use of aspirin: Secondary | ICD-10-CM | POA: Diagnosis not present

## 2014-11-28 DIAGNOSIS — R635 Abnormal weight gain: Secondary | ICD-10-CM | POA: Diagnosis not present

## 2014-11-28 DIAGNOSIS — Z7952 Long term (current) use of systemic steroids: Secondary | ICD-10-CM | POA: Diagnosis not present

## 2014-11-28 DIAGNOSIS — Z794 Long term (current) use of insulin: Secondary | ICD-10-CM | POA: Diagnosis not present

## 2014-11-28 DIAGNOSIS — B349 Viral infection, unspecified: Secondary | ICD-10-CM | POA: Diagnosis not present

## 2014-11-28 DIAGNOSIS — D899 Disorder involving the immune mechanism, unspecified: Secondary | ICD-10-CM | POA: Diagnosis not present

## 2014-11-28 DIAGNOSIS — I2510 Atherosclerotic heart disease of native coronary artery without angina pectoris: Secondary | ICD-10-CM | POA: Diagnosis not present

## 2014-11-28 DIAGNOSIS — E669 Obesity, unspecified: Secondary | ICD-10-CM | POA: Diagnosis not present

## 2014-12-05 DIAGNOSIS — Z5181 Encounter for therapeutic drug level monitoring: Secondary | ICD-10-CM | POA: Diagnosis not present

## 2014-12-05 DIAGNOSIS — Z940 Kidney transplant status: Secondary | ICD-10-CM | POA: Diagnosis not present

## 2014-12-12 DIAGNOSIS — Z794 Long term (current) use of insulin: Secondary | ICD-10-CM | POA: Diagnosis not present

## 2014-12-12 DIAGNOSIS — Z85850 Personal history of malignant neoplasm of thyroid: Secondary | ICD-10-CM | POA: Diagnosis not present

## 2014-12-12 DIAGNOSIS — Z940 Kidney transplant status: Secondary | ICD-10-CM | POA: Diagnosis not present

## 2014-12-12 DIAGNOSIS — Z7982 Long term (current) use of aspirin: Secondary | ICD-10-CM | POA: Diagnosis not present

## 2014-12-12 DIAGNOSIS — Z6839 Body mass index (BMI) 39.0-39.9, adult: Secondary | ICD-10-CM | POA: Diagnosis not present

## 2014-12-12 DIAGNOSIS — E118 Type 2 diabetes mellitus with unspecified complications: Secondary | ICD-10-CM | POA: Diagnosis not present

## 2014-12-12 DIAGNOSIS — D803 Selective deficiency of immunoglobulin G [IgG] subclasses: Secondary | ICD-10-CM | POA: Diagnosis not present

## 2014-12-12 DIAGNOSIS — B349 Viral infection, unspecified: Secondary | ICD-10-CM | POA: Diagnosis not present

## 2014-12-12 DIAGNOSIS — I10 Essential (primary) hypertension: Secondary | ICD-10-CM | POA: Diagnosis not present

## 2014-12-12 DIAGNOSIS — Z79899 Other long term (current) drug therapy: Secondary | ICD-10-CM | POA: Diagnosis not present

## 2014-12-12 DIAGNOSIS — E785 Hyperlipidemia, unspecified: Secondary | ICD-10-CM | POA: Diagnosis not present

## 2014-12-12 DIAGNOSIS — E669 Obesity, unspecified: Secondary | ICD-10-CM | POA: Diagnosis not present

## 2014-12-22 ENCOUNTER — Telehealth: Payer: Self-pay | Admitting: Endocrinology

## 2014-12-22 NOTE — Telephone Encounter (Signed)
Sandy Roy pt   Pt needs [place] to call in levothyroxine [facility] [phone] will be out on 12/29/14  Pt asking if we can start to call in her medications as a 90 day supply

## 2014-12-22 NOTE — Telephone Encounter (Signed)
Is it okay to refill this at a 90 day supply?

## 2014-12-24 NOTE — Telephone Encounter (Signed)
Yes, please refill prn, 90 days at a time

## 2014-12-25 ENCOUNTER — Other Ambulatory Visit: Payer: Self-pay

## 2014-12-25 MED ORDER — LEVOTHYROXINE SODIUM 112 MCG PO TABS: 112.0000 ug | ORAL_TABLET | Freq: Every day | ORAL | Status: DC

## 2014-12-26 DIAGNOSIS — Z794 Long term (current) use of insulin: Secondary | ICD-10-CM | POA: Diagnosis not present

## 2014-12-26 DIAGNOSIS — I120 Hypertensive chronic kidney disease with stage 5 chronic kidney disease or end stage renal disease: Secondary | ICD-10-CM | POA: Diagnosis not present

## 2014-12-26 DIAGNOSIS — Z79899 Other long term (current) drug therapy: Secondary | ICD-10-CM | POA: Diagnosis not present

## 2014-12-26 DIAGNOSIS — Z23 Encounter for immunization: Secondary | ICD-10-CM | POA: Diagnosis not present

## 2014-12-26 DIAGNOSIS — E039 Hypothyroidism, unspecified: Secondary | ICD-10-CM | POA: Diagnosis not present

## 2014-12-26 DIAGNOSIS — Z940 Kidney transplant status: Secondary | ICD-10-CM | POA: Diagnosis not present

## 2014-12-26 DIAGNOSIS — I150 Renovascular hypertension: Secondary | ICD-10-CM | POA: Diagnosis not present

## 2014-12-26 DIAGNOSIS — E1122 Type 2 diabetes mellitus with diabetic chronic kidney disease: Secondary | ICD-10-CM | POA: Diagnosis not present

## 2014-12-26 DIAGNOSIS — Z4822 Encounter for aftercare following kidney transplant: Secondary | ICD-10-CM | POA: Diagnosis not present

## 2014-12-26 DIAGNOSIS — R635 Abnormal weight gain: Secondary | ICD-10-CM | POA: Diagnosis not present

## 2014-12-26 DIAGNOSIS — I2510 Atherosclerotic heart disease of native coronary artery without angina pectoris: Secondary | ICD-10-CM | POA: Diagnosis not present

## 2014-12-26 DIAGNOSIS — N186 End stage renal disease: Secondary | ICD-10-CM | POA: Diagnosis not present

## 2014-12-26 DIAGNOSIS — Z7982 Long term (current) use of aspirin: Secondary | ICD-10-CM | POA: Diagnosis not present

## 2014-12-26 DIAGNOSIS — D899 Disorder involving the immune mechanism, unspecified: Secondary | ICD-10-CM | POA: Diagnosis not present

## 2014-12-26 DIAGNOSIS — Z792 Long term (current) use of antibiotics: Secondary | ICD-10-CM | POA: Diagnosis not present

## 2014-12-26 DIAGNOSIS — Z6833 Body mass index (BMI) 33.0-33.9, adult: Secondary | ICD-10-CM | POA: Diagnosis not present

## 2014-12-26 DIAGNOSIS — B349 Viral infection, unspecified: Secondary | ICD-10-CM | POA: Diagnosis not present

## 2014-12-27 ENCOUNTER — Other Ambulatory Visit: Payer: Self-pay | Admitting: Endocrinology

## 2014-12-28 DIAGNOSIS — I2510 Atherosclerotic heart disease of native coronary artery without angina pectoris: Secondary | ICD-10-CM | POA: Diagnosis not present

## 2014-12-28 DIAGNOSIS — I120 Hypertensive chronic kidney disease with stage 5 chronic kidney disease or end stage renal disease: Secondary | ICD-10-CM | POA: Diagnosis not present

## 2014-12-28 DIAGNOSIS — E039 Hypothyroidism, unspecified: Secondary | ICD-10-CM | POA: Diagnosis not present

## 2014-12-28 DIAGNOSIS — E1122 Type 2 diabetes mellitus with diabetic chronic kidney disease: Secondary | ICD-10-CM | POA: Diagnosis not present

## 2014-12-28 DIAGNOSIS — F3340 Major depressive disorder, recurrent, in remission, unspecified: Secondary | ICD-10-CM | POA: Diagnosis not present

## 2014-12-28 DIAGNOSIS — G4700 Insomnia, unspecified: Secondary | ICD-10-CM | POA: Diagnosis not present

## 2014-12-28 DIAGNOSIS — E782 Mixed hyperlipidemia: Secondary | ICD-10-CM | POA: Diagnosis not present

## 2014-12-28 DIAGNOSIS — Z940 Kidney transplant status: Secondary | ICD-10-CM | POA: Diagnosis not present

## 2015-01-02 DIAGNOSIS — Z940 Kidney transplant status: Secondary | ICD-10-CM | POA: Diagnosis not present

## 2015-01-15 ENCOUNTER — Telehealth: Payer: Self-pay | Admitting: Endocrinology

## 2015-01-15 NOTE — Telephone Encounter (Signed)
Patient scheduled for 01/17/2015 at 9 am.

## 2015-01-15 NOTE — Telephone Encounter (Signed)
Patient stated that her b/s is out of control over over 200, please advise

## 2015-01-15 NOTE — Telephone Encounter (Signed)
See note below and please advise, Thanks! 

## 2015-01-15 NOTE — Telephone Encounter (Signed)
Please move up next ov to this week

## 2015-01-16 DIAGNOSIS — Z940 Kidney transplant status: Secondary | ICD-10-CM | POA: Diagnosis not present

## 2015-01-16 DIAGNOSIS — I2510 Atherosclerotic heart disease of native coronary artery without angina pectoris: Secondary | ICD-10-CM | POA: Diagnosis not present

## 2015-01-16 DIAGNOSIS — Z79899 Other long term (current) drug therapy: Secondary | ICD-10-CM | POA: Diagnosis not present

## 2015-01-16 DIAGNOSIS — Z7952 Long term (current) use of systemic steroids: Secondary | ICD-10-CM | POA: Diagnosis not present

## 2015-01-16 DIAGNOSIS — Z7982 Long term (current) use of aspirin: Secondary | ICD-10-CM | POA: Diagnosis not present

## 2015-01-16 DIAGNOSIS — E1122 Type 2 diabetes mellitus with diabetic chronic kidney disease: Secondary | ICD-10-CM | POA: Diagnosis not present

## 2015-01-16 DIAGNOSIS — B349 Viral infection, unspecified: Secondary | ICD-10-CM | POA: Diagnosis not present

## 2015-01-16 DIAGNOSIS — N186 End stage renal disease: Secondary | ICD-10-CM | POA: Diagnosis not present

## 2015-01-16 DIAGNOSIS — I150 Renovascular hypertension: Secondary | ICD-10-CM | POA: Diagnosis not present

## 2015-01-16 DIAGNOSIS — R635 Abnormal weight gain: Secondary | ICD-10-CM | POA: Diagnosis not present

## 2015-01-16 DIAGNOSIS — Z6833 Body mass index (BMI) 33.0-33.9, adult: Secondary | ICD-10-CM | POA: Diagnosis not present

## 2015-01-16 DIAGNOSIS — D899 Disorder involving the immune mechanism, unspecified: Secondary | ICD-10-CM | POA: Diagnosis not present

## 2015-01-16 DIAGNOSIS — I120 Hypertensive chronic kidney disease with stage 5 chronic kidney disease or end stage renal disease: Secondary | ICD-10-CM | POA: Diagnosis not present

## 2015-01-16 DIAGNOSIS — Z4822 Encounter for aftercare following kidney transplant: Secondary | ICD-10-CM | POA: Diagnosis not present

## 2015-01-16 DIAGNOSIS — E039 Hypothyroidism, unspecified: Secondary | ICD-10-CM | POA: Diagnosis not present

## 2015-01-16 DIAGNOSIS — Z794 Long term (current) use of insulin: Secondary | ICD-10-CM | POA: Diagnosis not present

## 2015-01-17 ENCOUNTER — Ambulatory Visit (INDEPENDENT_AMBULATORY_CARE_PROVIDER_SITE_OTHER): Payer: Medicare Other | Admitting: Endocrinology

## 2015-01-17 ENCOUNTER — Encounter: Payer: Self-pay | Admitting: Endocrinology

## 2015-01-17 VITALS — BP 118/74 | HR 79 | Temp 98.1°F | Ht 60.0 in | Wt 189.0 lb

## 2015-01-17 DIAGNOSIS — N189 Chronic kidney disease, unspecified: Secondary | ICD-10-CM

## 2015-01-17 DIAGNOSIS — E1022 Type 1 diabetes mellitus with diabetic chronic kidney disease: Secondary | ICD-10-CM

## 2015-01-17 DIAGNOSIS — E785 Hyperlipidemia, unspecified: Secondary | ICD-10-CM

## 2015-01-17 LAB — LIPID PANEL
Cholesterol: 400 mg/dL — ABNORMAL HIGH (ref 0–200)
HDL: 64.9 mg/dL (ref >39.00)
NonHDL: 335.19
Total CHOL/HDL Ratio: 6
Triglycerides: 246 mg/dL — ABNORMAL HIGH (ref 0.0–149.0)
VLDL: 49.2 mg/dL — ABNORMAL HIGH (ref 0.0–40.0)

## 2015-01-17 LAB — POCT GLYCOSYLATED HEMOGLOBIN (HGB A1C): HEMOGLOBIN A1C: 8

## 2015-01-17 LAB — LDL CHOLESTEROL, DIRECT: LDL DIRECT: 273 mg/dL

## 2015-01-17 MED ORDER — INSULIN NPH ISOPHANE & REGULAR (70-30) 100 UNIT/ML ~~LOC~~ SUSP: 40.0000 [IU] | Freq: Every day | SUBCUTANEOUS | Status: DC

## 2015-01-17 NOTE — Progress Notes (Signed)
Subjective:    Patient ID: Sandy Roy, female    DOB: 31-Jan-1951, 64 y.o.   MRN: [id number]  HPI Pt returns for f/u of diabetes mellitus: DM type: insulin-requiring type 2 Dx'ed: 123XX123 Complications: CAD and renal failure.  Therapy: insulin since 2015.   GDM: never.   DKA: never Severe hypoglycemia: never.   Pancreatitis: never.   Other info: she wants a QD, inexpensive insulin regimen.  Interval history: she brings a record of her cbg's which i have reviewed today.  It varies from 105-300, but most are in the mid-100's.  There is no trend throughout the day.  Past Medical History  Diagnosis Date  . CONGESTIVE HEART FAILURE 12/14/2006  . CORONARY ARTERY DISEASE 12/14/2006  . HYPERLIPIDEMIA 12/14/2006  . HYPERTENSION 12/14/2006  . Hypoparathyroidism ([place]) 12/21/2006  . Hypopotassemia 01/13/2007  . HYPOTHYROIDISM, POSTSURGICAL 12/15/2006  . NEOPLASM, MALIGNANT, THYROID GLAND 12/15/2006    Stage 1 papillary adenocarcinoma, one very small focus each lobe (63mm, 71mm)  6/09: CT neck: no evidence of recurrence  . OBSTRUCTIVE SLEEP APNEA 02/23/2007    does not use a cpap  . DIABETES MELLITUS, TYPE II 12/14/2006    Type 2  . Depression     not recently  . Orbital fracture ([facility])     left eye  . Shortness of breath dyspnea     with exertion  . Chronic kidney disease     ESRD-  Hemo- M_W_F    Past Surgical History  Procedure Laterality Date  . Thyroidectomy  2008  . Cholecystectomy    . Tubal ligation    . Ovary surgery    . Tonsillectomy    . Orbital fracture surgery    . Colonoscopy    . Breast surgery Left     nipple leaking, had surgery to fix the leak  . Cardiac catheterization      ? 10 years ago  . Bascilic vein transposition Right 02/07/2014    Procedure: FIRST STAGE BASILIC VEIN TRANSPOSITION;  Surgeon: Conrad Pinellas, MD;  Location: [facility];  Service: Vascular;  Laterality: Right;  . Bascilic vein transposition Right 05/30/2014    Procedure: SECOND STAGE BASILIC VEIN  TRANSPOSITION;  Surgeon: Conrad Lucky, MD;  Location: [facility];  Service: Vascular;  Laterality: Right;    Social History   Social History  . Marital Status: Widowed    Spouse Name: N/A  . Number of Children: N/A  . Years of Education: N/A   Occupational History  . Not on file.   Social History Main Topics  . Smoking status: Former Smoker    Types: Cigarettes  . Smokeless tobacco: Never Used     Comment: "quit years ago"  . Alcohol Use: No  . Drug Use: No  . Sexual Activity: Not on file   Other Topics Concern  . Not on file   Social History Narrative    Current Outpatient Prescriptions on File Prior to Visit  Medication Sig Dispense Refill  . amLODipine (NORVASC) 10 MG tablet Take 0.5 mg by mouth daily. Take 1/2 tab    . aspirin (BAYER LOW STRENGTH) 81 MG EC tablet Take 81 mg by mouth daily.      Marland Kitchen atorvastatin (LIPITOR) 40 MG tablet     . BELSOMRA 10 MG TABS     . calcitRIOL (ROCALTROL) 0.25 MCG capsule     . calcium acetate (PHOSLO) 667 MG capsule Take 667-2,001 mg by mouth See admin instructions. Takes 2001 mg  daily with meals and 667 mg with snacks    . calcium acetate (PHOSLO) 667 MG capsule     . cloNIDine (CATAPRES) 0.2 MG tablet Take 0.1 mg by mouth daily.     Marland Kitchen doxazosin (CARDURA) 2 MG tablet Take 2 mg by mouth at bedtime.     . fish oil-omega-3 fatty acids 1000 MG capsule Take 1 capsule by mouth daily.      . furosemide (LASIX) 80 MG tablet     . glucose blood (ONETOUCH VERIO) test strip 1 each by Other route 2 (two) times daily. And lancets 2/day E11.9 60 each 12  . Insulin Syringe-Needle U-100 (B-D INSULIN SYRINGE) 31G X 5/16" 0.3 ML MISC Use 1 time per day. 100 each 1  . levothyroxine (SYNTHROID, LEVOTHROID) 112 MCG tablet Take 1 tablet (112 mcg total) by mouth daily before breakfast. 90 tablet 0  . levothyroxine (SYNTHROID, LEVOTHROID) 112 MCG tablet TAKE ONE TABLET BY MOUTH ONCE DAILY BEFORE BREAKFAST 30 tablet 1  . Multiple Vitamins-Minerals (MULTIVITAMIN  WITH MINERALS) tablet Take 1 tablet by mouth daily.    . mycophenolate (MYFORTIC) 180 MG EC tablet Take 180 mg by mouth 2 (two) times daily.    . nitroGLYCERIN (NITROSTAT) 0.4 MG SL tablet Place 1 tablet (0.4 mg total) under the tongue every 5 (five) minutes as needed. 25 tablet 5  . omeprazole (PRILOSEC) 20 MG capsule Take 20 mg by mouth daily.    Marland Kitchen oxyCODONE-acetaminophen (PERCOCET/ROXICET) 5-325 MG per tablet Take 1 tablet by mouth every 6 (six) hours as needed. 30 tablet 0  . sulfamethoxazole-trimethoprim (BACTRIM,SEPTRA) 400-80 MG per tablet 1 tablet. On Monday Wednesday and Friday    . tacrolimus (PROGRAF) 1 MG capsule Take 1 mg by mouth. 6 tabs in the morning 6 tabs in the evening    . triazolam (HALCION) 0.25 MG tablet 2 tablets by mouth at bedtime     . valGANciclovir (VALCYTE) 450 MG tablet Take 450 mg by mouth daily. Take on Monday Wednesday and Friday.     No current facility-administered medications on file prior to visit.    No Known Allergies  Family History  Problem Relation Age of Onset  . Cancer Other   . COPD Other   . Hypertension Other   . Hyperlipidemia Other   . Stroke Other   . Heart disease Other   . Diabetes Other   . Heart failure Mother   . Diabetes Mother   . Cancer Father     BP 118/74 mmHg  Pulse 79  Temp(Src) 98.1 F (36.7 C) (Oral)  Ht 5' (1.524 m)  Wt 189 lb (85.73 kg)  BMI 36.91 kg/m2  SpO2 94%  Review of Systems She denies hypoglycemia    Objective:   Physical Exam VITAL SIGNS:  See vs page GENERAL: no distress Pulses: dorsalis pedis intact bilat.   MSK: no deformity of the feet. CV: no leg edema.   Skin:  no ulcer on the feet.  normal color and temp on the feet. Neuro: sensation is intact to touch on the feet.   Lab Results  Component Value Date   HGBA1C 8.0 01/17/2015       Assessment & Plan:  DM: she needs increased rx.   Patient is advised the following: Patient Instructions  check your blood sugar twice a day.   vary the time of day when you check, between before the 3 meals, and at bedtime.  also check if you have symptoms of your blood  sugar being too high or too low.  please keep a record of the readings and bring it to your next appointment here.  You can write it on any piece of paper.  please call us sooner if your blood sugar goes below 70, or if you have a lot of readings over 200. Please come back for a follow-up appointment in 3 months.   blood tests are requested for you today.  We'll let you know about the results. On this type of insulin schedule, you should eat meals on a regular schedule.  If a meal is missed or significantly delayed, your blood sugar could go low.  Please increase the insulin to 40 units each morning.

## 2015-01-17 NOTE — Patient Instructions (Addendum)
check your blood sugar twice a day.  vary the time of day when you check, between before the 3 meals, and at bedtime.  also check if you have symptoms of your blood sugar being too high or too low.  please keep a record of the readings and bring it to your next appointment here.  You can write it on any piece of paper.  please call us sooner if your blood sugar goes below 70, or if you have a lot of readings over 200. Please come back for a follow-up appointment in 3 months.   blood tests are requested for you today.  We'll let you know about the results. On this type of insulin schedule, you should eat meals on a regular schedule.  If a meal is missed or significantly delayed, your blood sugar could go low.  Please increase the insulin to 40 units each morning.

## 2015-01-26 ENCOUNTER — Ambulatory Visit: Payer: PRIVATE HEALTH INSURANCE | Admitting: Endocrinology

## 2015-01-29 DIAGNOSIS — Z4822 Encounter for aftercare following kidney transplant: Secondary | ICD-10-CM | POA: Diagnosis not present

## 2015-01-29 DIAGNOSIS — R799 Abnormal finding of blood chemistry, unspecified: Secondary | ICD-10-CM | POA: Diagnosis not present

## 2015-02-05 ENCOUNTER — Telehealth: Payer: Self-pay | Admitting: Endocrinology

## 2015-02-05 DIAGNOSIS — E785 Hyperlipidemia, unspecified: Secondary | ICD-10-CM

## 2015-02-05 DIAGNOSIS — E1022 Type 1 diabetes mellitus with diabetic chronic kidney disease: Secondary | ICD-10-CM

## 2015-02-05 MED ORDER — INSULIN NPH ISOPHANE & REGULAR (70-30) 100 UNIT/ML ~~LOC~~ SUSP: 50.0000 [IU] | Freq: Every day | SUBCUTANEOUS | Status: DC

## 2015-02-05 NOTE — Telephone Encounter (Signed)
See note below and please advise, Thanks! 

## 2015-02-05 NOTE — Telephone Encounter (Signed)
Please increase insulin to 50 units qam

## 2015-02-05 NOTE — Telephone Encounter (Signed)
pts sugars have been over 200 Last Monday bedtime 218 Last Wednesday 211 before dinner Saturday 214 before lunch Sunday before dinner 226

## 2015-02-05 NOTE — Telephone Encounter (Addendum)
I contacted the pt and advised of note below. Pt voiced understanding.  

## 2015-02-09 ENCOUNTER — Telehealth: Payer: Self-pay | Admitting: Endocrinology

## 2015-02-09 ENCOUNTER — Other Ambulatory Visit: Payer: Self-pay

## 2015-02-09 MED ORDER — GLUCOSE BLOOD VI STRP: 1.0000 | ORAL_STRIP | Freq: Two times a day (BID) | Status: DC

## 2015-02-09 NOTE — Telephone Encounter (Signed)
Test strips require a form to be completed by [place] the pharmacy is sending it now

## 2015-02-09 NOTE — Telephone Encounter (Signed)
Noted  

## 2015-02-12 ENCOUNTER — Other Ambulatory Visit: Payer: Self-pay

## 2015-02-12 MED ORDER — ONETOUCH ULTRASOFT LANCETS MISC: Status: AC

## 2015-02-12 MED ORDER — GLUCOSE BLOOD VI STRP: 1.0000 | ORAL_STRIP | Freq: Two times a day (BID) | Status: DC

## 2015-02-13 DIAGNOSIS — Z4822 Encounter for aftercare following kidney transplant: Secondary | ICD-10-CM | POA: Diagnosis not present

## 2015-02-13 DIAGNOSIS — E039 Hypothyroidism, unspecified: Secondary | ICD-10-CM | POA: Diagnosis not present

## 2015-02-13 DIAGNOSIS — N186 End stage renal disease: Secondary | ICD-10-CM | POA: Diagnosis not present

## 2015-02-13 DIAGNOSIS — Z6839 Body mass index (BMI) 39.0-39.9, adult: Secondary | ICD-10-CM | POA: Diagnosis not present

## 2015-02-13 DIAGNOSIS — E785 Hyperlipidemia, unspecified: Secondary | ICD-10-CM | POA: Diagnosis not present

## 2015-02-13 DIAGNOSIS — E669 Obesity, unspecified: Secondary | ICD-10-CM | POA: Diagnosis not present

## 2015-02-13 DIAGNOSIS — Z940 Kidney transplant status: Secondary | ICD-10-CM | POA: Diagnosis not present

## 2015-02-13 DIAGNOSIS — B9789 Other viral agents as the cause of diseases classified elsewhere: Secondary | ICD-10-CM | POA: Diagnosis not present

## 2015-02-13 DIAGNOSIS — I2510 Atherosclerotic heart disease of native coronary artery without angina pectoris: Secondary | ICD-10-CM | POA: Diagnosis not present

## 2015-02-13 DIAGNOSIS — E1121 Type 2 diabetes mellitus with diabetic nephropathy: Secondary | ICD-10-CM | POA: Diagnosis not present

## 2015-02-13 DIAGNOSIS — D8989 Other specified disorders involving the immune mechanism, not elsewhere classified: Secondary | ICD-10-CM | POA: Diagnosis not present

## 2015-02-13 DIAGNOSIS — Z79899 Other long term (current) drug therapy: Secondary | ICD-10-CM | POA: Diagnosis not present

## 2015-02-13 DIAGNOSIS — Z992 Dependence on renal dialysis: Secondary | ICD-10-CM | POA: Diagnosis not present

## 2015-02-13 DIAGNOSIS — Z7982 Long term (current) use of aspirin: Secondary | ICD-10-CM | POA: Diagnosis not present

## 2015-02-13 DIAGNOSIS — I120 Hypertensive chronic kidney disease with stage 5 chronic kidney disease or end stage renal disease: Secondary | ICD-10-CM | POA: Diagnosis not present

## 2015-02-13 DIAGNOSIS — E1169 Type 2 diabetes mellitus with other specified complication: Secondary | ICD-10-CM | POA: Diagnosis not present

## 2015-02-13 DIAGNOSIS — E1122 Type 2 diabetes mellitus with diabetic chronic kidney disease: Secondary | ICD-10-CM | POA: Diagnosis not present

## 2015-02-13 DIAGNOSIS — D899 Disorder involving the immune mechanism, unspecified: Secondary | ICD-10-CM | POA: Diagnosis not present

## 2015-03-16 ENCOUNTER — Telehealth: Payer: Self-pay | Admitting: Endocrinology

## 2015-03-16 NOTE — Telephone Encounter (Signed)
Pt took BS today 300 at 12:10 PM and the only thing she had to eat this AM was oatmeal and sweet and low and a little butter and water at 8 AM. 10 AM took 50 mg of insulin and getting ready to eat lunch. She does not know what to do.  Before dinner yesterday it was 127 and before breakfast 180  1/4 before laying down 98 and before breakfast 145

## 2015-03-16 NOTE — Telephone Encounter (Signed)
Please recheck and let us know.

## 2015-03-16 NOTE — Telephone Encounter (Signed)
Pt advised of note below and voiced understanding.  

## 2015-03-16 NOTE — Telephone Encounter (Signed)
Ok, Please continue the same insulin i'll see you next time.   Call sooner if probs

## 2015-03-16 NOTE — Telephone Encounter (Signed)
See note below and please advise, Thanks! 

## 2015-03-16 NOTE — Telephone Encounter (Signed)
I contacted the pt and while we were on the telephone her blood sugar was 264.

## 2015-03-20 DIAGNOSIS — N186 End stage renal disease: Secondary | ICD-10-CM | POA: Diagnosis not present

## 2015-03-20 DIAGNOSIS — Z79899 Other long term (current) drug therapy: Secondary | ICD-10-CM | POA: Diagnosis not present

## 2015-03-20 DIAGNOSIS — Z792 Long term (current) use of antibiotics: Secondary | ICD-10-CM | POA: Diagnosis not present

## 2015-03-20 DIAGNOSIS — B349 Viral infection, unspecified: Secondary | ICD-10-CM | POA: Diagnosis not present

## 2015-03-20 DIAGNOSIS — Z4822 Encounter for aftercare following kidney transplant: Secondary | ICD-10-CM | POA: Diagnosis not present

## 2015-03-20 DIAGNOSIS — I151 Hypertension secondary to other renal disorders: Secondary | ICD-10-CM | POA: Diagnosis not present

## 2015-03-20 DIAGNOSIS — E785 Hyperlipidemia, unspecified: Secondary | ICD-10-CM | POA: Diagnosis not present

## 2015-03-20 DIAGNOSIS — D8989 Other specified disorders involving the immune mechanism, not elsewhere classified: Secondary | ICD-10-CM | POA: Diagnosis not present

## 2015-03-20 DIAGNOSIS — E1121 Type 2 diabetes mellitus with diabetic nephropathy: Secondary | ICD-10-CM | POA: Diagnosis not present

## 2015-03-20 DIAGNOSIS — Z940 Kidney transplant status: Secondary | ICD-10-CM | POA: Diagnosis not present

## 2015-03-20 DIAGNOSIS — Z7982 Long term (current) use of aspirin: Secondary | ICD-10-CM | POA: Diagnosis not present

## 2015-03-20 DIAGNOSIS — I120 Hypertensive chronic kidney disease with stage 5 chronic kidney disease or end stage renal disease: Secondary | ICD-10-CM | POA: Diagnosis not present

## 2015-03-20 DIAGNOSIS — I2510 Atherosclerotic heart disease of native coronary artery without angina pectoris: Secondary | ICD-10-CM | POA: Diagnosis not present

## 2015-03-20 DIAGNOSIS — E669 Obesity, unspecified: Secondary | ICD-10-CM | POA: Diagnosis not present

## 2015-03-20 DIAGNOSIS — Z794 Long term (current) use of insulin: Secondary | ICD-10-CM | POA: Diagnosis not present

## 2015-03-20 DIAGNOSIS — E119 Type 2 diabetes mellitus without complications: Secondary | ICD-10-CM | POA: Diagnosis not present

## 2015-03-20 DIAGNOSIS — D899 Disorder involving the immune mechanism, unspecified: Secondary | ICD-10-CM | POA: Diagnosis not present

## 2015-03-27 ENCOUNTER — Encounter: Payer: Self-pay | Admitting: Endocrinology

## 2015-03-28 ENCOUNTER — Telehealth: Payer: Self-pay | Admitting: Endocrinology

## 2015-03-28 MED ORDER — LEVOTHYROXINE SODIUM 112 MCG PO TABS: 112.0000 ug | ORAL_TABLET | Freq: Every day | ORAL | Status: DC

## 2015-03-28 NOTE — Telephone Encounter (Signed)
Patient called and would like a refill on her medication    Rx: Levothyroxine   Pharmacy: [facility]  Thank you

## 2015-03-28 NOTE — Telephone Encounter (Signed)
Rx refilled.

## 2015-04-17 DIAGNOSIS — D899 Disorder involving the immune mechanism, unspecified: Secondary | ICD-10-CM | POA: Diagnosis not present

## 2015-04-17 DIAGNOSIS — E118 Type 2 diabetes mellitus with unspecified complications: Secondary | ICD-10-CM | POA: Diagnosis not present

## 2015-04-17 DIAGNOSIS — I2510 Atherosclerotic heart disease of native coronary artery without angina pectoris: Secondary | ICD-10-CM | POA: Diagnosis not present

## 2015-04-17 DIAGNOSIS — Z6834 Body mass index (BMI) 34.0-34.9, adult: Secondary | ICD-10-CM | POA: Diagnosis not present

## 2015-04-17 DIAGNOSIS — Z794 Long term (current) use of insulin: Secondary | ICD-10-CM | POA: Diagnosis not present

## 2015-04-17 DIAGNOSIS — E669 Obesity, unspecified: Secondary | ICD-10-CM | POA: Diagnosis not present

## 2015-04-17 DIAGNOSIS — Z992 Dependence on renal dialysis: Secondary | ICD-10-CM | POA: Diagnosis not present

## 2015-04-17 DIAGNOSIS — D8989 Other specified disorders involving the immune mechanism, not elsewhere classified: Secondary | ICD-10-CM | POA: Diagnosis not present

## 2015-04-17 DIAGNOSIS — Z95818 Presence of other cardiac implants and grafts: Secondary | ICD-10-CM | POA: Diagnosis not present

## 2015-04-17 DIAGNOSIS — B349 Viral infection, unspecified: Secondary | ICD-10-CM | POA: Diagnosis not present

## 2015-04-17 DIAGNOSIS — E039 Hypothyroidism, unspecified: Secondary | ICD-10-CM | POA: Diagnosis not present

## 2015-04-17 DIAGNOSIS — E119 Type 2 diabetes mellitus without complications: Secondary | ICD-10-CM | POA: Diagnosis not present

## 2015-04-17 DIAGNOSIS — I10 Essential (primary) hypertension: Secondary | ICD-10-CM | POA: Diagnosis not present

## 2015-04-17 DIAGNOSIS — Z4822 Encounter for aftercare following kidney transplant: Secondary | ICD-10-CM | POA: Diagnosis not present

## 2015-04-17 DIAGNOSIS — Z79899 Other long term (current) drug therapy: Secondary | ICD-10-CM | POA: Diagnosis not present

## 2015-04-17 DIAGNOSIS — Z7982 Long term (current) use of aspirin: Secondary | ICD-10-CM | POA: Diagnosis not present

## 2015-04-17 DIAGNOSIS — Z940 Kidney transplant status: Secondary | ICD-10-CM | POA: Diagnosis not present

## 2015-04-17 DIAGNOSIS — E1121 Type 2 diabetes mellitus with diabetic nephropathy: Secondary | ICD-10-CM | POA: Diagnosis not present

## 2015-04-19 ENCOUNTER — Encounter: Payer: Self-pay | Admitting: Endocrinology

## 2015-04-19 ENCOUNTER — Ambulatory Visit (INDEPENDENT_AMBULATORY_CARE_PROVIDER_SITE_OTHER): Payer: Medicare Other | Admitting: Endocrinology

## 2015-04-19 VITALS — BP 136/80 | HR 78 | Temp 98.8°F | Ht 60.0 in | Wt 201.0 lb

## 2015-04-19 DIAGNOSIS — E785 Hyperlipidemia, unspecified: Secondary | ICD-10-CM

## 2015-04-19 DIAGNOSIS — N189 Chronic kidney disease, unspecified: Secondary | ICD-10-CM

## 2015-04-19 DIAGNOSIS — E1022 Type 1 diabetes mellitus with diabetic chronic kidney disease: Secondary | ICD-10-CM

## 2015-04-19 MED ORDER — INSULIN NPH ISOPHANE & REGULAR (70-30) 100 UNIT/ML ~~LOC~~ SUSP: 50.0000 [IU] | Freq: Every day | SUBCUTANEOUS | Status: DC

## 2015-04-19 NOTE — Progress Notes (Signed)
Subjective:    Patient ID: Sandy Roy, female    DOB: 1950/09/22, 65 y.o.   MRN: [id number]  HPI Pt returns for f/u of diabetes mellitus: DM type: insulin-requiring type 2 Dx'ed: [id number] Complications: CAD and renal failure.  Therapy: insulin since 2015.   GDM: never.   DKA: never Severe hypoglycemia: never.   Pancreatitis: never.   Other info: she wants a QD, inexpensive insulin regimen.  Interval history: no cbg record, but states cbg's are well-controlled.  pt states she feels well in general. Past Medical History  Diagnosis Date  . CONGESTIVE HEART FAILURE 12/14/2006  . CORONARY ARTERY DISEASE 12/14/2006  . HYPERLIPIDEMIA 12/14/2006  . HYPERTENSION 12/14/2006  . Hypoparathyroidism ([place]) 12/21/2006  . Hypopotassemia 01/13/2007  . HYPOTHYROIDISM, POSTSURGICAL 12/15/2006  . NEOPLASM, MALIGNANT, THYROID GLAND 12/15/2006    Stage 1 papillary adenocarcinoma, one very small focus each lobe (39mm, 11mm)  6/09: CT neck: no evidence of recurrence  . OBSTRUCTIVE SLEEP APNEA 02/23/2007    does not use a cpap  . DIABETES MELLITUS, TYPE II 12/14/2006    Type 2  . Depression     not recently  . Orbital fracture ([facility])     left eye  . Shortness of breath dyspnea     with exertion  . Chronic kidney disease     ESRD-  Hemo- M_W_F    Past Surgical History  Procedure Laterality Date  . Thyroidectomy  2008  . Cholecystectomy    . Tubal ligation    . Ovary surgery    . Tonsillectomy    . Orbital fracture surgery    . Colonoscopy    . Breast surgery Left     nipple leaking, had surgery to fix the leak  . Cardiac catheterization      ? 10 years ago  . Bascilic vein transposition Right 02/07/2014    Procedure: FIRST STAGE BASILIC VEIN TRANSPOSITION;  Surgeon: Conrad [place], MD;  Location: [facility];  Service: Vascular;  Laterality: Right;  . Bascilic vein transposition Right 05/30/2014    Procedure: SECOND STAGE BASILIC VEIN TRANSPOSITION;  Surgeon: Conrad [place], MD;  Location: [facility];   Service: Vascular;  Laterality: Right;    Social History   Social History  . Marital Status: Widowed    Spouse Name: N/A  . Number of Children: N/A  . Years of Education: N/A   Occupational History  . Not on file.   Social History Main Topics  . Smoking status: Former Smoker    Types: Cigarettes  . Smokeless tobacco: Never Used     Comment: "quit years ago"  . Alcohol Use: No  . Drug Use: No  . Sexual Activity: Not on file   Other Topics Concern  . Not on file   Social History Narrative    Current Outpatient Prescriptions on File Prior to Visit  Medication Sig Dispense Refill  . amLODipine (NORVASC) 10 MG tablet Take 0.5 mg by mouth daily. Take 1/2 tab    . aspirin (BAYER LOW STRENGTH) 81 MG EC tablet Take 81 mg by mouth daily.      Marland Kitchen atorvastatin (LIPITOR) 40 MG tablet 10 mg.     . calcitRIOL (ROCALTROL) 0.25 MCG capsule     . calcium acetate (PHOSLO) 667 MG capsule Take 667-2,001 mg by mouth See admin instructions. Takes 2001 mg daily with meals and 667 mg with snacks    . calcium acetate (PHOSLO) 667 MG capsule     .  cloNIDine (CATAPRES) 0.2 MG tablet Take 0.1 mg by mouth 2 (two) times daily.     Marland Kitchen doxazosin (CARDURA) 2 MG tablet Take 2 mg by mouth at bedtime.     . fish oil-omega-3 fatty acids 1000 MG capsule Take 1 capsule by mouth daily.      Marland Kitchen glucose blood (ONETOUCH VERIO) test strip 1 each by Other route 2 (two) times daily. And lancets 2/day DX CODE E11.9 60 each 12  . Insulin Syringe-Needle U-100 (B-D INSULIN SYRINGE) 31G X 5/16" 0.3 ML MISC Use 1 time per day. 100 each 1  . labetalol (NORMODYNE) 100 MG tablet 2 in the morning 2 in the evening  2  . Lancets (ONETOUCH ULTRASOFT) lancets Use to check blood sugar 2 times per day 100 each 2  . levothyroxine (SYNTHROID, LEVOTHROID) 112 MCG tablet Take 1 tablet (112 mcg total) by mouth daily before breakfast. 90 tablet 1  . Multiple Vitamins-Minerals (MULTIVITAMIN WITH MINERALS) tablet Take 1 tablet by mouth daily.     . mycophenolate (MYFORTIC) 180 MG EC tablet Take 180 mg by mouth 2 (two) times daily.    . nitroGLYCERIN (NITROSTAT) 0.4 MG SL tablet Place 1 tablet (0.4 mg total) under the tongue every 5 (five) minutes as needed. 25 tablet 5  . omeprazole (PRILOSEC) 20 MG capsule Take 20 mg by mouth daily.    Marland Kitchen sulfamethoxazole-trimethoprim (BACTRIM,SEPTRA) 400-80 MG per tablet 1 tablet. On Monday Wednesday and Friday    . tacrolimus (PROGRAF) 1 MG capsule Take 1 mg by mouth. 6 tabs in the morning 6 tabs in the evening    . triazolam (HALCION) 0.25 MG tablet 2 tablets by mouth at bedtime     . valGANciclovir (VALCYTE) 450 MG tablet Take 450 mg by mouth daily. Take on Monday Wednesday and Friday.    . BELSOMRA 10 MG TABS Reported on 04/19/2015    . furosemide (LASIX) 80 MG tablet Reported on 04/19/2015    . oxyCODONE-acetaminophen (PERCOCET/ROXICET) 5-325 MG per tablet Take 1 tablet by mouth every 6 (six) hours as needed. (Patient not taking: Reported on 04/19/2015) 30 tablet 0   No current facility-administered medications on file prior to visit.    No Known Allergies  Family History  Problem Relation Age of Onset  . Cancer Other   . COPD Other   . Hypertension Other   . Hyperlipidemia Other   . Stroke Other   . Heart disease Other   . Diabetes Other   . Heart failure Mother   . Diabetes Mother   . Cancer Father     BP 136/80 mmHg  Pulse 78  Temp(Src) 98.8 F (37.1 C) (Oral)  Ht 5' (1.524 m)  Wt 201 lb (91.173 kg)  BMI 39.26 kg/m2  SpO2 98%  Review of Systems She denies hypoglycemia    Objective:   Physical Exam VITAL SIGNS:  See vs page GENERAL: no distress Pulses: dorsalis pedis intact bilat.   MSK: no deformity of the feet CV: trace bilat leg edema Skin:  no ulcer on the feet.  normal color and temp on the feet. Neuro: sensation is intact to touch on the feet    outside test results are reviewed: A1c=7.3%    Assessment & Plan:  DM: this is the best control this pt should aim  for, given this regimen, which does match insulin to her changing needs throughout the day.  Patient is advised the following: Patient Instructions  check your blood sugar twice a day.  vary the time of day when you check, between before the 3 meals, and at bedtime.  also check if you have symptoms of your blood sugar being too high or too low.  please keep a record of the readings and bring it to your next appointment here.  You can write it on any piece of paper.  please call us sooner if your blood sugar goes below 70, or if you have a lot of readings over 200. Please come back for a follow-up appointment in 3 months.   Please continue the same insulin.  On this type of insulin schedule, you should eat meals on a regular schedule.  If a meal is missed or significantly delayed, your blood sugar could go low.

## 2015-04-19 NOTE — Patient Instructions (Addendum)
check your blood sugar twice a day.  vary the time of day when you check, between before the 3 meals, and at bedtime.  also check if you have symptoms of your blood sugar being too high or too low.  please keep a record of the readings and bring it to your next appointment here.  You can write it on any piece of paper.  please call us sooner if your blood sugar goes below 70, or if you have a lot of readings over 200. Please come back for a follow-up appointment in 3 months.   Please continue the same insulin.  On this type of insulin schedule, you should eat meals on a regular schedule.  If a meal is missed or significantly delayed, your blood sugar could go low.

## 2015-04-23 ENCOUNTER — Telehealth: Payer: Self-pay | Admitting: Endocrinology

## 2015-04-23 MED ORDER — INSULIN NPH ISOPHANE & REGULAR (70-30) 100 UNIT/ML ~~LOC~~ SUSP: 50.0000 [IU] | Freq: Every day | SUBCUTANEOUS | Status: DC

## 2015-04-23 NOTE — Telephone Encounter (Signed)
See note below and please advise, Thanks! 

## 2015-04-23 NOTE — Addendum Note (Signed)
Addended by: Verlin Grills T on: 04/23/2015 04:39 PM   Modules accepted: Orders

## 2015-04-23 NOTE — Telephone Encounter (Signed)
I contacted the pt and she stated she is going to stay on the the humulin 70/30.

## 2015-04-23 NOTE — Telephone Encounter (Signed)
Pt received a letter for insurance that novolin is no longer covered it has been changed to humulin 70/30. The humulin is $35 and the pt is asking if there is an insulin that is even cheaper than that

## 2015-04-23 NOTE — Telephone Encounter (Signed)
Either novolin or humulin is fine with me.

## 2015-05-15 ENCOUNTER — Telehealth: Payer: Self-pay | Admitting: Endocrinology

## 2015-05-15 DIAGNOSIS — E785 Hyperlipidemia, unspecified: Secondary | ICD-10-CM | POA: Diagnosis not present

## 2015-05-15 DIAGNOSIS — I120 Hypertensive chronic kidney disease with stage 5 chronic kidney disease or end stage renal disease: Secondary | ICD-10-CM | POA: Diagnosis not present

## 2015-05-15 DIAGNOSIS — Z940 Kidney transplant status: Secondary | ICD-10-CM | POA: Diagnosis not present

## 2015-05-15 DIAGNOSIS — N186 End stage renal disease: Secondary | ICD-10-CM | POA: Diagnosis not present

## 2015-05-15 DIAGNOSIS — E1122 Type 2 diabetes mellitus with diabetic chronic kidney disease: Secondary | ICD-10-CM | POA: Diagnosis not present

## 2015-05-15 DIAGNOSIS — Z7982 Long term (current) use of aspirin: Secondary | ICD-10-CM | POA: Diagnosis not present

## 2015-05-15 DIAGNOSIS — E119 Type 2 diabetes mellitus without complications: Secondary | ICD-10-CM | POA: Diagnosis not present

## 2015-05-15 DIAGNOSIS — Z992 Dependence on renal dialysis: Secondary | ICD-10-CM | POA: Diagnosis not present

## 2015-05-15 DIAGNOSIS — L659 Nonscarring hair loss, unspecified: Secondary | ICD-10-CM | POA: Diagnosis not present

## 2015-05-15 DIAGNOSIS — I2510 Atherosclerotic heart disease of native coronary artery without angina pectoris: Secondary | ICD-10-CM | POA: Diagnosis not present

## 2015-05-15 DIAGNOSIS — B349 Viral infection, unspecified: Secondary | ICD-10-CM | POA: Diagnosis not present

## 2015-05-15 DIAGNOSIS — D649 Anemia, unspecified: Secondary | ICD-10-CM | POA: Diagnosis not present

## 2015-05-15 DIAGNOSIS — Z794 Long term (current) use of insulin: Secondary | ICD-10-CM | POA: Diagnosis not present

## 2015-05-15 DIAGNOSIS — D8989 Other specified disorders involving the immune mechanism, not elsewhere classified: Secondary | ICD-10-CM | POA: Diagnosis not present

## 2015-05-15 DIAGNOSIS — Z4822 Encounter for aftercare following kidney transplant: Secondary | ICD-10-CM | POA: Diagnosis not present

## 2015-05-15 DIAGNOSIS — D899 Disorder involving the immune mechanism, unspecified: Secondary | ICD-10-CM | POA: Diagnosis not present

## 2015-05-15 DIAGNOSIS — I10 Essential (primary) hypertension: Secondary | ICD-10-CM | POA: Diagnosis not present

## 2015-05-15 DIAGNOSIS — Z79899 Other long term (current) drug therapy: Secondary | ICD-10-CM | POA: Diagnosis not present

## 2015-05-15 DIAGNOSIS — E039 Hypothyroidism, unspecified: Secondary | ICD-10-CM | POA: Diagnosis not present

## 2015-05-15 NOTE — Telephone Encounter (Signed)
Pt just got out of the visit with [facility] and the labs for her thyroid that they drew please review the labs in [facility] system

## 2015-05-15 NOTE — Telephone Encounter (Signed)
See note below and please advise, Thanks! 

## 2015-05-16 NOTE — Telephone Encounter (Signed)
I contacted the pt. She stated she had the blood tests done on 05/15/2015. Pt stated she is going to contact the office and have them fax the blood test to our office.

## 2015-05-16 NOTE — Telephone Encounter (Signed)
i checked care everywhere.  Last labs are 04/17/15, and thyroid was not checked then.  When did you have labs done?

## 2015-05-17 ENCOUNTER — Telehealth: Payer: Self-pay | Admitting: Endocrinology

## 2015-05-17 MED ORDER — LEVOTHYROXINE SODIUM 112 MCG PO TABS: 112.0000 ug | ORAL_TABLET | Freq: Every day | ORAL | Status: DC

## 2015-05-17 NOTE — Telephone Encounter (Signed)
Please resend

## 2015-05-17 NOTE — Telephone Encounter (Signed)
Rx resubmitted. Left a vm advising the pt her med has been sent to the [facility] in [place].

## 2015-05-17 NOTE — Telephone Encounter (Signed)
See note below and please advise.

## 2015-05-17 NOTE — Telephone Encounter (Signed)
Pt calling because she said the pharmacy still didn't receive Rx for Levothyroxine and she is even more weak now.

## 2015-05-18 ENCOUNTER — Other Ambulatory Visit: Payer: Self-pay | Admitting: Endocrinology

## 2015-05-18 ENCOUNTER — Telehealth: Payer: Self-pay | Admitting: Endocrinology

## 2015-05-18 MED ORDER — LEVOTHYROXINE SODIUM 125 MCG PO TABS: 125.0000 ug | ORAL_TABLET | Freq: Every day | ORAL | Status: DC

## 2015-05-18 NOTE — Telephone Encounter (Signed)
Patient called of some concern regarding her thyroid  Sandy Roy states that her lab results were abnormal and would like to know why she is still taking the same dose of medication  Also, she would like to know why her hair is falling out   Please advise    Thank you

## 2015-05-18 NOTE — Telephone Encounter (Signed)
i need results faxed here please

## 2015-05-18 NOTE — Telephone Encounter (Signed)
I contacted the pt and advised rx for 125 mcg has been sent. Requested a call back if the pt would like to discuss.

## 2015-05-18 NOTE — Telephone Encounter (Signed)
Ok, i have sent a prescription to your pharmacy, to increase to 125/d

## 2015-05-18 NOTE — Telephone Encounter (Signed)
See note below and please advise, Thanks! 

## 2015-05-18 NOTE — Telephone Encounter (Signed)
Results placed on your desk to review.

## 2015-05-18 NOTE — Telephone Encounter (Signed)
Pt confirmed she is taking 112 mcg.

## 2015-05-18 NOTE — Telephone Encounter (Signed)
i have results. Please verify that the pill is 112 mcg/d Please let me know Then I'll increase to 125 mcg/d

## 2015-05-21 DIAGNOSIS — Z940 Kidney transplant status: Secondary | ICD-10-CM | POA: Diagnosis not present

## 2015-05-21 DIAGNOSIS — Z79899 Other long term (current) drug therapy: Secondary | ICD-10-CM | POA: Diagnosis not present

## 2015-05-30 ENCOUNTER — Telehealth: Payer: Self-pay | Admitting: Endocrinology

## 2015-05-30 NOTE — Telephone Encounter (Signed)
I contacted the pt and left a vm advising of note below. Requested a call back if the pt would like to discuss.  

## 2015-05-30 NOTE — Telephone Encounter (Signed)
See note below and please advise, Thanks! 

## 2015-05-30 NOTE — Telephone Encounter (Signed)
Pt's sugar seems not in control it has been over 200 consistently since 05/14/15.

## 2015-05-30 NOTE — Telephone Encounter (Signed)
Please verify insulin is 50 units qam Then increase to 60 units qam i'll see you next time.

## 2015-06-05 DIAGNOSIS — E1122 Type 2 diabetes mellitus with diabetic chronic kidney disease: Secondary | ICD-10-CM | POA: Diagnosis not present

## 2015-06-05 DIAGNOSIS — Z940 Kidney transplant status: Secondary | ICD-10-CM | POA: Diagnosis not present

## 2015-06-05 DIAGNOSIS — I120 Hypertensive chronic kidney disease with stage 5 chronic kidney disease or end stage renal disease: Secondary | ICD-10-CM | POA: Diagnosis not present

## 2015-06-05 DIAGNOSIS — E039 Hypothyroidism, unspecified: Secondary | ICD-10-CM | POA: Diagnosis not present

## 2015-06-05 DIAGNOSIS — B349 Viral infection, unspecified: Secondary | ICD-10-CM | POA: Diagnosis not present

## 2015-06-05 DIAGNOSIS — Z7982 Long term (current) use of aspirin: Secondary | ICD-10-CM | POA: Diagnosis not present

## 2015-06-05 DIAGNOSIS — Z79899 Other long term (current) drug therapy: Secondary | ICD-10-CM | POA: Diagnosis not present

## 2015-06-05 DIAGNOSIS — Z4822 Encounter for aftercare following kidney transplant: Secondary | ICD-10-CM | POA: Diagnosis not present

## 2015-06-05 DIAGNOSIS — I150 Renovascular hypertension: Secondary | ICD-10-CM | POA: Diagnosis not present

## 2015-06-05 DIAGNOSIS — N186 End stage renal disease: Secondary | ICD-10-CM | POA: Diagnosis not present

## 2015-06-05 DIAGNOSIS — E119 Type 2 diabetes mellitus without complications: Secondary | ICD-10-CM | POA: Diagnosis not present

## 2015-06-05 DIAGNOSIS — Z794 Long term (current) use of insulin: Secondary | ICD-10-CM | POA: Diagnosis not present

## 2015-06-05 DIAGNOSIS — Z6834 Body mass index (BMI) 34.0-34.9, adult: Secondary | ICD-10-CM | POA: Diagnosis not present

## 2015-06-25 ENCOUNTER — Telehealth: Payer: Self-pay | Admitting: Endocrinology

## 2015-06-25 MED ORDER — INSULIN NPH ISOPHANE & REGULAR (70-30) 100 UNIT/ML ~~LOC~~ SUSP: 60.0000 [IU] | Freq: Every day | SUBCUTANEOUS | Status: DC

## 2015-06-25 MED ORDER — INSULIN NPH ISOPHANE & REGULAR (70-30) 100 UNIT/ML ~~LOC~~ SUSP: 50.0000 [IU] | Freq: Every day | SUBCUTANEOUS | Status: DC

## 2015-06-25 MED ORDER — INSULIN NPH ISOPHANE & REGULAR (70-30) 100 UNIT/ML ~~LOC~~ SUSP: SUBCUTANEOUS | Status: DC

## 2015-06-25 NOTE — Telephone Encounter (Signed)
Rx submitted per pt's request.  

## 2015-06-25 NOTE — Telephone Encounter (Addendum)
Rx resubmitted

## 2015-06-25 NOTE — Telephone Encounter (Signed)
Rx resubmitted

## 2015-06-25 NOTE — Telephone Encounter (Signed)
Sandy Roy from [facility] in [place] called asking if you could resend prescription with the maximun dosage

## 2015-06-25 NOTE — Telephone Encounter (Signed)
[facility] called and said that the maximum daily dose was not included on the Rx for insulin

## 2015-06-25 NOTE — Telephone Encounter (Signed)
[facility], stated that she is almost out of her insulin, need a refill Humalin 70/30 60 units  06/22/15 12:12 pm

## 2015-07-03 DIAGNOSIS — G4700 Insomnia, unspecified: Secondary | ICD-10-CM | POA: Diagnosis not present

## 2015-07-03 DIAGNOSIS — D8989 Other specified disorders involving the immune mechanism, not elsewhere classified: Secondary | ICD-10-CM | POA: Diagnosis not present

## 2015-07-03 DIAGNOSIS — N186 End stage renal disease: Secondary | ICD-10-CM | POA: Diagnosis not present

## 2015-07-03 DIAGNOSIS — Z85850 Personal history of malignant neoplasm of thyroid: Secondary | ICD-10-CM | POA: Diagnosis not present

## 2015-07-03 DIAGNOSIS — E1169 Type 2 diabetes mellitus with other specified complication: Secondary | ICD-10-CM | POA: Diagnosis not present

## 2015-07-03 DIAGNOSIS — E669 Obesity, unspecified: Secondary | ICD-10-CM | POA: Diagnosis not present

## 2015-07-03 DIAGNOSIS — E1122 Type 2 diabetes mellitus with diabetic chronic kidney disease: Secondary | ICD-10-CM | POA: Diagnosis not present

## 2015-07-03 DIAGNOSIS — E119 Type 2 diabetes mellitus without complications: Secondary | ICD-10-CM | POA: Diagnosis not present

## 2015-07-03 DIAGNOSIS — Z4822 Encounter for aftercare following kidney transplant: Secondary | ICD-10-CM | POA: Diagnosis not present

## 2015-07-03 DIAGNOSIS — Z992 Dependence on renal dialysis: Secondary | ICD-10-CM | POA: Diagnosis not present

## 2015-07-03 DIAGNOSIS — E785 Hyperlipidemia, unspecified: Secondary | ICD-10-CM | POA: Diagnosis not present

## 2015-07-03 DIAGNOSIS — I150 Renovascular hypertension: Secondary | ICD-10-CM | POA: Diagnosis not present

## 2015-07-03 DIAGNOSIS — Z794 Long term (current) use of insulin: Secondary | ICD-10-CM | POA: Diagnosis not present

## 2015-07-03 DIAGNOSIS — Z940 Kidney transplant status: Secondary | ICD-10-CM | POA: Diagnosis not present

## 2015-07-03 DIAGNOSIS — F3340 Major depressive disorder, recurrent, in remission, unspecified: Secondary | ICD-10-CM | POA: Diagnosis not present

## 2015-07-03 DIAGNOSIS — Z79899 Other long term (current) drug therapy: Secondary | ICD-10-CM | POA: Diagnosis not present

## 2015-07-03 DIAGNOSIS — I2510 Atherosclerotic heart disease of native coronary artery without angina pectoris: Secondary | ICD-10-CM | POA: Diagnosis not present

## 2015-07-03 DIAGNOSIS — E1121 Type 2 diabetes mellitus with diabetic nephropathy: Secondary | ICD-10-CM | POA: Diagnosis not present

## 2015-07-03 DIAGNOSIS — I120 Hypertensive chronic kidney disease with stage 5 chronic kidney disease or end stage renal disease: Secondary | ICD-10-CM | POA: Diagnosis not present

## 2015-07-03 DIAGNOSIS — E039 Hypothyroidism, unspecified: Secondary | ICD-10-CM | POA: Diagnosis not present

## 2015-07-03 DIAGNOSIS — T8613 Kidney transplant infection: Secondary | ICD-10-CM | POA: Diagnosis not present

## 2015-07-03 DIAGNOSIS — I151 Hypertension secondary to other renal disorders: Secondary | ICD-10-CM | POA: Diagnosis not present

## 2015-07-03 DIAGNOSIS — B349 Viral infection, unspecified: Secondary | ICD-10-CM | POA: Diagnosis not present

## 2015-07-03 DIAGNOSIS — E782 Mixed hyperlipidemia: Secondary | ICD-10-CM | POA: Diagnosis not present

## 2015-07-03 DIAGNOSIS — Z9861 Coronary angioplasty status: Secondary | ICD-10-CM | POA: Diagnosis not present

## 2015-07-03 DIAGNOSIS — Z7982 Long term (current) use of aspirin: Secondary | ICD-10-CM | POA: Diagnosis not present

## 2015-07-03 DIAGNOSIS — D899 Disorder involving the immune mechanism, unspecified: Secondary | ICD-10-CM | POA: Diagnosis not present

## 2015-07-03 DIAGNOSIS — Z1389 Encounter for screening for other disorder: Secondary | ICD-10-CM | POA: Diagnosis not present

## 2015-07-16 ENCOUNTER — Encounter: Payer: Self-pay | Admitting: Endocrinology

## 2015-07-16 ENCOUNTER — Ambulatory Visit (INDEPENDENT_AMBULATORY_CARE_PROVIDER_SITE_OTHER): Payer: Medicare Other | Admitting: Endocrinology

## 2015-07-16 VITALS — BP 210/100 | HR 78 | Wt 202.6 lb

## 2015-07-16 DIAGNOSIS — Z794 Long term (current) use of insulin: Secondary | ICD-10-CM | POA: Diagnosis not present

## 2015-07-16 DIAGNOSIS — N185 Chronic kidney disease, stage 5: Secondary | ICD-10-CM | POA: Diagnosis not present

## 2015-07-16 DIAGNOSIS — E1122 Type 2 diabetes mellitus with diabetic chronic kidney disease: Secondary | ICD-10-CM

## 2015-07-16 DIAGNOSIS — E119 Type 2 diabetes mellitus without complications: Secondary | ICD-10-CM | POA: Insufficient documentation

## 2015-07-16 LAB — POCT GLYCOSYLATED HEMOGLOBIN (HGB A1C): HEMOGLOBIN A1C: 7.7

## 2015-07-16 MED ORDER — INSULIN NPH ISOPHANE & REGULAR (70-30) 100 UNIT/ML ~~LOC~~ SUSP: 60.0000 [IU] | SUBCUTANEOUS | Status: DC

## 2015-07-16 NOTE — Progress Notes (Signed)
Subjective:    Patient ID: Laurena Spies, female    DOB: 05/04/1950, 65 y.o.   MRN: [id number]  HPI Pt returns for f/u of diabetes mellitus: DM type: insulin-requiring type 2 Dx'ed: [id number] Complications: CAD and renal failure (transplant 2016).   Therapy: insulin since 2015.   GDM: never.   DKA: never Severe hypoglycemia: never.   Pancreatitis: never.   Other info: she has requested a QD, inexpensive insulin regimen; she is chronically on low-dose prednisone  Interval history: no cbg record, but states cbg's are in the 100's.  pt states she feels well in general.   10 minutes ago, she slammed her car door on her right thumb.  Since then, she has severe pain there.  o assoc numbness  Past Surgical History  Procedure Laterality Date  . Thyroidectomy  2008  . Cholecystectomy    . Tubal ligation    . Ovary surgery    . Tonsillectomy    . Orbital fracture surgery    . Colonoscopy    . Breast surgery Left     nipple leaking, had surgery to fix the leak  . Cardiac catheterization      ? 10 years ago  . Bascilic vein transposition Right 02/07/2014    Procedure: FIRST STAGE BASILIC VEIN TRANSPOSITION;  Surgeon: Conrad [place], MD;  Location: [facility];  Service: Vascular;  Laterality: Right;  . Bascilic vein transposition Right 05/30/2014    Procedure: SECOND STAGE BASILIC VEIN TRANSPOSITION;  Surgeon: Conrad [place], MD;  Location: [facility];  Service: Vascular;  Laterality: Right;    Social History   Social History  . Marital Status: Widowed    Spouse Name: N/A  . Number of Children: N/A  . Years of Education: N/A   Occupational History  . Not on file.   Social History Main Topics  . Smoking status: Former Smoker    Types: Cigarettes  . Smokeless tobacco: Never Used     Comment: "quit years ago"  . Alcohol Use: No  . Drug Use: No  . Sexual Activity: Not on file   Other Topics Concern  . Not on file   Social History Narrative    Current Outpatient Prescriptions on File  Prior to Visit  Medication Sig Dispense Refill  . amLODipine (NORVASC) 10 MG tablet Take 0.5 mg by mouth daily. Take 1/2 tab    . aspirin (BAYER LOW STRENGTH) 81 MG EC tablet Take 81 mg by mouth daily.      Marland Kitchen atorvastatin (LIPITOR) 40 MG tablet 10 mg.     . cloNIDine (CATAPRES) 0.2 MG tablet Take 0.1 mg by mouth 2 (two) times daily.     . fish oil-omega-3 fatty acids 1000 MG capsule Take 1 capsule by mouth daily.      . furosemide (LASIX) 80 MG tablet Reported on 07/16/2015    . glucose blood (ONETOUCH VERIO) test strip 1 each by Other route 2 (two) times daily. And lancets 2/day DX CODE E11.9 60 each 12  . insulin NPH-regular Human (HUMULIN 70/30) (70-30) 100 UNIT/ML injection Inject 60 units daily. Max daily dosage is 60 units. 20 mL 4  . Insulin Syringe-Needle U-100 (B-D INSULIN SYRINGE) 31G X 5/16" 0.3 ML MISC Use 1 time per day. 100 each 1  . labetalol (NORMODYNE) 100 MG tablet 2 in the morning 2 in the evening  2  . Lancets (ONETOUCH ULTRASOFT) lancets Use to check blood sugar 2 times per day 100 each 2  .  levothyroxine (SYNTHROID, LEVOTHROID) 125 MCG tablet Take 1 tablet (125 mcg total) by mouth daily before breakfast. 90 tablet 3  . Multiple Vitamins-Minerals (MULTIVITAMIN WITH MINERALS) tablet Take 1 tablet by mouth daily.    . mycophenolate (MYFORTIC) 180 MG EC tablet Take 180 mg by mouth 2 (two) times daily.    . nitroGLYCERIN (NITROSTAT) 0.4 MG SL tablet Place 1 tablet (0.4 mg total) under the tongue every 5 (five) minutes as needed. 25 tablet 5  . omeprazole (PRILOSEC) 20 MG capsule Take 20 mg by mouth daily.    . predniSONE (DELTASONE) 5 MG tablet Take 5 mg by mouth daily with breakfast.    . sulfamethoxazole-trimethoprim (BACTRIM,SEPTRA) 400-80 MG per tablet 1 tablet. On Monday Wednesday and Friday    . tacrolimus (PROGRAF) 1 MG capsule Take 1 mg by mouth. 6 tabs in the morning 6 tabs in the evening    . triazolam (HALCION) 0.25 MG tablet 2 tablets by mouth at bedtime     .  valGANciclovir (VALCYTE) 450 MG tablet Take 450 mg by mouth daily. Take on Monday Wednesday and Friday.    . BELSOMRA 10 MG TABS Reported on 07/16/2015    . calcitRIOL (ROCALTROL) 0.25 MCG capsule Reported on 07/16/2015    . calcium acetate (PHOSLO) 667 MG capsule Take 667-2,001 mg by mouth See admin instructions. Reported on 07/16/2015    . calcium acetate (PHOSLO) 667 MG capsule Reported on 07/16/2015    . doxazosin (CARDURA) 2 MG tablet Take 2 mg by mouth at bedtime. Reported on 07/16/2015    . oxyCODONE-acetaminophen (PERCOCET/ROXICET) 5-325 MG per tablet Take 1 tablet by mouth every 6 (six) hours as needed. (Patient not taking: Reported on 07/16/2015) 30 tablet 0   No current facility-administered medications on file prior to visit.    No Known Allergies  Family History  Problem Relation Age of Onset  . Cancer Other   . COPD Other   . Hypertension Other   . Hyperlipidemia Other   . Stroke Other   . Heart disease Other   . Diabetes Other   . Heart failure Mother   . Diabetes Mother   . Cancer Father     Review of Systems She denies hypoglycemia and weight change.    Objective:   Physical Exam VITAL SIGNS:  See vs page GENERAL: no distress Right thumb: moderate swelling and tenderness distal to the IP joint.  There is an approx 8 mm laceration at the fingerpad.    A1c=7.7%     Assessment & Plan:  Thumb contusion, new, with small laceration.  Antibiotic ointment and a bandaid are applied.  She declines x-ray HTN: exac by pain DM: this is the best control this pt should aim for, given this regimen, which does match insulin to her changing needs throughout the day.     Patient is advised the following: Patient Instructions  check your blood sugar twice a day.  vary the time of day when you check, between before the 3 meals, and at bedtime.  also check if you have symptoms of your blood sugar being too high or too low.  please keep a record of the readings and bring it to your next  appointment here.  You can write it on any piece of paper.  please call us sooner if your blood sugar goes below 70, or if you have a lot of readings over 200. Please call Dr Moreen Fowler if your thumb continues to hurt.  You will get  better much faster if you elevate your right hand above the rest of your body. Please come back for a follow-up appointment in 4 months.   Please continue the same insulin.   On this type of insulin schedule, you should eat meals on a regular schedule.  If a meal is missed or significantly delayed, your blood sugar could go low.

## 2015-07-16 NOTE — Patient Instructions (Addendum)
check your blood sugar twice a day.  vary the time of day when you check, between before the 3 meals, and at bedtime.  also check if you have symptoms of your blood sugar being too high or too low.  please keep a record of the readings and bring it to your next appointment here.  You can write it on any piece of paper.  please call us sooner if your blood sugar goes below 70, or if you have a lot of readings over 200. Please call Dr Moreen Fowler if your thumb continues to hurt.  You will get better much faster if you elevate your right hand above the rest of your body. Please come back for a follow-up appointment in 4 months.   Please continue the same insulin.   On this type of insulin schedule, you should eat meals on a regular schedule.  If a meal is missed or significantly delayed, your blood sugar could go low.

## 2015-07-31 DIAGNOSIS — Z940 Kidney transplant status: Secondary | ICD-10-CM | POA: Diagnosis not present

## 2015-08-07 DIAGNOSIS — D8989 Other specified disorders involving the immune mechanism, not elsewhere classified: Secondary | ICD-10-CM | POA: Diagnosis not present

## 2015-08-07 DIAGNOSIS — Z955 Presence of coronary angioplasty implant and graft: Secondary | ICD-10-CM | POA: Diagnosis not present

## 2015-08-07 DIAGNOSIS — D899 Disorder involving the immune mechanism, unspecified: Secondary | ICD-10-CM | POA: Diagnosis not present

## 2015-08-07 DIAGNOSIS — B349 Viral infection, unspecified: Secondary | ICD-10-CM | POA: Diagnosis not present

## 2015-08-07 DIAGNOSIS — L659 Nonscarring hair loss, unspecified: Secondary | ICD-10-CM | POA: Diagnosis not present

## 2015-08-07 DIAGNOSIS — I10 Essential (primary) hypertension: Secondary | ICD-10-CM | POA: Diagnosis not present

## 2015-08-07 DIAGNOSIS — R140 Abdominal distension (gaseous): Secondary | ICD-10-CM | POA: Diagnosis not present

## 2015-08-07 DIAGNOSIS — Z79899 Other long term (current) drug therapy: Secondary | ICD-10-CM | POA: Diagnosis not present

## 2015-08-07 DIAGNOSIS — E669 Obesity, unspecified: Secondary | ICD-10-CM | POA: Diagnosis not present

## 2015-08-07 DIAGNOSIS — D649 Anemia, unspecified: Secondary | ICD-10-CM | POA: Diagnosis not present

## 2015-08-07 DIAGNOSIS — I2510 Atherosclerotic heart disease of native coronary artery without angina pectoris: Secondary | ICD-10-CM | POA: Diagnosis not present

## 2015-08-07 DIAGNOSIS — Z1231 Encounter for screening mammogram for malignant neoplasm of breast: Secondary | ICD-10-CM | POA: Diagnosis not present

## 2015-08-07 DIAGNOSIS — Z7982 Long term (current) use of aspirin: Secondary | ICD-10-CM | POA: Diagnosis not present

## 2015-08-07 DIAGNOSIS — I120 Hypertensive chronic kidney disease with stage 5 chronic kidney disease or end stage renal disease: Secondary | ICD-10-CM | POA: Diagnosis not present

## 2015-08-07 DIAGNOSIS — E1122 Type 2 diabetes mellitus with diabetic chronic kidney disease: Secondary | ICD-10-CM | POA: Diagnosis not present

## 2015-08-07 DIAGNOSIS — E119 Type 2 diabetes mellitus without complications: Secondary | ICD-10-CM | POA: Diagnosis not present

## 2015-08-07 DIAGNOSIS — N186 End stage renal disease: Secondary | ICD-10-CM | POA: Diagnosis not present

## 2015-08-07 DIAGNOSIS — Z940 Kidney transplant status: Secondary | ICD-10-CM | POA: Diagnosis not present

## 2015-08-07 DIAGNOSIS — E1121 Type 2 diabetes mellitus with diabetic nephropathy: Secondary | ICD-10-CM | POA: Diagnosis not present

## 2015-08-07 DIAGNOSIS — E785 Hyperlipidemia, unspecified: Secondary | ICD-10-CM | POA: Diagnosis not present

## 2015-08-07 DIAGNOSIS — Z4822 Encounter for aftercare following kidney transplant: Secondary | ICD-10-CM | POA: Diagnosis not present

## 2015-08-07 DIAGNOSIS — Z794 Long term (current) use of insulin: Secondary | ICD-10-CM | POA: Diagnosis not present

## 2015-08-07 DIAGNOSIS — E039 Hypothyroidism, unspecified: Secondary | ICD-10-CM | POA: Diagnosis not present

## 2015-09-04 DIAGNOSIS — E1121 Type 2 diabetes mellitus with diabetic nephropathy: Secondary | ICD-10-CM | POA: Diagnosis not present

## 2015-09-04 DIAGNOSIS — E1169 Type 2 diabetes mellitus with other specified complication: Secondary | ICD-10-CM | POA: Diagnosis not present

## 2015-09-04 DIAGNOSIS — Z9861 Coronary angioplasty status: Secondary | ICD-10-CM | POA: Diagnosis not present

## 2015-09-04 DIAGNOSIS — D899 Disorder involving the immune mechanism, unspecified: Secondary | ICD-10-CM | POA: Diagnosis not present

## 2015-09-04 DIAGNOSIS — Z6836 Body mass index (BMI) 36.0-36.9, adult: Secondary | ICD-10-CM | POA: Diagnosis not present

## 2015-09-04 DIAGNOSIS — Z794 Long term (current) use of insulin: Secondary | ICD-10-CM | POA: Diagnosis not present

## 2015-09-04 DIAGNOSIS — Z4822 Encounter for aftercare following kidney transplant: Secondary | ICD-10-CM | POA: Diagnosis not present

## 2015-09-04 DIAGNOSIS — I120 Hypertensive chronic kidney disease with stage 5 chronic kidney disease or end stage renal disease: Secondary | ICD-10-CM | POA: Diagnosis not present

## 2015-09-04 DIAGNOSIS — N186 End stage renal disease: Secondary | ICD-10-CM | POA: Diagnosis not present

## 2015-09-04 DIAGNOSIS — I2510 Atherosclerotic heart disease of native coronary artery without angina pectoris: Secondary | ICD-10-CM | POA: Diagnosis not present

## 2015-09-04 DIAGNOSIS — R6881 Early satiety: Secondary | ICD-10-CM | POA: Diagnosis not present

## 2015-09-04 DIAGNOSIS — E039 Hypothyroidism, unspecified: Secondary | ICD-10-CM | POA: Diagnosis not present

## 2015-09-04 DIAGNOSIS — L659 Nonscarring hair loss, unspecified: Secondary | ICD-10-CM | POA: Diagnosis not present

## 2015-09-04 DIAGNOSIS — Z79899 Other long term (current) drug therapy: Secondary | ICD-10-CM | POA: Diagnosis not present

## 2015-09-04 DIAGNOSIS — E785 Hyperlipidemia, unspecified: Secondary | ICD-10-CM | POA: Diagnosis not present

## 2015-09-04 DIAGNOSIS — Z940 Kidney transplant status: Secondary | ICD-10-CM | POA: Diagnosis not present

## 2015-09-04 DIAGNOSIS — B349 Viral infection, unspecified: Secondary | ICD-10-CM | POA: Diagnosis not present

## 2015-09-04 DIAGNOSIS — E669 Obesity, unspecified: Secondary | ICD-10-CM | POA: Diagnosis not present

## 2015-09-04 DIAGNOSIS — Z7952 Long term (current) use of systemic steroids: Secondary | ICD-10-CM | POA: Diagnosis not present

## 2015-09-04 DIAGNOSIS — Z7982 Long term (current) use of aspirin: Secondary | ICD-10-CM | POA: Diagnosis not present

## 2015-09-04 DIAGNOSIS — D631 Anemia in chronic kidney disease: Secondary | ICD-10-CM | POA: Diagnosis not present

## 2015-09-04 DIAGNOSIS — E1122 Type 2 diabetes mellitus with diabetic chronic kidney disease: Secondary | ICD-10-CM | POA: Diagnosis not present

## 2015-09-04 DIAGNOSIS — R140 Abdominal distension (gaseous): Secondary | ICD-10-CM | POA: Diagnosis not present

## 2015-10-01 DIAGNOSIS — I120 Hypertensive chronic kidney disease with stage 5 chronic kidney disease or end stage renal disease: Secondary | ICD-10-CM | POA: Diagnosis not present

## 2015-10-01 DIAGNOSIS — E1122 Type 2 diabetes mellitus with diabetic chronic kidney disease: Secondary | ICD-10-CM | POA: Diagnosis not present

## 2015-10-01 DIAGNOSIS — L309 Dermatitis, unspecified: Secondary | ICD-10-CM | POA: Diagnosis not present

## 2015-10-01 DIAGNOSIS — Z940 Kidney transplant status: Secondary | ICD-10-CM | POA: Diagnosis not present

## 2015-10-01 DIAGNOSIS — G4700 Insomnia, unspecified: Secondary | ICD-10-CM | POA: Diagnosis not present

## 2015-10-01 DIAGNOSIS — E782 Mixed hyperlipidemia: Secondary | ICD-10-CM | POA: Diagnosis not present

## 2015-10-01 DIAGNOSIS — F3340 Major depressive disorder, recurrent, in remission, unspecified: Secondary | ICD-10-CM | POA: Diagnosis not present

## 2015-10-01 DIAGNOSIS — I2510 Atherosclerotic heart disease of native coronary artery without angina pectoris: Secondary | ICD-10-CM | POA: Diagnosis not present

## 2015-10-01 DIAGNOSIS — E039 Hypothyroidism, unspecified: Secondary | ICD-10-CM | POA: Diagnosis not present

## 2015-10-16 DIAGNOSIS — T23262A Burn of second degree of back of left hand, initial encounter: Secondary | ICD-10-CM | POA: Diagnosis not present

## 2015-10-22 DIAGNOSIS — B349 Viral infection, unspecified: Secondary | ICD-10-CM | POA: Diagnosis not present

## 2015-10-22 DIAGNOSIS — Z940 Kidney transplant status: Secondary | ICD-10-CM | POA: Diagnosis not present

## 2015-10-22 DIAGNOSIS — M7989 Other specified soft tissue disorders: Secondary | ICD-10-CM | POA: Diagnosis not present

## 2015-10-22 DIAGNOSIS — N185 Chronic kidney disease, stage 5: Secondary | ICD-10-CM | POA: Diagnosis not present

## 2015-10-22 DIAGNOSIS — I770 Arteriovenous fistula, acquired: Secondary | ICD-10-CM | POA: Diagnosis not present

## 2015-10-23 ENCOUNTER — Other Ambulatory Visit: Payer: Self-pay | Admitting: Nephrology

## 2015-10-23 DIAGNOSIS — Z940 Kidney transplant status: Secondary | ICD-10-CM

## 2015-10-23 DIAGNOSIS — R609 Edema, unspecified: Secondary | ICD-10-CM

## 2015-10-24 ENCOUNTER — Ambulatory Visit
Admission: RE | Admit: 2015-10-24 | Discharge: 2015-10-24 | Disposition: A | Discharge status: Home / Self Care | Payer: Medicare Other | Source: Ambulatory Visit | Attending: Nephrology | Admitting: Nephrology

## 2015-10-24 DIAGNOSIS — R609 Edema, unspecified: Secondary | ICD-10-CM

## 2015-10-24 DIAGNOSIS — R600 Localized edema: Secondary | ICD-10-CM | POA: Diagnosis not present

## 2015-10-24 DIAGNOSIS — Z940 Kidney transplant status: Secondary | ICD-10-CM

## 2015-11-02 DIAGNOSIS — Z940 Kidney transplant status: Secondary | ICD-10-CM | POA: Diagnosis not present

## 2015-11-16 ENCOUNTER — Ambulatory Visit (INDEPENDENT_AMBULATORY_CARE_PROVIDER_SITE_OTHER): Payer: Medicare Other | Admitting: Endocrinology

## 2015-11-16 ENCOUNTER — Encounter: Payer: Self-pay | Admitting: Endocrinology

## 2015-11-16 VITALS — BP 128/86 | HR 64 | Ht 60.0 in | Wt 208.0 lb

## 2015-11-16 DIAGNOSIS — Z794 Long term (current) use of insulin: Secondary | ICD-10-CM | POA: Diagnosis not present

## 2015-11-16 DIAGNOSIS — N185 Chronic kidney disease, stage 5: Secondary | ICD-10-CM

## 2015-11-16 DIAGNOSIS — E1122 Type 2 diabetes mellitus with diabetic chronic kidney disease: Secondary | ICD-10-CM

## 2015-11-16 LAB — POCT GLYCOSYLATED HEMOGLOBIN (HGB A1C): HEMOGLOBIN A1C: 9

## 2015-11-16 MED ORDER — INSULIN NPH ISOPHANE & REGULAR (70-30) 100 UNIT/ML ~~LOC~~ SUSP: 70.0000 [IU] | SUBCUTANEOUS | 4 refills | Status: DC

## 2015-11-16 NOTE — Progress Notes (Signed)
Subjective:    Patient ID: Sandy Roy, female    DOB: [birth year], 65 y.o.   MRN: [id number]  HPI Pt returns for f/u of diabetes mellitus: DM type: insulin-requiring type 2 Dx'ed: 4132 Complications: CAD and renal failure (transplant 2016).   Therapy: insulin since 2015.   GDM: never.   DKA: never Severe hypoglycemia: never.   Pancreatitis: never.   Other info: she has requested a QD, inexpensive insulin regimen; she is chronically on low-dose prednisone.   Interval history: no cbg record, but states cbg's are in the 100's.  It is in general higher as the day goes on.  pt states she feels well in general.  She says she misses the insulin approx once per week.   Past Medical History:  Diagnosis Date  . Chronic kidney disease    ESRD-  Hemo- M_W_F  . CONGESTIVE HEART FAILURE 12/14/2006  . CORONARY ARTERY DISEASE 12/14/2006  . Depression    not recently  . DIABETES MELLITUS, TYPE II 12/14/2006   Type 2  . HYPERLIPIDEMIA 12/14/2006  . HYPERTENSION 12/14/2006  . Hypoparathyroidism ([place]) 12/21/2006  . Hypopotassemia 01/13/2007  . HYPOTHYROIDISM, POSTSURGICAL 12/15/2006  . NEOPLASM, MALIGNANT, THYROID GLAND 12/15/2006   Stage 1 papillary adenocarcinoma, one very small focus each lobe (63mm, 5mm)  6/09: CT neck: no evidence of recurrence  . OBSTRUCTIVE SLEEP APNEA 02/23/2007   does not use a cpap  . Orbital fracture (HCC)    left eye  . Shortness of breath dyspnea    with exertion    Past Surgical History:  Procedure Laterality Date  . BASCILIC VEIN TRANSPOSITION Right 02/07/2014   Procedure: FIRST STAGE BASILIC VEIN TRANSPOSITION;  Surgeon: Conrad Ryan Park, MD;  Location: [place];  Service: Vascular;  Laterality: Right;  . BASCILIC VEIN TRANSPOSITION Right 05/30/2014   Procedure: SECOND STAGE BASILIC VEIN TRANSPOSITION;  Surgeon: Conrad Geneva, MD;  Location: [place];  Service: Vascular;  Laterality: Right;  . BREAST SURGERY Left    nipple leaking, had surgery to fix the leak  . CARDIAC  CATHETERIZATION     ? 10 years ago  . CHOLECYSTECTOMY    . COLONOSCOPY    . ORBITAL FRACTURE SURGERY    . OVARY SURGERY    . THYROIDECTOMY  2008  . TONSILLECTOMY    . TUBAL LIGATION      Social History   Social History  . Marital status: Widowed    Spouse name: N/A  . Number of children: N/A  . Years of education: N/A   Occupational History  . Not on file.   Social History Main Topics  . Smoking status: Former Smoker    Types: Cigarettes  . Smokeless tobacco: Never Used     Comment: "quit years ago"  . Alcohol use No  . Drug use: No  . Sexual activity: Not on file   Other Topics Concern  . Not on file   Social History Narrative  . No narrative on file    Current Outpatient Prescriptions on File Prior to Visit  Medication Sig Dispense Refill  . amLODipine (NORVASC) 10 MG tablet Take 0.5 mg by mouth daily. Take 1/2 tab    . aspirin (BAYER LOW STRENGTH) 81 MG EC tablet Take 81 mg by mouth daily.      . calcium acetate (PHOSLO) 667 MG capsule Reported on 07/16/2015    . cloNIDine (CATAPRES) 0.2 MG tablet Take 0.1 mg by mouth 2 (two) times daily.     Marland Kitchen  fish oil-omega-3 fatty acids 1000 MG capsule Take 1 capsule by mouth daily.      Marland Kitchen glucose blood (ONETOUCH VERIO) test strip 1 each by Other route 2 (two) times daily. And lancets 2/day DX CODE E11.9 60 each 12  . Insulin Syringe-Needle U-100 (B-D INSULIN SYRINGE) 31G X 5/16" 0.3 ML MISC Use 1 time per day. 100 each 1  . labetalol (NORMODYNE) 100 MG tablet 2 in the morning 2 in the evening  2  . Lancets (ONETOUCH ULTRASOFT) lancets Use to check blood sugar 2 times per day 100 each 2  . levothyroxine (SYNTHROID, LEVOTHROID) 125 MCG tablet Take 1 tablet (125 mcg total) by mouth daily before breakfast. 90 tablet 3  . Multiple Vitamins-Minerals (MULTIVITAMIN WITH MINERALS) tablet Take 1 tablet by mouth daily.    . mycophenolate (MYFORTIC) 180 MG EC tablet Take 180 mg by mouth 2 (two) times daily.    . nitroGLYCERIN (NITROSTAT)  0.4 MG SL tablet Place 1 tablet (0.4 mg total) under the tongue every 5 (five) minutes as needed. 25 tablet 5  . omeprazole (PRILOSEC) 20 MG capsule Take 20 mg by mouth daily.    . predniSONE (DELTASONE) 5 MG tablet Take 5 mg by mouth daily with breakfast.    . sulfamethoxazole-trimethoprim (BACTRIM,SEPTRA) 400-80 MG per tablet 1 tablet. On Monday Wednesday and Friday    . tacrolimus (PROGRAF) 1 MG capsule Take 1 mg by mouth. 6 tabs in the morning 6 tabs in the evening    . triazolam (HALCION) 0.25 MG tablet 2 tablets by mouth at bedtime      No current facility-administered medications on file prior to visit.     No Known Allergies  Family History  Problem Relation Age of Onset  . Cancer Other   . COPD Other   . Hypertension Other   . Hyperlipidemia Other   . Stroke Other   . Heart disease Other   . Diabetes Other   . Heart failure Mother   . Diabetes Mother   . Cancer Father     BP 128/86   Pulse 64   Ht 5' (1.524 m)   Wt 208 lb (94.3 kg)   SpO2 97%   BMI 40.62 kg/m   Review of Systems She denies hypoglycemia    Objective:   Physical Exam VITAL SIGNS:  See vs page GENERAL: no distress Pulses: dorsalis pedis intact bilat.   MSK: no deformity of the feet CV: no leg edema Skin:  no ulcer on the feet.  normal color and temp on the feet. Neuro: sensation is intact to touch on the feet.     A1c=9.0%     Assessment & Plan:  DM: worse Noncompliance with cbg recording and insulin. This limits effectiveness of rx.  We discussed.

## 2015-11-16 NOTE — Patient Instructions (Addendum)
check your blood sugar twice a day.  vary the time of day when you check, between before the 3 meals, and at bedtime.  also check if you have symptoms of your blood sugar being too high or too low.  please keep a record of the readings and bring it to your next appointment here.  You can write it on any piece of paper.  please call us sooner if your blood sugar goes below 70, or if you have a lot of readings over 200.  Please increase the insulin to 70 units each morning.  Please come back for a follow-up appointment in 2 months.   On this type of insulin schedule, you should eat meals on a regular schedule.  If a meal is missed or significantly delayed, your blood sugar could go low.

## 2015-11-20 DIAGNOSIS — Z23 Encounter for immunization: Secondary | ICD-10-CM | POA: Diagnosis not present

## 2015-11-26 DIAGNOSIS — I770 Arteriovenous fistula, acquired: Secondary | ICD-10-CM | POA: Diagnosis not present

## 2015-11-26 DIAGNOSIS — Z940 Kidney transplant status: Secondary | ICD-10-CM | POA: Diagnosis not present

## 2015-11-26 DIAGNOSIS — B349 Viral infection, unspecified: Secondary | ICD-10-CM | POA: Diagnosis not present

## 2015-11-26 DIAGNOSIS — I10 Essential (primary) hypertension: Secondary | ICD-10-CM | POA: Diagnosis not present

## 2015-11-26 DIAGNOSIS — M7989 Other specified soft tissue disorders: Secondary | ICD-10-CM | POA: Diagnosis not present

## 2015-12-27 DIAGNOSIS — Z940 Kidney transplant status: Secondary | ICD-10-CM | POA: Diagnosis not present

## 2016-01-03 DIAGNOSIS — Z940 Kidney transplant status: Secondary | ICD-10-CM | POA: Diagnosis not present

## 2016-01-10 DIAGNOSIS — Z940 Kidney transplant status: Secondary | ICD-10-CM | POA: Diagnosis not present

## 2016-01-16 ENCOUNTER — Encounter: Payer: Self-pay | Admitting: Endocrinology

## 2016-01-16 ENCOUNTER — Ambulatory Visit (INDEPENDENT_AMBULATORY_CARE_PROVIDER_SITE_OTHER): Payer: Medicare Other | Admitting: Endocrinology

## 2016-01-16 VITALS — BP 126/70 | HR 64 | Ht 65.0 in | Wt 205.0 lb

## 2016-01-16 DIAGNOSIS — E1122 Type 2 diabetes mellitus with diabetic chronic kidney disease: Secondary | ICD-10-CM

## 2016-01-16 DIAGNOSIS — Z794 Long term (current) use of insulin: Secondary | ICD-10-CM

## 2016-01-16 DIAGNOSIS — E890 Postprocedural hypothyroidism: Secondary | ICD-10-CM | POA: Diagnosis not present

## 2016-01-16 DIAGNOSIS — N185 Chronic kidney disease, stage 5: Secondary | ICD-10-CM

## 2016-01-16 LAB — POCT GLYCOSYLATED HEMOGLOBIN (HGB A1C): HEMOGLOBIN A1C: 8.3

## 2016-01-16 MED ORDER — INSULIN NPH ISOPHANE & REGULAR (70-30) 100 UNIT/ML ~~LOC~~ SUSP: 80.0000 [IU] | SUBCUTANEOUS | 4 refills | Status: DC

## 2016-01-16 NOTE — Patient Instructions (Signed)
check your blood sugar twice a day.  vary the time of day when you check, between before the 3 meals, and at bedtime.  also check if you have symptoms of your blood sugar being too high or too low.  please keep a record of the readings and bring it to your next appointment here.  You can write it on any piece of paper.  please call us sooner if your blood sugar goes below 70, or if you have a lot of readings over 200.  Please increase the insulin to 80 units each morning.  Please come back for a follow-up appointment in 3 months.   On this type of insulin schedule, you should eat meals on a regular schedule.  If a meal is missed or significantly delayed, your blood sugar could go low.

## 2016-01-16 NOTE — Progress Notes (Signed)
Subjective:    Patient ID: Sandy Roy, female    DOB: January 15, 1951, 65 y.o.   MRN: [id number]  HPI Pt returns for f/u of diabetes mellitus: DM type: insulin-requiring type 2 Dx'ed: 6759 Complications: CAD and renal failure (transplant 2016).   Therapy: insulin since 2015.   GDM: never.   DKA: never Severe hypoglycemia: never.   Pancreatitis: never.   Other info: she has requested a QD, inexpensive insulin regimen; she is chronically on low-dose prednisone.   Interval history: She seldom has hypoglycemia, and these episodes are mild.  This happens in the early hrs of the AM, or when a meal is misses. pt had thyroidectomy for 2 small foci of papillary cancer (2008).  foci were not large enough to warrant RAI. she does not notice any nodule at the neck.  Past Medical History:  Diagnosis Date  . Chronic kidney disease    ESRD-  Hemo- M_W_F  . CONGESTIVE HEART FAILURE 12/14/2006  . CORONARY ARTERY DISEASE 12/14/2006  . Depression    not recently  . DIABETES MELLITUS, TYPE II 12/14/2006   Type 2  . HYPERLIPIDEMIA 12/14/2006  . HYPERTENSION 12/14/2006  . Hypoparathyroidism ([place]) 12/21/2006  . Hypopotassemia 01/13/2007  . HYPOTHYROIDISM, POSTSURGICAL 12/15/2006  . NEOPLASM, MALIGNANT, THYROID GLAND 12/15/2006   Stage 1 papillary adenocarcinoma, one very small focus each lobe (58mm, 5mm)  6/09: CT neck: no evidence of recurrence  . OBSTRUCTIVE SLEEP APNEA 02/23/2007   does not use a cpap  . Orbital fracture (HCC)    left eye  . Shortness of breath dyspnea    with exertion    Past Surgical History:  Procedure Laterality Date  . BASCILIC VEIN TRANSPOSITION Right 02/07/2014   Procedure: FIRST STAGE BASILIC VEIN TRANSPOSITION;  Surgeon: Conrad Azalea Park, MD;  Location: [place];  Service: Vascular;  Laterality: Right;  . BASCILIC VEIN TRANSPOSITION Right 05/30/2014   Procedure: SECOND STAGE BASILIC VEIN TRANSPOSITION;  Surgeon: Conrad [place], MD;  Location: [place];  Service: Vascular;   Laterality: Right;  . BREAST SURGERY Left    nipple leaking, had surgery to fix the leak  . CARDIAC CATHETERIZATION     ? 10 years ago  . CHOLECYSTECTOMY    . COLONOSCOPY    . ORBITAL FRACTURE SURGERY    . OVARY SURGERY    . THYROIDECTOMY  2008  . TONSILLECTOMY    . TUBAL LIGATION      Social History   Social History  . Marital status: Widowed    Spouse name: N/A  . Number of children: N/A  . Years of education: N/A   Occupational History  . Not on file.   Social History Main Topics  . Smoking status: Former Smoker    Types: Cigarettes  . Smokeless tobacco: Never Used     Comment: "quit years ago"  . Alcohol use No  . Drug use: No  . Sexual activity: Not on file   Other Topics Concern  . Not on file   Social History Narrative  . No narrative on file    Current Outpatient Prescriptions on File Prior to Visit  Medication Sig Dispense Refill  . amLODipine (NORVASC) 10 MG tablet Take 10 mg by mouth daily.     Marland Kitchen aspirin (BAYER LOW STRENGTH) 81 MG EC tablet Take 81 mg by mouth daily.      Marland Kitchen atorvastatin (LIPITOR) 10 MG tablet Take 10 mg by mouth daily.    . cloNIDine (CATAPRES) 0.2 MG  tablet Take 0.1 mg by mouth 2 (two) times daily.     . fish oil-omega-3 fatty acids 1000 MG capsule Take 1 capsule by mouth daily.      Marland Kitchen glucose blood (ONETOUCH VERIO) test strip 1 each by Other route 2 (two) times daily. And lancets 2/day DX CODE E11.9 60 each 12  . labetalol (NORMODYNE) 100 MG tablet 2 in the morning 2 in the evening  2  . Lancets (ONETOUCH ULTRASOFT) lancets Use to check blood sugar 2 times per day 100 each 2  . levothyroxine (SYNTHROID, LEVOTHROID) 125 MCG tablet Take 1 tablet (125 mcg total) by mouth daily before breakfast. 90 tablet 3  . Multiple Vitamins-Minerals (MULTIVITAMIN WITH MINERALS) tablet Take 1 tablet by mouth daily.    . mycophenolate (MYFORTIC) 180 MG EC tablet Take 180 mg by mouth 2 (two) times daily.    . nitroGLYCERIN (NITROSTAT) 0.4 MG SL tablet  Place 1 tablet (0.4 mg total) under the tongue every 5 (five) minutes as needed. 25 tablet 5  . omeprazole (PRILOSEC) 20 MG capsule Take 20 mg by mouth daily.    . predniSONE (DELTASONE) 5 MG tablet Take 5 mg by mouth daily with breakfast.    . sulfamethoxazole-trimethoprim (BACTRIM,SEPTRA) 400-80 MG per tablet 1 tablet. On Monday Wednesday and Friday    . tacrolimus (PROGRAF) 1 MG capsule Take 5 mg by mouth. 6 tabs in the morning 6 tabs in the evening    . triazolam (HALCION) 0.25 MG tablet 2 tablets by mouth at bedtime      No current facility-administered medications on file prior to visit.     No Known Allergies  Family History  Problem Relation Age of Onset  . Cancer Other   . COPD Other   . Hypertension Other   . Hyperlipidemia Other   . Stroke Other   . Heart disease Other   . Diabetes Other   . Heart failure Mother   . Diabetes Mother   . Cancer Father     BP 126/70   Pulse 64   Ht 5\' 5"  (1.651 m)   Wt 205 lb (93 kg)   SpO2 98%   BMI 34.11 kg/m   Review of Systems Denies LOC    Objective:   Physical Exam   VITAL SIGNS:  See vs page GENERAL: no distress Neck: a healed scar is present.  i do not appreciate a nodule in the thyroid or elsewhere in the neck.   Pulses: dorsalis pedis intact bilat.   MSK: no deformity of the feet.   CV: no leg edema.   Skin:  no ulcer on the feet.  normal color and temp on the feet.  Neuro: sensation is intact to touch on the feet.     A1c=8.6%    Assessment & Plan:  Insulin-requiring type 2 DM, with renal failure.  She needs increased rx.  Hypothyroidism: recheck TSH.    Patient is advised the following: Patient Instructions  check your blood sugar twice a day.  vary the time of day when you check, between before the 3 meals, and at bedtime.  also check if you have symptoms of your blood sugar being too high or too low.  please keep a record of the readings and bring it to your next appointment here.  You can write it on any  piece of paper.  please call us sooner if your blood sugar goes below 70, or if you have a lot of readings over  200.  Please increase the insulin to 80 units each morning.  Please come back for a follow-up appointment in 3 months.   On this type of insulin schedule, you should eat meals on a regular schedule.  If a meal is missed or significantly delayed, your blood sugar could go low.

## 2016-01-17 ENCOUNTER — Telehealth: Payer: Self-pay

## 2016-01-17 ENCOUNTER — Other Ambulatory Visit: Payer: Self-pay | Admitting: Endocrinology

## 2016-01-17 DIAGNOSIS — Z940 Kidney transplant status: Secondary | ICD-10-CM | POA: Diagnosis not present

## 2016-01-17 DIAGNOSIS — E890 Postprocedural hypothyroidism: Secondary | ICD-10-CM | POA: Diagnosis not present

## 2016-01-17 MED ORDER — "INSULIN SYRINGE-NEEDLE U-100 31G X 5/16"" 0.3 ML MISC": 1 refills | Status: DC

## 2016-01-17 NOTE — Telephone Encounter (Signed)
TSH order faxed to the lab.

## 2016-01-17 NOTE — Telephone Encounter (Signed)
Patient is at [facility] and her lab orders are not in. Please send she is there waiting.  Phone # [phone]  Fax # [phone]

## 2016-01-18 LAB — TSH: TSH: 2.81 u[IU]/mL (ref 0.450–4.500)

## 2016-01-23 ENCOUNTER — Telehealth: Payer: Self-pay | Admitting: Endocrinology

## 2016-01-23 NOTE — Telephone Encounter (Signed)
Patient advised of this message on 01/22/2016 based of the [place] message. Patient voiced understanding and had no further questions at this time.

## 2016-01-23 NOTE — Telephone Encounter (Signed)
please call patient: Thyroid is normal--good. Please continue the same thyroid medication. I'll see you next time.

## 2016-01-25 DIAGNOSIS — Z940 Kidney transplant status: Secondary | ICD-10-CM | POA: Diagnosis not present

## 2016-02-13 ENCOUNTER — Telehealth: Payer: Self-pay | Admitting: Endocrinology

## 2016-02-13 DIAGNOSIS — R499 Unspecified voice and resonance disorder: Secondary | ICD-10-CM | POA: Diagnosis not present

## 2016-02-13 DIAGNOSIS — M542 Cervicalgia: Secondary | ICD-10-CM | POA: Diagnosis not present

## 2016-02-13 DIAGNOSIS — I10 Essential (primary) hypertension: Secondary | ICD-10-CM | POA: Diagnosis not present

## 2016-02-13 MED ORDER — LEVOTHYROXINE SODIUM 125 MCG PO TABS: 125.0000 ug | ORAL_TABLET | Freq: Every day | ORAL | 3 refills | Status: DC

## 2016-02-13 NOTE — Telephone Encounter (Signed)
Pt needs her Levothyroxine sent to [facility] in [facility]

## 2016-02-13 NOTE — Telephone Encounter (Signed)
Refill submitted per patient's request.  

## 2016-02-18 DIAGNOSIS — G4730 Sleep apnea, unspecified: Secondary | ICD-10-CM | POA: Diagnosis not present

## 2016-02-18 DIAGNOSIS — Z111 Encounter for screening for respiratory tuberculosis: Secondary | ICD-10-CM | POA: Diagnosis not present

## 2016-02-19 ENCOUNTER — Telehealth: Payer: Self-pay | Admitting: Endocrinology

## 2016-02-19 DIAGNOSIS — I10 Essential (primary) hypertension: Secondary | ICD-10-CM | POA: Diagnosis not present

## 2016-02-19 DIAGNOSIS — F419 Anxiety disorder, unspecified: Secondary | ICD-10-CM | POA: Diagnosis not present

## 2016-02-19 DIAGNOSIS — B349 Viral infection, unspecified: Secondary | ICD-10-CM | POA: Diagnosis not present

## 2016-02-19 DIAGNOSIS — I770 Arteriovenous fistula, acquired: Secondary | ICD-10-CM | POA: Diagnosis not present

## 2016-02-19 DIAGNOSIS — N179 Acute kidney failure, unspecified: Secondary | ICD-10-CM | POA: Diagnosis not present

## 2016-02-19 DIAGNOSIS — Z940 Kidney transplant status: Secondary | ICD-10-CM | POA: Diagnosis not present

## 2016-02-19 DIAGNOSIS — M7989 Other specified soft tissue disorders: Secondary | ICD-10-CM | POA: Diagnosis not present

## 2016-02-19 DIAGNOSIS — F450 Somatization disorder: Secondary | ICD-10-CM | POA: Diagnosis not present

## 2016-02-19 NOTE — Telephone Encounter (Signed)
I contacted the patient and advised of message. Patient will decrease from 80 units to 70.

## 2016-02-19 NOTE — Telephone Encounter (Signed)
Please verify insulin is 80 units qam Then please reduce to 70 units each morning.

## 2016-02-19 NOTE — Telephone Encounter (Signed)
See message and please advise, Thanks!  

## 2016-02-19 NOTE — Telephone Encounter (Signed)
Pt feels like she needs to reduce her insulin  At 3 am she has been having many episodes of very low in the 50s bs readings, please advise

## 2016-02-20 DIAGNOSIS — B349 Viral infection, unspecified: Secondary | ICD-10-CM | POA: Diagnosis not present

## 2016-02-20 DIAGNOSIS — Z940 Kidney transplant status: Secondary | ICD-10-CM | POA: Diagnosis not present

## 2016-02-22 DIAGNOSIS — G4730 Sleep apnea, unspecified: Secondary | ICD-10-CM | POA: Diagnosis not present

## 2016-02-23 DIAGNOSIS — G4730 Sleep apnea, unspecified: Secondary | ICD-10-CM | POA: Diagnosis not present

## 2016-02-25 DIAGNOSIS — G4730 Sleep apnea, unspecified: Secondary | ICD-10-CM | POA: Diagnosis not present

## 2016-03-05 DIAGNOSIS — Z940 Kidney transplant status: Secondary | ICD-10-CM | POA: Diagnosis not present

## 2016-03-20 ENCOUNTER — Other Ambulatory Visit: Payer: Self-pay

## 2016-03-20 ENCOUNTER — Other Ambulatory Visit: Payer: Self-pay | Admitting: Endocrinology

## 2016-03-20 DIAGNOSIS — N185 Chronic kidney disease, stage 5: Secondary | ICD-10-CM | POA: Diagnosis not present

## 2016-03-20 MED ORDER — GLUCOSE BLOOD VI STRP: ORAL_STRIP | 3 refills | Status: DC

## 2016-04-03 DIAGNOSIS — Z940 Kidney transplant status: Secondary | ICD-10-CM | POA: Diagnosis not present

## 2016-04-03 DIAGNOSIS — G4700 Insomnia, unspecified: Secondary | ICD-10-CM | POA: Diagnosis not present

## 2016-04-03 DIAGNOSIS — E1122 Type 2 diabetes mellitus with diabetic chronic kidney disease: Secondary | ICD-10-CM | POA: Diagnosis not present

## 2016-04-03 DIAGNOSIS — N183 Chronic kidney disease, stage 3 (moderate): Secondary | ICD-10-CM | POA: Diagnosis not present

## 2016-04-03 DIAGNOSIS — F3341 Major depressive disorder, recurrent, in partial remission: Secondary | ICD-10-CM | POA: Diagnosis not present

## 2016-04-03 DIAGNOSIS — I2510 Atherosclerotic heart disease of native coronary artery without angina pectoris: Secondary | ICD-10-CM | POA: Diagnosis not present

## 2016-04-03 DIAGNOSIS — E039 Hypothyroidism, unspecified: Secondary | ICD-10-CM | POA: Diagnosis not present

## 2016-04-03 DIAGNOSIS — R609 Edema, unspecified: Secondary | ICD-10-CM | POA: Diagnosis not present

## 2016-04-03 DIAGNOSIS — I129 Hypertensive chronic kidney disease with stage 1 through stage 4 chronic kidney disease, or unspecified chronic kidney disease: Secondary | ICD-10-CM | POA: Diagnosis not present

## 2016-04-03 DIAGNOSIS — E782 Mixed hyperlipidemia: Secondary | ICD-10-CM | POA: Diagnosis not present

## 2016-04-17 ENCOUNTER — Ambulatory Visit (INDEPENDENT_AMBULATORY_CARE_PROVIDER_SITE_OTHER): Payer: Medicare Other | Admitting: Endocrinology

## 2016-04-17 ENCOUNTER — Encounter: Payer: Self-pay | Admitting: Endocrinology

## 2016-04-17 VITALS — BP 124/74 | HR 62 | Wt 201.4 lb

## 2016-04-17 DIAGNOSIS — E1122 Type 2 diabetes mellitus with diabetic chronic kidney disease: Secondary | ICD-10-CM | POA: Diagnosis not present

## 2016-04-17 DIAGNOSIS — Z794 Long term (current) use of insulin: Secondary | ICD-10-CM

## 2016-04-17 DIAGNOSIS — N185 Chronic kidney disease, stage 5: Secondary | ICD-10-CM | POA: Diagnosis not present

## 2016-04-17 LAB — POCT GLYCOSYLATED HEMOGLOBIN (HGB A1C): Hemoglobin A1C: 8

## 2016-04-17 MED ORDER — INSULIN NPH (HUMAN) (ISOPHANE) 100 UNIT/ML ~~LOC~~ SUSP: 35.0000 [IU] | SUBCUTANEOUS | 11 refills | Status: DC

## 2016-04-17 MED ORDER — INSULIN REGULAR HUMAN 100 UNIT/ML IJ SOLN: 35.0000 [IU] | Freq: Every day | INTRAMUSCULAR | 11 refills | Status: DC

## 2016-04-17 NOTE — Patient Instructions (Addendum)
check your blood sugar twice a day.  vary the time of day when you check, between before the 3 meals, and at bedtime.  also check if you have symptoms of your blood sugar being too high or too low.  please keep a record of the readings and bring it to your next appointment here.  You can write it on any piece of paper.  please call us sooner if your blood sugar goes below 70, or if you have a lot of readings over 200.  Please change the insulin to 35 units of each of 2 insulins: R and N (both humulin), both with breakfast.   Please come back for a follow-up appointment in 3 months.   On this type of insulin schedule, you should eat meals on a regular schedule.  If a meal is missed or significantly delayed, your blood sugar could go low.

## 2016-04-17 NOTE — Progress Notes (Signed)
Pre visit review using our clinic review tool, if applicable. No additional management support is needed unless otherwise documented below in the visit note. 

## 2016-04-17 NOTE — Progress Notes (Signed)
Subjective:    Patient ID: Sandy Roy, female    DOB: 03/30/1950, 66 y.o.   MRN: [id number]  HPI Pt returns for f/u of diabetes mellitus: DM type: insulin-requiring type 2 Dx'ed: 0539 Complications: CAD and renal failure (transplant 2016).   Therapy: insulin since 2015.   GDM: never.   DKA: never Severe hypoglycemia: never.   Pancreatitis: never.   Other info: she has requested a QD, inexpensive insulin regimen; she is chronically on low-dose prednisone.   Interval history: She seldom has hypoglycemia, and these episodes are mild.  This happens in the early hrs of the AM.  She sometimes forgets the insulin until later in the day, but says she has hypoglycemia in the middle of the night even when she takes insulin with breakfast.  She takes 60 units qam pt had thyroidectomy for 2 small foci of papillary cancer (2008).  foci were not large enough to warrant RAI.  Past Medical History:  Diagnosis Date  . Chronic kidney disease    ESRD-  Hemo- M_W_F  . CONGESTIVE HEART FAILURE 12/14/2006  . CORONARY ARTERY DISEASE 12/14/2006  . Depression    not recently  . DIABETES MELLITUS, TYPE II 12/14/2006   Type 2  . HYPERLIPIDEMIA 12/14/2006  . HYPERTENSION 12/14/2006  . Hypoparathyroidism ([place]) 12/21/2006  . Hypopotassemia 01/13/2007  . HYPOTHYROIDISM, POSTSURGICAL 12/15/2006  . NEOPLASM, MALIGNANT, THYROID GLAND 12/15/2006   Stage 1 papillary adenocarcinoma, one very small focus each lobe (55mm, 85mm)  6/09: CT neck: no evidence of recurrence  . OBSTRUCTIVE SLEEP APNEA 02/23/2007   does not use a cpap  . Orbital fracture (HCC)    left eye  . Shortness of breath dyspnea    with exertion    Past Surgical History:  Procedure Laterality Date  . BASCILIC VEIN TRANSPOSITION Right 02/07/2014   Procedure: FIRST STAGE BASILIC VEIN TRANSPOSITION;  Surgeon: Conrad Stafford, MD;  Location: [place];  Service: Vascular;  Laterality: Right;  . BASCILIC VEIN TRANSPOSITION Right 05/30/2014   Procedure:  SECOND STAGE BASILIC VEIN TRANSPOSITION;  Surgeon: Conrad Hoagland, MD;  Location: [place];  Service: Vascular;  Laterality: Right;  . BREAST SURGERY Left    nipple leaking, had surgery to fix the leak  . CARDIAC CATHETERIZATION     ? 10 years ago  . CHOLECYSTECTOMY    . COLONOSCOPY    . ORBITAL FRACTURE SURGERY    . OVARY SURGERY    . THYROIDECTOMY  2008  . TONSILLECTOMY    . TUBAL LIGATION      Social History   Social History  . Marital status: Widowed    Spouse name: N/A  . Number of children: N/A  . Years of education: N/A   Occupational History  . Not on file.   Social History Main Topics  . Smoking status: Former Smoker    Types: Cigarettes  . Smokeless tobacco: Never Used     Comment: "quit years ago"  . Alcohol use No  . Drug use: No  . Sexual activity: Not on file   Other Topics Concern  . Not on file   Social History Narrative  . No narrative on file    Current Outpatient Prescriptions on File Prior to Visit  Medication Sig Dispense Refill  . amLODipine (NORVASC) 10 MG tablet Take 10 mg by mouth daily.     Marland Kitchen aspirin (BAYER LOW STRENGTH) 81 MG EC tablet Take 81 mg by mouth daily.      Marland Kitchen  atorvastatin (LIPITOR) 10 MG tablet Take 10 mg by mouth daily.    . cloNIDine (CATAPRES) 0.2 MG tablet Take 0.1 mg by mouth 2 (two) times daily.     . fish oil-omega-3 fatty acids 1000 MG capsule Take 1 capsule by mouth daily.      Marland Kitchen glucose blood (ONETOUCH VERIO) test strip USE ONE STRIP TO CHECK GLUCOSE TWICE DAILY Dx code: E11.9 200 each 3  . Insulin Syringe-Needle U-100 (B-D INSULIN SYRINGE) 31G X 5/16" 0.3 ML MISC Use 1 time per day. 100 each 1  . labetalol (NORMODYNE) 100 MG tablet 2 in the morning 2 in the evening  2  . Lancets (ONETOUCH ULTRASOFT) lancets Use to check blood sugar 2 times per day 100 each 2  . levothyroxine (SYNTHROID, LEVOTHROID) 125 MCG tablet Take 1 tablet (125 mcg total) by mouth daily before breakfast. 90 tablet 3  . Multiple Vitamins-Minerals  (MULTIVITAMIN WITH MINERALS) tablet Take 1 tablet by mouth daily.    . mycophenolate (MYFORTIC) 180 MG EC tablet Take 180 mg by mouth 2 (two) times daily.    . nitroGLYCERIN (NITROSTAT) 0.4 MG SL tablet Place 1 tablet (0.4 mg total) under the tongue every 5 (five) minutes as needed. 25 tablet 5  . omeprazole (PRILOSEC) 20 MG capsule Take 20 mg by mouth daily.    . predniSONE (DELTASONE) 5 MG tablet Take 5 mg by mouth daily with breakfast.    . sulfamethoxazole-trimethoprim (BACTRIM,SEPTRA) 400-80 MG per tablet 1 tablet. On Monday Wednesday and Friday    . tacrolimus (PROGRAF) 1 MG capsule Take 5 mg by mouth. 6 tabs in the morning 6 tabs in the evening    . triazolam (HALCION) 0.25 MG tablet 2 tablets by mouth at bedtime     . zinc gluconate 50 MG tablet Take 50 mg by mouth daily.     No current facility-administered medications on file prior to visit.     No Known Allergies  Family History  Problem Relation Age of Onset  . Cancer Other   . COPD Other   . Hypertension Other   . Hyperlipidemia Other   . Stroke Other   . Heart disease Other   . Diabetes Other   . Heart failure Mother   . Diabetes Mother   . Cancer Father     BP 124/74 (BP Location: Left Arm, Patient Position: Sitting, Cuff Size: Normal)   Pulse 62   Wt 201 lb 6.4 oz (91.4 kg)   SpO2 98%   BMI 33.51 kg/m    Review of Systems Denies LOC    Objective:   Physical Exam VITAL SIGNS:  See vs page GENERAL: no distress   Pulses: dorsalis pedis intact bilat.   MSK: no deformity of the feet.   CV: no leg edema.   Skin:  no ulcer on the feet.  normal color and temp on the feet.  Neuro: sensation is intact to touch on the feet.   A1c=8.0%    Assessment & Plan:  Insulin-requiring type 2 DM: Based on the pattern of her cbg's, she needs her insulin to work more later in the day.   Patient is advised the following: Patient Instructions  check your blood sugar twice a day.  vary the time of day when you check,  between before the 3 meals, and at bedtime.  also check if you have symptoms of your blood sugar being too high or too low.  please keep a record of the readings and  bring it to your next appointment here.  You can write it on any piece of paper.  please call us sooner if your blood sugar goes below 70, or if you have a lot of readings over 200.  Please change the insulin to 35 units of each of 2 insulins: R and N (both humulin), both with breakfast.   Please come back for a follow-up appointment in 3 months.   On this type of insulin schedule, you should eat meals on a regular schedule.  If a meal is missed or significantly delayed, your blood sugar could go low.

## 2016-04-18 ENCOUNTER — Telehealth: Payer: Self-pay | Admitting: Endocrinology

## 2016-04-18 MED ORDER — INSULIN NPH (HUMAN) (ISOPHANE) 100 UNIT/ML ~~LOC~~ SUSP: 35.0000 [IU] | SUBCUTANEOUS | 11 refills | Status: DC

## 2016-04-18 MED ORDER — INSULIN REGULAR HUMAN 100 UNIT/ML IJ SOLN: 35.0000 [IU] | Freq: Every day | INTRAMUSCULAR | 11 refills | Status: DC

## 2016-04-18 NOTE — Telephone Encounter (Signed)
[facility] did not receive the rx we faxed yesterday, she is unaware of what it is supposed to be but thinks it has to do with insulin  [phone] fax to this number

## 2016-04-18 NOTE — Telephone Encounter (Signed)
Humulin N and R resubmitted.

## 2016-04-23 ENCOUNTER — Telehealth: Payer: Self-pay | Admitting: Endocrinology

## 2016-04-23 NOTE — Telephone Encounter (Signed)
Patient is calling on the status of her insulin, Humulin R and Humulin N, do she take it at the same time, or at a different time? Please advise

## 2016-04-23 NOTE — Telephone Encounter (Signed)
I contacted the patient and advised to take the Hunulin R with breakfast and and the Humulin N 35 to 45 after the Humulin R. Pateint voiced understanding and had no further questions.

## 2016-04-24 NOTE — Telephone Encounter (Signed)
Pt's sugars are not doing well since the new insulin the R and the N. She started yesterday.   2/14 PM shortly after dinner 292 2/15 730 AM 203; had breakfast took the N then one hour later took the R, just took the BS now and the BS was 362  Please advise

## 2016-04-24 NOTE — Telephone Encounter (Signed)
Could you review message and please advise during Dr. Cordelia Pen absence? Thanks!

## 2016-04-24 NOTE — Telephone Encounter (Signed)
please call patient: Please increase both insulins to 45 units with breakfast.

## 2016-04-24 NOTE — Telephone Encounter (Signed)
I contacted the patient and advised of message. Patient voiced understanding.  

## 2016-05-13 DIAGNOSIS — Z940 Kidney transplant status: Secondary | ICD-10-CM | POA: Diagnosis not present

## 2016-05-20 DIAGNOSIS — I770 Arteriovenous fistula, acquired: Secondary | ICD-10-CM | POA: Diagnosis not present

## 2016-05-20 DIAGNOSIS — F419 Anxiety disorder, unspecified: Secondary | ICD-10-CM | POA: Diagnosis not present

## 2016-05-20 DIAGNOSIS — Z940 Kidney transplant status: Secondary | ICD-10-CM | POA: Diagnosis not present

## 2016-05-20 DIAGNOSIS — I10 Essential (primary) hypertension: Secondary | ICD-10-CM | POA: Diagnosis not present

## 2016-05-20 DIAGNOSIS — B349 Viral infection, unspecified: Secondary | ICD-10-CM | POA: Diagnosis not present

## 2016-05-20 DIAGNOSIS — M7989 Other specified soft tissue disorders: Secondary | ICD-10-CM | POA: Diagnosis not present

## 2016-05-20 DIAGNOSIS — R7989 Other specified abnormal findings of blood chemistry: Secondary | ICD-10-CM | POA: Diagnosis not present

## 2016-05-20 DIAGNOSIS — F450 Somatization disorder: Secondary | ICD-10-CM | POA: Diagnosis not present

## 2016-05-21 DIAGNOSIS — Z940 Kidney transplant status: Secondary | ICD-10-CM | POA: Diagnosis not present

## 2016-05-21 DIAGNOSIS — B349 Viral infection, unspecified: Secondary | ICD-10-CM | POA: Diagnosis not present

## 2016-07-15 ENCOUNTER — Ambulatory Visit (INDEPENDENT_AMBULATORY_CARE_PROVIDER_SITE_OTHER): Payer: Medicare Other | Admitting: Endocrinology

## 2016-07-15 ENCOUNTER — Encounter: Payer: Self-pay | Admitting: Endocrinology

## 2016-07-15 VITALS — BP 124/72 | HR 68 | Ht 63.0 in | Wt 195.0 lb

## 2016-07-15 DIAGNOSIS — N185 Chronic kidney disease, stage 5: Secondary | ICD-10-CM

## 2016-07-15 DIAGNOSIS — E1122 Type 2 diabetes mellitus with diabetic chronic kidney disease: Secondary | ICD-10-CM

## 2016-07-15 DIAGNOSIS — Z794 Long term (current) use of insulin: Secondary | ICD-10-CM | POA: Diagnosis not present

## 2016-07-15 LAB — POCT GLYCOSYLATED HEMOGLOBIN (HGB A1C): HEMOGLOBIN A1C: 7.7

## 2016-07-15 NOTE — Progress Notes (Signed)
Subjective:    Patient ID: Sandy Roy, female    DOB: [birth year], 66 y.o.   MRN: [id number]  HPI Pt returns for f/u of diabetes mellitus: DM type: insulin-requiring type 2 Dx'ed: 8341 Complications: CAD and renal failure (transplant 2016).   Therapy: insulin since 2015.   GDM: never.   DKA: never Severe hypoglycemia: never.   Pancreatitis: never.   Other: she has requested a QD, inexpensive insulin regimen; she is chronically on low-dose prednisone; the pattern of her cbg's indicates she needs R and N QAM, but no insulin later in the day.  Interval history: She seldom has hypoglycemia, and these episodes are mild.  This happens in the afternoon, when she misses lunch.  She seldom misses the insulin.  no cbg record, but states cbg's are highest in the afternoon.  It is seldom over 200 pt had thyroidectomy for 2 small foci of papillary cancer (2008).  foci were not large enough to warrant RAI.  Past Medical History:  Diagnosis Date  . Chronic kidney disease    ESRD-  Hemo- M_W_F  . CONGESTIVE HEART FAILURE 12/14/2006  . CORONARY ARTERY DISEASE 12/14/2006  . Depression    not recently  . DIABETES MELLITUS, TYPE II 12/14/2006   Type 2  . HYPERLIPIDEMIA 12/14/2006  . HYPERTENSION 12/14/2006  . Hypoparathyroidism ([place]) 12/21/2006  . Hypopotassemia 01/13/2007  . HYPOTHYROIDISM, POSTSURGICAL 12/15/2006  . NEOPLASM, MALIGNANT, THYROID GLAND 12/15/2006   Stage 1 papillary adenocarcinoma, one very small focus each lobe (80mm, 54mm)  6/09: CT neck: no evidence of recurrence  . OBSTRUCTIVE SLEEP APNEA 02/23/2007   does not use a cpap  . Orbital fracture (HCC)    left eye  . Shortness of breath dyspnea    with exertion    Past Surgical History:  Procedure Laterality Date  . BASCILIC VEIN TRANSPOSITION Right 02/07/2014   Procedure: FIRST STAGE BASILIC VEIN TRANSPOSITION;  Surgeon: Conrad Crofton, MD;  Location: [place];  Service: Vascular;  Laterality: Right;  . BASCILIC VEIN TRANSPOSITION  Right 05/30/2014   Procedure: SECOND STAGE BASILIC VEIN TRANSPOSITION;  Surgeon: Conrad [place], MD;  Location: [place];  Service: Vascular;  Laterality: Right;  . BREAST SURGERY Left    nipple leaking, had surgery to fix the leak  . CARDIAC CATHETERIZATION     ? 10 years ago  . CHOLECYSTECTOMY    . COLONOSCOPY    . ORBITAL FRACTURE SURGERY    . OVARY SURGERY    . THYROIDECTOMY  2008  . TONSILLECTOMY    . TUBAL LIGATION      Social History   Social History  . Marital status: Widowed    Spouse name: N/A  . Number of children: N/A  . Years of education: N/A   Occupational History  . Not on file.   Social History Main Topics  . Smoking status: Former Smoker    Types: Cigarettes  . Smokeless tobacco: Never Used     Comment: "quit years ago"  . Alcohol use No  . Drug use: No  . Sexual activity: Not on file   Other Topics Concern  . Not on file   Social History Narrative  . No narrative on file    Current Outpatient Prescriptions on File Prior to Visit  Medication Sig Dispense Refill  . amLODipine (NORVASC) 10 MG tablet Take 10 mg by mouth daily.     Marland Kitchen aspirin (BAYER LOW STRENGTH) 81 MG EC tablet Take 81 mg by mouth  daily.      . atorvastatin (LIPITOR) 10 MG tablet Take 10 mg by mouth daily.    Marland Kitchen CALCIUM PO Take by mouth.    . cloNIDine (CATAPRES) 0.2 MG tablet Take 0.1 mg by mouth 2 (two) times daily.     . fish oil-omega-3 fatty acids 1000 MG capsule Take 1 capsule by mouth daily.      Marland Kitchen glucose blood (ONETOUCH VERIO) test strip USE ONE STRIP TO CHECK GLUCOSE TWICE DAILY Dx code: E11.9 200 each 3  . insulin NPH Human (HUMULIN N) 100 UNIT/ML injection Inject 0.35 mLs (35 Units total) into the skin every morning. 10 mL 11  . insulin regular (HUMULIN R) 250 units/2.65mL (100 units/mL) injection Inject 0.35 mLs (35 Units total) into the skin daily with breakfast. 10 mL 11  . Insulin Syringe-Needle U-100 (B-D INSULIN SYRINGE) 31G X 5/16" 0.3 ML MISC Use 1 time per day. 100 each  1  . labetalol (NORMODYNE) 100 MG tablet 2 in the morning 2 in the evening  2  . Lancets (ONETOUCH ULTRASOFT) lancets Use to check blood sugar 2 times per day 100 each 2  . levothyroxine (SYNTHROID, LEVOTHROID) 125 MCG tablet Take 1 tablet (125 mcg total) by mouth daily before breakfast. 90 tablet 3  . Multiple Vitamins-Minerals (MULTIVITAMIN WITH MINERALS) tablet Take 1 tablet by mouth daily.    . mycophenolate (MYFORTIC) 180 MG EC tablet Take 180 mg by mouth 2 (two) times daily.    . nitroGLYCERIN (NITROSTAT) 0.4 MG SL tablet Place 1 tablet (0.4 mg total) under the tongue every 5 (five) minutes as needed. 25 tablet 5  . omeprazole (PRILOSEC) 20 MG capsule Take 20 mg by mouth daily.    . predniSONE (DELTASONE) 5 MG tablet Take 5 mg by mouth daily with breakfast.    . sertraline (ZOLOFT) 50 MG tablet Take 50 mg by mouth daily.    Marland Kitchen sulfamethoxazole-trimethoprim (BACTRIM,SEPTRA) 400-80 MG per tablet 1 tablet. On Monday Wednesday and Friday    . tacrolimus (PROGRAF) 1 MG capsule Take 5 mg by mouth. 6 tabs in the morning 6 tabs in the evening    . triazolam (HALCION) 0.25 MG tablet 2 tablets by mouth at bedtime     . zinc gluconate 50 MG tablet Take 50 mg by mouth daily.     No current facility-administered medications on file prior to visit.     No Known Allergies  Family History  Problem Relation Age of Onset  . Cancer Other   . COPD Other   . Hypertension Other   . Hyperlipidemia Other   . Stroke Other   . Heart disease Other   . Diabetes Other   . Heart failure Mother   . Diabetes Mother   . Cancer Father     BP 124/72   Pulse 68   Ht 5\' 3"  (1.6 m)   Wt 195 lb (88.5 kg)   SpO2 97%   BMI 34.54 kg/m     Review of Systems Denies LOC    Objective:   Physical Exam VITAL SIGNS:  See vs page GENERAL: no distress Neck: a healed scar is present.  I do not appreciate a nodule in the thyroid or elsewhere in the neck Pulses: dorsalis pedis intact bilat.   MSK: no deformity  of the feet CV: no leg edema Skin:  no ulcer on the feet.  normal color and temp on the feet. Neuro: sensation is intact to touch on the feet.  Ext: There is bilateral onychomycosis of the toenails  Lab Results  Component Value Date   HGBA1C 7.7 07/15/2016   outside test results are reviewed: Creat=1.3    Assessment & Plan:  Insulin-requiring type 2 DM, with CAD: this is the best control this pt should aim for, given this regimen, which does match insulin to her changing needs throughout the day.   Renal failure: she is at risk for hypoglycemia  Patient Instructions  check your blood sugar twice a day.  vary the time of day when you check, between before the 3 meals, and at bedtime.  also check if you have symptoms of your blood sugar being too high or too low.  please keep a record of the readings and bring it to your next appointment here.  You can write it on any piece of paper.  please call us sooner if your blood sugar goes below 70, or if you have a lot of readings over 200.  Please continue the same insulins.  Please come back for a follow-up appointment in 4 months.   On this type of insulin schedule, you should eat meals on a regular schedule.  If a meal is missed or significantly delayed, your blood sugar could go low.

## 2016-07-15 NOTE — Patient Instructions (Addendum)
check your blood sugar twice a day.  vary the time of day when you check, between before the 3 meals, and at bedtime.  also check if you have symptoms of your blood sugar being too high or too low.  please keep a record of the readings and bring it to your next appointment here.  You can write it on any piece of paper.  please call us sooner if your blood sugar goes below 70, or if you have a lot of readings over 200.  Please continue the same insulins.  Please come back for a follow-up appointment in 4 months.   On this type of insulin schedule, you should eat meals on a regular schedule.  If a meal is missed or significantly delayed, your blood sugar could go low.

## 2016-07-24 DIAGNOSIS — Z01411 Encounter for gynecological examination (general) (routine) with abnormal findings: Secondary | ICD-10-CM | POA: Diagnosis not present

## 2016-07-24 DIAGNOSIS — Z13820 Encounter for screening for osteoporosis: Secondary | ICD-10-CM | POA: Diagnosis not present

## 2016-08-12 IMAGING — CR DG CHEST 2V
2 series · 2 of 2 positions shown · non-contrast
Comparison: 09/15/2006

CLINICAL DATA: Weakness 2 weeks.

EXAM:
CHEST  2 VIEW

[w chest pa]
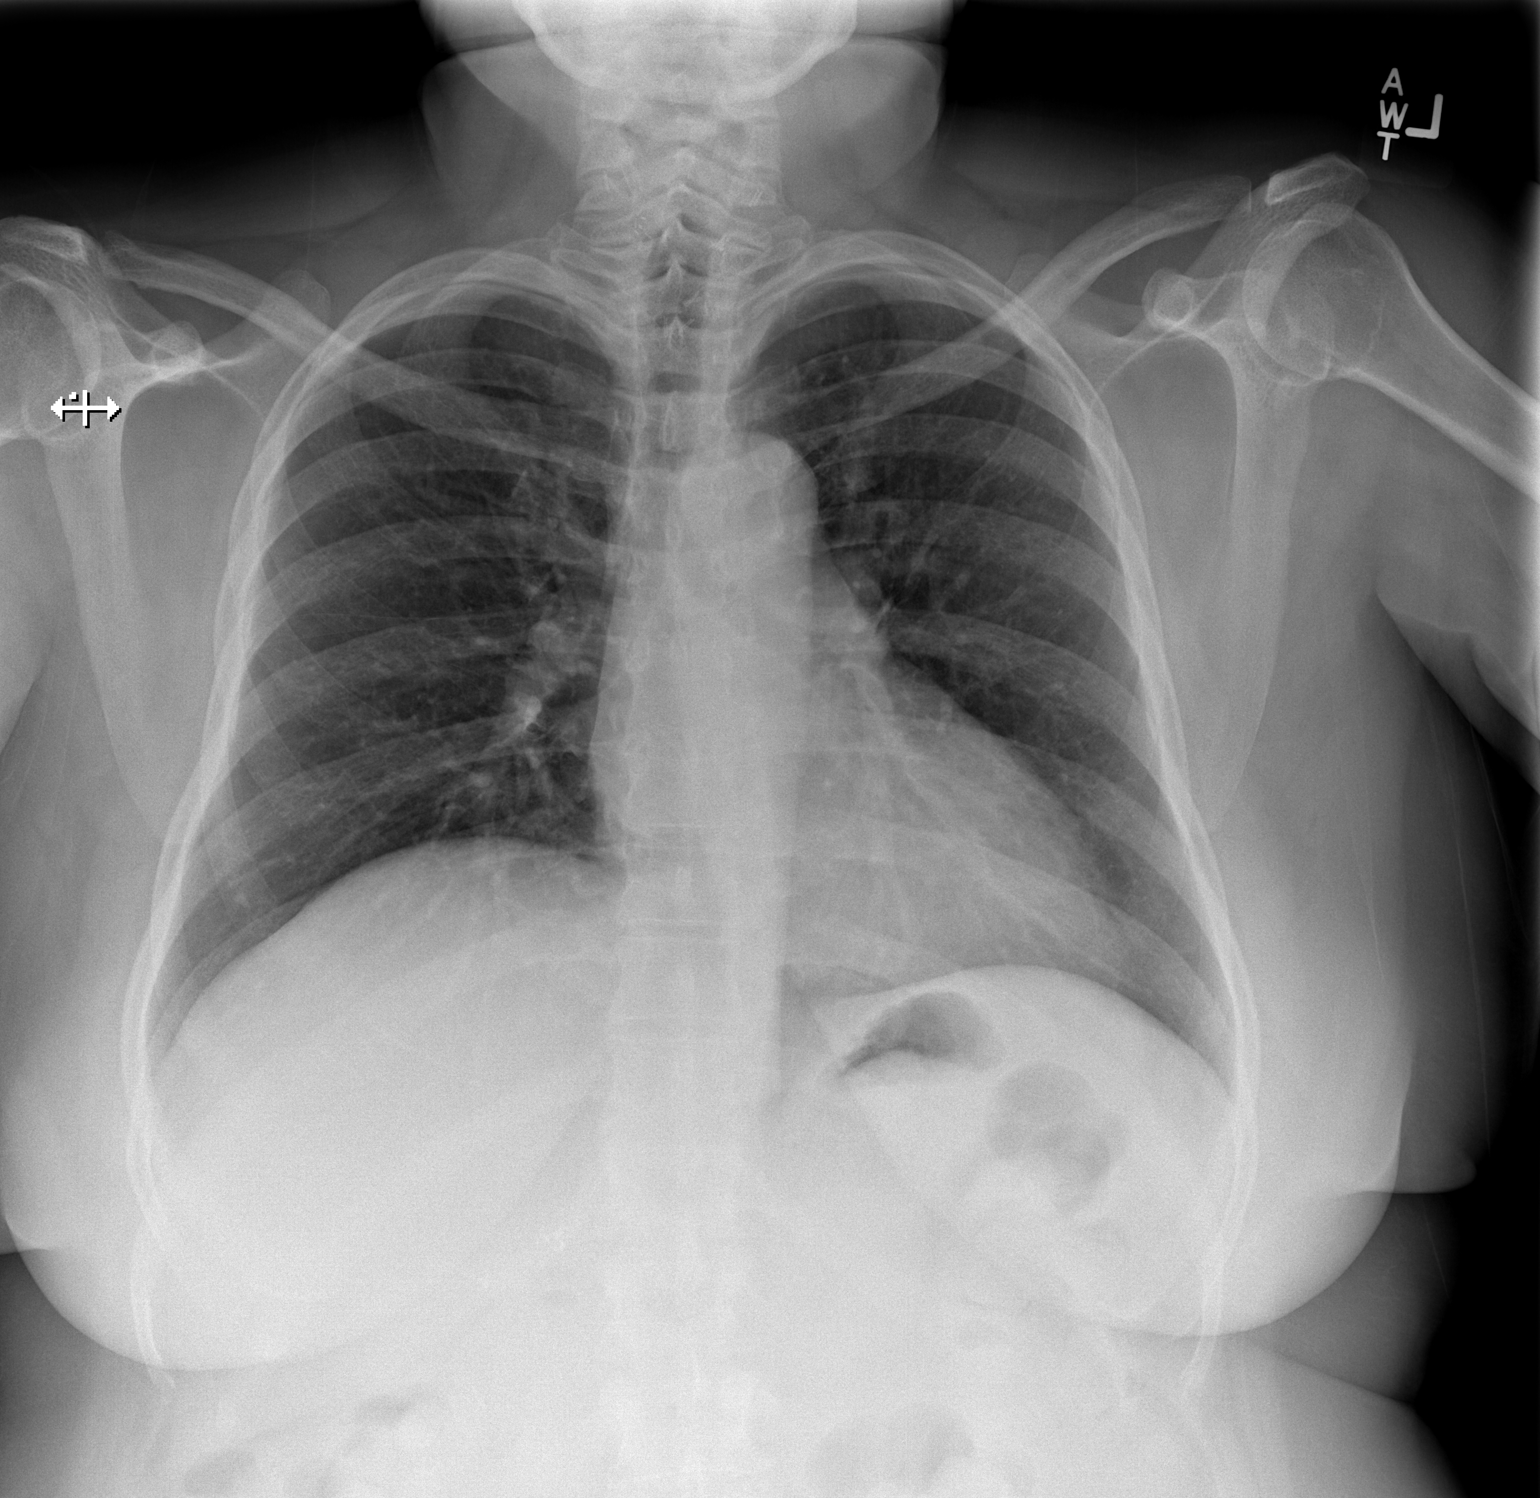

[w chest lat]
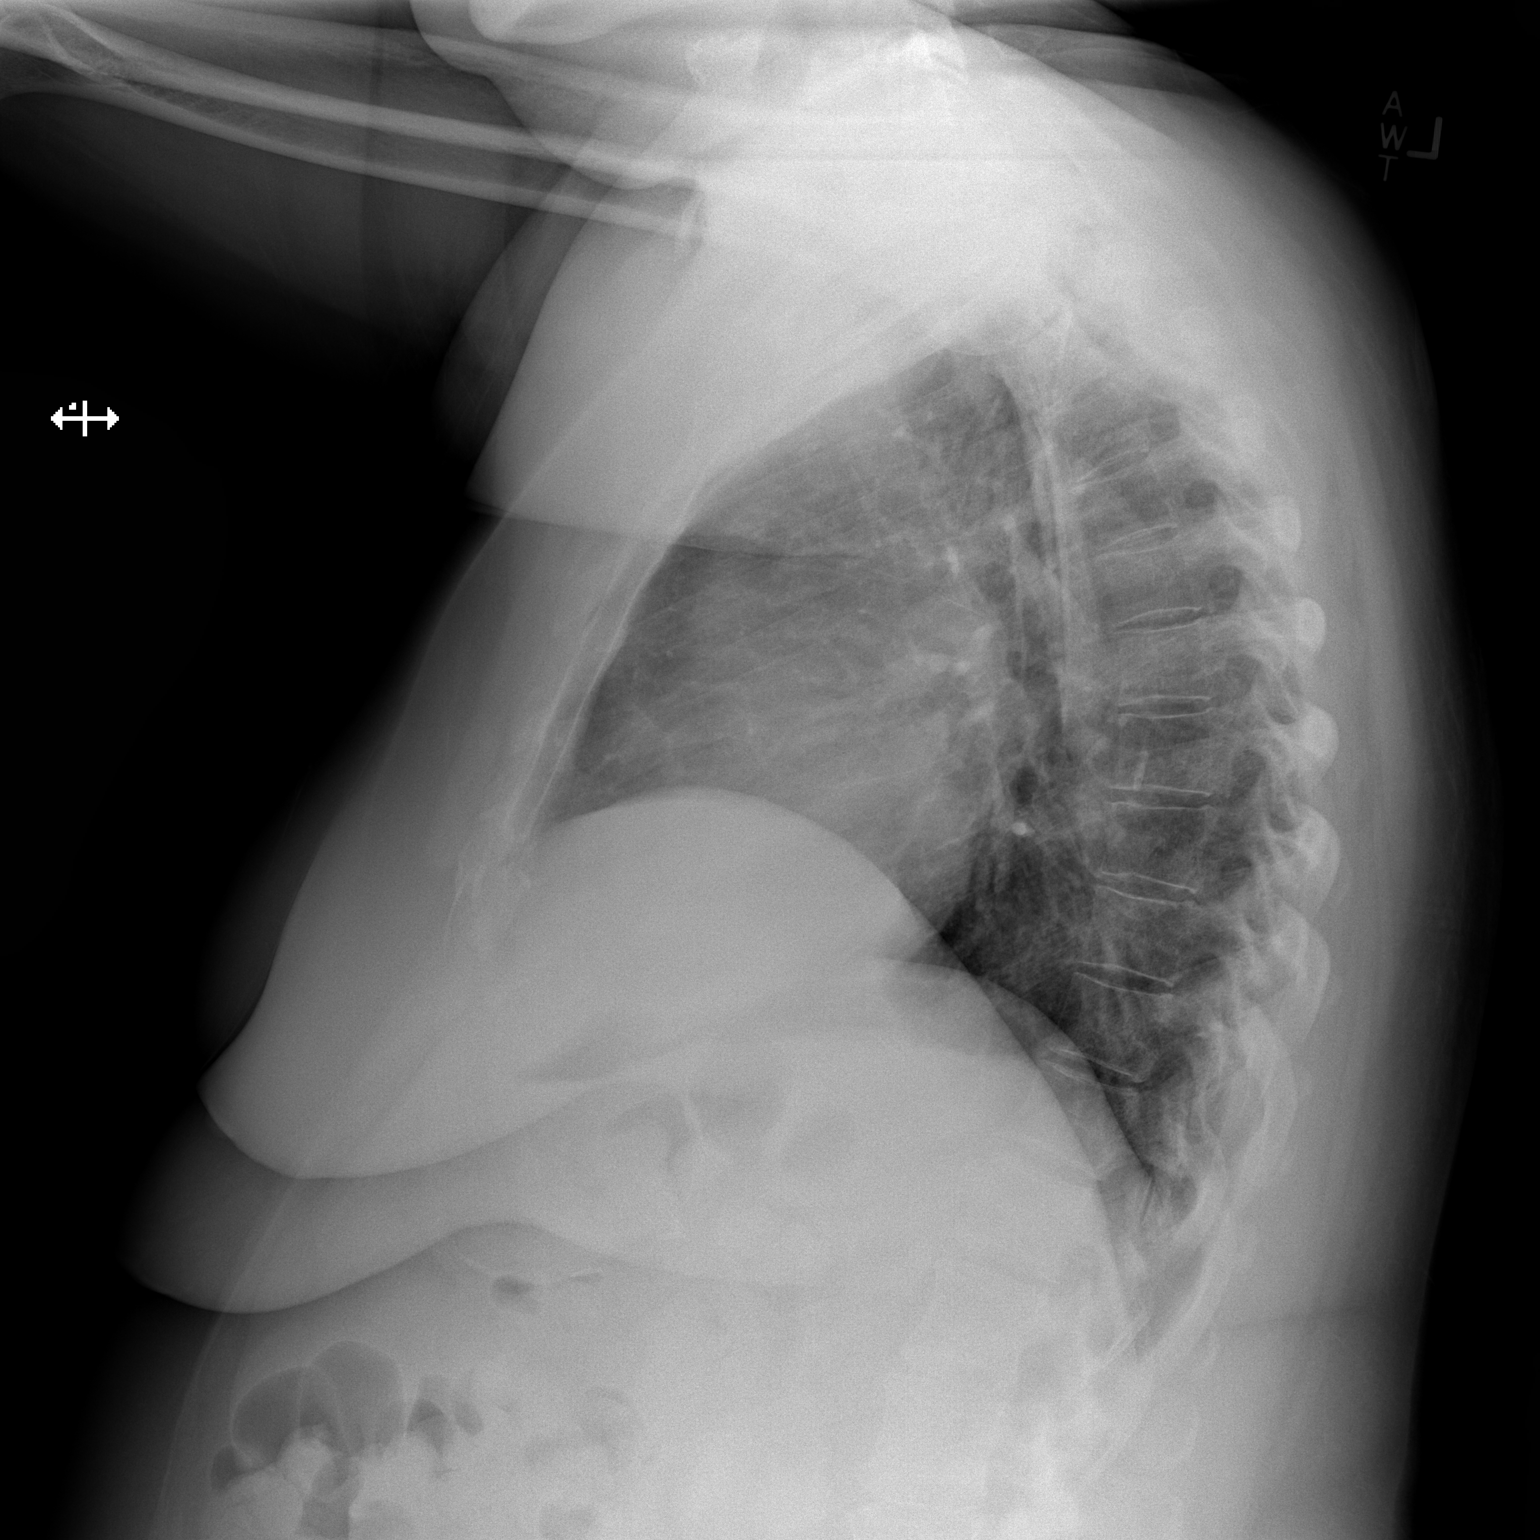

[2 of 2 positions shown; findings below may reference images not displayed]

FINDINGS: Lungs are adequately inflated and otherwise clear. Cardiomediastinal
silhouette and remainder of the exam is unremarkable.
IMPRESSION: No active cardiopulmonary disease.

## 2016-08-22 DIAGNOSIS — Z940 Kidney transplant status: Secondary | ICD-10-CM | POA: Diagnosis not present

## 2016-08-27 DIAGNOSIS — R7989 Other specified abnormal findings of blood chemistry: Secondary | ICD-10-CM | POA: Diagnosis not present

## 2016-08-27 DIAGNOSIS — F450 Somatization disorder: Secondary | ICD-10-CM | POA: Diagnosis not present

## 2016-08-27 DIAGNOSIS — B349 Viral infection, unspecified: Secondary | ICD-10-CM | POA: Diagnosis not present

## 2016-08-27 DIAGNOSIS — I10 Essential (primary) hypertension: Secondary | ICD-10-CM | POA: Diagnosis not present

## 2016-08-27 DIAGNOSIS — B370 Candidal stomatitis: Secondary | ICD-10-CM | POA: Diagnosis not present

## 2016-08-27 DIAGNOSIS — M7989 Other specified soft tissue disorders: Secondary | ICD-10-CM | POA: Diagnosis not present

## 2016-08-27 DIAGNOSIS — I770 Arteriovenous fistula, acquired: Secondary | ICD-10-CM | POA: Diagnosis not present

## 2016-08-27 DIAGNOSIS — F419 Anxiety disorder, unspecified: Secondary | ICD-10-CM | POA: Diagnosis not present

## 2016-08-27 DIAGNOSIS — Z940 Kidney transplant status: Secondary | ICD-10-CM | POA: Diagnosis not present

## 2016-09-03 DIAGNOSIS — I2510 Atherosclerotic heart disease of native coronary artery without angina pectoris: Secondary | ICD-10-CM | POA: Diagnosis not present

## 2016-09-03 DIAGNOSIS — E039 Hypothyroidism, unspecified: Secondary | ICD-10-CM | POA: Diagnosis not present

## 2016-09-03 DIAGNOSIS — Z4822 Encounter for aftercare following kidney transplant: Secondary | ICD-10-CM | POA: Diagnosis not present

## 2016-09-03 DIAGNOSIS — E1169 Type 2 diabetes mellitus with other specified complication: Secondary | ICD-10-CM | POA: Diagnosis not present

## 2016-09-03 DIAGNOSIS — Z794 Long term (current) use of insulin: Secondary | ICD-10-CM | POA: Diagnosis not present

## 2016-09-03 DIAGNOSIS — E785 Hyperlipidemia, unspecified: Secondary | ICD-10-CM | POA: Diagnosis not present

## 2016-09-03 DIAGNOSIS — Z9861 Coronary angioplasty status: Secondary | ICD-10-CM | POA: Diagnosis not present

## 2016-09-03 DIAGNOSIS — I10 Essential (primary) hypertension: Secondary | ICD-10-CM | POA: Diagnosis not present

## 2016-09-03 DIAGNOSIS — Z6834 Body mass index (BMI) 34.0-34.9, adult: Secondary | ICD-10-CM | POA: Diagnosis not present

## 2016-09-03 DIAGNOSIS — E1122 Type 2 diabetes mellitus with diabetic chronic kidney disease: Secondary | ICD-10-CM | POA: Diagnosis not present

## 2016-09-03 DIAGNOSIS — B9789 Other viral agents as the cause of diseases classified elsewhere: Secondary | ICD-10-CM | POA: Diagnosis not present

## 2016-09-03 DIAGNOSIS — E1121 Type 2 diabetes mellitus with diabetic nephropathy: Secondary | ICD-10-CM | POA: Diagnosis not present

## 2016-09-03 DIAGNOSIS — Z7982 Long term (current) use of aspirin: Secondary | ICD-10-CM | POA: Diagnosis not present

## 2016-09-03 DIAGNOSIS — N186 End stage renal disease: Secondary | ICD-10-CM | POA: Diagnosis not present

## 2016-09-03 DIAGNOSIS — Z79899 Other long term (current) drug therapy: Secondary | ICD-10-CM | POA: Diagnosis not present

## 2016-09-03 DIAGNOSIS — E119 Type 2 diabetes mellitus without complications: Secondary | ICD-10-CM | POA: Diagnosis not present

## 2016-09-03 DIAGNOSIS — D8989 Other specified disorders involving the immune mechanism, not elsewhere classified: Secondary | ICD-10-CM | POA: Diagnosis not present

## 2016-09-03 DIAGNOSIS — Z792 Long term (current) use of antibiotics: Secondary | ICD-10-CM | POA: Diagnosis not present

## 2016-09-03 DIAGNOSIS — B349 Viral infection, unspecified: Secondary | ICD-10-CM | POA: Diagnosis not present

## 2016-09-03 DIAGNOSIS — Z940 Kidney transplant status: Secondary | ICD-10-CM | POA: Diagnosis not present

## 2016-09-03 DIAGNOSIS — E669 Obesity, unspecified: Secondary | ICD-10-CM | POA: Diagnosis not present

## 2016-09-03 DIAGNOSIS — I120 Hypertensive chronic kidney disease with stage 5 chronic kidney disease or end stage renal disease: Secondary | ICD-10-CM | POA: Diagnosis not present

## 2016-09-03 DIAGNOSIS — D899 Disorder involving the immune mechanism, unspecified: Secondary | ICD-10-CM | POA: Diagnosis not present

## 2016-10-02 DIAGNOSIS — Z940 Kidney transplant status: Secondary | ICD-10-CM | POA: Diagnosis not present

## 2016-10-02 DIAGNOSIS — I2510 Atherosclerotic heart disease of native coronary artery without angina pectoris: Secondary | ICD-10-CM | POA: Diagnosis not present

## 2016-10-02 DIAGNOSIS — G4700 Insomnia, unspecified: Secondary | ICD-10-CM | POA: Diagnosis not present

## 2016-10-02 DIAGNOSIS — E782 Mixed hyperlipidemia: Secondary | ICD-10-CM | POA: Diagnosis not present

## 2016-10-02 DIAGNOSIS — E039 Hypothyroidism, unspecified: Secondary | ICD-10-CM | POA: Diagnosis not present

## 2016-10-02 DIAGNOSIS — I129 Hypertensive chronic kidney disease with stage 1 through stage 4 chronic kidney disease, or unspecified chronic kidney disease: Secondary | ICD-10-CM | POA: Diagnosis not present

## 2016-10-02 DIAGNOSIS — N183 Chronic kidney disease, stage 3 (moderate): Secondary | ICD-10-CM | POA: Diagnosis not present

## 2016-10-02 DIAGNOSIS — Z794 Long term (current) use of insulin: Secondary | ICD-10-CM | POA: Diagnosis not present

## 2016-10-02 DIAGNOSIS — R609 Edema, unspecified: Secondary | ICD-10-CM | POA: Diagnosis not present

## 2016-10-02 DIAGNOSIS — F3341 Major depressive disorder, recurrent, in partial remission: Secondary | ICD-10-CM | POA: Diagnosis not present

## 2016-10-02 DIAGNOSIS — E1122 Type 2 diabetes mellitus with diabetic chronic kidney disease: Secondary | ICD-10-CM | POA: Diagnosis not present

## 2016-10-02 DIAGNOSIS — Z6833 Body mass index (BMI) 33.0-33.9, adult: Secondary | ICD-10-CM | POA: Diagnosis not present

## 2016-11-11 ENCOUNTER — Ambulatory Visit (INDEPENDENT_AMBULATORY_CARE_PROVIDER_SITE_OTHER): Payer: Medicare Other | Admitting: Endocrinology

## 2016-11-11 ENCOUNTER — Encounter: Payer: Self-pay | Admitting: Endocrinology

## 2016-11-11 VITALS — BP 120/64 | HR 66 | Wt 192.2 lb

## 2016-11-11 DIAGNOSIS — Z794 Long term (current) use of insulin: Secondary | ICD-10-CM

## 2016-11-11 DIAGNOSIS — E1122 Type 2 diabetes mellitus with diabetic chronic kidney disease: Secondary | ICD-10-CM

## 2016-11-11 DIAGNOSIS — N185 Chronic kidney disease, stage 5: Secondary | ICD-10-CM

## 2016-11-11 LAB — POCT GLYCOSYLATED HEMOGLOBIN (HGB A1C): Hemoglobin A1C: 8

## 2016-11-11 MED ORDER — INSULIN NPH (HUMAN) (ISOPHANE) 100 UNIT/ML ~~LOC~~ SUSP: 45.0000 [IU] | SUBCUTANEOUS | 11 refills | Status: DC

## 2016-11-11 NOTE — Patient Instructions (Addendum)
check your blood sugar twice a day.  vary the time of day when you check, between before the 3 meals, and at bedtime.  also check if you have symptoms of your blood sugar being too high or too low.  please keep a record of the readings and bring it to your next appointment here.  You can write it on any piece of paper.  please call us sooner if your blood sugar goes below 70, or if you have a lot of readings over 200.  Please continue the same reg, and increase the NPH to 45 units each morning.  Please come back for a follow-up appointment in 2 months.   On this type of insulin schedule, you should eat meals on a regular schedule.  If a meal is missed or significantly delayed, your blood sugar could go low.

## 2016-11-11 NOTE — Progress Notes (Signed)
Subjective:    Patient ID: Sandy Roy, female    DOB: Sep 12, 1950, 66 y.o.   MRN: [id number]  HPI Pt returns for f/u of diabetes mellitus: DM type: insulin-requiring type 2 Dx'ed: 7829 Complications: CAD and renal failure (transplant 2016).   Therapy: insulin since 2015.   GDM: never.   DKA: never.  Severe hypoglycemia: never.   Pancreatitis: never.   Other: she has requested a QD, inexpensive insulin regimen; she is chronically on low-dose prednisone; the pattern of her cbg's indicates she needs R and N QAM, but no insulin later in the day.  Interval history: She has hypoglycemia less than once per month, and these episodes are mild.  This happens in the afternoon, when she misses lunch.  She seldom misses the insulin.  no cbg record, but states cbg's are also highest in the afternoon.  Most are in the 100's. pt had thyroidectomy for 2 small foci of papillary cancer (2008).  foci were not large enough to warrant RAI.  Past Medical History:  Diagnosis Date  . Chronic kidney disease    ESRD-  Hemo- M_W_F  . CONGESTIVE HEART FAILURE 12/14/2006  . CORONARY ARTERY DISEASE 12/14/2006  . Depression    not recently  . DIABETES MELLITUS, TYPE II 12/14/2006   Type 2  . HYPERLIPIDEMIA 12/14/2006  . HYPERTENSION 12/14/2006  . Hypoparathyroidism ([place]) 12/21/2006  . Hypopotassemia 01/13/2007  . HYPOTHYROIDISM, POSTSURGICAL 12/15/2006  . NEOPLASM, MALIGNANT, THYROID GLAND 12/15/2006   Stage 1 papillary adenocarcinoma, one very small focus each lobe (61mm, 68mm)  6/09: CT neck: no evidence of recurrence  . OBSTRUCTIVE SLEEP APNEA 02/23/2007   does not use a cpap  . Orbital fracture ([facility]CC)    left eye  . Shortness of breath dyspnea    with exertion    Past Surgical History:  Procedure Laterality Date  . BASCILIC VEIN TRANSPOSITION Right 02/07/2014   Procedure: FIRST STAGE BASILIC VEIN TRANSPOSITION;  Surgeon: Conrad [place], MD;  Location: [place];  Service: Vascular;  Laterality: Right;  .  BASCILIC VEIN TRANSPOSITION Right 05/30/2014   Procedure: SECOND STAGE BASILIC VEIN TRANSPOSITION;  Surgeon: Conrad [place], MD;  Location: [place];  Service: Vascular;  Laterality: Right;  . BREAST SURGERY Left    nipple leaking, had surgery to fix the leak  . CARDIAC CATHETERIZATION     ? 10 years ago  . CHOLECYSTECTOMY    . COLONOSCOPY    . ORBITAL FRACTURE SURGERY    . OVARY SURGERY    . THYROIDECTOMY  2008  . TONSILLECTOMY    . TUBAL LIGATION      Social History   Social History  . Marital status: Widowed    Spouse name: N/A  . Number of children: N/A  . Years of education: N/A   Occupational History  . Not on file.   Social History Main Topics  . Smoking status: Former Smoker    Types: Cigarettes  . Smokeless tobacco: Never Used     Comment: "quit years ago"  . Alcohol use No  . Drug use: No  . Sexual activity: Not on file   Other Topics Concern  . Not on file   Social History Narrative  . No narrative on file    Current Outpatient Prescriptions on File Prior to Visit  Medication Sig Dispense Refill  . amLODipine (NORVASC) 10 MG tablet Take 10 mg by mouth daily.     Marland Kitchen aspirin (BAYER LOW STRENGTH) 81 MG EC  tablet Take 81 mg by mouth daily.      Marland Kitchen atorvastatin (LIPITOR) 10 MG tablet Take 10 mg by mouth daily.    Marland Kitchen CALCIUM PO Take by mouth.    . cloNIDine (CATAPRES) 0.2 MG tablet Take 0.1 mg by mouth 2 (two) times daily.     . fish oil-omega-3 fatty acids 1000 MG capsule Take 1 capsule by mouth daily.      Marland Kitchen glucose blood (ONETOUCH VERIO) test strip USE ONE STRIP TO CHECK GLUCOSE TWICE DAILY Dx code: E11.9 200 each 3  . insulin regular (HUMULIN R) 250 units/2.78mL (100 units/mL) injection Inject 0.35 mLs (35 Units total) into the skin daily with breakfast. 10 mL 11  . Insulin Syringe-Needle U-100 (B-D INSULIN SYRINGE) 31G X 5/16" 0.3 ML MISC Use 1 time per day. 100 each 1  . labetalol (NORMODYNE) 100 MG tablet 2 in the morning 2 in the evening  2  . Lancets  (ONETOUCH ULTRASOFT) lancets Use to check blood sugar 2 times per day 100 each 2  . levothyroxine (SYNTHROID, LEVOTHROID) 125 MCG tablet Take 1 tablet (125 mcg total) by mouth daily before breakfast. 90 tablet 3  . Multiple Vitamins-Minerals (MULTIVITAMIN WITH MINERALS) tablet Take 1 tablet by mouth daily.    . mycophenolate (MYFORTIC) 180 MG EC tablet Take 180 mg by mouth 2 (two) times daily.    . nitroGLYCERIN (NITROSTAT) 0.4 MG SL tablet Place 1 tablet (0.4 mg total) under the tongue every 5 (five) minutes as needed. 25 tablet 5  . omeprazole (PRILOSEC) 20 MG capsule Take 20 mg by mouth daily.    . predniSONE (DELTASONE) 5 MG tablet Take 5 mg by mouth daily with breakfast.    . sertraline (ZOLOFT) 50 MG tablet Take 50 mg by mouth daily.    Marland Kitchen sulfamethoxazole-trimethoprim (BACTRIM,SEPTRA) 400-80 MG per tablet 1 tablet. On Monday Wednesday and Friday    . tacrolimus (PROGRAF) 1 MG capsule Take 5 mg by mouth. 6 tabs in the morning 6 tabs in the evening    . triazolam (HALCION) 0.25 MG tablet 2 tablets by mouth at bedtime     . zinc gluconate 50 MG tablet Take 50 mg by mouth daily.     No current facility-administered medications on file prior to visit.     No Known Allergies  Family History  Problem Relation Age of Onset  . Cancer Other   . COPD Other   . Hypertension Other   . Hyperlipidemia Other   . Stroke Other   . Heart disease Other   . Diabetes Other   . Heart failure Mother   . Diabetes Mother   . Cancer Father     BP 120/64   Pulse 66   Wt 192 lb 3.2 oz (87.2 kg)   SpO2 95%   BMI 34.05 kg/m    Review of Systems Denies LOC    Objective:   Physical Exam VITAL SIGNS:  See vs page GENERAL: no distress Pulses: dorsalis pedis intact bilat.   MSK: no deformity of the feet CV: no leg edema Skin:  no ulcer on the feet.  normal color and temp on the feet. Neuro: sensation is intact to touch on the feet.   Ext: There is bilateral onychomycosis of the  toenails  A1c=8.0%     Assessment & Plan:  Insulin-requiring type 2 DM, with renal failure, worse.    CAD: in this setting, she should avoid hypoglycemia.   Patient Instructions  check your  blood sugar twice a day.  vary the time of day when you check, between before the 3 meals, and at bedtime.  also check if you have symptoms of your blood sugar being too high or too low.  please keep a record of the readings and bring it to your next appointment here.  You can write it on any piece of paper.  please call us sooner if your blood sugar goes below 70, or if you have a lot of readings over 200.  Please continue the same reg, and increase the NPH to 45 units each morning.  Please come back for a follow-up appointment in 2 months.   On this type of insulin schedule, you should eat meals on a regular schedule.  If a meal is missed or significantly delayed, your blood sugar could go low.

## 2016-11-18 DIAGNOSIS — Z23 Encounter for immunization: Secondary | ICD-10-CM | POA: Diagnosis not present

## 2016-11-28 DIAGNOSIS — Z940 Kidney transplant status: Secondary | ICD-10-CM | POA: Diagnosis not present

## 2016-11-28 DIAGNOSIS — E782 Mixed hyperlipidemia: Secondary | ICD-10-CM | POA: Diagnosis not present

## 2016-12-05 DIAGNOSIS — R062 Wheezing: Secondary | ICD-10-CM | POA: Diagnosis not present

## 2016-12-30 DIAGNOSIS — M7989 Other specified soft tissue disorders: Secondary | ICD-10-CM | POA: Diagnosis not present

## 2016-12-30 DIAGNOSIS — Z940 Kidney transplant status: Secondary | ICD-10-CM | POA: Diagnosis not present

## 2016-12-30 DIAGNOSIS — F450 Somatization disorder: Secondary | ICD-10-CM | POA: Diagnosis not present

## 2016-12-30 DIAGNOSIS — B349 Viral infection, unspecified: Secondary | ICD-10-CM | POA: Diagnosis not present

## 2016-12-30 DIAGNOSIS — I770 Arteriovenous fistula, acquired: Secondary | ICD-10-CM | POA: Diagnosis not present

## 2016-12-30 DIAGNOSIS — B370 Candidal stomatitis: Secondary | ICD-10-CM | POA: Diagnosis not present

## 2016-12-30 DIAGNOSIS — I10 Essential (primary) hypertension: Secondary | ICD-10-CM | POA: Diagnosis not present

## 2016-12-30 DIAGNOSIS — F419 Anxiety disorder, unspecified: Secondary | ICD-10-CM | POA: Diagnosis not present

## 2017-01-08 DIAGNOSIS — B349 Viral infection, unspecified: Secondary | ICD-10-CM | POA: Diagnosis not present

## 2017-01-08 DIAGNOSIS — Z940 Kidney transplant status: Secondary | ICD-10-CM | POA: Diagnosis not present

## 2017-01-13 ENCOUNTER — Ambulatory Visit (INDEPENDENT_AMBULATORY_CARE_PROVIDER_SITE_OTHER): Payer: Medicare Other | Admitting: Endocrinology

## 2017-01-13 ENCOUNTER — Encounter: Payer: Self-pay | Admitting: Endocrinology

## 2017-01-13 VITALS — BP 144/68 | HR 64 | Wt 194.8 lb

## 2017-01-13 DIAGNOSIS — N185 Chronic kidney disease, stage 5: Secondary | ICD-10-CM | POA: Diagnosis not present

## 2017-01-13 DIAGNOSIS — E209 Hypoparathyroidism, unspecified: Secondary | ICD-10-CM

## 2017-01-13 DIAGNOSIS — E1122 Type 2 diabetes mellitus with diabetic chronic kidney disease: Secondary | ICD-10-CM | POA: Diagnosis not present

## 2017-01-13 DIAGNOSIS — Z794 Long term (current) use of insulin: Secondary | ICD-10-CM | POA: Diagnosis not present

## 2017-01-13 DIAGNOSIS — E890 Postprocedural hypothyroidism: Secondary | ICD-10-CM | POA: Diagnosis not present

## 2017-01-13 LAB — TSH: TSH: 0.18 u[IU]/mL — ABNORMAL LOW (ref 0.35–4.50)

## 2017-01-13 LAB — T4, FREE: FREE T4: 1.09 ng/dL (ref 0.60–1.60)

## 2017-01-13 LAB — POCT GLYCOSYLATED HEMOGLOBIN (HGB A1C): HEMOGLOBIN A1C: 7.6

## 2017-01-13 LAB — VITAMIN D 25 HYDROXY (VIT D DEFICIENCY, FRACTURES): VITD: 35.79 ng/mL (ref 30.00–100.00)

## 2017-01-13 MED ORDER — LEVOTHYROXINE SODIUM 100 MCG PO TABS: 100.0000 ug | ORAL_TABLET | Freq: Every day | ORAL | 3 refills | Status: DC

## 2017-01-13 NOTE — Patient Instructions (Addendum)
check your blood sugar twice a day.  vary the time of day when you check, between before the 3 meals, and at bedtime.  also check if you have symptoms of your blood sugar being too high or too low.  please keep a record of the readings and bring it to your next appointment here.  You can write it on any piece of paper.  please call us sooner if your blood sugar goes below 70, or if you have a lot of readings over 200.  Please continue the same insulins On this type of insulin schedule, you should eat meals on a regular schedule.  If a meal is missed or significantly delayed, your blood sugar could go low.   blood tests are requested for you today.  We'll let you know about the results. Please come back for a follow-up appointment in 4 months.

## 2017-01-13 NOTE — Progress Notes (Signed)
Subjective:    Patient ID: Sandy Roy, female    DOB: [birth year], 66 y.o.   MRN: [id number]  HPI Pt returns for f/u of diabetes mellitus: DM type: insulin-requiring type 2 Dx'ed: 4580 Complications: CAD and renal failure (transplant 2016).   Therapy: insulin since 2015.   GDM: never.   DKA: never.  Severe hypoglycemia: never.   Pancreatitis: never.   Other: she has requested a QD, inexpensive insulin regimen; she is chronically on low-dose prednisone; the pattern of her cbg's indicates she needs R and N QAM, but no insulin later in the day.  Interval history: She seldom has hypoglycemia, and these episodes are mild.  This happens in the afternoon, when she misses lunch.  She never misses the insulin.  She does not check cbg's pt had thyroidectomy for 2 small foci of papillary cancer (2008).  foci were not large enough to warrant RAI.   Past Medical History:  Diagnosis Date  . Chronic kidney disease    ESRD-  Hemo- M_W_F  . CONGESTIVE HEART FAILURE 12/14/2006  . CORONARY ARTERY DISEASE 12/14/2006  . Depression    not recently  . DIABETES MELLITUS, TYPE II 12/14/2006   Type 2  . HYPERLIPIDEMIA 12/14/2006  . HYPERTENSION 12/14/2006  . Hypoparathyroidism ([place]) 12/21/2006  . Hypopotassemia 01/13/2007  . HYPOTHYROIDISM, POSTSURGICAL 12/15/2006  . NEOPLASM, MALIGNANT, THYROID GLAND 12/15/2006   Stage 1 papillary adenocarcinoma, one very small focus each lobe (16mm, 39mm)  6/09: CT neck: no evidence of recurrence  . OBSTRUCTIVE SLEEP APNEA 02/23/2007   does not use a cpap  . Orbital fracture ([facility]CC)    left eye  . Shortness of breath dyspnea    with exertion    Past Surgical History:  Procedure Laterality Date  . BREAST SURGERY Left    nipple leaking, had surgery to fix the leak  . CARDIAC CATHETERIZATION     ? 10 years ago  . CHOLECYSTECTOMY    . COLONOSCOPY    . ORBITAL FRACTURE SURGERY    . OVARY SURGERY    . THYROIDECTOMY  2008  . TONSILLECTOMY    . TUBAL LIGATION       Social History   Socioeconomic History  . Marital status: Widowed    Spouse name: Not on file  . Number of children: Not on file  . Years of education: Not on file  . Highest education level: Not on file  Social Needs  . Financial resource strain: Not on file  . Food insecurity - worry: Not on file  . Food insecurity - inability: Not on file  . Transportation needs - medical: Not on file  . Transportation needs - non-medical: Not on file  Occupational History  . Not on file  Tobacco Use  . Smoking status: Former Smoker    Types: Cigarettes  . Smokeless tobacco: Never Used  . Tobacco comment: "quit years ago"  Substance and Sexual Activity  . Alcohol use: No    Alcohol/week: 0.0 oz  . Drug use: No  . Sexual activity: Not on file  Other Topics Concern  . Not on file  Social History Narrative  . Not on file    Current Outpatient Medications on File Prior to Visit  Medication Sig Dispense Refill  . amLODipine (NORVASC) 10 MG tablet Take 10 mg by mouth daily.     Marland Kitchen aspirin (BAYER LOW STRENGTH) 81 MG EC tablet Take 81 mg by mouth daily.      Marland Kitchen atorvastatin (  LIPITOR) 10 MG tablet Take 10 mg by mouth daily.    Marland Kitchen CALCIUM PO Take by mouth.    . cloNIDine (CATAPRES) 0.2 MG tablet Take 0.1 mg by mouth 2 (two) times daily.     . fish oil-omega-3 fatty acids 1000 MG capsule Take 1 capsule by mouth daily.      Marland Kitchen glucose blood (ONETOUCH VERIO) test strip USE ONE STRIP TO CHECK GLUCOSE TWICE DAILY Dx code: E11.9 200 each 3  . insulin NPH Human (HUMULIN N) 100 UNIT/ML injection Inject 0.45 mLs (45 Units total) into the skin every morning. 20 mL 11  . insulin regular (HUMULIN R) 250 units/2.76mL (100 units/mL) injection Inject 0.35 mLs (35 Units total) into the skin daily with breakfast. 10 mL 11  . Insulin Syringe-Needle U-100 (B-D INSULIN SYRINGE) 31G X 5/16" 0.3 ML MISC Use 1 time per day. 100 each 1  . labetalol (NORMODYNE) 100 MG tablet 2 in the morning 2 in the evening  2  .  Lancets (ONETOUCH ULTRASOFT) lancets Use to check blood sugar 2 times per day 100 each 2  . Multiple Vitamins-Minerals (MULTIVITAMIN WITH MINERALS) tablet Take 1 tablet by mouth daily.    . mycophenolate (MYFORTIC) 180 MG EC tablet Take 180 mg by mouth 2 (two) times daily.    . nitroGLYCERIN (NITROSTAT) 0.4 MG SL tablet Place 1 tablet (0.4 mg total) under the tongue every 5 (five) minutes as needed. 25 tablet 5  . omeprazole (PRILOSEC) 20 MG capsule Take 20 mg by mouth daily.    . predniSONE (DELTASONE) 5 MG tablet Take 5 mg by mouth daily with breakfast.    . sertraline (ZOLOFT) 50 MG tablet Take 50 mg by mouth daily.    Marland Kitchen sulfamethoxazole-trimethoprim (BACTRIM,SEPTRA) 400-80 MG per tablet 1 tablet. On Monday Wednesday and Friday    . tacrolimus (PROGRAF) 1 MG capsule Take 5 mg by mouth. 6 tabs in the morning 6 tabs in the evening    . triazolam (HALCION) 0.25 MG tablet 2 tablets by mouth at bedtime     . zinc gluconate 50 MG tablet Take 50 mg by mouth daily.     No current facility-administered medications on file prior to visit.     No Known Allergies  Family History  Problem Relation Age of Onset  . Cancer Other   . COPD Other   . Hypertension Other   . Hyperlipidemia Other   . Stroke Other   . Heart disease Other   . Diabetes Other   . Heart failure Mother   . Diabetes Mother   . Cancer Father     BP (!) 144/68 (BP Location: Left Arm, Patient Position: Sitting, Cuff Size: Normal)   Pulse 64   Wt 194 lb 12.8 oz (88.4 kg)   SpO2 96%   BMI 34.51 kg/m    Review of Systems Denies LOC    Objective:   Physical Exam VITAL SIGNS:  See vs page GENERAL: no distress Pulses: dorsalis pedis intact bilat.   MSK: no deformity of the feet CV: no leg edema Skin:  no ulcer on the feet.  normal color and temp on the feet. Neuro: sensation is intact to touch on the feet.   Ext: There is bilateral onychomycosis of the toenails.    Lab Results  Component Value Date   HGBA1C 7.6  01/13/2017       Assessment & Plan:  Insulin-requiring type 2 DM: this is the best control this pt should aim  for, given this regimen, which does match insulin to her changing needs throughout the day postsurgical hypoparathyroidism: due for reheck Postsurgical hypothyroidism: due for recheck  Patient Instructions  check your blood sugar twice a day.  vary the time of day when you check, between before the 3 meals, and at bedtime.  also check if you have symptoms of your blood sugar being too high or too low.  please keep a record of the readings and bring it to your next appointment here.  You can write it on any piece of paper.  please call us sooner if your blood sugar goes below 70, or if you have a lot of readings over 200.  Please continue the same insulins On this type of insulin schedule, you should eat meals on a regular schedule.  If a meal is missed or significantly delayed, your blood sugar could go low.   blood tests are requested for you today.  We'll let you know about the results. Please come back for a follow-up appointment in 4 months.

## 2017-01-14 ENCOUNTER — Other Ambulatory Visit: Payer: Self-pay

## 2017-01-15 LAB — PTH, INTACT AND CALCIUM
Calcium: 8.7 mg/dL (ref 8.6–10.4)
PTH: 19 pg/mL (ref 14–64)

## 2017-02-26 DIAGNOSIS — Z940 Kidney transplant status: Secondary | ICD-10-CM | POA: Diagnosis not present

## 2017-03-25 ENCOUNTER — Other Ambulatory Visit: Payer: Self-pay | Admitting: Endocrinology

## 2017-03-26 ENCOUNTER — Other Ambulatory Visit: Payer: Self-pay

## 2017-03-26 ENCOUNTER — Telehealth: Payer: Self-pay

## 2017-03-26 MED ORDER — INSULIN NPH (HUMAN) (ISOPHANE) 100 UNIT/ML ~~LOC~~ SUSP: 45.0000 [IU] | SUBCUTANEOUS | 11 refills | Status: DC

## 2017-03-26 NOTE — Telephone Encounter (Signed)
I also tried to call patient  & she didn't answer. I told her that pharmacy was concerned & I don't know why she hasn't picked up her Humulin N. I resent prescription to [facility] even though she had more refills on it. I know patient was having some issues at home when she came to last visit so I have asked her to call us back.

## 2017-03-26 NOTE — Telephone Encounter (Signed)
patient ran out of Humulin N and has been taking Humulin R and increasing it to 45 units daily instead of 35 units to compensate for not having the Humulin N the  pharmacist has tried to reach out to patient and has been unable to reach her to let her know this is not an appropriate solution- pharmacist just wanted to make her doctor aware of this

## 2017-03-27 ENCOUNTER — Other Ambulatory Visit: Payer: Self-pay

## 2017-04-06 DIAGNOSIS — G4700 Insomnia, unspecified: Secondary | ICD-10-CM | POA: Diagnosis not present

## 2017-04-06 DIAGNOSIS — R609 Edema, unspecified: Secondary | ICD-10-CM | POA: Diagnosis not present

## 2017-04-06 DIAGNOSIS — E1122 Type 2 diabetes mellitus with diabetic chronic kidney disease: Secondary | ICD-10-CM | POA: Diagnosis not present

## 2017-04-06 DIAGNOSIS — I129 Hypertensive chronic kidney disease with stage 1 through stage 4 chronic kidney disease, or unspecified chronic kidney disease: Secondary | ICD-10-CM | POA: Diagnosis not present

## 2017-04-06 DIAGNOSIS — F3341 Major depressive disorder, recurrent, in partial remission: Secondary | ICD-10-CM | POA: Diagnosis not present

## 2017-04-06 DIAGNOSIS — Z1389 Encounter for screening for other disorder: Secondary | ICD-10-CM | POA: Diagnosis not present

## 2017-04-06 DIAGNOSIS — N183 Chronic kidney disease, stage 3 (moderate): Secondary | ICD-10-CM | POA: Diagnosis not present

## 2017-04-06 DIAGNOSIS — I2510 Atherosclerotic heart disease of native coronary artery without angina pectoris: Secondary | ICD-10-CM | POA: Diagnosis not present

## 2017-04-06 DIAGNOSIS — E782 Mixed hyperlipidemia: Secondary | ICD-10-CM | POA: Diagnosis not present

## 2017-04-06 DIAGNOSIS — Z940 Kidney transplant status: Secondary | ICD-10-CM | POA: Diagnosis not present

## 2017-04-06 DIAGNOSIS — Z0000 Encounter for general adult medical examination without abnormal findings: Secondary | ICD-10-CM | POA: Diagnosis not present

## 2017-04-06 DIAGNOSIS — E039 Hypothyroidism, unspecified: Secondary | ICD-10-CM | POA: Diagnosis not present

## 2017-04-28 ENCOUNTER — Other Ambulatory Visit: Payer: Self-pay

## 2017-04-28 MED ORDER — LEVOTHYROXINE SODIUM 100 MCG PO TABS: 100.0000 ug | ORAL_TABLET | Freq: Every day | ORAL | 3 refills | Status: DC

## 2017-04-30 DIAGNOSIS — M7989 Other specified soft tissue disorders: Secondary | ICD-10-CM | POA: Diagnosis not present

## 2017-04-30 DIAGNOSIS — Z940 Kidney transplant status: Secondary | ICD-10-CM | POA: Diagnosis not present

## 2017-04-30 DIAGNOSIS — F450 Somatization disorder: Secondary | ICD-10-CM | POA: Diagnosis not present

## 2017-04-30 DIAGNOSIS — B349 Viral infection, unspecified: Secondary | ICD-10-CM | POA: Diagnosis not present

## 2017-04-30 DIAGNOSIS — F419 Anxiety disorder, unspecified: Secondary | ICD-10-CM | POA: Diagnosis not present

## 2017-04-30 DIAGNOSIS — B370 Candidal stomatitis: Secondary | ICD-10-CM | POA: Diagnosis not present

## 2017-04-30 DIAGNOSIS — I10 Essential (primary) hypertension: Secondary | ICD-10-CM | POA: Diagnosis not present

## 2017-04-30 DIAGNOSIS — I770 Arteriovenous fistula, acquired: Secondary | ICD-10-CM | POA: Diagnosis not present

## 2017-05-01 DIAGNOSIS — B349 Viral infection, unspecified: Secondary | ICD-10-CM | POA: Diagnosis not present

## 2017-05-01 DIAGNOSIS — Z940 Kidney transplant status: Secondary | ICD-10-CM | POA: Diagnosis not present

## 2017-05-06 ENCOUNTER — Emergency Department (INDEPENDENT_AMBULATORY_CARE_PROVIDER_SITE_OTHER)
Admission: EM | Admit: 2017-05-06 | Discharge: 2017-05-06 | Disposition: A | Discharge status: Home / Self Care | Payer: Medicare Other | Source: Home / Self Care

## 2017-05-06 ENCOUNTER — Other Ambulatory Visit: Payer: Self-pay

## 2017-05-06 ENCOUNTER — Encounter: Payer: Self-pay | Admitting: *Deleted

## 2017-05-06 DIAGNOSIS — E160 Drug-induced hypoglycemia without coma: Secondary | ICD-10-CM | POA: Diagnosis not present

## 2017-05-06 DIAGNOSIS — T383X5A Adverse effect of insulin and oral hypoglycemic [antidiabetic] drugs, initial encounter: Secondary | ICD-10-CM | POA: Diagnosis not present

## 2017-05-06 DIAGNOSIS — R42 Dizziness and giddiness: Secondary | ICD-10-CM | POA: Diagnosis not present

## 2017-05-06 LAB — POCT FASTING CBG KUC MANUAL ENTRY: POCT Glucose (KUC): 69 mg/dL — AB (ref 70–99)

## 2017-05-06 NOTE — ED Triage Notes (Signed)
Pt was in our clinic waiting with a family member to be seen when she became lightheaded and shaky. Took her morning meds.

## 2017-05-06 NOTE — ED Provider Notes (Signed)
[facility]    CSN: [id number] Arrival date & time: 05/06/17  1033     History   Chief Complaint Chief Complaint  Patient presents with  . Shaking  . Dizziness    HPI Sandy Roy is a 67 y.o. female.   HPI Sandy Roy was bringing in her 72-year-old foster child for an acute illness, when Sandy Roy became lightheaded and shaky in the waiting area, and our staff rushed Sandy Roy back to an exam room for evaluation and possible treatment. She has type 2 diabetes and took her usual dose of insulin this morning, but she admits she may not have eaten a full breakfast, while rushing her family member to be seen here in urgent care.  She became lightheaded and shaky in our waiting area, but no other symptoms.  No chest pain or shortness of breath or focal neurologic symptoms or vision problems. When brought back to exam room, stat fingerstick blood glucose was 69. Upon eating food and drinking some water, her shakiness and lightheadedness completely resolved and follow-up fingerstick glucose was 120. Past Medical History:  Diagnosis Date  . Chronic kidney disease    ESRD-  Hemo- M_W_F  . CONGESTIVE HEART FAILURE 12/14/2006  . CORONARY ARTERY DISEASE 12/14/2006  . Depression    not recently  . DIABETES MELLITUS, TYPE II 12/14/2006   Type 2  . HYPERLIPIDEMIA 12/14/2006  . HYPERTENSION 12/14/2006  . Hypoparathyroidism ([place]) 12/21/2006  . Hypopotassemia 01/13/2007  . HYPOTHYROIDISM, POSTSURGICAL 12/15/2006  . NEOPLASM, MALIGNANT, THYROID GLAND 12/15/2006   Stage 1 papillary adenocarcinoma, one very small focus each lobe (73mm, 59mm)  6/09: CT neck: no evidence of recurrence  . OBSTRUCTIVE SLEEP APNEA 02/23/2007   does not use a cpap  . Orbital fracture ([facility])    left eye  . Shortness of breath dyspnea    with exertion    Patient Active Problem List   Diagnosis Date Noted  . Diabetes ([place]) 07/16/2015  . Hypercalcemia 01/19/2014  . ESRD (end stage renal  disease) on dialysis ([place]) 12/08/2013  . OBSTRUCTIVE SLEEP APNEA 02/23/2007  . HYPOPOTASSEMIA 01/13/2007  . Hypoparathyroidism ([place]) 12/21/2006  . NEOPLASM, MALIGNANT, THYROID GLAND 12/15/2006  . HYPOTHYROIDISM, POSTSURGICAL 12/15/2006  . Dyslipidemia 12/14/2006  . Essential hypertension 12/14/2006  . CORONARY ARTERY DISEASE 12/14/2006  . CONGESTIVE HEART FAILURE 12/14/2006    Past Surgical History:  Procedure Laterality Date  . BASCILIC VEIN TRANSPOSITION Right 02/07/2014   Procedure: FIRST STAGE BASILIC VEIN TRANSPOSITION;  Surgeon: Conrad [place], MD;  Location: [facility];  Service: Vascular;  Laterality: Right;  . BASCILIC VEIN TRANSPOSITION Right 05/30/2014   Procedure: SECOND STAGE BASILIC VEIN TRANSPOSITION;  Surgeon: Conrad Mound, MD;  Location: [facility];  Service: Vascular;  Laterality: Right;  . BREAST SURGERY Left    nipple leaking, had surgery to fix the leak  . CARDIAC CATHETERIZATION     ? 10 years ago  . CHOLECYSTECTOMY    . COLONOSCOPY    . ORBITAL FRACTURE SURGERY    . OVARY SURGERY    . THYROIDECTOMY  2008  . TONSILLECTOMY    . TUBAL LIGATION      OB History    No data available       Home Medications    Prior to Admission medications   Medication Sig Start Date End Date Taking? Authorizing Provider  amLODipine (NORVASC) 10 MG tablet Take 10 mg by mouth daily.     [provider]  aspirin (  BAYER LOW STRENGTH) 81 MG EC tablet Take 81 mg by mouth daily.      [provider]  atorvastatin (LIPITOR) 10 MG tablet Take 10 mg by mouth daily.    [provider]  CALCIUM PO Take by mouth.    [provider]  cloNIDine (CATAPRES) 0.2 MG tablet Take 0.1 mg by mouth 2 (two) times daily.     [provider]  fish oil-omega-3 fatty acids 1000 MG capsule Take 1 capsule by mouth daily.      [provider]  glucose blood (ONETOUCH VERIO) test strip USE ONE STRIP TO CHECK GLUCOSE TWICE DAILY Dx code: E11.9 03/20/16   Renato Shin, MD  HUMULIN R 100 UNIT/ML injection INJECT 35 UNITS INTO THE SKIN DAILY WITH BREAKFAST 03/25/17   Renato Shin, MD  insulin NPH Human (HUMULIN N) 100 UNIT/ML injection Inject 0.45 mLs (45 Units total) into the skin every morning. 03/26/17   Renato Shin, MD  Insulin Syringe-Needle U-100 (B-D INSULIN SYRINGE) 31G X 5/16" 0.3 ML MISC Use 1 time per day. 01/17/16   Renato Shin, MD  labetalol (NORMODYNE) 100 MG tablet 2 in the morning 2 in the evening 11/28/14   [provider]  Lancets [facility] ULTRASOFT) lancets Use to check blood sugar 2 times per day 02/12/15   Renato Shin, MD  levothyroxine (SYNTHROID, LEVOTHROID) 100 MCG tablet Take 1 tablet (100 mcg total) by mouth daily before breakfast. 04/28/17   Renato Shin, MD  Multiple Vitamins-Minerals (MULTIVITAMIN WITH MINERALS) tablet Take 1 tablet by mouth daily.    [provider]  mycophenolate (MYFORTIC) 180 MG EC tablet Take 180 mg by mouth 2 (two) times daily.    [provider]  nitroGLYCERIN (NITROSTAT) 0.4 MG SL tablet Place 1 tablet (0.4 mg total) under the tongue every 5 (five) minutes as needed. 12/08/13   Jettie Booze, MD  omeprazole (PRILOSEC) 20 MG capsule Take 20 mg by mouth daily.    [provider]  predniSONE (DELTASONE) 5 MG tablet Take 5 mg by mouth daily with breakfast.    [provider]  sertraline (ZOLOFT) 50 MG tablet Take 50 mg by mouth daily.    [provider]  sulfamethoxazole-trimethoprim (BACTRIM,SEPTRA) 400-80 MG per tablet 1 tablet. On Monday Wednesday and Friday    [provider]  tacrolimus (PROGRAF) 1 MG capsule Take 5 mg by mouth. 6 tabs in the morning 6 tabs in the evening    [provider]  triazolam (HALCION) 0.25 MG tablet 2 tablets by mouth at bedtime     [provider]  zinc gluconate 50 MG tablet Take 50 mg by mouth daily.    [provider]    Family History Family History  Problem Relation Age of  Onset  . Heart failure Mother   . Diabetes Mother   . Cancer Father   . Cancer Other   . COPD Other   . Hypertension Other   . Hyperlipidemia Other   . Stroke Other   . Heart disease Other   . Diabetes Other     Social History Social History   Tobacco Use  . Smoking status: Former Smoker    Types: Cigarettes  . Smokeless tobacco: Never Used  . Tobacco comment: "quit years ago"  Substance Use Topics  . Alcohol use: No    Alcohol/week: 0.0 oz  . Drug use: No     Allergies   Patient has no known allergies.  Review of Systems Review of Systems  Constitutional: Negative for fever.  HENT: Negative.   Respiratory: Negative.   Cardiovascular: Negative.   Gastrointestinal: Negative for abdominal pain, nausea and vomiting.  Skin: Negative for rash.  Psychiatric/Behavioral: Negative for hallucinations.  All other systems reviewed and are negative. See also HPI   Physical Exam Triage Vital Signs ED Triage Vitals  Enc Vitals Group     BP 05/06/17 1034 124/64     Pulse Rate 05/06/17 1034 (!) 59     Resp 05/06/17 1034 18     Temp 05/06/17 1034 (!) 97.4 F (36.3 C)     Temp Source 05/06/17 1034 Oral     SpO2 05/06/17 1034 95 %     Weight --      Height --      Head Circumference --      Peak Flow --      Pain Score 05/06/17 1035 0     Pain Loc --      Pain Edu? --      Excl. in [place]? --    No data found.  Updated Vital Signs BP 124/64 (BP Location: Right Arm)   Pulse (!) 59   Temp (!) 97.4 F (36.3 C) (Oral)   Resp 18   SpO2 95%   Visual Acuity Right Eye Distance:   Left Eye Distance:   Bilateral Distance:    Right Eye Near:   Left Eye Near:    Bilateral Near:     Physical Exam  Constitutional: She is oriented to person, place, and time. She appears well-developed and well-nourished. No distress.  HENT:  Head: Normocephalic and atraumatic.  Eyes: Pupils are equal, round, and reactive to light. No scleral icterus.  Neck: Normal range of motion.  Neck supple.  Cardiovascular: Normal rate, regular rhythm and normal heart sounds.  Pulmonary/Chest: Effort normal and breath sounds normal.  Abdominal: She exhibits no distension.  Neurological: She is alert and oriented to person, place, and time. No cranial nerve deficit.  Skin: Skin is warm and dry.  Psychiatric: She has a normal mood and affect. Her behavior is normal.  Vitals reviewed.    UC Treatments / Results  Labs (all labs ordered are listed, but only abnormal results are displayed) Labs Reviewed  POCT FASTING CBG [facility] MANUAL ENTRY - Abnormal; Notable for the following components:      Result Value   POCT Glucose ([facility]) 69 (*)    All other components within normal limits    EKG  EKG Interpretation None       Radiology No results found.  Procedures Procedures (including critical care time)  Medications Ordered in UC Medications - No data to display   Initial Impression / Assessment and Plan / UC Course  I have reviewed the triage vital signs and the nursing notes.  Pertinent labs & imaging results that were available during my care of the patient were reviewed by me and considered in my medical decision making (see chart for details).       Final Clinical Impressions(s) / UC Diagnoses   Final diagnoses:  Hypoglycemia due to insulin  Lightheadedness  She likely had hypoglycemia from not eating enough food this morning after taking her normal dose of insulin.  No evidence for acute cardiorespiratory cause for lightheadedness, and the lightheadedness has completely resolved now without any residual symptoms.  ED Discharge Orders    None    Plans: Options discussed.  She declined any other  testing or intervention.  She understands to always eat regular amount of meals when she takes her insulin. Follow-up with Dr. Loanne Drilling, her diabetes specialist within 1 week. Precautions discussed. Red flags discussed. Questions invited and answered. Patient voiced  understanding and agreement.    Controlled Substance Prescriptions [place] Controlled Substance Registry consulted? Not Applicable   Jacqulyn Cane, MD 05/06/17 2207

## 2017-05-13 ENCOUNTER — Encounter: Payer: Self-pay | Admitting: Endocrinology

## 2017-05-13 ENCOUNTER — Ambulatory Visit (INDEPENDENT_AMBULATORY_CARE_PROVIDER_SITE_OTHER): Payer: Medicare Other | Admitting: Endocrinology

## 2017-05-13 VITALS — BP 110/62 | HR 65 | Wt 194.8 lb

## 2017-05-13 DIAGNOSIS — N185 Chronic kidney disease, stage 5: Secondary | ICD-10-CM

## 2017-05-13 DIAGNOSIS — E1122 Type 2 diabetes mellitus with diabetic chronic kidney disease: Secondary | ICD-10-CM

## 2017-05-13 DIAGNOSIS — Z794 Long term (current) use of insulin: Secondary | ICD-10-CM

## 2017-05-13 DIAGNOSIS — E890 Postprocedural hypothyroidism: Secondary | ICD-10-CM | POA: Diagnosis not present

## 2017-05-13 LAB — TSH: TSH: 3.59 u[IU]/mL (ref 0.35–4.50)

## 2017-05-13 LAB — POCT GLYCOSYLATED HEMOGLOBIN (HGB A1C): Hemoglobin A1C: 8

## 2017-05-13 NOTE — Patient Instructions (Addendum)
check your blood sugar twice a day.  vary the time of day when you check, between before the 3 meals, and at bedtime.  also check if you have symptoms of your blood sugar being too high or too low.  please keep a record of the readings and bring it to your next appointment here.  You can write it on any piece of paper.  please call us sooner if your blood sugar goes below 70, or if you have a lot of readings over 200.  Please continue the same insulins On this type of insulin schedule, you should eat meals on a regular schedule.  If a meal is missed or significantly delayed, your blood sugar could go low.   blood tests are requested for you today.  We'll let you know about the results. Please come back for a follow-up appointment in 3 months.

## 2017-05-13 NOTE — Progress Notes (Signed)
Subjective:    Patient ID: Sandy Roy, female    DOB: 23-Aug-1950, 67 y.o.   MRN: [id number]  HPI Pt returns for f/u of diabetes mellitus: DM type: insulin-requiring type 2 Dx'ed: 5784 Complications: CAD and renal failure (transplant 2016).   Therapy: insulin since 2015.   GDM: never.   DKA: never.  Severe hypoglycemia: never.   Pancreatitis: never.   Other: she has requested a QD, inexpensive insulin regimen; she is chronically on low-dose prednisone; the pattern of her cbg's indicates she needs R and N QAM, but no insulin later in the day.  Interval history: She seldom has hypoglycemia, and these episodes are mild.  This happens in the afternoon, when she misses lunch.  She never misses the insulin.  She does not check cbg's.  pt had thyroidectomy for 2 small foci of papillary cancer (2008).  foci were not large enough to warrant RAI.   Past Medical History:  Diagnosis Date  . Chronic kidney disease    ESRD-  Hemo- M_W_F  . CONGESTIVE HEART FAILURE 12/14/2006  . CORONARY ARTERY DISEASE 12/14/2006  . Depression    not recently  . DIABETES MELLITUS, TYPE II 12/14/2006   Type 2  . HYPERLIPIDEMIA 12/14/2006  . HYPERTENSION 12/14/2006  . Hypoparathyroidism ([place]) 12/21/2006  . Hypopotassemia 01/13/2007  . HYPOTHYROIDISM, POSTSURGICAL 12/15/2006  . NEOPLASM, MALIGNANT, THYROID GLAND 12/15/2006   Stage 1 papillary adenocarcinoma, one very small focus each lobe (48mm, 36mm)  6/09: CT neck: no evidence of recurrence  . OBSTRUCTIVE SLEEP APNEA 02/23/2007   does not use a cpap  . Orbital fracture ([facility])    left eye  . Shortness of breath dyspnea    with exertion    Past Surgical History:  Procedure Laterality Date  . BASCILIC VEIN TRANSPOSITION Right 02/07/2014   Procedure: FIRST STAGE BASILIC VEIN TRANSPOSITION;  Surgeon: Conrad Hollister, MD;  Location: [facility];  Service: Vascular;  Laterality: Right;  . BASCILIC VEIN TRANSPOSITION Right 05/30/2014   Procedure: SECOND STAGE BASILIC VEIN  TRANSPOSITION;  Surgeon: Conrad [place], MD;  Location: [facility];  Service: Vascular;  Laterality: Right;  . BREAST SURGERY Left    nipple leaking, had surgery to fix the leak  . CARDIAC CATHETERIZATION     ? 10 years ago  . CHOLECYSTECTOMY    . COLONOSCOPY    . ORBITAL FRACTURE SURGERY    . OVARY SURGERY    . THYROIDECTOMY  2008  . TONSILLECTOMY    . TUBAL LIGATION      Social History   Socioeconomic History  . Marital status: Widowed    Spouse name: Not on file  . Number of children: Not on file  . Years of education: Not on file  . Highest education level: Not on file  Social Needs  . Financial resource strain: Not on file  . Food insecurity - worry: Not on file  . Food insecurity - inability: Not on file  . Transportation needs - medical: Not on file  . Transportation needs - non-medical: Not on file  Occupational History  . Not on file  Tobacco Use  . Smoking status: Former Smoker    Types: Cigarettes  . Smokeless tobacco: Never Used  . Tobacco comment: "quit years ago"  Substance and Sexual Activity  . Alcohol use: No    Alcohol/week: 0.0 oz  . Drug use: No  . Sexual activity: Not on file  Other Topics Concern  . Not on file  Social History Narrative  . Not on file    Current Outpatient Medications on File Prior to Visit  Medication Sig Dispense Refill  . amLODipine (NORVASC) 10 MG tablet Take 10 mg by mouth daily.     Marland Kitchen aspirin (BAYER LOW STRENGTH) 81 MG EC tablet Take 81 mg by mouth daily.      Marland Kitchen atorvastatin (LIPITOR) 10 MG tablet Take 10 mg by mouth daily.    Marland Kitchen CALCIUM PO Take by mouth.    . cloNIDine (CATAPRES) 0.2 MG tablet Take 0.1 mg by mouth 2 (two) times daily.     . fish oil-omega-3 fatty acids 1000 MG capsule Take 1 capsule by mouth daily.      Marland Kitchen glucose blood (ONETOUCH VERIO) test strip USE ONE STRIP TO CHECK GLUCOSE TWICE DAILY Dx code: E11.9 200 each 3  . HUMULIN R 100 UNIT/ML injection INJECT 35 UNITS INTO THE SKIN DAILY WITH BREAKFAST 10 mL 11    . insulin NPH Human (HUMULIN N) 100 UNIT/ML injection Inject 0.45 mLs (45 Units total) into the skin every morning. 20 mL 11  . Insulin Syringe-Needle U-100 (B-D INSULIN SYRINGE) 31G X 5/16" 0.3 ML MISC Use 1 time per day. 100 each 1  . labetalol (NORMODYNE) 100 MG tablet 2 in the morning 2 in the evening  2  . Lancets (ONETOUCH ULTRASOFT) lancets Use to check blood sugar 2 times per day 100 each 2  . levothyroxine (SYNTHROID, LEVOTHROID) 100 MCG tablet Take 1 tablet (100 mcg total) by mouth daily before breakfast. 90 tablet 3  . Multiple Vitamins-Minerals (MULTIVITAMIN WITH MINERALS) tablet Take 1 tablet by mouth daily.    . mycophenolate (MYFORTIC) 180 MG EC tablet Take 180 mg by mouth 2 (two) times daily.    . nitroGLYCERIN (NITROSTAT) 0.4 MG SL tablet Place 1 tablet (0.4 mg total) under the tongue every 5 (five) minutes as needed. 25 tablet 5  . omeprazole (PRILOSEC) 20 MG capsule Take 20 mg by mouth daily.    . predniSONE (DELTASONE) 5 MG tablet Take 5 mg by mouth daily with breakfast.    . sertraline (ZOLOFT) 50 MG tablet Take 50 mg by mouth daily.    Marland Kitchen sulfamethoxazole-trimethoprim (BACTRIM,SEPTRA) 400-80 MG per tablet 1 tablet. On Monday Wednesday and Friday    . tacrolimus (PROGRAF) 1 MG capsule Take 5 mg by mouth. 6 tabs in the morning 6 tabs in the evening    . triazolam (HALCION) 0.25 MG tablet 2 tablets by mouth at bedtime     . zinc gluconate 50 MG tablet Take 50 mg by mouth daily.     No current facility-administered medications on file prior to visit.     No Known Allergies  Family History  Problem Relation Age of Onset  . Heart failure Mother   . Diabetes Mother   . Cancer Father   . Cancer Other   . COPD Other   . Hypertension Other   . Hyperlipidemia Other   . Stroke Other   . Heart disease Other   . Diabetes Other     BP 110/62 (BP Location: Left Arm, Patient Position: Sitting, Cuff Size: Normal)   Pulse 65   Wt 194 lb 12.8 oz (88.4 kg)   SpO2 95%   BMI  34.51 kg/m    Review of Systems Denies LOC    Objective:   Physical Exam VITAL SIGNS:  See vs page GENERAL: no distress Pulses: foot pulses are intact bilaterally.   MSK: no  deformity of the feet or ankles.  CV: no edema of the legs or ankles Skin:  no ulcer on the feet or ankles.  normal color and temp on the feet and ankles.   Neuro: sensation is intact to touch on the feet and ankles.    Lab Results  Component Value Date   HGBA1C 8.0 05/13/2017    Lab Results  Component Value Date   PTH 19 01/13/2017   CALCIUM 8.7 01/13/2017   PHOS 3.8 01/22/2014       Assessment & Plan:  Insulin-requiring type 2 DM, with CAD: worse: we discussed.  She declines to increase insulin.   Noncompliance with cbg monitoring: this complicates the rx of DM  Patient Instructions  check your blood sugar twice a day.  vary the time of day when you check, between before the 3 meals, and at bedtime.  also check if you have symptoms of your blood sugar being too high or too low.  please keep a record of the readings and bring it to your next appointment here.  You can write it on any piece of paper.  please call us sooner if your blood sugar goes below 70, or if you have a lot of readings over 200.  Please continue the same insulins On this type of insulin schedule, you should eat meals on a regular schedule.  If a meal is missed or significantly delayed, your blood sugar could go low.   blood tests are requested for you today.  We'll let you know about the results. Please come back for a follow-up appointment in 3 months.

## 2017-07-27 DIAGNOSIS — Z940 Kidney transplant status: Secondary | ICD-10-CM | POA: Diagnosis not present

## 2017-07-29 DIAGNOSIS — Z940 Kidney transplant status: Secondary | ICD-10-CM | POA: Diagnosis not present

## 2017-08-12 DIAGNOSIS — Z940 Kidney transplant status: Secondary | ICD-10-CM | POA: Diagnosis not present

## 2017-08-13 ENCOUNTER — Encounter: Payer: Self-pay | Admitting: Endocrinology

## 2017-08-13 ENCOUNTER — Ambulatory Visit (INDEPENDENT_AMBULATORY_CARE_PROVIDER_SITE_OTHER): Payer: Medicare Other | Admitting: Endocrinology

## 2017-08-13 VITALS — BP 110/58 | HR 61 | Wt 195.0 lb

## 2017-08-13 DIAGNOSIS — Z794 Long term (current) use of insulin: Secondary | ICD-10-CM

## 2017-08-13 DIAGNOSIS — E1122 Type 2 diabetes mellitus with diabetic chronic kidney disease: Secondary | ICD-10-CM | POA: Diagnosis not present

## 2017-08-13 DIAGNOSIS — N185 Chronic kidney disease, stage 5: Secondary | ICD-10-CM | POA: Diagnosis not present

## 2017-08-13 LAB — POCT GLYCOSYLATED HEMOGLOBIN (HGB A1C): Hemoglobin A1C: 7.3 % — AB (ref 4.0–5.6)

## 2017-08-13 NOTE — Progress Notes (Signed)
Subjective:    Patient ID: Sandy Roy, female    DOB: [birth year], 67 y.o.   MRN: [id number]  HPI Pt returns for f/u of diabetes mellitus: DM type: insulin-requiring type 2 Dx'ed: 9242 Complications: CAD and renal failure (transplant 2016).   Therapy: insulin since 2015.   GDM: never.   DKA: never.  Severe hypoglycemia: never.   Pancreatitis: never.   Other: she has requested a QD, inexpensive insulin regimen; she is chronically on low-dose prednisone; the pattern of her cbg's indicates she needs R and N QAM, but no insulin later in the day.  Interval history: pt states she feels well in general. She never misses the insulin.  She does not check cbg's.   pt had thyroidectomy for 2 small foci of papillary cancer (2008).  foci were not large enough to warrant RAI.   Past Medical History:  Diagnosis Date  . Chronic kidney disease    ESRD-  Hemo- M_W_F  . CONGESTIVE HEART FAILURE 12/14/2006  . CORONARY ARTERY DISEASE 12/14/2006  . Depression    not recently  . DIABETES MELLITUS, TYPE II 12/14/2006   Type 2  . HYPERLIPIDEMIA 12/14/2006  . HYPERTENSION 12/14/2006  . Hypoparathyroidism ([place]) 12/21/2006  . Hypopotassemia 01/13/2007  . HYPOTHYROIDISM, POSTSURGICAL 12/15/2006  . NEOPLASM, MALIGNANT, THYROID GLAND 12/15/2006   Stage 1 papillary adenocarcinoma, one very small focus each lobe (19mm, 66mm)  6/09: CT neck: no evidence of recurrence  . OBSTRUCTIVE SLEEP APNEA 02/23/2007   does not use a cpap  . Orbital fracture ([facility])    left eye  . Shortness of breath dyspnea    with exertion    Past Surgical History:  Procedure Laterality Date  . BASCILIC VEIN TRANSPOSITION Right 02/07/2014   Procedure: FIRST STAGE BASILIC VEIN TRANSPOSITION;  Surgeon: Conrad Pataskala, MD;  Location: [facility];  Service: Vascular;  Laterality: Right;  . BASCILIC VEIN TRANSPOSITION Right 05/30/2014   Procedure: SECOND STAGE BASILIC VEIN TRANSPOSITION;  Surgeon: Conrad [place], MD;  Location: [facility];  Service:  Vascular;  Laterality: Right;  . BREAST SURGERY Left    nipple leaking, had surgery to fix the leak  . CARDIAC CATHETERIZATION     ? 10 years ago  . CHOLECYSTECTOMY    . COLONOSCOPY    . ORBITAL FRACTURE SURGERY    . OVARY SURGERY    . THYROIDECTOMY  2008  . TONSILLECTOMY    . TUBAL LIGATION      Social History   Socioeconomic History  . Marital status: Widowed    Spouse name: Not on file  . Number of children: Not on file  . Years of education: Not on file  . Highest education level: Not on file  Occupational History  . Not on file  Social Needs  . Financial resource strain: Not on file  . Food insecurity:    Worry: Not on file    Inability: Not on file  . Transportation needs:    Medical: Not on file    Non-medical: Not on file  Tobacco Use  . Smoking status: Former Smoker    Types: Cigarettes  . Smokeless tobacco: Never Used  . Tobacco comment: "quit years ago"  Substance and Sexual Activity  . Alcohol use: No    Alcohol/week: 0.0 oz  . Drug use: No  . Sexual activity: Not on file  Lifestyle  . Physical activity:    Days per week: Not on file    Minutes per session: Not  on file  . Stress: Not on file  Relationships  . Social connections:    Talks on phone: Not on file    Gets together: Not on file    Attends religious service: Not on file    Active member of club or organization: Not on file    Attends meetings of clubs or organizations: Not on file    Relationship status: Not on file  . Intimate partner violence:    Fear of current or ex partner: Not on file    Emotionally abused: Not on file    Physically abused: Not on file    Forced sexual activity: Not on file  Other Topics Concern  . Not on file  Social History Narrative  . Not on file    Current Outpatient Medications on File Prior to Visit  Medication Sig Dispense Refill  . amLODipine (NORVASC) 10 MG tablet Take 10 mg by mouth daily.     Marland Kitchen aspirin (BAYER LOW STRENGTH) 81 MG EC tablet  Take 81 mg by mouth daily.      Marland Kitchen atorvastatin (LIPITOR) 10 MG tablet Take 10 mg by mouth daily.    Marland Kitchen CALCIUM PO Take by mouth.    . cloNIDine (CATAPRES) 0.2 MG tablet Take 0.1 mg by mouth 2 (two) times daily.     . fish oil-omega-3 fatty acids 1000 MG capsule Take 1 capsule by mouth daily.      Marland Kitchen glucose blood (ONETOUCH VERIO) test strip USE ONE STRIP TO CHECK GLUCOSE TWICE DAILY Dx code: E11.9 200 each 3  . HUMULIN R 100 UNIT/ML injection INJECT 35 UNITS INTO THE SKIN DAILY WITH BREAKFAST 10 mL 11  . insulin NPH Human (HUMULIN N) 100 UNIT/ML injection Inject 0.45 mLs (45 Units total) into the skin every morning. 20 mL 11  . Insulin Syringe-Needle U-100 (B-D INSULIN SYRINGE) 31G X 5/16" 0.3 ML MISC Use 1 time per day. 100 each 1  . labetalol (NORMODYNE) 100 MG tablet 2 in the morning 2 in the evening  2  . Lancets (ONETOUCH ULTRASOFT) lancets Use to check blood sugar 2 times per day 100 each 2  . levothyroxine (SYNTHROID, LEVOTHROID) 100 MCG tablet Take 1 tablet (100 mcg total) by mouth daily before breakfast. 90 tablet 3  . Multiple Vitamins-Minerals (MULTIVITAMIN WITH MINERALS) tablet Take 1 tablet by mouth daily.    . mycophenolate (MYFORTIC) 180 MG EC tablet Take 180 mg by mouth 2 (two) times daily.    . nitroGLYCERIN (NITROSTAT) 0.4 MG SL tablet Place 1 tablet (0.4 mg total) under the tongue every 5 (five) minutes as needed. 25 tablet 5  . omeprazole (PRILOSEC) 20 MG capsule Take 20 mg by mouth daily.    . predniSONE (DELTASONE) 5 MG tablet Take 5 mg by mouth daily with breakfast.    . sertraline (ZOLOFT) 50 MG tablet Take 50 mg by mouth daily.    Marland Kitchen sulfamethoxazole-trimethoprim (BACTRIM,SEPTRA) 400-80 MG per tablet 1 tablet. On Monday Wednesday and Friday    . tacrolimus (PROGRAF) 1 MG capsule Take 5 mg by mouth. 6 tabs in the morning 6 tabs in the evening    . triazolam (HALCION) 0.25 MG tablet 2 tablets by mouth at bedtime     . zinc gluconate 50 MG tablet Take 50 mg by mouth daily.       No current facility-administered medications on file prior to visit.     No Known Allergies  Family History  Problem Relation Age of Onset  .  Heart failure Mother   . Diabetes Mother   . Cancer Father   . Cancer Other   . COPD Other   . Hypertension Other   . Hyperlipidemia Other   . Stroke Other   . Heart disease Other   . Diabetes Other     BP (!) 110/58   Pulse 61   Wt 195 lb (88.5 kg)   SpO2 97%   BMI 34.54 kg/m    Review of Systems She denies hypoglycemia.      Objective:   Physical Exam VITAL SIGNS:  See vs page GENERAL: no distress Pulses: foot pulses are intact bilaterally.   MSK: no deformity of the feet or ankles.  CV: no edema of the legs or ankles Skin:  no ulcer on the feet or ankles.  normal color and temp on the feet and ankles.  Neuro: sensation is intact to touch on the feet and ankles.    Lab Results  Component Value Date   HGBA1C 7.3 (A) 08/13/2017       Assessment & Plan:  Insulin-requiring type 2 DM, with renal failure: this is the best control this pt should aim for, given this regimen, which does match insulin to her changing needs throughout the day  Patient Instructions  check your blood sugar twice a day.  vary the time of day when you check, between before the 3 meals, and at bedtime.  also check if you have symptoms of your blood sugar being too high or too low.  please keep a record of the readings and bring it to your next appointment here.  You can write it on any piece of paper.  please call us sooner if your blood sugar goes below 70, or if you have a lot of readings over 200.  Please continue the same insulins.  On this type of insulin schedule, you should eat meals on a regular schedule.  If a meal is missed or significantly delayed, your blood sugar could go low.  Please come back for a follow-up appointment in 4 months.

## 2017-08-13 NOTE — Patient Instructions (Addendum)
check your blood sugar twice a day.  vary the time of day when you check, between before the 3 meals, and at bedtime.  also check if you have symptoms of your blood sugar being too high or too low.  please keep a record of the readings and bring it to your next appointment here.  You can write it on any piece of paper.  please call us sooner if your blood sugar goes below 70, or if you have a lot of readings over 200.  Please continue the same insulins.  On this type of insulin schedule, you should eat meals on a regular schedule.  If a meal is missed or significantly delayed, your blood sugar could go low.  Please come back for a follow-up appointment in 4 months.

## 2017-09-02 DIAGNOSIS — B370 Candidal stomatitis: Secondary | ICD-10-CM | POA: Diagnosis not present

## 2017-09-02 DIAGNOSIS — I770 Arteriovenous fistula, acquired: Secondary | ICD-10-CM | POA: Diagnosis not present

## 2017-09-02 DIAGNOSIS — B349 Viral infection, unspecified: Secondary | ICD-10-CM | POA: Diagnosis not present

## 2017-09-02 DIAGNOSIS — Z940 Kidney transplant status: Secondary | ICD-10-CM | POA: Diagnosis not present

## 2017-09-02 DIAGNOSIS — F450 Somatization disorder: Secondary | ICD-10-CM | POA: Diagnosis not present

## 2017-09-02 DIAGNOSIS — M7989 Other specified soft tissue disorders: Secondary | ICD-10-CM | POA: Diagnosis not present

## 2017-09-02 DIAGNOSIS — F419 Anxiety disorder, unspecified: Secondary | ICD-10-CM | POA: Diagnosis not present

## 2017-09-02 DIAGNOSIS — I10 Essential (primary) hypertension: Secondary | ICD-10-CM | POA: Diagnosis not present

## 2017-09-03 DIAGNOSIS — Z794 Long term (current) use of insulin: Secondary | ICD-10-CM | POA: Diagnosis not present

## 2017-09-03 DIAGNOSIS — Z792 Long term (current) use of antibiotics: Secondary | ICD-10-CM | POA: Diagnosis not present

## 2017-09-03 DIAGNOSIS — Z4822 Encounter for aftercare following kidney transplant: Secondary | ICD-10-CM | POA: Diagnosis not present

## 2017-09-03 DIAGNOSIS — Z9861 Coronary angioplasty status: Secondary | ICD-10-CM | POA: Diagnosis not present

## 2017-09-03 DIAGNOSIS — E669 Obesity, unspecified: Secondary | ICD-10-CM | POA: Diagnosis not present

## 2017-09-03 DIAGNOSIS — I150 Renovascular hypertension: Secondary | ICD-10-CM | POA: Diagnosis not present

## 2017-09-03 DIAGNOSIS — Z79899 Other long term (current) drug therapy: Secondary | ICD-10-CM | POA: Diagnosis not present

## 2017-09-03 DIAGNOSIS — E785 Hyperlipidemia, unspecified: Secondary | ICD-10-CM | POA: Diagnosis not present

## 2017-09-03 DIAGNOSIS — Z7952 Long term (current) use of systemic steroids: Secondary | ICD-10-CM | POA: Diagnosis not present

## 2017-09-03 DIAGNOSIS — I770 Arteriovenous fistula, acquired: Secondary | ICD-10-CM | POA: Diagnosis not present

## 2017-09-03 DIAGNOSIS — E119 Type 2 diabetes mellitus without complications: Secondary | ICD-10-CM | POA: Diagnosis not present

## 2017-09-03 DIAGNOSIS — E039 Hypothyroidism, unspecified: Secondary | ICD-10-CM | POA: Diagnosis not present

## 2017-09-03 DIAGNOSIS — Z87891 Personal history of nicotine dependence: Secondary | ICD-10-CM | POA: Diagnosis not present

## 2017-09-03 DIAGNOSIS — I2510 Atherosclerotic heart disease of native coronary artery without angina pectoris: Secondary | ICD-10-CM | POA: Diagnosis not present

## 2017-09-03 DIAGNOSIS — E1169 Type 2 diabetes mellitus with other specified complication: Secondary | ICD-10-CM | POA: Diagnosis not present

## 2017-09-03 DIAGNOSIS — B349 Viral infection, unspecified: Secondary | ICD-10-CM | POA: Diagnosis not present

## 2017-09-03 DIAGNOSIS — Z940 Kidney transplant status: Secondary | ICD-10-CM | POA: Diagnosis not present

## 2017-09-03 DIAGNOSIS — D899 Disorder involving the immune mechanism, unspecified: Secondary | ICD-10-CM | POA: Diagnosis not present

## 2017-09-03 DIAGNOSIS — Z79890 Hormone replacement therapy (postmenopausal): Secondary | ICD-10-CM | POA: Diagnosis not present

## 2017-09-03 DIAGNOSIS — I10 Essential (primary) hypertension: Secondary | ICD-10-CM | POA: Diagnosis not present

## 2017-09-03 DIAGNOSIS — Z7982 Long term (current) use of aspirin: Secondary | ICD-10-CM | POA: Diagnosis not present

## 2017-09-29 ENCOUNTER — Telehealth: Payer: Self-pay | Admitting: Endocrinology

## 2017-09-29 ENCOUNTER — Other Ambulatory Visit: Payer: Self-pay

## 2017-09-29 NOTE — Telephone Encounter (Signed)
Angie from [facility] ph# [phone] needs to know how patient is using insulin. Humalin N directions say 45 units in the a.m. Humalin R says 35 units in the a.m. Angie wants to make sure this is correct and that patient is supposed to be using both Humalin N and Humalin R.

## 2017-09-29 NOTE — Telephone Encounter (Signed)
I spoke with [facility] clarified that patient should be on both insulins.

## 2017-10-05 DIAGNOSIS — G4700 Insomnia, unspecified: Secondary | ICD-10-CM | POA: Diagnosis not present

## 2017-10-05 DIAGNOSIS — E1122 Type 2 diabetes mellitus with diabetic chronic kidney disease: Secondary | ICD-10-CM | POA: Diagnosis not present

## 2017-10-05 DIAGNOSIS — R609 Edema, unspecified: Secondary | ICD-10-CM | POA: Diagnosis not present

## 2017-10-05 DIAGNOSIS — N183 Chronic kidney disease, stage 3 (moderate): Secondary | ICD-10-CM | POA: Diagnosis not present

## 2017-10-05 DIAGNOSIS — Z7984 Long term (current) use of oral hypoglycemic drugs: Secondary | ICD-10-CM | POA: Diagnosis not present

## 2017-10-05 DIAGNOSIS — E782 Mixed hyperlipidemia: Secondary | ICD-10-CM | POA: Diagnosis not present

## 2017-10-05 DIAGNOSIS — I2510 Atherosclerotic heart disease of native coronary artery without angina pectoris: Secondary | ICD-10-CM | POA: Diagnosis not present

## 2017-10-05 DIAGNOSIS — F3341 Major depressive disorder, recurrent, in partial remission: Secondary | ICD-10-CM | POA: Diagnosis not present

## 2017-10-05 DIAGNOSIS — Z940 Kidney transplant status: Secondary | ICD-10-CM | POA: Diagnosis not present

## 2017-10-05 DIAGNOSIS — E039 Hypothyroidism, unspecified: Secondary | ICD-10-CM | POA: Diagnosis not present

## 2017-10-05 DIAGNOSIS — I129 Hypertensive chronic kidney disease with stage 1 through stage 4 chronic kidney disease, or unspecified chronic kidney disease: Secondary | ICD-10-CM | POA: Diagnosis not present

## 2017-10-29 DIAGNOSIS — R7989 Other specified abnormal findings of blood chemistry: Secondary | ICD-10-CM | POA: Diagnosis not present

## 2017-10-29 DIAGNOSIS — Z940 Kidney transplant status: Secondary | ICD-10-CM | POA: Diagnosis not present

## 2017-11-12 ENCOUNTER — Other Ambulatory Visit: Payer: Self-pay | Admitting: Endocrinology

## 2017-12-17 DIAGNOSIS — B349 Viral infection, unspecified: Secondary | ICD-10-CM | POA: Diagnosis not present

## 2017-12-17 DIAGNOSIS — Z940 Kidney transplant status: Secondary | ICD-10-CM | POA: Diagnosis not present

## 2017-12-17 DIAGNOSIS — M7989 Other specified soft tissue disorders: Secondary | ICD-10-CM | POA: Diagnosis not present

## 2017-12-17 DIAGNOSIS — Z23 Encounter for immunization: Secondary | ICD-10-CM | POA: Diagnosis not present

## 2017-12-17 DIAGNOSIS — I10 Essential (primary) hypertension: Secondary | ICD-10-CM | POA: Diagnosis not present

## 2017-12-17 DIAGNOSIS — B370 Candidal stomatitis: Secondary | ICD-10-CM | POA: Diagnosis not present

## 2017-12-17 DIAGNOSIS — R809 Proteinuria, unspecified: Secondary | ICD-10-CM | POA: Diagnosis not present

## 2017-12-17 DIAGNOSIS — F450 Somatization disorder: Secondary | ICD-10-CM | POA: Diagnosis not present

## 2017-12-17 DIAGNOSIS — I770 Arteriovenous fistula, acquired: Secondary | ICD-10-CM | POA: Diagnosis not present

## 2017-12-17 DIAGNOSIS — F419 Anxiety disorder, unspecified: Secondary | ICD-10-CM | POA: Diagnosis not present

## 2017-12-29 ENCOUNTER — Encounter: Payer: Self-pay | Admitting: Endocrinology

## 2017-12-29 ENCOUNTER — Ambulatory Visit (INDEPENDENT_AMBULATORY_CARE_PROVIDER_SITE_OTHER): Payer: Medicare Other | Admitting: Endocrinology

## 2017-12-29 VITALS — BP 140/82 | HR 64 | Ht 63.0 in | Wt 204.0 lb

## 2017-12-29 DIAGNOSIS — E1122 Type 2 diabetes mellitus with diabetic chronic kidney disease: Secondary | ICD-10-CM

## 2017-12-29 DIAGNOSIS — N185 Chronic kidney disease, stage 5: Secondary | ICD-10-CM | POA: Diagnosis not present

## 2017-12-29 DIAGNOSIS — Z794 Long term (current) use of insulin: Secondary | ICD-10-CM

## 2017-12-29 LAB — POCT GLYCOSYLATED HEMOGLOBIN (HGB A1C): Hemoglobin A1C: 7.8 % — AB (ref 4.0–5.6)

## 2017-12-29 NOTE — Progress Notes (Signed)
Subjective:    Patient ID: Sandy Roy, female    DOB: 1950-08-18, 67 y.o.   MRN: [id number]  HPI Pt returns for f/u of diabetes mellitus: DM type: insulin-requiring type 2 Dx'ed: 7017 Complications: CAD and renal failure (transplant 2016).   Therapy: insulin since 2015.   GDM: never.   DKA: never.  Severe hypoglycemia: never.   Pancreatitis: never.    Other: she has requested a QD, inexpensive insulin regimen; she is chronically on low-dose prednisone; the pattern of her cbg's indicates she needs R and N QAM, but no insulin later in the day. She does not check cbg's. Interval history: pt states she feels well in general. She never misses the insulin.   pt had thyroidectomy for 2 small foci of papillary cancer (2008).  foci were not large enough to warrant RAI.   Past Medical History:  Diagnosis Date  . Chronic kidney disease    ESRD-  Hemo- M_W_F  . CONGESTIVE HEART FAILURE 12/14/2006  . CORONARY ARTERY DISEASE 12/14/2006  . Depression    not recently  . DIABETES MELLITUS, TYPE II 12/14/2006   Type 2  . HYPERLIPIDEMIA 12/14/2006  . HYPERTENSION 12/14/2006  . Hypoparathyroidism ([place]) 12/21/2006  . Hypopotassemia 01/13/2007  . HYPOTHYROIDISM, POSTSURGICAL 12/15/2006  . NEOPLASM, MALIGNANT, THYROID GLAND 12/15/2006   Stage 1 papillary adenocarcinoma, one very small focus each lobe (25mm, 12mm)  6/09: CT neck: no evidence of recurrence  . OBSTRUCTIVE SLEEP APNEA 02/23/2007   does not use a cpap  . Orbital fracture    left eye  . Shortness of breath dyspnea    with exertion    Past Surgical History:  Procedure Laterality Date  . BASCILIC VEIN TRANSPOSITION Right 02/07/2014   Procedure: FIRST STAGE BASILIC VEIN TRANSPOSITION;  Surgeon: Conrad Bantam, MD;  Location: [facility];  Service: Vascular;  Laterality: Right;  . BASCILIC VEIN TRANSPOSITION Right 05/30/2014   Procedure: SECOND STAGE BASILIC VEIN TRANSPOSITION;  Surgeon: Conrad Buna, MD;  Location: [facility];  Service: Vascular;   Laterality: Right;  . BREAST SURGERY Left    nipple leaking, had surgery to fix the leak  . CARDIAC CATHETERIZATION     ? 10 years ago  . CHOLECYSTECTOMY    . COLONOSCOPY    . ORBITAL FRACTURE SURGERY    . OVARY SURGERY    . THYROIDECTOMY  2008  . TONSILLECTOMY    . TUBAL LIGATION      Social History   Socioeconomic History  . Marital status: Widowed    Spouse name: Not on file  . Number of children: Not on file  . Years of education: Not on file  . Highest education level: Not on file  Occupational History  . Not on file  Social Needs  . Financial resource strain: Not on file  . Food insecurity:    Worry: Not on file    Inability: Not on file  . Transportation needs:    Medical: Not on file    Non-medical: Not on file  Tobacco Use  . Smoking status: Former Smoker    Types: Cigarettes  . Smokeless tobacco: Never Used  . Tobacco comment: "quit years ago"  Substance and Sexual Activity  . Alcohol use: No    Alcohol/week: 0.0 standard drinks  . Drug use: No  . Sexual activity: Not on file  Lifestyle  . Physical activity:    Days per week: Not on file    Minutes per session: Not on  file  . Stress: Not on file  Relationships  . Social connections:    Talks on phone: Not on file    Gets together: Not on file    Attends religious service: Not on file    Active member of club or organization: Not on file    Attends meetings of clubs or organizations: Not on file    Relationship status: Not on file  . Intimate partner violence:    Fear of current or ex partner: Not on file    Emotionally abused: Not on file    Physically abused: Not on file    Forced sexual activity: Not on file  Other Topics Concern  . Not on file  Social History Narrative  . Not on file    Current Outpatient Medications on File Prior to Visit  Medication Sig Dispense Refill  . amLODipine (NORVASC) 10 MG tablet Take 10 mg by mouth daily.     Marland Kitchen aspirin (BAYER LOW STRENGTH) 81 MG EC tablet  Take 81 mg by mouth daily.      Marland Kitchen atorvastatin (LIPITOR) 10 MG tablet Take 10 mg by mouth daily.    Marland Kitchen CALCIUM PO Take by mouth.    . cloNIDine (CATAPRES) 0.2 MG tablet Take 0.1 mg by mouth 2 (two) times daily.     . fish oil-omega-3 fatty acids 1000 MG capsule Take 1 capsule by mouth daily.      Marland Kitchen glucose blood (ONETOUCH VERIO) test strip USE ONE STRIP TO CHECK GLUCOSE TWICE DAILY Dx code: E11.9 200 each 3  . HUMULIN N 100 UNIT/ML injection INJECT 45 UNITS INTO THE SKIN EVERY MORNING 20 mL 11  . HUMULIN R 100 UNIT/ML injection INJECT 35 UNITS INTO THE SKIN DAILY WITH BREAKFAST 10 mL 11  . Insulin Syringe-Needle U-100 (B-D INSULIN SYRINGE) 31G X 5/16" 0.3 ML MISC Use 1 time per day. 100 each 1  . labetalol (NORMODYNE) 100 MG tablet 2 in the morning 2 in the evening  2  . Lancets (ONETOUCH ULTRASOFT) lancets Use to check blood sugar 2 times per day 100 each 2  . levothyroxine (SYNTHROID, LEVOTHROID) 100 MCG tablet Take 1 tablet (100 mcg total) by mouth daily before breakfast. 90 tablet 3  . Multiple Vitamins-Minerals (MULTIVITAMIN WITH MINERALS) tablet Take 1 tablet by mouth daily.    . mycophenolate (MYFORTIC) 180 MG EC tablet Take 180 mg by mouth 2 (two) times daily.    . nitroGLYCERIN (NITROSTAT) 0.4 MG SL tablet Place 1 tablet (0.4 mg total) under the tongue every 5 (five) minutes as needed. 25 tablet 5  . omeprazole (PRILOSEC) 20 MG capsule Take 20 mg by mouth daily.    . predniSONE (DELTASONE) 5 MG tablet Take 5 mg by mouth daily with breakfast.    . sertraline (ZOLOFT) 50 MG tablet Take 50 mg by mouth daily.    Marland Kitchen sulfamethoxazole-trimethoprim (BACTRIM,SEPTRA) 400-80 MG per tablet 1 tablet. On Monday Wednesday and Friday    . tacrolimus (PROGRAF) 1 MG capsule Take 5 mg by mouth. 6 tabs in the morning 6 tabs in the evening    . triazolam (HALCION) 0.25 MG tablet 2 tablets by mouth at bedtime     . zinc gluconate 50 MG tablet Take 50 mg by mouth daily.     No current facility-administered  medications on file prior to visit.     No Known Allergies  Family History  Problem Relation Age of Onset  . Heart failure Mother   .  Diabetes Mother   . Cancer Father   . Cancer Other   . COPD Other   . Hypertension Other   . Hyperlipidemia Other   . Stroke Other   . Heart disease Other   . Diabetes Other     BP 140/82   Pulse 64   Ht 5\' 3"  (1.6 m)   Wt 204 lb (92.5 kg)   SpO2 96%   BMI 36.14 kg/m    Review of Systems She denies hypoglycemia.     Objective:   Physical Exam VITAL SIGNS:  See vs page GENERAL: no distress Pulses: foot pulses are intact bilaterally.   MSK: no deformity of the feet or ankles.  CV: trace bilat edema of the legs.   Skin:  no ulcer on the feet or ankles.  normal color and temp on the feet and ankles Neuro: sensation is intact to touch on the feet and ankles.    A1c=7.8%     Assessment & Plan:  HTN: is noted today Insulin-requiring type 2 DM, with renal failure: this is the best control this pt should aim for, given this regimen, which does match insulin to her changing needs throughout the day  Patient Instructions  Your blood pressure is high today.  Please see your primary care provider soon, to have it rechecked check your blood sugar twice a day.  vary the time of day when you check, between before the 3 meals, and at bedtime.  also check if you have symptoms of your blood sugar being too high or too low.  please keep a record of the readings and bring it to your next appointment here.  You can write it on any piece of paper.  please call us sooner if your blood sugar goes below 70, or if you have a lot of readings over 200.  Please continue the same insulins.  On this type of insulin schedule, you should eat meals on a regular schedule.  If a meal is missed or significantly delayed, your blood sugar could go low.  Please come back for a follow-up appointment in 4 months.

## 2017-12-29 NOTE — Patient Instructions (Addendum)
Your blood pressure is high today.  Please see your primary care provider soon, to have it rechecked check your blood sugar twice a day.  vary the time of day when you check, between before the 3 meals, and at bedtime.  also check if you have symptoms of your blood sugar being too high or too low.  please keep a record of the readings and bring it to your next appointment here.  You can write it on any piece of paper.  please call us sooner if your blood sugar goes below 70, or if you have a lot of readings over 200.  Please continue the same insulins.  On this type of insulin schedule, you should eat meals on a regular schedule.  If a meal is missed or significantly delayed, your blood sugar could go low.  Please come back for a follow-up appointment in 4 months.

## 2018-01-14 ENCOUNTER — Other Ambulatory Visit: Payer: Self-pay

## 2018-01-14 ENCOUNTER — Telehealth: Payer: Self-pay | Admitting: Endocrinology

## 2018-01-14 MED ORDER — LEVOTHYROXINE SODIUM 100 MCG PO TABS: 100.0000 ug | ORAL_TABLET | Freq: Every day | ORAL | 3 refills | Status: DC

## 2018-01-14 NOTE — Telephone Encounter (Signed)
Patient is wanting levothyroxine (SYNTHROID, LEVOTHROID) 100 MCG tablet sent to [facility]. Pharmacy called current pharmacy and they stated they had no refills and that we need to send the new refill to [facility]. Please advise, thanks Ph: [phone] Fax: [phone]   Rx sent as requested

## 2018-01-14 NOTE — Telephone Encounter (Signed)
Rx sent as requested.

## 2018-01-14 NOTE — Telephone Encounter (Signed)
Patient is wanting levothyroxine (SYNTHROID, LEVOTHROID) 100 MCG tablet sent to [facility]. Pharmacy called current pharmacy and they stated they had no refills and that we need to send the new refill to [facility]. Please advise, thanks Ph: [phone] Fax: [phone]

## 2018-01-22 DIAGNOSIS — Z940 Kidney transplant status: Secondary | ICD-10-CM | POA: Diagnosis not present

## 2018-01-29 DIAGNOSIS — Z940 Kidney transplant status: Secondary | ICD-10-CM | POA: Diagnosis not present

## 2018-03-01 ENCOUNTER — Telehealth: Payer: Self-pay | Admitting: Endocrinology

## 2018-03-01 NOTE — Telephone Encounter (Signed)
[facility] pharmacy called [facility]: patient called the Pharmacy to request refills of Humalin R and Humalin N insulin. Please call the above pharmacy ph# [phone] to clarify if patient is on both Humalin R and Humalin N and when patient should be taking the medication e.g. a.m/p.m.

## 2018-03-01 NOTE — Telephone Encounter (Signed)
Informed pharmacist that pt is ok being on both

## 2018-03-01 NOTE — Telephone Encounter (Signed)
There is nothing contraindicated about taking Humulin N and Novolin R together

## 2018-03-01 NOTE — Telephone Encounter (Signed)
Pt is on both and pharmacist stated that that is unusual, please advise

## 2018-04-01 DIAGNOSIS — Z940 Kidney transplant status: Secondary | ICD-10-CM | POA: Diagnosis not present

## 2018-04-07 ENCOUNTER — Other Ambulatory Visit: Payer: Self-pay | Admitting: Endocrinology

## 2018-04-08 DIAGNOSIS — I2510 Atherosclerotic heart disease of native coronary artery without angina pectoris: Secondary | ICD-10-CM | POA: Diagnosis not present

## 2018-04-08 DIAGNOSIS — F3341 Major depressive disorder, recurrent, in partial remission: Secondary | ICD-10-CM | POA: Diagnosis not present

## 2018-04-08 DIAGNOSIS — G4700 Insomnia, unspecified: Secondary | ICD-10-CM | POA: Diagnosis not present

## 2018-04-08 DIAGNOSIS — N183 Chronic kidney disease, stage 3 (moderate): Secondary | ICD-10-CM | POA: Diagnosis not present

## 2018-04-08 DIAGNOSIS — E039 Hypothyroidism, unspecified: Secondary | ICD-10-CM | POA: Diagnosis not present

## 2018-04-08 DIAGNOSIS — I129 Hypertensive chronic kidney disease with stage 1 through stage 4 chronic kidney disease, or unspecified chronic kidney disease: Secondary | ICD-10-CM | POA: Diagnosis not present

## 2018-04-08 DIAGNOSIS — E782 Mixed hyperlipidemia: Secondary | ICD-10-CM | POA: Diagnosis not present

## 2018-04-08 DIAGNOSIS — R609 Edema, unspecified: Secondary | ICD-10-CM | POA: Diagnosis not present

## 2018-04-08 DIAGNOSIS — L989 Disorder of the skin and subcutaneous tissue, unspecified: Secondary | ICD-10-CM | POA: Diagnosis not present

## 2018-04-08 DIAGNOSIS — E1122 Type 2 diabetes mellitus with diabetic chronic kidney disease: Secondary | ICD-10-CM | POA: Diagnosis not present

## 2018-04-08 DIAGNOSIS — Z940 Kidney transplant status: Secondary | ICD-10-CM | POA: Diagnosis not present

## 2018-04-21 DIAGNOSIS — L84 Corns and callosities: Secondary | ICD-10-CM | POA: Diagnosis not present

## 2018-04-21 DIAGNOSIS — Z794 Long term (current) use of insulin: Secondary | ICD-10-CM | POA: Diagnosis not present

## 2018-04-21 DIAGNOSIS — Z940 Kidney transplant status: Secondary | ICD-10-CM | POA: Diagnosis not present

## 2018-04-21 DIAGNOSIS — E119 Type 2 diabetes mellitus without complications: Secondary | ICD-10-CM | POA: Diagnosis not present

## 2018-04-21 DIAGNOSIS — I770 Arteriovenous fistula, acquired: Secondary | ICD-10-CM | POA: Diagnosis not present

## 2018-04-21 DIAGNOSIS — B349 Viral infection, unspecified: Secondary | ICD-10-CM | POA: Diagnosis not present

## 2018-04-21 DIAGNOSIS — I10 Essential (primary) hypertension: Secondary | ICD-10-CM | POA: Diagnosis not present

## 2018-04-28 DIAGNOSIS — L989 Disorder of the skin and subcutaneous tissue, unspecified: Secondary | ICD-10-CM | POA: Diagnosis not present

## 2018-05-03 ENCOUNTER — Ambulatory Visit: Payer: Medicare Other | Admitting: Endocrinology

## 2018-05-17 IMAGING — US US EXTREM LOW VENOUS*R*
1 series · 13 of 24 positions shown · non-contrast
Comparison: None.

CLINICAL DATA: Right lower extremity edema.



[Series 1: us extrem low venous*right* · 13 of 33 slices shown]
[im 1/33]
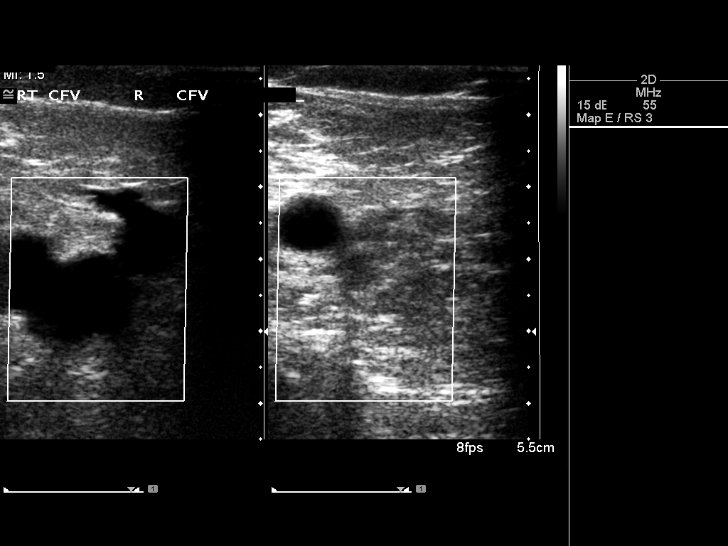
[im 3/33]
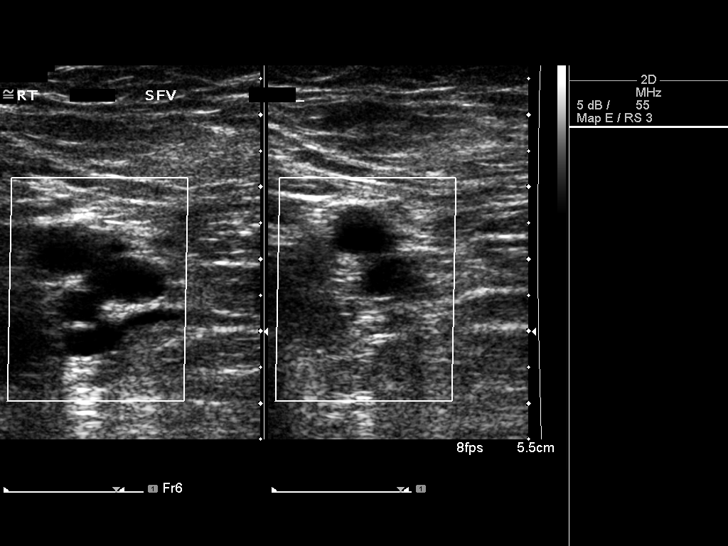
[im 6/33]
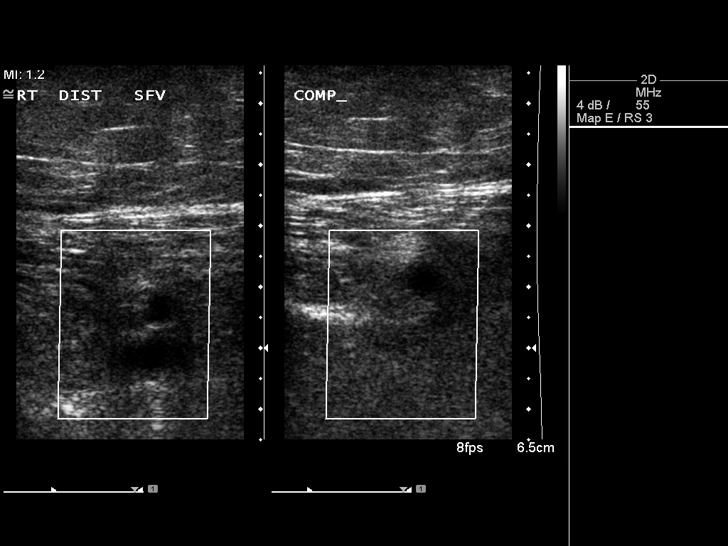
[im 9/33]
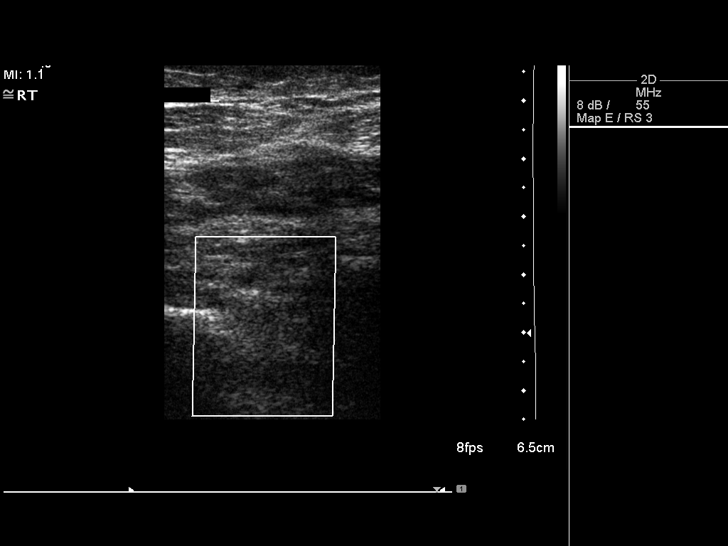
[im 12/33]
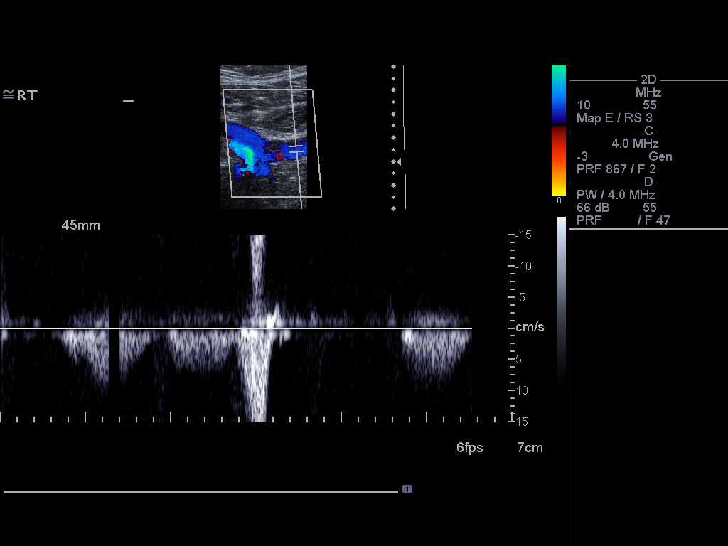
[im 14/33]
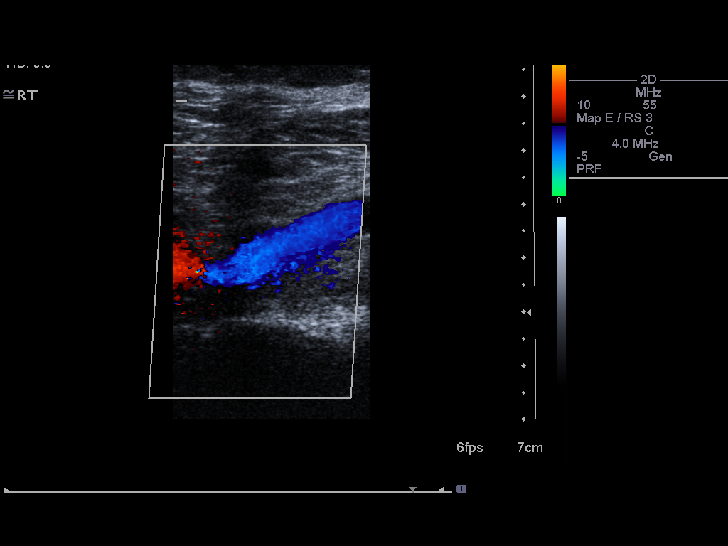
[im 17/33]
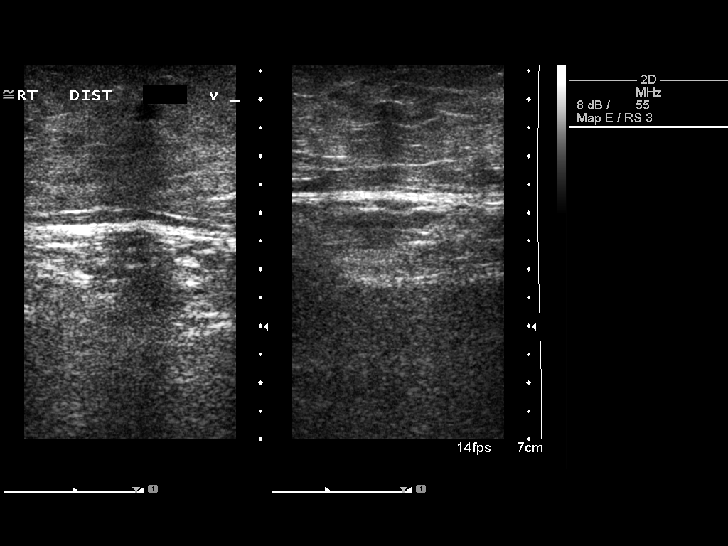
[im 19/33]
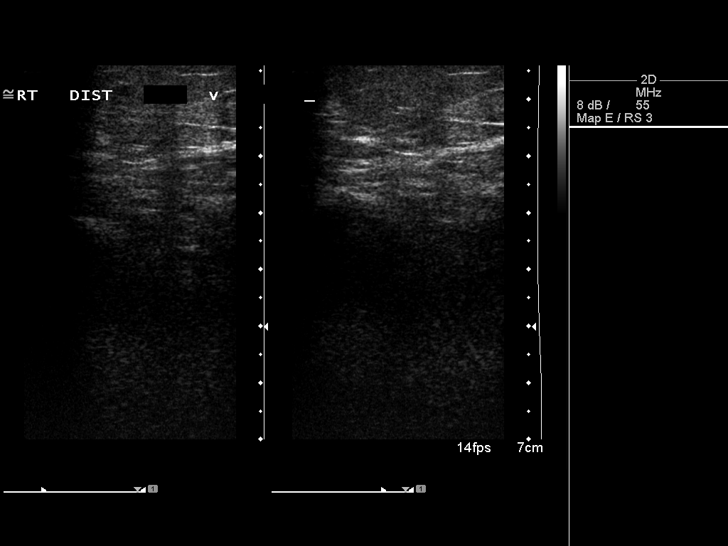
[im 21/33]
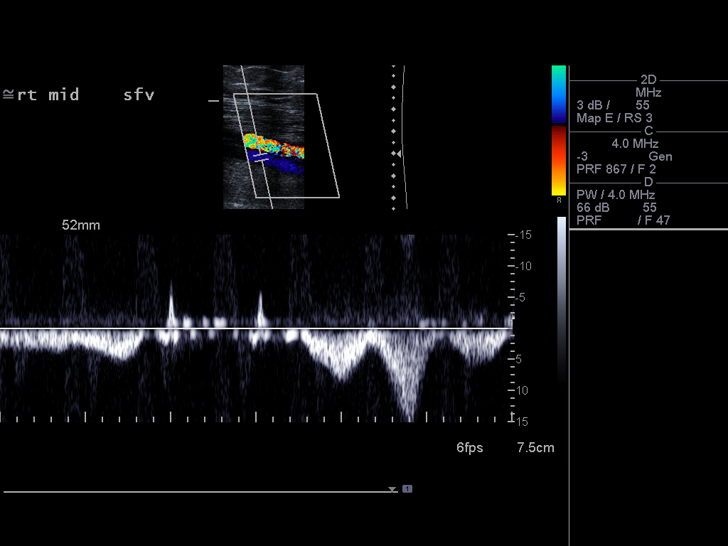
[im 24/33]
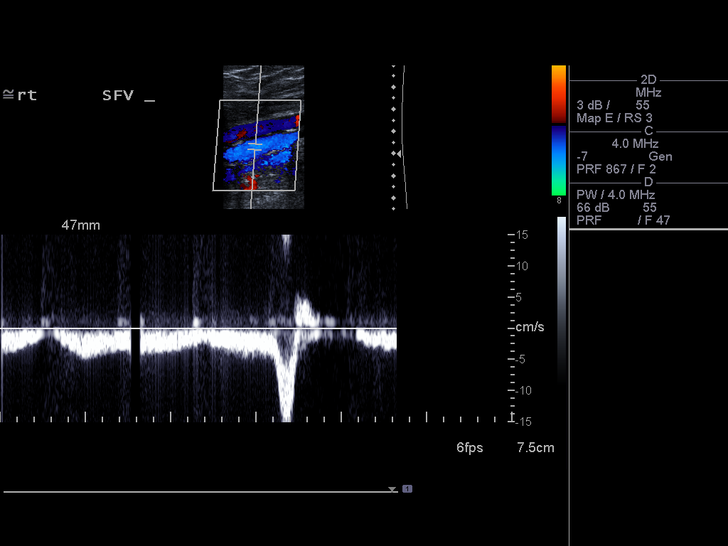
[im 27/33]
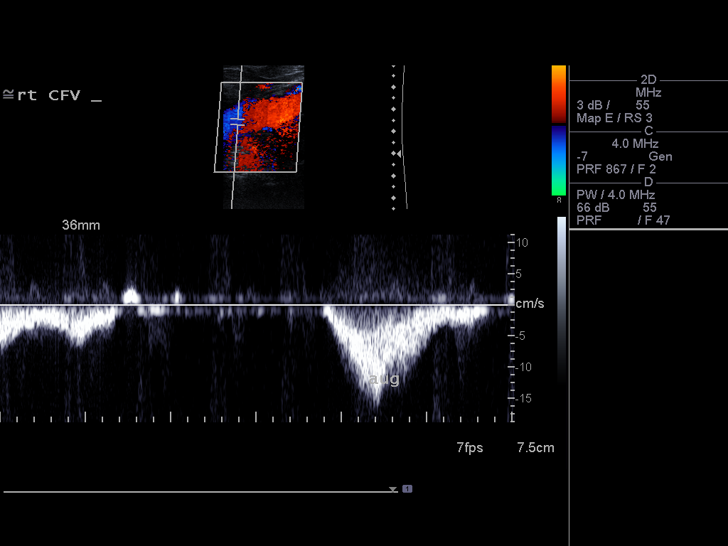
[im 30/33]
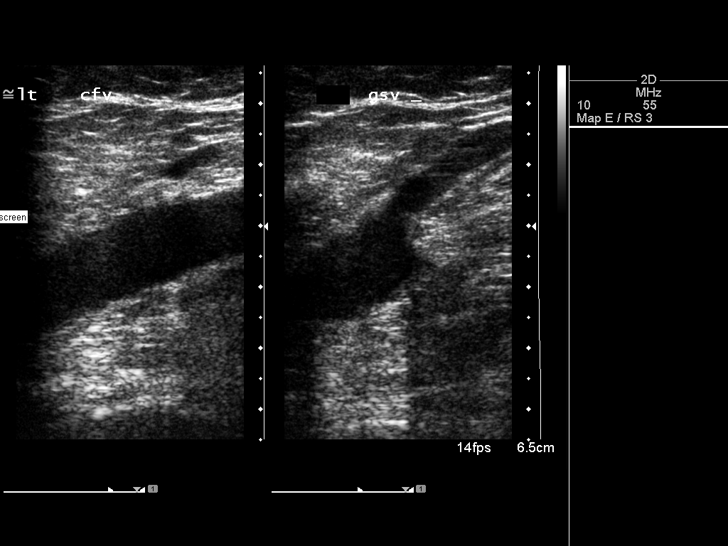
[im 33/33]
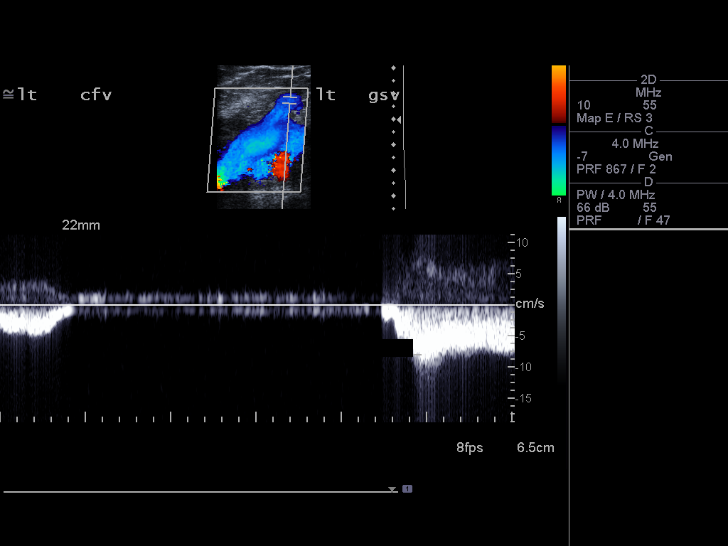

[13 of 24 positions shown; findings below may reference images not displayed]

FINDINGS: Contralateral Common Femoral Vein: Respiratory phasicity is normal
and symmetric with the symptomatic side. No evidence of thrombus.
Normal compressibility.

Common Femoral Vein: No evidence of thrombus. Normal
compressibility, respiratory phasicity and response to augmentation.

Saphenofemoral Junction: No evidence of thrombus. Normal
compressibility and flow on color Doppler imaging.

Profunda Femoral Vein: No evidence of thrombus. Normal
compressibility and flow on color Doppler imaging.

Femoral Vein: No evidence of thrombus. Normal compressibility,
respiratory phasicity and response to augmentation.

Popliteal Vein: No evidence of thrombus. Normal compressibility,
respiratory phasicity and response to augmentation.

Calf Veins: No evidence of thrombus. Normal compressibility and flow
on color Doppler imaging.

Superficial Great Saphenous Vein: No evidence of thrombus. Normal
compressibility and flow on color Doppler imaging.

Venous Reflux:  None.

Other Findings: No evidence of superficial thrombophlebitis or
abnormal fluid collection.
IMPRESSION: No evidence of right lower extremity deep venous thrombosis.

## 2018-06-07 DIAGNOSIS — R05 Cough: Secondary | ICD-10-CM | POA: Diagnosis not present

## 2018-07-08 DIAGNOSIS — N183 Chronic kidney disease, stage 3 (moderate): Secondary | ICD-10-CM | POA: Diagnosis not present

## 2018-07-08 DIAGNOSIS — N185 Chronic kidney disease, stage 5: Secondary | ICD-10-CM | POA: Diagnosis not present

## 2018-07-08 DIAGNOSIS — N184 Chronic kidney disease, stage 4 (severe): Secondary | ICD-10-CM | POA: Diagnosis not present

## 2018-07-08 DIAGNOSIS — F3341 Major depressive disorder, recurrent, in partial remission: Secondary | ICD-10-CM | POA: Diagnosis not present

## 2018-07-08 DIAGNOSIS — I129 Hypertensive chronic kidney disease with stage 1 through stage 4 chronic kidney disease, or unspecified chronic kidney disease: Secondary | ICD-10-CM | POA: Diagnosis not present

## 2018-07-08 DIAGNOSIS — I10 Essential (primary) hypertension: Secondary | ICD-10-CM | POA: Diagnosis not present

## 2018-07-08 DIAGNOSIS — I2510 Atherosclerotic heart disease of native coronary artery without angina pectoris: Secondary | ICD-10-CM | POA: Diagnosis not present

## 2018-07-08 DIAGNOSIS — E039 Hypothyroidism, unspecified: Secondary | ICD-10-CM | POA: Diagnosis not present

## 2018-07-08 DIAGNOSIS — I120 Hypertensive chronic kidney disease with stage 5 chronic kidney disease or end stage renal disease: Secondary | ICD-10-CM | POA: Diagnosis not present

## 2018-07-08 DIAGNOSIS — E1122 Type 2 diabetes mellitus with diabetic chronic kidney disease: Secondary | ICD-10-CM | POA: Diagnosis not present

## 2018-07-08 DIAGNOSIS — E782 Mixed hyperlipidemia: Secondary | ICD-10-CM | POA: Diagnosis not present

## 2018-09-08 DIAGNOSIS — Z130 Encounter for screening for diseases of the blood and blood-forming organs and certain disorders involving the immune mechanism: Secondary | ICD-10-CM | POA: Diagnosis not present

## 2018-09-08 DIAGNOSIS — Z5181 Encounter for therapeutic drug level monitoring: Secondary | ICD-10-CM | POA: Diagnosis not present

## 2018-09-08 DIAGNOSIS — Z13228 Encounter for screening for other metabolic disorders: Secondary | ICD-10-CM | POA: Diagnosis not present

## 2018-09-08 DIAGNOSIS — Z79899 Other long term (current) drug therapy: Secondary | ICD-10-CM | POA: Diagnosis not present

## 2018-09-08 DIAGNOSIS — Z1159 Encounter for screening for other viral diseases: Secondary | ICD-10-CM | POA: Diagnosis not present

## 2018-09-08 DIAGNOSIS — Z940 Kidney transplant status: Secondary | ICD-10-CM | POA: Diagnosis not present

## 2018-09-09 DIAGNOSIS — Z940 Kidney transplant status: Secondary | ICD-10-CM | POA: Diagnosis not present

## 2018-09-09 DIAGNOSIS — E119 Type 2 diabetes mellitus without complications: Secondary | ICD-10-CM | POA: Diagnosis not present

## 2018-09-09 DIAGNOSIS — I10 Essential (primary) hypertension: Secondary | ICD-10-CM | POA: Diagnosis not present

## 2018-09-09 DIAGNOSIS — D8989 Other specified disorders involving the immune mechanism, not elsewhere classified: Secondary | ICD-10-CM | POA: Diagnosis not present

## 2018-09-09 DIAGNOSIS — Z794 Long term (current) use of insulin: Secondary | ICD-10-CM | POA: Diagnosis not present

## 2018-09-28 DIAGNOSIS — Z940 Kidney transplant status: Secondary | ICD-10-CM | POA: Diagnosis not present

## 2018-09-28 DIAGNOSIS — E119 Type 2 diabetes mellitus without complications: Secondary | ICD-10-CM | POA: Diagnosis not present

## 2018-10-06 DIAGNOSIS — E039 Hypothyroidism, unspecified: Secondary | ICD-10-CM | POA: Diagnosis not present

## 2018-10-06 DIAGNOSIS — I10 Essential (primary) hypertension: Secondary | ICD-10-CM | POA: Diagnosis not present

## 2018-10-06 DIAGNOSIS — E1122 Type 2 diabetes mellitus with diabetic chronic kidney disease: Secondary | ICD-10-CM | POA: Diagnosis not present

## 2018-10-06 DIAGNOSIS — I2510 Atherosclerotic heart disease of native coronary artery without angina pectoris: Secondary | ICD-10-CM | POA: Diagnosis not present

## 2018-10-06 DIAGNOSIS — N184 Chronic kidney disease, stage 4 (severe): Secondary | ICD-10-CM | POA: Diagnosis not present

## 2018-10-06 DIAGNOSIS — F329 Major depressive disorder, single episode, unspecified: Secondary | ICD-10-CM | POA: Diagnosis not present

## 2018-10-06 DIAGNOSIS — F3340 Major depressive disorder, recurrent, in remission, unspecified: Secondary | ICD-10-CM | POA: Diagnosis not present

## 2018-10-06 DIAGNOSIS — E782 Mixed hyperlipidemia: Secondary | ICD-10-CM | POA: Diagnosis not present

## 2018-10-06 DIAGNOSIS — N183 Chronic kidney disease, stage 3 (moderate): Secondary | ICD-10-CM | POA: Diagnosis not present

## 2018-10-06 DIAGNOSIS — I120 Hypertensive chronic kidney disease with stage 5 chronic kidney disease or end stage renal disease: Secondary | ICD-10-CM | POA: Diagnosis not present

## 2018-10-06 DIAGNOSIS — N185 Chronic kidney disease, stage 5: Secondary | ICD-10-CM | POA: Diagnosis not present

## 2018-10-07 DIAGNOSIS — N183 Chronic kidney disease, stage 3 (moderate): Secondary | ICD-10-CM | POA: Diagnosis not present

## 2018-10-07 DIAGNOSIS — E039 Hypothyroidism, unspecified: Secondary | ICD-10-CM | POA: Diagnosis not present

## 2018-10-07 DIAGNOSIS — I2510 Atherosclerotic heart disease of native coronary artery without angina pectoris: Secondary | ICD-10-CM | POA: Diagnosis not present

## 2018-10-07 DIAGNOSIS — Z794 Long term (current) use of insulin: Secondary | ICD-10-CM | POA: Diagnosis not present

## 2018-10-07 DIAGNOSIS — F3341 Major depressive disorder, recurrent, in partial remission: Secondary | ICD-10-CM | POA: Diagnosis not present

## 2018-10-07 DIAGNOSIS — E782 Mixed hyperlipidemia: Secondary | ICD-10-CM | POA: Diagnosis not present

## 2018-10-07 DIAGNOSIS — Z6835 Body mass index (BMI) 35.0-35.9, adult: Secondary | ICD-10-CM | POA: Diagnosis not present

## 2018-10-07 DIAGNOSIS — I129 Hypertensive chronic kidney disease with stage 1 through stage 4 chronic kidney disease, or unspecified chronic kidney disease: Secondary | ICD-10-CM | POA: Diagnosis not present

## 2018-10-07 DIAGNOSIS — G4700 Insomnia, unspecified: Secondary | ICD-10-CM | POA: Diagnosis not present

## 2018-10-07 DIAGNOSIS — E1122 Type 2 diabetes mellitus with diabetic chronic kidney disease: Secondary | ICD-10-CM | POA: Diagnosis not present

## 2018-10-07 DIAGNOSIS — Z940 Kidney transplant status: Secondary | ICD-10-CM | POA: Diagnosis not present

## 2018-11-04 DIAGNOSIS — I120 Hypertensive chronic kidney disease with stage 5 chronic kidney disease or end stage renal disease: Secondary | ICD-10-CM | POA: Diagnosis not present

## 2018-11-04 DIAGNOSIS — N185 Chronic kidney disease, stage 5: Secondary | ICD-10-CM | POA: Diagnosis not present

## 2018-11-04 DIAGNOSIS — E782 Mixed hyperlipidemia: Secondary | ICD-10-CM | POA: Diagnosis not present

## 2018-11-04 DIAGNOSIS — I2510 Atherosclerotic heart disease of native coronary artery without angina pectoris: Secondary | ICD-10-CM | POA: Diagnosis not present

## 2018-11-04 DIAGNOSIS — N184 Chronic kidney disease, stage 4 (severe): Secondary | ICD-10-CM | POA: Diagnosis not present

## 2018-11-04 DIAGNOSIS — E039 Hypothyroidism, unspecified: Secondary | ICD-10-CM | POA: Diagnosis not present

## 2018-11-04 DIAGNOSIS — E1122 Type 2 diabetes mellitus with diabetic chronic kidney disease: Secondary | ICD-10-CM | POA: Diagnosis not present

## 2018-11-04 DIAGNOSIS — F329 Major depressive disorder, single episode, unspecified: Secondary | ICD-10-CM | POA: Diagnosis not present

## 2018-11-04 DIAGNOSIS — N183 Chronic kidney disease, stage 3 (moderate): Secondary | ICD-10-CM | POA: Diagnosis not present

## 2018-11-04 DIAGNOSIS — I129 Hypertensive chronic kidney disease with stage 1 through stage 4 chronic kidney disease, or unspecified chronic kidney disease: Secondary | ICD-10-CM | POA: Diagnosis not present

## 2018-11-04 DIAGNOSIS — I10 Essential (primary) hypertension: Secondary | ICD-10-CM | POA: Diagnosis not present

## 2018-11-09 DIAGNOSIS — F329 Major depressive disorder, single episode, unspecified: Secondary | ICD-10-CM | POA: Diagnosis not present

## 2018-11-25 DIAGNOSIS — Z23 Encounter for immunization: Secondary | ICD-10-CM | POA: Diagnosis not present

## 2018-12-08 DIAGNOSIS — E1122 Type 2 diabetes mellitus with diabetic chronic kidney disease: Secondary | ICD-10-CM | POA: Diagnosis not present

## 2018-12-08 DIAGNOSIS — F329 Major depressive disorder, single episode, unspecified: Secondary | ICD-10-CM | POA: Diagnosis not present

## 2018-12-08 DIAGNOSIS — I120 Hypertensive chronic kidney disease with stage 5 chronic kidney disease or end stage renal disease: Secondary | ICD-10-CM | POA: Diagnosis not present

## 2018-12-08 DIAGNOSIS — E039 Hypothyroidism, unspecified: Secondary | ICD-10-CM | POA: Diagnosis not present

## 2018-12-08 DIAGNOSIS — N184 Chronic kidney disease, stage 4 (severe): Secondary | ICD-10-CM | POA: Diagnosis not present

## 2018-12-08 DIAGNOSIS — I2510 Atherosclerotic heart disease of native coronary artery without angina pectoris: Secondary | ICD-10-CM | POA: Diagnosis not present

## 2018-12-08 DIAGNOSIS — N183 Chronic kidney disease, stage 3 (moderate): Secondary | ICD-10-CM | POA: Diagnosis not present

## 2018-12-08 DIAGNOSIS — I129 Hypertensive chronic kidney disease with stage 1 through stage 4 chronic kidney disease, or unspecified chronic kidney disease: Secondary | ICD-10-CM | POA: Diagnosis not present

## 2018-12-08 DIAGNOSIS — I10 Essential (primary) hypertension: Secondary | ICD-10-CM | POA: Diagnosis not present

## 2018-12-08 DIAGNOSIS — E782 Mixed hyperlipidemia: Secondary | ICD-10-CM | POA: Diagnosis not present

## 2018-12-08 DIAGNOSIS — N185 Chronic kidney disease, stage 5: Secondary | ICD-10-CM | POA: Diagnosis not present

## 2018-12-13 ENCOUNTER — Other Ambulatory Visit: Payer: Self-pay

## 2018-12-13 ENCOUNTER — Telehealth: Payer: Self-pay

## 2018-12-13 DIAGNOSIS — E1122 Type 2 diabetes mellitus with diabetic chronic kidney disease: Secondary | ICD-10-CM

## 2018-12-13 MED ORDER — INSULIN NPH (HUMAN) (ISOPHANE) 100 UNIT/ML ~~LOC~~ SUSP: 45.0000 [IU] | Freq: Every day | SUBCUTANEOUS | 0 refills | Status: DC

## 2018-12-13 NOTE — Telephone Encounter (Signed)
Please refill x 1  

## 2018-12-13 NOTE — Telephone Encounter (Signed)
Do you want me to refill this now or wait until pt's appt tomorrow?

## 2018-12-13 NOTE — Telephone Encounter (Signed)
Pt is set up for an appt tomorrow am

## 2018-12-13 NOTE — Telephone Encounter (Signed)
LMTCB to schedule appointment °

## 2018-12-13 NOTE — Telephone Encounter (Signed)
insulin NPH Human (HUMULIN N) 100 UNIT/ML injection 14 mL 0 12/13/2018    Sig - Route: Inject 0.45 mLs (45 Units total) into the skin daily before breakfast. - Subcutaneous   Sent to pharmacy as: insulin NPH Human (HUMULIN N) 100 UNIT/ML injection   E-Prescribing Status: Receipt confirmed by pharmacy (12/13/2018 2:03 PM EDT)

## 2018-12-13 NOTE — Telephone Encounter (Signed)
Per Dr. Ellison, unable to refill Humulin N without an appt. Routing this message to the front desk for scheduling purposes.  

## 2018-12-14 ENCOUNTER — Ambulatory Visit (INDEPENDENT_AMBULATORY_CARE_PROVIDER_SITE_OTHER): Payer: Medicare Other | Admitting: Endocrinology

## 2018-12-14 ENCOUNTER — Encounter: Payer: Self-pay | Admitting: Endocrinology

## 2018-12-14 VITALS — BP 132/60 | HR 81 | Ht 63.0 in | Wt 196.4 lb

## 2018-12-14 DIAGNOSIS — E1122 Type 2 diabetes mellitus with diabetic chronic kidney disease: Secondary | ICD-10-CM

## 2018-12-14 DIAGNOSIS — N185 Chronic kidney disease, stage 5: Secondary | ICD-10-CM

## 2018-12-14 DIAGNOSIS — Z794 Long term (current) use of insulin: Secondary | ICD-10-CM | POA: Diagnosis not present

## 2018-12-14 LAB — TSH: TSH: 6.03 u[IU]/mL — ABNORMAL HIGH (ref 0.35–4.50)

## 2018-12-14 LAB — BASIC METABOLIC PANEL
BUN: 22 mg/dL (ref 6–23)
CO2: 30 mEq/L (ref 19–32)
Calcium: 8.6 mg/dL (ref 8.4–10.5)
Chloride: 99 mEq/L (ref 96–112)
Creatinine, Ser: 1.36 mg/dL — ABNORMAL HIGH (ref 0.40–1.20)
GFR: 46.47 mL/min — ABNORMAL LOW (ref >60.00)
Glucose, Bld: 191 mg/dL — ABNORMAL HIGH (ref 70–99)
Potassium: 4 mEq/L (ref 3.5–5.1)
Sodium: 140 mEq/L (ref 135–145)

## 2018-12-14 LAB — POCT GLYCOSYLATED HEMOGLOBIN (HGB A1C): Hemoglobin A1C: 6.7 % — AB (ref 4.0–5.6)

## 2018-12-14 LAB — T4, FREE: Free T4: 0.83 ng/dL (ref 0.60–1.60)

## 2018-12-14 MED ORDER — LEVOTHYROXINE SODIUM 112 MCG PO TABS: 112.0000 ug | ORAL_TABLET | Freq: Every day | ORAL | 5 refills | Status: DC

## 2018-12-14 NOTE — Patient Instructions (Addendum)
check your blood sugar twice a day.  vary the time of day when you check, between before the 3 meals, and at bedtime.  also check if you have symptoms of your blood sugar being too high or too low.  please keep a record of the readings and bring it to your next appointment here.  You can write it on any piece of paper.  please call us sooner if your blood sugar goes below 70, or if you have a lot of readings over 200.  Blood tests are requested for you today.  We'll let you know about the results.  On this type of insulin schedule, you should eat meals on a regular schedule.  If a meal is missed or significantly delayed, your blood sugar could go low.  Please come back for a follow-up appointment in 4 months.

## 2018-12-14 NOTE — Progress Notes (Signed)
Subjective:    Patient ID: Sandy Roy, female    DOB: 11-26-1950, 68 y.o.   MRN: [id number]  HPI Pt returns for f/u of diabetes mellitus: DM type: insulin-requiring type 2 Dx'ed: 7858 Complications: CAD and renal failure (transplant 2016).   Therapy: insulin since 2015.   GDM: never.   DKA: never.  Severe hypoglycemia: never.   Pancreatitis: never.    Other: she has requested a QD, inexpensive insulin regimen; she is chronically on low-dose prednisone; the pattern of her cbg's indicates she needs R and N QAM, but no insulin later in the day. She does not check cbg's. Interval history: pt states she feels well in general. She never misses the insulin.   pt had thyroidectomy for 2 small foci of papillary cancer (2008).  foci were not large enough to warrant RAI.  Past Medical History:  Diagnosis Date  . Chronic kidney disease    ESRD-  Hemo- M_W_F  . CONGESTIVE HEART FAILURE 12/14/2006  . CORONARY ARTERY DISEASE 12/14/2006  . Depression    not recently  . DIABETES MELLITUS, TYPE II 12/14/2006   Type 2  . HYPERLIPIDEMIA 12/14/2006  . HYPERTENSION 12/14/2006  . Hypoparathyroidism ([place]) 12/21/2006  . Hypopotassemia 01/13/2007  . HYPOTHYROIDISM, POSTSURGICAL 12/15/2006  . NEOPLASM, MALIGNANT, THYROID GLAND 12/15/2006   Stage 1 papillary adenocarcinoma, one very small focus each lobe (35m, 254m  6/09: CT neck: no evidence of recurrence  . OBSTRUCTIVE SLEEP APNEA 02/23/2007   does not use a cpap  . Orbital fracture (HCC)    left eye  . Shortness of breath dyspnea    with exertion    Past Surgical History:  Procedure Laterality Date  . BASCILIC VEIN TRANSPOSITION Right 02/07/2014   Procedure: FIRST STAGE BASILIC VEIN TRANSPOSITION;  Surgeon: BrConrad BurlingtonMD;  [facility] Service: [facility];  Laterality: Right;  . BASCILIC VEIN TRANSPOSITION Right 05/30/2014   Procedure: SECOND STAGE BASILIC VEIN TRANSPOSITION;  Surgeon: BrConrad BurlingtonMD;  [facility];   Laterality: Right;  . BREAST SURGERY Left    nipple leaking, had surgery to fix the leak  . CARDIAC CATHETERIZATION     ? 10 years ago  . CHOLECYSTECTOMY    . COLONOSCOPY    . ORBITAL FRACTURE SURGERY    . OVARY SURGERY    . THYROIDECTOMY  2008  . TONSILLECTOMY    . TUBAL LIGATION      Social History   Socioeconomic History  . Marital status: Widowed    Spouse name: Not on file  . Number of children: Not on file  . Years of education: Not on file  . Highest education level: Not on file  Occupational History  . Not on file  Social Needs  . Financial resource strain: Not on file  . Food insecurity    Worry: Not on file    Inability: Not on file  . Transportation needs    Medical: Not on file    Non-medical: Not on file  Tobacco Use  . Smoking status: Former Smoker    Types: Cigarettes  . Smokeless tobacco: Never Used  . Tobacco comment: "quit years ago"  Substance and Sexual Activity  . Alcohol use: No    Alcohol/week: 0.0 standard drinks  . Drug use: No  . Sexual activity: Not on file  Lifestyle  . Physical activity    Days per week: Not on file    Minutes per session: Not on  file  . Stress: Not on file  Relationships  . Social Herbalist on phone: Not on file    Gets together: Not on file    Attends religious service: Not on file    Active member of club or organization: Not on file    Attends meetings of clubs or organizations: Not on file    Relationship status: Not on file  . Intimate partner violence    Fear of current or ex partner: Not on file    Emotionally abused: Not on file    Physically abused: Not on file    Forced sexual activity: Not on file  Other Topics Concern  . Not on file  Social History Narrative  . Not on file    Current Outpatient Medications on File Prior to Visit  Medication Sig Dispense Refill  . amLODipine (NORVASC) 10 MG tablet Take 10 mg by mouth daily.     Marland Kitchen aspirin (BAYER LOW STRENGTH) 81 MG EC tablet Take  81 mg by mouth daily.      Marland Kitchen atorvastatin (LIPITOR) 10 MG tablet Take 10 mg by mouth daily.    Marland Kitchen CALCIUM PO Take by mouth.    . cloNIDine (CATAPRES) 0.2 MG tablet Take 0.1 mg by mouth 2 (two) times daily.     . fish oil-omega-3 fatty acids 1000 MG capsule Take 1 capsule by mouth daily.      Marland Kitchen glucose blood (ONETOUCH VERIO) test strip USE ONE STRIP TO CHECK GLUCOSE TWICE DAILY Dx code: E11.9 200 each 3  . HUMULIN R 100 UNIT/ML injection INJECT 35 UNITS INTO THE SKIN DAILY WITH BREAKFAST 10 mL 11  . insulin NPH Human (HUMULIN N) 100 UNIT/ML injection Inject 0.45 mLs (45 Units total) into the skin daily before breakfast. 14 mL 0  . Insulin Syringe-Needle U-100 (B-D INSULIN SYRINGE) 31G X 5/16" 0.3 ML MISC Use 1 time per day. 100 each 1  . labetalol (NORMODYNE) 100 MG tablet 2 in the morning 2 in the evening  2  . Lancets (ONETOUCH ULTRASOFT) lancets Use to check blood sugar 2 times per day 100 each 2  . Multiple Vitamins-Minerals (MULTIVITAMIN WITH MINERALS) tablet Take 1 tablet by mouth daily.    . mycophenolate (MYFORTIC) 180 MG EC tablet Take 180 mg by mouth 2 (two) times daily.    . nitroGLYCERIN (NITROSTAT) 0.4 MG SL tablet Place 1 tablet (0.4 mg total) under the tongue every 5 (five) minutes as needed. 25 tablet 5  . omeprazole (PRILOSEC) 20 MG capsule Take 20 mg by mouth daily.    . predniSONE (DELTASONE) 5 MG tablet Take 5 mg by mouth daily with breakfast.    . sertraline (ZOLOFT) 50 MG tablet Take 50 mg by mouth daily.    Marland Kitchen sulfamethoxazole-trimethoprim (BACTRIM,SEPTRA) 400-80 MG per tablet 1 tablet. On Monday Wednesday and Friday    . tacrolimus (PROGRAF) 1 MG capsule Take 5 mg by mouth. 6 tabs in the morning 6 tabs in the evening    . triazolam (HALCION) 0.25 MG tablet 2 tablets by mouth at bedtime     . zinc gluconate 50 MG tablet Take 50 mg by mouth daily.     No current facility-administered medications on file prior to visit.     No Known Allergies  Family History  Problem  Relation Age of Onset  . Heart failure Mother   . Diabetes Mother   . Cancer Father   . Cancer Other   .  COPD Other   . Hypertension Other   . Hyperlipidemia Other   . Stroke Other   . Heart disease Other   . Diabetes Other     BP 132/60 (BP Location: Left Arm, Patient Position: Sitting, Cuff Size: Large)   Pulse 81   Ht _0  (1.6 m)   Wt 196 lb 6.4 oz (89.1 kg)   SpO2 94%   BMI 34.79 kg/m    Review of Systems She denies hypoglycemia    Objective:   Physical Exam VITAL SIGNS:  See vs page GENERAL: no distress Neck: a healed scar is present.  I do not appreciate a nodule in the thyroid or elsewhere in the neck Pulses: dorsalis pedis intact bilat.   MSK: no deformity of the feet CV: 1+ bilat leg edema Skin:  no ulcer on the feet.  normal color and temp on the feet. Neuro: sensation is intact to touch on the feet  A1c=6.7%     Assessment & Plan:  Insulin-requiring type 2 DM, with CAD Renal failure: A1c might underestimate avg glucose.  Check fructosamine.  PTC: clinically stable.   Patient Instructions  check your blood sugar twice a day.  vary the time of day when you check, between before the 3 meals, and at bedtime.  also check if you have symptoms of your blood sugar being too high or too low.  please keep a record of the readings and bring it to your next appointment here.  You can write it on any piece of paper.  please call us sooner if your blood sugar goes below 70, or if you have a lot of readings over 200.  Blood tests are requested for you today.  We'll let you know about the results.  On this type of insulin schedule, you should eat meals on a regular schedule.  If a meal is missed or significantly delayed, your blood sugar could go low.  Please come back for a follow-up appointment in 4 months.

## 2018-12-16 LAB — FRUCTOSAMINE: Fructosamine: 264 umol/L (ref 205–285)

## 2018-12-20 ENCOUNTER — Other Ambulatory Visit: Payer: Self-pay

## 2018-12-20 DIAGNOSIS — E1122 Type 2 diabetes mellitus with diabetic chronic kidney disease: Secondary | ICD-10-CM

## 2018-12-20 DIAGNOSIS — Z794 Long term (current) use of insulin: Secondary | ICD-10-CM

## 2018-12-20 MED ORDER — INSULIN NPH (HUMAN) (ISOPHANE) 100 UNIT/ML ~~LOC~~ SUSP: 40.0000 [IU] | Freq: Every day | SUBCUTANEOUS | 0 refills | Status: DC

## 2019-01-05 DIAGNOSIS — Z940 Kidney transplant status: Secondary | ICD-10-CM | POA: Diagnosis not present

## 2019-01-10 ENCOUNTER — Other Ambulatory Visit: Payer: Self-pay | Admitting: Endocrinology

## 2019-01-10 DIAGNOSIS — E1122 Type 2 diabetes mellitus with diabetic chronic kidney disease: Secondary | ICD-10-CM

## 2019-01-10 DIAGNOSIS — N185 Chronic kidney disease, stage 5: Secondary | ICD-10-CM

## 2019-02-01 DIAGNOSIS — I10 Essential (primary) hypertension: Secondary | ICD-10-CM | POA: Diagnosis not present

## 2019-02-01 DIAGNOSIS — Z940 Kidney transplant status: Secondary | ICD-10-CM | POA: Diagnosis not present

## 2019-02-01 DIAGNOSIS — E119 Type 2 diabetes mellitus without complications: Secondary | ICD-10-CM | POA: Diagnosis not present

## 2019-02-01 DIAGNOSIS — R1900 Intra-abdominal and pelvic swelling, mass and lump, unspecified site: Secondary | ICD-10-CM | POA: Diagnosis not present

## 2019-02-08 ENCOUNTER — Other Ambulatory Visit: Payer: Self-pay | Admitting: Nephrology

## 2019-02-08 DIAGNOSIS — Z940 Kidney transplant status: Secondary | ICD-10-CM

## 2019-02-17 ENCOUNTER — Ambulatory Visit
Admission: RE | Admit: 2019-02-17 | Discharge: 2019-02-17 | Disposition: A | Discharge status: Home / Self Care | Payer: Medicare Other | Source: Ambulatory Visit | Attending: Nephrology | Admitting: Nephrology

## 2019-02-17 DIAGNOSIS — N281 Cyst of kidney, acquired: Secondary | ICD-10-CM | POA: Diagnosis not present

## 2019-02-17 DIAGNOSIS — Z940 Kidney transplant status: Secondary | ICD-10-CM

## 2019-02-17 DIAGNOSIS — R140 Abdominal distension (gaseous): Secondary | ICD-10-CM | POA: Diagnosis not present

## 2019-04-07 ENCOUNTER — Other Ambulatory Visit: Payer: Self-pay

## 2019-04-07 DIAGNOSIS — M7989 Other specified soft tissue disorders: Secondary | ICD-10-CM | POA: Diagnosis not present

## 2019-04-07 DIAGNOSIS — E119 Type 2 diabetes mellitus without complications: Secondary | ICD-10-CM | POA: Diagnosis not present

## 2019-04-07 DIAGNOSIS — Z940 Kidney transplant status: Secondary | ICD-10-CM | POA: Diagnosis not present

## 2019-04-11 ENCOUNTER — Ambulatory Visit: Payer: Medicare Other | Admitting: Endocrinology

## 2019-04-22 ENCOUNTER — Other Ambulatory Visit: Payer: Self-pay

## 2019-04-26 ENCOUNTER — Encounter: Payer: Self-pay | Admitting: Endocrinology

## 2019-04-26 ENCOUNTER — Telehealth: Payer: Self-pay

## 2019-04-26 ENCOUNTER — Ambulatory Visit (INDEPENDENT_AMBULATORY_CARE_PROVIDER_SITE_OTHER): Payer: Medicare Other | Admitting: Endocrinology

## 2019-04-26 ENCOUNTER — Other Ambulatory Visit: Payer: Self-pay

## 2019-04-26 VITALS — BP 124/80 | HR 59 | Ht 63.0 in | Wt 200.0 lb

## 2019-04-26 DIAGNOSIS — N185 Chronic kidney disease, stage 5: Secondary | ICD-10-CM

## 2019-04-26 DIAGNOSIS — E1122 Type 2 diabetes mellitus with diabetic chronic kidney disease: Secondary | ICD-10-CM

## 2019-04-26 DIAGNOSIS — Z794 Long term (current) use of insulin: Secondary | ICD-10-CM

## 2019-04-26 LAB — POCT GLYCOSYLATED HEMOGLOBIN (HGB A1C): Hemoglobin A1C: 7.1 % — AB (ref 4.0–5.6)

## 2019-04-26 LAB — TSH: TSH: 4.2 u[IU]/mL (ref 0.35–4.50)

## 2019-04-26 MED ORDER — INSULIN REGULAR HUMAN 100 UNIT/ML IJ SOLN: 30.0000 [IU] | Freq: Every day | INTRAMUSCULAR | 11 refills | Status: DC

## 2019-04-26 MED ORDER — INSULIN NPH (HUMAN) (ISOPHANE) 100 UNIT/ML ~~LOC~~ SUSP: 35.0000 [IU] | SUBCUTANEOUS | 0 refills | Status: DC

## 2019-04-26 NOTE — Progress Notes (Signed)
Subjective:    Patient ID: Sandy Roy, female    DOB: 07-Dec-1950, 69 y.o.   MRN: [id number]  HPI Pt returns for f/u of diabetes mellitus: DM type: insulin-requiring type 2 Dx'ed: 4098 Complications: CAD and renal failure (transplant 2016).   Therapy: insulin since 2015.   GDM: never.   DKA: never.  Severe hypoglycemia: never.   Pancreatitis: never.   SDOH:  She no longer checks cbg's.   Other: she has requested a QD, inexpensive insulin regimen; she is chronically on low-dose prednisone; the pattern of her cbg's indicates she needs R and N QAM, but no insulin later in the day.  fructosamine has confirmed a1c Interval history: pt states she feels well in general, except for sweating in the afternoon.  this happens twice per month.  sxs are relieved with eating or drinking.  She never misses the insulin.  She takes 35 units of each insulin, each morning.   pt had thyroidectomy for 2 small foci of papillary cancer (2008).  foci were not large enough to warrant RAI.  Past Medical History:  Diagnosis Date  . Chronic kidney disease    ESRD-  Hemo- M_W_F  . CONGESTIVE HEART FAILURE 12/14/2006  . CORONARY ARTERY DISEASE 12/14/2006  . Depression    not recently  . DIABETES MELLITUS, TYPE II 12/14/2006   Type 2  . HYPERLIPIDEMIA 12/14/2006  . HYPERTENSION 12/14/2006  . Hypoparathyroidism ([place]) 12/21/2006  . Hypopotassemia 01/13/2007  . HYPOTHYROIDISM, POSTSURGICAL 12/15/2006  . NEOPLASM, MALIGNANT, THYROID GLAND 12/15/2006   Stage 1 papillary adenocarcinoma, one very small focus each lobe (13mm, 80mm)  6/09: CT neck: no evidence of recurrence  . OBSTRUCTIVE SLEEP APNEA 02/23/2007   does not use a cpap  . Orbital fracture ([facility])    left eye  . Shortness of breath dyspnea    with exertion    Past Surgical History:  Procedure Laterality Date  . BASCILIC VEIN TRANSPOSITION Right 02/07/2014   Procedure: FIRST STAGE BASILIC VEIN TRANSPOSITION;  Surgeon: Conrad Sherburn, MD;  Location: [facility];   Service: Vascular;  Laterality: Right;  . BASCILIC VEIN TRANSPOSITION Right 05/30/2014   Procedure: SECOND STAGE BASILIC VEIN TRANSPOSITION;  Surgeon: Conrad Framingham, MD;  Location: [place];  Service: Vascular;  Laterality: Right;  . BREAST SURGERY Left    nipple leaking, had surgery to fix the leak  . CARDIAC CATHETERIZATION     ? 10 years ago  . CHOLECYSTECTOMY    . COLONOSCOPY    . ORBITAL FRACTURE SURGERY    . OVARY SURGERY    . THYROIDECTOMY  2008  . TONSILLECTOMY    . TUBAL LIGATION      Social History   Socioeconomic History  . Marital status: Widowed    Spouse name: Not on file  . Number of children: Not on file  . Years of education: Not on file  . Highest education level: Not on file  Occupational History  . Not on file  Tobacco Use  . Smoking status: Former Smoker    Types: Cigarettes  . Smokeless tobacco: Never Used  . Tobacco comment: "quit years ago"  Substance and Sexual Activity  . Alcohol use: No    Alcohol/week: 0.0 standard drinks  . Drug use: No  . Sexual activity: Not on file  Other Topics Concern  . Not on file  Social History Narrative  . Not on file   Social Determinants of Health   Financial Resource Strain:   .  Difficulty of Paying Living Expenses: Not on file  Food Insecurity:   . Worried About Charity fundraiser in the Last Year: Not on file  . Ran Out of Food in the Last Year: Not on file  Transportation Needs:   . Lack of Transportation (Medical): Not on file  . Lack of Transportation (Non-Medical): Not on file  Physical Activity:   . Days of Exercise per Week: Not on file  . Minutes of Exercise per Session: Not on file  Stress:   . Feeling of Stress : Not on file  Social Connections:   . Frequency of Communication with Friends and Family: Not on file  . Frequency of Social Gatherings with Friends and Family: Not on file  . Attends Religious Services: Not on file  . Active Member of Clubs or Organizations: Not on file  . Attends  Archivist Meetings: Not on file  . Marital Status: Not on file  Intimate Partner Violence:   . Fear of Current or Ex-Partner: Not on file  . Emotionally Abused: Not on file  . Physically Abused: Not on file  . Sexually Abused: Not on file    Current Outpatient Medications on File Prior to Visit  Medication Sig Dispense Refill  . amLODipine (NORVASC) 10 MG tablet Take 10 mg by mouth daily.     Marland Kitchen aspirin (BAYER LOW STRENGTH) 81 MG EC tablet Take 81 mg by mouth daily.      Marland Kitchen atorvastatin (LIPITOR) 10 MG tablet Take 10 mg by mouth daily.    Marland Kitchen CALCIUM PO Take by mouth.    . cloNIDine (CATAPRES) 0.2 MG tablet Take 0.1 mg by mouth 2 (two) times daily.     . fish oil-omega-3 fatty acids 1000 MG capsule Take 1 capsule by mouth daily.      Marland Kitchen glucose blood (ONETOUCH VERIO) test strip USE ONE STRIP TO CHECK GLUCOSE TWICE DAILY Dx code: E11.9 200 each 3  . Insulin Syringe-Needle U-100 (B-D INSULIN SYRINGE) 31G X 5/16" 0.3 ML MISC Use 1 time per day. 100 each 1  . labetalol (NORMODYNE) 100 MG tablet 2 in the morning 2 in the evening  2  . Lancets (ONETOUCH ULTRASOFT) lancets Use to check blood sugar 2 times per day 100 each 2  . levothyroxine (SYNTHROID) 112 MCG tablet Take 1 tablet (112 mcg total) by mouth daily before breakfast. 30 tablet 5  . Multiple Vitamins-Minerals (MULTIVITAMIN WITH MINERALS) tablet Take 1 tablet by mouth daily.    . mycophenolate (MYFORTIC) 180 MG EC tablet Take 180 mg by mouth 2 (two) times daily.    . nitroGLYCERIN (NITROSTAT) 0.4 MG SL tablet Place 1 tablet (0.4 mg total) under the tongue every 5 (five) minutes as needed. 25 tablet 5  . omeprazole (PRILOSEC) 20 MG capsule Take 20 mg by mouth daily.    . predniSONE (DELTASONE) 5 MG tablet Take 5 mg by mouth daily with breakfast.    . sulfamethoxazole-trimethoprim (BACTRIM,SEPTRA) 400-80 MG per tablet 1 tablet. On Monday Wednesday and Friday    . tacrolimus (PROGRAF) 1 MG capsule Take 5 mg by mouth. 6 tabs in the  morning 6 tabs in the evening    . triazolam (HALCION) 0.25 MG tablet 2 tablets by mouth at bedtime     . zinc gluconate 50 MG tablet Take 50 mg by mouth daily.     No current facility-administered medications on file prior to visit.    No Known Allergies  Family History  Problem Relation Age of Onset  . Heart failure Mother   . Diabetes Mother   . Cancer Father   . Cancer Other   . COPD Other   . Hypertension Other   . Hyperlipidemia Other   . Stroke Other   . Heart disease Other   . Diabetes Other     BP 124/80 (BP Location: Left Arm, Patient Position: Sitting, Cuff Size: Large)   Pulse (!) 59   Ht 5\' 3"  (1.6 m)   Wt 200 lb (90.7 kg)   SpO2 95%   BMI 35.43 kg/m    Review of Systems Denies LOC    Objective:   Physical Exam VITAL SIGNS:  See vs page GENERAL: no distress Pulses: dorsalis pedis intact bilat.   MSK: no deformity of the feet CV: no leg edema Skin:  no ulcer on the feet.  normal color and temp on the feet.  Neuro: sensation is intact to touch on the feet.  Ext: there is bilateral onychomycosis of the toenails.    Lab Results  Component Value Date   HGBA1C 7.1 (A) 04/26/2019   Lab Results  Component Value Date   CREATININE 1.36 (H) 12/14/2018   BUN 22 12/14/2018   NA 140 12/14/2018   K 4.0 12/14/2018   CL 99 12/14/2018   CO2 30 12/14/2018   Lab Results  Component Value Date   TSH 6.03 (H) 12/14/2018        Assessment & Plan:  Hypothyroidism: recheck today Insulin-requiring type 2 DM: overcontrolled, given this regimen, which does match insulin to her changing needs throughout the day Hypoglycemia: this limits aggressiveness of glycemic control Renal insuff: in this setting, she does not need a PM insulin dose.  Patient Instructions  check your blood sugar twice a day.  vary the time of day when you check, between before the 3 meals, and at bedtime.  also check if you have symptoms of your blood sugar being too high or too low.   please keep a record of the readings and bring it to your next appointment here.  You can write it on any piece of paper.  please call us sooner if your blood sugar goes below 70, or if you have a lot of readings over 200.  Please reduce the regular insulin to 30 units with breakfast, and Please continue the same NPH On this type of insulin schedule, you should eat meals on a regular schedule.  If a meal is missed or significantly delayed, your blood sugar could go low.  Blood tests are requested for you today.  We'll let you know about the results.  Please come back for a follow-up appointment in 4 months.

## 2019-04-26 NOTE — Telephone Encounter (Signed)
-----   Message from Renato Shin, MD sent at 04/26/2019  1:09 PM EST ----- please contact patient: Normal.  Please continue the same medication. I'll see you next time.

## 2019-04-26 NOTE — Patient Instructions (Addendum)
check your blood sugar twice a day.  vary the time of day when you check, between before the 3 meals, and at bedtime.  also check if you have symptoms of your blood sugar being too high or too low.  please keep a record of the readings and bring it to your next appointment here.  You can write it on any piece of paper.  please call us sooner if your blood sugar goes below 70, or if you have a lot of readings over 200.  Please reduce the regular insulin to 30 units with breakfast, and Please continue the same NPH On this type of insulin schedule, you should eat meals on a regular schedule.  If a meal is missed or significantly delayed, your blood sugar could go low.  Blood tests are requested for you today.  We'll let you know about the results.  Please come back for a follow-up appointment in 4 months.

## 2019-04-26 NOTE — Telephone Encounter (Signed)
Lab results reviewed by Dr. Ellison. Letter has been mailed. For future reference, letter can be found in Epic. 

## 2019-05-05 DIAGNOSIS — E119 Type 2 diabetes mellitus without complications: Secondary | ICD-10-CM | POA: Diagnosis not present

## 2019-05-05 DIAGNOSIS — I10 Essential (primary) hypertension: Secondary | ICD-10-CM | POA: Diagnosis not present

## 2019-05-05 DIAGNOSIS — R1900 Intra-abdominal and pelvic swelling, mass and lump, unspecified site: Secondary | ICD-10-CM | POA: Diagnosis not present

## 2019-05-05 DIAGNOSIS — Z940 Kidney transplant status: Secondary | ICD-10-CM | POA: Diagnosis not present

## 2019-05-10 DIAGNOSIS — F329 Major depressive disorder, single episode, unspecified: Secondary | ICD-10-CM | POA: Diagnosis not present

## 2019-05-10 DIAGNOSIS — N184 Chronic kidney disease, stage 4 (severe): Secondary | ICD-10-CM | POA: Diagnosis not present

## 2019-05-10 DIAGNOSIS — I2510 Atherosclerotic heart disease of native coronary artery without angina pectoris: Secondary | ICD-10-CM | POA: Diagnosis not present

## 2019-05-10 DIAGNOSIS — I120 Hypertensive chronic kidney disease with stage 5 chronic kidney disease or end stage renal disease: Secondary | ICD-10-CM | POA: Diagnosis not present

## 2019-05-10 DIAGNOSIS — N185 Chronic kidney disease, stage 5: Secondary | ICD-10-CM | POA: Diagnosis not present

## 2019-05-10 DIAGNOSIS — F3341 Major depressive disorder, recurrent, in partial remission: Secondary | ICD-10-CM | POA: Diagnosis not present

## 2019-05-10 DIAGNOSIS — G4700 Insomnia, unspecified: Secondary | ICD-10-CM | POA: Diagnosis not present

## 2019-05-10 DIAGNOSIS — E039 Hypothyroidism, unspecified: Secondary | ICD-10-CM | POA: Diagnosis not present

## 2019-05-10 DIAGNOSIS — E1122 Type 2 diabetes mellitus with diabetic chronic kidney disease: Secondary | ICD-10-CM | POA: Diagnosis not present

## 2019-05-10 DIAGNOSIS — I129 Hypertensive chronic kidney disease with stage 1 through stage 4 chronic kidney disease, or unspecified chronic kidney disease: Secondary | ICD-10-CM | POA: Diagnosis not present

## 2019-05-10 DIAGNOSIS — I10 Essential (primary) hypertension: Secondary | ICD-10-CM | POA: Diagnosis not present

## 2019-05-30 DIAGNOSIS — H6122 Impacted cerumen, left ear: Secondary | ICD-10-CM | POA: Diagnosis not present

## 2019-06-02 ENCOUNTER — Other Ambulatory Visit: Payer: Self-pay

## 2019-06-02 DIAGNOSIS — E1122 Type 2 diabetes mellitus with diabetic chronic kidney disease: Secondary | ICD-10-CM

## 2019-06-02 DIAGNOSIS — E890 Postprocedural hypothyroidism: Secondary | ICD-10-CM

## 2019-06-02 DIAGNOSIS — Z794 Long term (current) use of insulin: Secondary | ICD-10-CM

## 2019-06-02 MED ORDER — INSULIN NPH (HUMAN) (ISOPHANE) 100 UNIT/ML ~~LOC~~ SUSP: 35.0000 [IU] | SUBCUTANEOUS | 0 refills | Status: DC

## 2019-06-02 MED ORDER — LEVOTHYROXINE SODIUM 112 MCG PO TABS: 112.0000 ug | ORAL_TABLET | Freq: Every day | ORAL | 0 refills | Status: DC

## 2019-06-02 MED ORDER — INSULIN REGULAR HUMAN 100 UNIT/ML IJ SOLN: 30.0000 [IU] | Freq: Every day | INTRAMUSCULAR | 0 refills | Status: DC

## 2019-07-04 DIAGNOSIS — Z940 Kidney transplant status: Secondary | ICD-10-CM | POA: Diagnosis not present

## 2019-07-12 DIAGNOSIS — N907 Vulvar cyst: Secondary | ICD-10-CM | POA: Diagnosis not present

## 2019-08-04 ENCOUNTER — Other Ambulatory Visit: Payer: Self-pay

## 2019-08-09 ENCOUNTER — Ambulatory Visit (INDEPENDENT_AMBULATORY_CARE_PROVIDER_SITE_OTHER): Payer: Medicare Other | Admitting: Endocrinology

## 2019-08-09 ENCOUNTER — Encounter: Payer: Self-pay | Admitting: Endocrinology

## 2019-08-09 ENCOUNTER — Other Ambulatory Visit: Payer: Self-pay

## 2019-08-09 VITALS — BP 104/58 | HR 62 | Ht 63.0 in | Wt 203.0 lb

## 2019-08-09 DIAGNOSIS — Z794 Long term (current) use of insulin: Secondary | ICD-10-CM

## 2019-08-09 DIAGNOSIS — E1122 Type 2 diabetes mellitus with diabetic chronic kidney disease: Secondary | ICD-10-CM | POA: Diagnosis not present

## 2019-08-09 DIAGNOSIS — C73 Malignant neoplasm of thyroid gland: Secondary | ICD-10-CM | POA: Diagnosis not present

## 2019-08-09 DIAGNOSIS — N185 Chronic kidney disease, stage 5: Secondary | ICD-10-CM

## 2019-08-09 LAB — POCT GLYCOSYLATED HEMOGLOBIN (HGB A1C): Hemoglobin A1C: 7.7 % — AB (ref 4.0–5.6)

## 2019-08-09 MED ORDER — INSULIN REGULAR HUMAN 100 UNIT/ML IJ SOLN: 25.0000 [IU] | Freq: Every day | INTRAMUSCULAR | 0 refills | Status: DC

## 2019-08-09 MED ORDER — INSULIN NPH (HUMAN) (ISOPHANE) 100 UNIT/ML ~~LOC~~ SUSP: 30.0000 [IU] | SUBCUTANEOUS | 0 refills | Status: DC

## 2019-08-09 NOTE — Progress Notes (Signed)
Subjective:    Patient ID: Sandy Roy, female    DOB: 1950/06/29, 69 y.o.   MRN: [id number]  HPI Pt returns for f/u of diabetes mellitus: DM type: insulin-requiring type 2 Dx'ed: 0814 Complications: CAD and ESRD (transplant 2016).   Therapy: insulin since 2015.   GDM: never.   DKA: never.  Severe hypoglycemia: never.   Pancreatitis: never.   SDOH:  She no longer checks cbg's.   Other: she has requested a QD, inexpensive insulin regimen; she is chronically on low-dose prednisone; the pattern of her cbg's indicates she needs R and N QAM, but no insulin later in the day.  fructosamine has confirmed a1c Interval history: pt states she feels well in general.  She takes NPH, 30 units, and Reg 25 units, both QAM.  she does not check cbg.   pt had thyroidectomy for 2 small foci of papillary cancer (2008).  foci were not large enough to warrant RAI.  Past Medical History:  Diagnosis Date  . Chronic kidney disease    ESRD-  Hemo- M_W_F  . CONGESTIVE HEART FAILURE 12/14/2006  . CORONARY ARTERY DISEASE 12/14/2006  . Depression    not recently  . DIABETES MELLITUS, TYPE II 12/14/2006   Type 2  . HYPERLIPIDEMIA 12/14/2006  . HYPERTENSION 12/14/2006  . Hypoparathyroidism ([place]) 12/21/2006  . Hypopotassemia 01/13/2007  . HYPOTHYROIDISM, POSTSURGICAL 12/15/2006  . NEOPLASM, MALIGNANT, THYROID GLAND 12/15/2006   Stage 1 papillary adenocarcinoma, one very small focus each lobe (37m, 229m  6/09: CT neck: no evidence of recurrence  . OBSTRUCTIVE SLEEP APNEA 02/23/2007   does not use a cpap  . Orbital fracture (HCC)    left eye  . Shortness of breath dyspnea    with exertion    Past Surgical History:  Procedure Laterality Date  . BASCILIC VEIN TRANSPOSITION Right 02/07/2014   Procedure: FIRST STAGE BASILIC VEIN TRANSPOSITION;  Surgeon: BrConrad BurlingtonMD;  [facility];  Laterality: Right;  . BASCILIC VEIN TRANSPOSITION Right 05/30/2014   Procedure: SECOND STAGE BASILIC  VEIN TRANSPOSITION;  Surgeon: BrConrad BurlingtonMD;  [facility];  Laterality: Right;  . BREAST SURGERY Left    nipple leaking, had surgery to fix the leak  . CARDIAC CATHETERIZATION     ? 10 years ago  . CHOLECYSTECTOMY    . COLONOSCOPY    . ORBITAL FRACTURE SURGERY    . OVARY SURGERY    . THYROIDECTOMY  2008  . TONSILLECTOMY    . TUBAL LIGATION      Social History   Socioeconomic History  . Marital status: Widowed    Spouse name: Not on file  . Number of children: Not on file  . Years of education: Not on file  . Highest education level: Not on file  Occupational History  . Not on file  Tobacco Use  . Smoking status: Former Smoker    Types: Cigarettes  . Smokeless tobacco: Never Used  . Tobacco comment: "quit years ago"  Substance and Sexual Activity  . Alcohol use: No    Alcohol/week: 0.0 standard drinks  . Drug use: No  . Sexual activity: Not on file  Other Topics Concern  . Not on file  Social History Narrative  . Not on file   Social Determinants of Health   Financial Resource Strain:   . Difficulty of Paying Living Expenses:   Food Insecurity:   . Worried About RuCharity fundraiser  in the Last Year:   . [place] in the Last Year:   Transportation Needs:   . Film/video editor (Medical):   Marland Kitchen Lack of Transportation (Non-Medical):   Physical Activity:   . Days of Exercise per Week:   . Minutes of Exercise per Session:   Stress:   . Feeling of Stress :   Social Connections:   . Frequency of Communication with Friends and Family:   . Frequency of Social Gatherings with Friends and Family:   . Attends Religious Services:   . Active Member of Clubs or Organizations:   . Attends Archivist Meetings:   Marland Kitchen Marital Status:   Intimate Partner Violence:   . Fear of Current or Ex-Partner:   . Emotionally Abused:   Marland Kitchen Physically Abused:   . Sexually Abused:     Current Outpatient Medications on File Prior to Visit    Medication Sig Dispense Refill  . amLODipine (NORVASC) 10 MG tablet Take 10 mg by mouth daily.     Marland Kitchen aspirin (BAYER LOW STRENGTH) 81 MG EC tablet Take 81 mg by mouth daily.      Marland Kitchen atorvastatin (LIPITOR) 10 MG tablet Take 10 mg by mouth daily.    Marland Kitchen CALCIUM PO Take by mouth.    . cloNIDine (CATAPRES) 0.2 MG tablet Take 0.1 mg by mouth 2 (two) times daily.     . fish oil-omega-3 fatty acids 1000 MG capsule Take 1 capsule by mouth daily.      Marland Kitchen glucose blood (ONETOUCH VERIO) test strip USE ONE STRIP TO CHECK GLUCOSE TWICE DAILY Dx code: E11.9 200 each 3  . Insulin Syringe-Needle U-100 (B-D INSULIN SYRINGE) 31G X 5/16" 0.3 ML MISC Use 1 time per day. 100 each 1  . labetalol (NORMODYNE) 100 MG tablet 2 in the morning 2 in the evening  2  . Lancets (ONETOUCH ULTRASOFT) lancets Use to check blood sugar 2 times per day 100 each 2  . levothyroxine (SYNTHROID) 112 MCG tablet Take 1 tablet (112 mcg total) by mouth daily before breakfast. 90 tablet 0  . Multiple Vitamins-Minerals (MULTIVITAMIN WITH MINERALS) tablet Take 1 tablet by mouth daily.    . mycophenolate (MYFORTIC) 180 MG EC tablet Take 180 mg by mouth 2 (two) times daily.    . nitroGLYCERIN (NITROSTAT) 0.4 MG SL tablet Place 1 tablet (0.4 mg total) under the tongue every 5 (five) minutes as needed. 25 tablet 5  . omeprazole (PRILOSEC) 20 MG capsule Take 20 mg by mouth daily.    . predniSONE (DELTASONE) 5 MG tablet Take 5 mg by mouth daily with breakfast.    . sulfamethoxazole-trimethoprim (BACTRIM,SEPTRA) 400-80 MG per tablet 1 tablet. On Monday Wednesday and Friday    . tacrolimus (PROGRAF) 1 MG capsule Take 5 mg by mouth. 6 tabs in the morning 6 tabs in the evening    . triazolam (HALCION) 0.25 MG tablet 2 tablets by mouth at bedtime     . zinc gluconate 50 MG tablet Take 50 mg by mouth daily.     No current facility-administered medications on file prior to visit.    No Known Allergies  Family History  Problem Relation Age of Onset  .  Heart failure Mother   . Diabetes Mother   . Cancer Father   . Cancer Other   . COPD Other   . Hypertension Other   . Hyperlipidemia Other   . Stroke Other   . Heart disease Other   .  Diabetes Other     BP (!) 104/58   Pulse 62   Ht 5' 3"  (1.6 m)   Wt 203 lb (92.1 kg)   SpO2 94%   BMI 35.96 kg/m    Review of Systems She denies hypoglycemia    Objective:   Physical Exam VITAL SIGNS:  See vs page GENERAL: no distress Pulses: dorsalis pedis intact bilat.   MSK: no deformity of the feet CV: 2+ left, and 1+ right leg edema Skin:  no ulcer on the feet.  normal color and temp on the feet. Neuro: sensation is intact to touch on the feet.    Lab Results  Component Value Date   HGBA1C 7.7 (A) 08/09/2019   Lab Results  Component Value Date   CREATININE 1.36 (H) 12/14/2018   BUN 22 12/14/2018   NA 140 12/14/2018   K 4.0 12/14/2018   CL 99 12/14/2018   CO2 30 12/14/2018   Lab Results  Component Value Date   TSH 4.20 04/26/2019        Assessment & Plan:  PTC: we discussed.  She requests f/u US Insulin-requiring type 2 DM.  Noncompliance with cbg recording: This limits rx options.   Patient Instructions  check your blood sugar twice a day.  vary the time of day when you check, between before the 3 meals, and at bedtime.  also check if you have symptoms of your blood sugar being too high or too low.  please keep a record of the readings and bring it to your next appointment here.  You can write it on any piece of paper.  please call us sooner if your blood sugar goes below 70, or if you have a lot of readings over 200.  Please continue the same insulins.   On this type of insulin schedule, you should eat meals on a regular schedule.  If a meal is missed or significantly delayed, your blood sugar could go low.  Let's recheck the ultrasound.  you will receive a phone call, about a day and time for an appointment. Please come back for a follow-up appointment in 4 months.

## 2019-08-09 NOTE — Patient Instructions (Addendum)
check your blood sugar twice a day.  vary the time of day when you check, between before the 3 meals, and at bedtime.  also check if you have symptoms of your blood sugar being too high or too low.  please keep a record of the readings and bring it to your next appointment here.  You can write it on any piece of paper.  please call us sooner if your blood sugar goes below 70, or if you have a lot of readings over 200.  Please continue the same insulins.   On this type of insulin schedule, you should eat meals on a regular schedule.  If a meal is missed or significantly delayed, your blood sugar could go low.  Let's recheck the ultrasound.  you will receive a phone call, about a day and time for an appointment. Please come back for a follow-up appointment in 4 months.

## 2019-08-26 ENCOUNTER — Ambulatory Visit
Admission: RE | Admit: 2019-08-26 | Discharge: 2019-08-26 | Disposition: A | Discharge status: Home / Self Care | Payer: Medicare Other | Source: Ambulatory Visit | Attending: Endocrinology | Admitting: Endocrinology

## 2019-08-26 DIAGNOSIS — C73 Malignant neoplasm of thyroid gland: Secondary | ICD-10-CM

## 2019-08-29 ENCOUNTER — Telehealth: Payer: Self-pay

## 2019-08-29 NOTE — Telephone Encounter (Signed)
-----   Message from Renato Shin, MD sent at 08/28/2019  4:26 PM EDT ----- please contact patient: No cancer is seen--good. I'll see you next time.

## 2019-08-29 NOTE — Telephone Encounter (Signed)
RESULTS  Results were reviewed by Dr. Ellison. A letter has been mailed to pt home address. For future reference, letter can be found in Epic. 

## 2019-09-14 ENCOUNTER — Other Ambulatory Visit: Payer: Self-pay

## 2019-09-14 ENCOUNTER — Other Ambulatory Visit: Payer: Self-pay | Admitting: Endocrinology

## 2019-09-14 ENCOUNTER — Telehealth: Payer: Self-pay | Admitting: Endocrinology

## 2019-09-14 DIAGNOSIS — N185 Chronic kidney disease, stage 5: Secondary | ICD-10-CM

## 2019-09-14 DIAGNOSIS — Z794 Long term (current) use of insulin: Secondary | ICD-10-CM

## 2019-09-14 DIAGNOSIS — E890 Postprocedural hypothyroidism: Secondary | ICD-10-CM

## 2019-09-14 MED ORDER — INSULIN NPH (HUMAN) (ISOPHANE) 100 UNIT/ML ~~LOC~~ SUSP: 30.0000 [IU] | SUBCUTANEOUS | 0 refills | Status: DC

## 2019-09-14 NOTE — Telephone Encounter (Signed)
Pharmacy called stating they got the rx for humulin R but they needed the humulin N, not R.   [facility] - [address] [address]  Phone:  [phone] Fax:  [phone]

## 2019-09-15 DIAGNOSIS — E119 Type 2 diabetes mellitus without complications: Secondary | ICD-10-CM | POA: Diagnosis not present

## 2019-09-15 DIAGNOSIS — Z9861 Coronary angioplasty status: Secondary | ICD-10-CM | POA: Diagnosis not present

## 2019-09-15 DIAGNOSIS — I2510 Atherosclerotic heart disease of native coronary artery without angina pectoris: Secondary | ICD-10-CM | POA: Diagnosis not present

## 2019-09-15 DIAGNOSIS — Z940 Kidney transplant status: Secondary | ICD-10-CM | POA: Diagnosis not present

## 2019-09-15 DIAGNOSIS — Z87891 Personal history of nicotine dependence: Secondary | ICD-10-CM | POA: Diagnosis not present

## 2019-09-15 DIAGNOSIS — E039 Hypothyroidism, unspecified: Secondary | ICD-10-CM | POA: Diagnosis not present

## 2019-09-15 DIAGNOSIS — Z6837 Body mass index (BMI) 37.0-37.9, adult: Secondary | ICD-10-CM | POA: Diagnosis not present

## 2019-09-15 DIAGNOSIS — I10 Essential (primary) hypertension: Secondary | ICD-10-CM | POA: Diagnosis not present

## 2019-09-15 DIAGNOSIS — Z792 Long term (current) use of antibiotics: Secondary | ICD-10-CM | POA: Diagnosis not present

## 2019-09-15 DIAGNOSIS — Z4822 Encounter for aftercare following kidney transplant: Secondary | ICD-10-CM | POA: Diagnosis not present

## 2019-09-15 DIAGNOSIS — E785 Hyperlipidemia, unspecified: Secondary | ICD-10-CM | POA: Diagnosis not present

## 2019-09-15 DIAGNOSIS — R635 Abnormal weight gain: Secondary | ICD-10-CM | POA: Diagnosis not present

## 2019-09-15 DIAGNOSIS — Z7952 Long term (current) use of systemic steroids: Secondary | ICD-10-CM | POA: Diagnosis not present

## 2019-09-15 DIAGNOSIS — Z79899 Other long term (current) drug therapy: Secondary | ICD-10-CM | POA: Diagnosis not present

## 2019-09-15 DIAGNOSIS — Z794 Long term (current) use of insulin: Secondary | ICD-10-CM | POA: Diagnosis not present

## 2019-09-15 DIAGNOSIS — R600 Localized edema: Secondary | ICD-10-CM | POA: Diagnosis not present

## 2019-09-22 DIAGNOSIS — M7989 Other specified soft tissue disorders: Secondary | ICD-10-CM | POA: Diagnosis not present

## 2019-09-22 DIAGNOSIS — R600 Localized edema: Secondary | ICD-10-CM | POA: Diagnosis not present

## 2019-09-22 DIAGNOSIS — Z940 Kidney transplant status: Secondary | ICD-10-CM | POA: Diagnosis not present

## 2019-09-29 DIAGNOSIS — M7918 Myalgia, other site: Secondary | ICD-10-CM | POA: Diagnosis not present

## 2019-10-02 DIAGNOSIS — J982 Interstitial emphysema: Secondary | ICD-10-CM | POA: Diagnosis not present

## 2019-10-02 DIAGNOSIS — Z6838 Body mass index (BMI) 38.0-38.9, adult: Secondary | ICD-10-CM | POA: Diagnosis not present

## 2019-10-02 DIAGNOSIS — J439 Emphysema, unspecified: Secondary | ICD-10-CM | POA: Diagnosis not present

## 2019-10-02 DIAGNOSIS — J9601 Acute respiratory failure with hypoxia: Secondary | ICD-10-CM | POA: Diagnosis not present

## 2019-10-02 DIAGNOSIS — G4700 Insomnia, unspecified: Secondary | ICD-10-CM | POA: Diagnosis present

## 2019-10-02 DIAGNOSIS — D72829 Elevated white blood cell count, unspecified: Secondary | ICD-10-CM | POA: Diagnosis not present

## 2019-10-02 DIAGNOSIS — R918 Other nonspecific abnormal finding of lung field: Secondary | ICD-10-CM | POA: Diagnosis not present

## 2019-10-02 DIAGNOSIS — X58XXXA Exposure to other specified factors, initial encounter: Secondary | ICD-10-CM | POA: Diagnosis not present

## 2019-10-02 DIAGNOSIS — E119 Type 2 diabetes mellitus without complications: Secondary | ICD-10-CM | POA: Diagnosis present

## 2019-10-02 DIAGNOSIS — I10 Essential (primary) hypertension: Secondary | ICD-10-CM | POA: Diagnosis present

## 2019-10-02 DIAGNOSIS — E669 Obesity, unspecified: Secondary | ICD-10-CM | POA: Diagnosis present

## 2019-10-02 DIAGNOSIS — Z833 Family history of diabetes mellitus: Secondary | ICD-10-CM | POA: Diagnosis not present

## 2019-10-02 DIAGNOSIS — J9811 Atelectasis: Secondary | ICD-10-CM | POA: Diagnosis not present

## 2019-10-02 DIAGNOSIS — E1122 Type 2 diabetes mellitus with diabetic chronic kidney disease: Secondary | ICD-10-CM | POA: Diagnosis not present

## 2019-10-02 DIAGNOSIS — Z8249 Family history of ischemic heart disease and other diseases of the circulatory system: Secondary | ICD-10-CM | POA: Diagnosis not present

## 2019-10-02 DIAGNOSIS — J9600 Acute respiratory failure, unspecified whether with hypoxia or hypercapnia: Secondary | ICD-10-CM | POA: Diagnosis present

## 2019-10-02 DIAGNOSIS — N179 Acute kidney failure, unspecified: Secondary | ICD-10-CM | POA: Diagnosis not present

## 2019-10-02 DIAGNOSIS — Z79890 Hormone replacement therapy (postmenopausal): Secondary | ICD-10-CM | POA: Diagnosis not present

## 2019-10-02 DIAGNOSIS — I4891 Unspecified atrial fibrillation: Secondary | ICD-10-CM | POA: Diagnosis not present

## 2019-10-02 DIAGNOSIS — T884XXA Failed or difficult intubation, initial encounter: Secondary | ICD-10-CM | POA: Diagnosis not present

## 2019-10-02 DIAGNOSIS — Y999 Unspecified external cause status: Secondary | ICD-10-CM | POA: Diagnosis not present

## 2019-10-02 DIAGNOSIS — B952 Enterococcus as the cause of diseases classified elsewhere: Secondary | ICD-10-CM | POA: Diagnosis not present

## 2019-10-02 DIAGNOSIS — J988 Other specified respiratory disorders: Secondary | ICD-10-CM | POA: Diagnosis not present

## 2019-10-02 DIAGNOSIS — Z940 Kidney transplant status: Secondary | ICD-10-CM | POA: Diagnosis not present

## 2019-10-02 DIAGNOSIS — T783XXA Angioneurotic edema, initial encounter: Secondary | ICD-10-CM | POA: Diagnosis not present

## 2019-10-02 DIAGNOSIS — I120 Hypertensive chronic kidney disease with stage 5 chronic kidney disease or end stage renal disease: Secondary | ICD-10-CM | POA: Diagnosis not present

## 2019-10-02 DIAGNOSIS — J984 Other disorders of lung: Secondary | ICD-10-CM | POA: Diagnosis not present

## 2019-10-02 DIAGNOSIS — Z4682 Encounter for fitting and adjustment of non-vascular catheter: Secondary | ICD-10-CM | POA: Diagnosis not present

## 2019-10-02 DIAGNOSIS — Z7952 Long term (current) use of systemic steroids: Secondary | ICD-10-CM | POA: Diagnosis not present

## 2019-10-02 DIAGNOSIS — I517 Cardiomegaly: Secondary | ICD-10-CM | POA: Diagnosis not present

## 2019-10-02 DIAGNOSIS — J9690 Respiratory failure, unspecified, unspecified whether with hypoxia or hypercapnia: Secondary | ICD-10-CM | POA: Diagnosis not present

## 2019-10-02 DIAGNOSIS — I480 Paroxysmal atrial fibrillation: Secondary | ICD-10-CM | POA: Diagnosis present

## 2019-10-02 DIAGNOSIS — E785 Hyperlipidemia, unspecified: Secondary | ICD-10-CM | POA: Diagnosis present

## 2019-10-02 DIAGNOSIS — Z79899 Other long term (current) drug therapy: Secondary | ICD-10-CM | POA: Diagnosis not present

## 2019-10-02 DIAGNOSIS — J95811 Postprocedural pneumothorax: Secondary | ICD-10-CM | POA: Diagnosis not present

## 2019-10-02 DIAGNOSIS — N186 End stage renal disease: Secondary | ICD-10-CM | POA: Diagnosis not present

## 2019-10-02 DIAGNOSIS — E039 Hypothyroidism, unspecified: Secondary | ICD-10-CM | POA: Diagnosis present

## 2019-10-02 DIAGNOSIS — T464X5A Adverse effect of angiotensin-converting-enzyme inhibitors, initial encounter: Secondary | ICD-10-CM | POA: Diagnosis not present

## 2019-10-02 DIAGNOSIS — Z794 Long term (current) use of insulin: Secondary | ICD-10-CM | POA: Diagnosis not present

## 2019-10-02 DIAGNOSIS — Z930 Tracheostomy status: Secondary | ICD-10-CM | POA: Diagnosis not present

## 2019-10-02 DIAGNOSIS — Z7982 Long term (current) use of aspirin: Secondary | ICD-10-CM | POA: Diagnosis not present

## 2019-10-02 DIAGNOSIS — R7881 Bacteremia: Secondary | ICD-10-CM | POA: Diagnosis not present

## 2019-10-02 DIAGNOSIS — J939 Pneumothorax, unspecified: Secondary | ICD-10-CM | POA: Diagnosis not present

## 2019-10-02 DIAGNOSIS — R221 Localized swelling, mass and lump, neck: Secondary | ICD-10-CM | POA: Diagnosis not present

## 2019-10-02 DIAGNOSIS — Z992 Dependence on renal dialysis: Secondary | ICD-10-CM | POA: Diagnosis not present

## 2019-10-02 DIAGNOSIS — I340 Nonrheumatic mitral (valve) insufficiency: Secondary | ICD-10-CM | POA: Diagnosis not present

## 2019-10-18 DIAGNOSIS — T783XXD Angioneurotic edema, subsequent encounter: Secondary | ICD-10-CM | POA: Diagnosis not present

## 2019-10-18 DIAGNOSIS — R5381 Other malaise: Secondary | ICD-10-CM | POA: Diagnosis not present

## 2019-10-24 DIAGNOSIS — Z9889 Other specified postprocedural states: Secondary | ICD-10-CM | POA: Diagnosis not present

## 2019-10-24 DIAGNOSIS — T783XXA Angioneurotic edema, initial encounter: Secondary | ICD-10-CM | POA: Diagnosis not present

## 2019-11-08 DIAGNOSIS — Z23 Encounter for immunization: Secondary | ICD-10-CM | POA: Diagnosis not present

## 2019-11-08 DIAGNOSIS — G4700 Insomnia, unspecified: Secondary | ICD-10-CM | POA: Diagnosis not present

## 2019-11-08 DIAGNOSIS — Z0000 Encounter for general adult medical examination without abnormal findings: Secondary | ICD-10-CM | POA: Diagnosis not present

## 2019-11-08 DIAGNOSIS — I129 Hypertensive chronic kidney disease with stage 1 through stage 4 chronic kidney disease, or unspecified chronic kidney disease: Secondary | ICD-10-CM | POA: Diagnosis not present

## 2019-11-08 DIAGNOSIS — E782 Mixed hyperlipidemia: Secondary | ICD-10-CM | POA: Diagnosis not present

## 2019-11-08 DIAGNOSIS — Z1389 Encounter for screening for other disorder: Secondary | ICD-10-CM | POA: Diagnosis not present

## 2019-11-08 DIAGNOSIS — E1122 Type 2 diabetes mellitus with diabetic chronic kidney disease: Secondary | ICD-10-CM | POA: Diagnosis not present

## 2019-11-08 DIAGNOSIS — Z940 Kidney transplant status: Secondary | ICD-10-CM | POA: Diagnosis not present

## 2019-11-08 DIAGNOSIS — F3341 Major depressive disorder, recurrent, in partial remission: Secondary | ICD-10-CM | POA: Diagnosis not present

## 2019-11-08 DIAGNOSIS — N183 Chronic kidney disease, stage 3 unspecified: Secondary | ICD-10-CM | POA: Diagnosis not present

## 2019-11-08 DIAGNOSIS — I2510 Atherosclerotic heart disease of native coronary artery without angina pectoris: Secondary | ICD-10-CM | POA: Diagnosis not present

## 2019-11-08 DIAGNOSIS — E039 Hypothyroidism, unspecified: Secondary | ICD-10-CM | POA: Diagnosis not present

## 2019-11-30 DIAGNOSIS — Z23 Encounter for immunization: Secondary | ICD-10-CM | POA: Diagnosis not present

## 2019-12-01 DIAGNOSIS — Z1231 Encounter for screening mammogram for malignant neoplasm of breast: Secondary | ICD-10-CM | POA: Diagnosis not present

## 2019-12-08 DIAGNOSIS — R1900 Intra-abdominal and pelvic swelling, mass and lump, unspecified site: Secondary | ICD-10-CM | POA: Diagnosis not present

## 2019-12-08 DIAGNOSIS — I10 Essential (primary) hypertension: Secondary | ICD-10-CM | POA: Diagnosis not present

## 2019-12-08 DIAGNOSIS — D631 Anemia in chronic kidney disease: Secondary | ICD-10-CM | POA: Diagnosis not present

## 2019-12-08 DIAGNOSIS — Z940 Kidney transplant status: Secondary | ICD-10-CM | POA: Diagnosis not present

## 2019-12-08 DIAGNOSIS — M7989 Other specified soft tissue disorders: Secondary | ICD-10-CM | POA: Diagnosis not present

## 2019-12-09 ENCOUNTER — Other Ambulatory Visit: Payer: Self-pay | Admitting: Nephrology

## 2019-12-09 DIAGNOSIS — Z940 Kidney transplant status: Secondary | ICD-10-CM

## 2019-12-10 ENCOUNTER — Other Ambulatory Visit: Payer: Self-pay | Admitting: Endocrinology

## 2019-12-10 DIAGNOSIS — E890 Postprocedural hypothyroidism: Secondary | ICD-10-CM

## 2019-12-11 DIAGNOSIS — B029 Zoster without complications: Secondary | ICD-10-CM | POA: Diagnosis not present

## 2019-12-12 ENCOUNTER — Other Ambulatory Visit: Payer: Self-pay | Admitting: Nephrology

## 2019-12-12 DIAGNOSIS — M7989 Other specified soft tissue disorders: Secondary | ICD-10-CM

## 2019-12-12 DIAGNOSIS — I10 Essential (primary) hypertension: Secondary | ICD-10-CM

## 2019-12-14 ENCOUNTER — Other Ambulatory Visit: Payer: Medicare Other

## 2019-12-22 DIAGNOSIS — B029 Zoster without complications: Secondary | ICD-10-CM | POA: Diagnosis not present

## 2019-12-26 ENCOUNTER — Other Ambulatory Visit: Payer: Self-pay

## 2019-12-26 ENCOUNTER — Ambulatory Visit: Payer: Medicare Other

## 2019-12-26 DIAGNOSIS — M7989 Other specified soft tissue disorders: Secondary | ICD-10-CM

## 2019-12-26 DIAGNOSIS — I10 Essential (primary) hypertension: Secondary | ICD-10-CM | POA: Diagnosis not present

## 2019-12-29 ENCOUNTER — Ambulatory Visit
Admission: RE | Admit: 2019-12-29 | Discharge: 2019-12-29 | Disposition: A | Discharge status: Home / Self Care | Payer: Medicare Other | Source: Ambulatory Visit | Attending: Nephrology | Admitting: Nephrology

## 2019-12-29 DIAGNOSIS — M7989 Other specified soft tissue disorders: Secondary | ICD-10-CM | POA: Diagnosis not present

## 2019-12-29 DIAGNOSIS — Z940 Kidney transplant status: Secondary | ICD-10-CM | POA: Diagnosis not present

## 2020-01-02 DIAGNOSIS — R922 Inconclusive mammogram: Secondary | ICD-10-CM | POA: Diagnosis not present

## 2020-01-02 DIAGNOSIS — R928 Other abnormal and inconclusive findings on diagnostic imaging of breast: Secondary | ICD-10-CM | POA: Diagnosis not present

## 2020-01-05 DIAGNOSIS — T783XXA Angioneurotic edema, initial encounter: Secondary | ICD-10-CM | POA: Diagnosis not present

## 2020-01-05 DIAGNOSIS — I10 Essential (primary) hypertension: Secondary | ICD-10-CM | POA: Diagnosis not present

## 2020-01-05 DIAGNOSIS — I4892 Unspecified atrial flutter: Secondary | ICD-10-CM | POA: Diagnosis not present

## 2020-01-05 DIAGNOSIS — M7989 Other specified soft tissue disorders: Secondary | ICD-10-CM | POA: Diagnosis not present

## 2020-01-05 DIAGNOSIS — B029 Zoster without complications: Secondary | ICD-10-CM | POA: Diagnosis not present

## 2020-01-05 DIAGNOSIS — E119 Type 2 diabetes mellitus without complications: Secondary | ICD-10-CM | POA: Diagnosis not present

## 2020-01-05 DIAGNOSIS — T464X5A Adverse effect of angiotensin-converting-enzyme inhibitors, initial encounter: Secondary | ICD-10-CM | POA: Diagnosis not present

## 2020-01-05 DIAGNOSIS — Z940 Kidney transplant status: Secondary | ICD-10-CM | POA: Diagnosis not present

## 2020-01-06 ENCOUNTER — Other Ambulatory Visit: Payer: Self-pay

## 2020-01-06 ENCOUNTER — Ambulatory Visit: Payer: Medicare Other | Admitting: Cardiology

## 2020-01-06 ENCOUNTER — Encounter: Payer: Self-pay | Admitting: Cardiology

## 2020-01-06 VITALS — BP 148/75 | HR 70 | Resp 16 | Ht 63.0 in | Wt 199.0 lb

## 2020-01-06 DIAGNOSIS — R9431 Abnormal electrocardiogram [ECG] [EKG]: Secondary | ICD-10-CM | POA: Diagnosis not present

## 2020-01-06 DIAGNOSIS — Z940 Kidney transplant status: Secondary | ICD-10-CM | POA: Diagnosis not present

## 2020-01-06 DIAGNOSIS — I4892 Unspecified atrial flutter: Secondary | ICD-10-CM | POA: Diagnosis not present

## 2020-01-06 NOTE — Progress Notes (Signed)
Patient referred by Antony Contras, MD for atrial flutter  Subjective:   Sandy Roy, female    DOB: 1950-04-22, 69 y.o.   MRN: [id number]   Chief Complaint  Patient presents with   Atrial Flutter   New Patient (Initial Visit)     HPI  69 y.o. African American female with CKD s/p kidney transplant, hypertension, hyperlipidemia, type 2 DM, referred for leg edema and abnormal EKG  Patient was recently taken off lisinopril; due to angioedema. Since then, she had leg swelling and increase blood pressure. This has now improved after starting lasix. She underwent echocardiogram that showed no major abnormalities. Single lead on echocardiogram raised doubt about atrial flutter.  Patient does not have any complaints of palpitations, chest pain, shortness of breath. She has had severe shingles rash on right side of her chest. She still has severe pain and itching.    Past Medical History:  Diagnosis Date   Chronic kidney disease    ESRD-  Hemo- M_W_F   CONGESTIVE HEART FAILURE 12/14/2006   CORONARY ARTERY DISEASE 12/14/2006   Depression    not recently   DIABETES MELLITUS, TYPE II 12/14/2006   Type 2   HYPERLIPIDEMIA 12/14/2006   HYPERTENSION 12/14/2006   Hypoparathyroidism ([place]) 12/21/2006   Hypopotassemia 01/13/2007   HYPOTHYROIDISM, POSTSURGICAL 12/15/2006   NEOPLASM, MALIGNANT, THYROID GLAND 12/15/2006   Stage 1 papillary adenocarcinoma, one very small focus each lobe (53m, 254m  6/09: CT neck: no evidence of recurrence   OBSTRUCTIVE SLEEP APNEA 02/23/2007   does not use a cpap   Orbital fracture (HCC)    left eye   Shortness of breath dyspnea    with exertion     Past Surgical History:  Procedure Laterality Date   BASCILIC VEIN TRANSPOSITION Right 02/07/2014   Procedure: FIRST STAGE BASILIC VEIN TRANSPOSITION;  Surgeon: BrConrad BurlingtonMD;  [facility];  Laterality: Right;   BASteenight 05/30/2014    Procedure: SECOND STAGE BASILIC VEIN TRANSPOSITION;  Surgeon: BrConrad BurlingtonMD;  [facility];  Laterality: Right;   BREAST SURGERY Left    nipple leaking, had surgery to fix the leak   [facility]     [facility]   TONS[facility]LECTOMY     TUBAL LIGATION       Social History   Tobacco Use  Smoking Status Former Smoker   Types: Cigarettes  Smokeless Tobacco Never Used  Tobacco Comment   "quit years ago"    Social History   Substance and Sexual Activity  Alcohol Use No   Alcohol/week: 0.0 standard drinks     Family History  Problem Relation Age of Onset   Heart failure Mother    Diabetes Mother    Cancer Father    Cancer Other    COPD Other    Hypertension Other    Hyperlipidemia Other    Stroke Other    Heart disease Other    Diabetes Other      Current Outpatient Medications on File Prior to Visit  Medication Sig Dispense Refill   amLODipine (NORVASC) 10 MG tablet Take 10 mg by mouth daily.      aspirin (BAYER LOW STRENGTH) 81 MG EC tablet Take 81 mg by mouth daily.  atorvastatin (LIPITOR) 10 MG tablet Take 10 mg by mouth daily.     CALCIUM PO Take by mouth.     cloNIDine (CATAPRES) 0.2 MG tablet Take 0.1 mg by mouth 2 (two) times daily.      fish oil-omega-3 fatty acids 1000 MG capsule Take 1 capsule by mouth daily.       glucose blood (ONETOUCH VERIO) test strip USE ONE STRIP TO CHECK GLUCOSE TWICE DAILY Dx code: E11.9 200 each 3   insulin NPH Human (HUMULIN N) 100 UNIT/ML injection Inject 0.3 mLs (30 Units total) into the skin every morning. E11.9 27 mL 0   insulin regular (HUMULIN R) 100 units/mL injection Inject 0.25 mLs (25 Units total) into the skin daily with breakfast. E11.9 23 mL 0   Insulin Syringe-Needle U-100 (B-D INSULIN SYRINGE) 31G X 5/16" 0.3 ML MISC Use 1 time per day.  100 each 1   labetalol (NORMODYNE) 100 MG tablet 2 in the morning 2 in the evening  2   Lancets (ONETOUCH ULTRASOFT) lancets Use to check blood sugar 2 times per day 100 each 2   levothyroxine (SYNTHROID) 112 MCG tablet TAKE 1 TABLET BY MOUTH DAILY BEFORE BREAKFAST. 90 tablet 0   Multiple Vitamins-Minerals (MULTIVITAMIN WITH MINERALS) tablet Take 1 tablet by mouth daily.     mycophenolate (MYFORTIC) 180 MG EC tablet Take 180 mg by mouth 2 (two) times daily.     nitroGLYCERIN (NITROSTAT) 0.4 MG SL tablet Place 1 tablet (0.4 mg total) under the tongue every 5 (five) minutes as needed. 25 tablet 5   omeprazole (PRILOSEC) 20 MG capsule Take 20 mg by mouth daily.     predniSONE (DELTASONE) 5 MG tablet Take 5 mg by mouth daily with breakfast.     sulfamethoxazole-trimethoprim (BACTRIM,SEPTRA) 400-80 MG per tablet 1 tablet. On Monday Wednesday and Friday     tacrolimus (PROGRAF) 1 MG capsule Take 5 mg by mouth. 6 tabs in the morning 6 tabs in the evening     triazolam (HALCION) 0.25 MG tablet 2 tablets by mouth at bedtime      zinc gluconate 50 MG tablet Take 50 mg by mouth daily.     No current facility-administered medications on file prior to visit.    Cardiovascular and other pertinent studies:  EKG 01/06/2020: Sinus rhythm 67 bpm with rate variation  Right atrial enlargement Baseline artifact  Echocardiogram 12/26/2019:  Normal LV systolic function with visual EF 60-65%. Left ventricle cavity  is normal in size. Mild left ventricular hypertrophy. Normal global wall  motion. Normal LAP. Unable to evaluate diastolic function due to  underlying rhythm.  Mild (Grade I) aortic regurgitation.  Mild pulmonic regurgitation.  Insignificant pericardial effusion.  Of note, based on the single lead ECG strip the underlying rhythm  suggestive of atrial flutter clinical correlation recommended.  No prior study for comparison.   Recent labs: 12/08/2019: Glucose 194, BUN/Cr 20/1.41.  EGFR 43. Na/K 142/4.4. Rest of the CMP normal H/H 10/32. MCV 91. Platelets 355 Iron 24 mcg/dl (27-139) Iron sat 8% (15-55%)    Review of Systems  Cardiovascular: Positive for leg swelling (Now improving). Negative for chest pain, dyspnea on exertion, palpitations and syncope.  Skin: Positive for rash (Shingles).         Vitals:   01/06/20 0938  BP: (!) 148/75  Pulse: 70  Resp: 16  SpO2: 96%     Body mass index is 35.25 kg/m. Filed Weights   01/06/20 0938  Weight: 199 lb (90.3  kg)     Objective:   Physical Exam Detailed exam deferred due to active shingles infection No significant leg edema.      Assessment & Recommendations:   69 y.o. African American female with CKD s/p kidney transplant, hypertension, hyperlipidemia, type 2 DM, referred for leg edema and abnormal EKG  Reviewed EKG. There is a lot of artifact, but is probably sinus rhythm. I did not ask to repeat the EKG due to her active shingles rash. In any case, her rate is controlled, even if she were to have atrial flutter. I recommend repeat visit for EKG and possibly monitor placement for further evaluation.   Thank you for referring the patient to [place]. Please feel free to contact with any questions.   Nigel Mormon, MD Pager: [phone] Office: [phone]

## 2020-01-12 ENCOUNTER — Other Ambulatory Visit: Payer: Self-pay | Admitting: Endocrinology

## 2020-01-12 DIAGNOSIS — E1122 Type 2 diabetes mellitus with diabetic chronic kidney disease: Secondary | ICD-10-CM

## 2020-01-12 DIAGNOSIS — N185 Chronic kidney disease, stage 5: Secondary | ICD-10-CM

## 2020-01-12 MED ORDER — INSULIN NPH (HUMAN) (ISOPHANE) 100 UNIT/ML ~~LOC~~ SUSP: 30.0000 [IU] | SUBCUTANEOUS | 0 refills | Status: DC

## 2020-01-12 MED ORDER — INSULIN REGULAR HUMAN 100 UNIT/ML IJ SOLN: 25.0000 [IU] | Freq: Every day | INTRAMUSCULAR | 0 refills | Status: DC

## 2020-01-12 NOTE — Telephone Encounter (Signed)
Orders placed.

## 2020-01-12 NOTE — Telephone Encounter (Signed)
[facility] called to check status on 2 RX Humulin N and Humulin R (were sent by fax 01/09/20, 01/11/20, and E-scribed today

## 2020-01-17 DIAGNOSIS — B029 Zoster without complications: Secondary | ICD-10-CM | POA: Diagnosis not present

## 2020-01-23 ENCOUNTER — Other Ambulatory Visit: Payer: Self-pay

## 2020-01-23 DIAGNOSIS — N6012 Diffuse cystic mastopathy of left breast: Secondary | ICD-10-CM | POA: Diagnosis not present

## 2020-02-01 DIAGNOSIS — E119 Type 2 diabetes mellitus without complications: Secondary | ICD-10-CM | POA: Diagnosis not present

## 2020-02-01 DIAGNOSIS — Z940 Kidney transplant status: Secondary | ICD-10-CM | POA: Diagnosis not present

## 2020-02-06 ENCOUNTER — Ambulatory Visit: Payer: Medicare Other | Admitting: Cardiology

## 2020-02-06 ENCOUNTER — Ambulatory Visit: Payer: Medicare Other

## 2020-02-06 ENCOUNTER — Other Ambulatory Visit: Payer: Self-pay

## 2020-02-06 VITALS — BP 137/56 | HR 59 | Resp 16 | Ht 63.0 in | Wt 197.0 lb

## 2020-02-06 DIAGNOSIS — R9431 Abnormal electrocardiogram [ECG] [EKG]: Secondary | ICD-10-CM

## 2020-02-06 DIAGNOSIS — R011 Cardiac murmur, unspecified: Secondary | ICD-10-CM | POA: Insufficient documentation

## 2020-02-06 DIAGNOSIS — I10 Essential (primary) hypertension: Secondary | ICD-10-CM | POA: Diagnosis not present

## 2020-02-06 NOTE — Progress Notes (Signed)
Patient referred by Antony Contras, MD for atrial flutter  Subjective:   Sandy Roy, female    DOB: 01-Mar-1951, 69 y.o.   MRN: [id number]   Chief Complaint  Patient presents with   Abnormal ECG     HPI  69 y.o. African American female with CKD s/p kidney transplant, hypertension, hyperlipidemia, type 2 DM, referred for leg edema and abnormal EKG  Patient is here for follow up. Echocardiogram showed mild AI, PI, and raised possibility of atrial flutter as the underlying rhythm. She denies any symptoms, although her baseline physical activity is limited.   Initial consultation HPI 12/2019: Patient was recently taken off lisinopril due to angioedema. Since then, she had leg swelling and increase blood pressure. This has now improved after starting lasix. She underwent echocardiogram that showed no major abnormalities. Single lead on echocardiogram raised doubt about atrial flutter.  Patient does not have any complaints of palpitations, chest pain, shortness of breath. She has had severe shingles rash on right side of her chest. She still has severe pain and itching.    Current Outpatient Medications on File Prior to Visit  Medication Sig Dispense Refill   aspirin (BAYER LOW STRENGTH) 81 MG EC tablet Take 81 mg by mouth daily.       atorvastatin (LIPITOR) 10 MG tablet Take 10 mg by mouth daily.     CALCIUM PO Take by mouth.     cloNIDine (CATAPRES) 0.2 MG tablet Take 0.1 mg by mouth 2 (two) times daily.      ferrous sulfate 325 (65 FE) MG EC tablet Take 325 mg by mouth 3 (three) times daily with meals.     fish oil-omega-3 fatty acids 1000 MG capsule Take 1 capsule by mouth daily.       furosemide (LASIX) 40 MG tablet Take 40 mg by mouth daily.     gabapentin (NEURONTIN) 400 MG capsule Take 400 mg by mouth daily.     glucose blood (ONETOUCH VERIO) test strip USE ONE STRIP TO CHECK GLUCOSE TWICE DAILY Dx code: E11.9 200 each 3   insulin NPH Human (HUMULIN N) 100  UNIT/ML injection Inject 0.3 mLs (30 Units total) into the skin every morning. E11.9 27 mL 0   insulin regular (HUMULIN R) 100 units/mL injection Inject 0.25 mLs (25 Units total) into the skin daily with breakfast. E11.9 23 mL 0   Insulin Syringe-Needle U-100 (B-D INSULIN SYRINGE) 31G X 5/16" 0.3 ML MISC Use 1 time per day. 100 each 1   labetalol (NORMODYNE) 100 MG tablet 2 in the morning 2 in the evening  2   Lancets (ONETOUCH ULTRASOFT) lancets Use to check blood sugar 2 times per day 100 each 2   levothyroxine (SYNTHROID) 112 MCG tablet TAKE 1 TABLET BY MOUTH DAILY BEFORE BREAKFAST. 90 tablet 0   Multiple Vitamins-Minerals (MULTIVITAMIN WITH MINERALS) tablet Take 1 tablet by mouth daily.     mycophenolate (MYFORTIC) 180 MG EC tablet Take 180 mg by mouth 2 (two) times daily.     nitroGLYCERIN (NITROSTAT) 0.4 MG SL tablet Place 1 tablet (0.4 mg total) under the tongue every 5 (five) minutes as needed. 25 tablet 5   omeprazole (PRILOSEC) 20 MG capsule Take 20 mg by mouth daily.     potassium chloride SA (KLOR-CON) 20 MEQ tablet Take 1 tablet by mouth daily.     predniSONE (DELTASONE) 5 MG tablet Take 5 mg by mouth daily with breakfast.     sulfamethoxazole-trimethoprim (BACTRIM,SEPTRA) 400-80 MG  per tablet 1 tablet. On Monday Wednesday and Friday     tacrolimus (PROGRAF) 1 MG capsule Take 5 mg by mouth. 6 tabs in the morning 6 tabs in the evening     triazolam (HALCION) 0.25 MG tablet 2 tablets by mouth at bedtime      zinc gluconate 50 MG tablet Take 50 mg by mouth daily.     No current facility-administered medications on file prior to visit.    Cardiovascular and other pertinent studies:  EKG 02/06/2020: Wandering atrial pacemaker at the rate of 64 bpm.  Normal axis.  Poor R wave progression, cannot exclude anteroseptal infarct old.  Nonspecific T abnormality.    EKG 01/06/2020: Sinus rhythm 67 bpm with rate variation  Right atrial enlargement Baseline  artifact  Echocardiogram 12/26/2019:  Normal LV systolic function with visual EF 60-65%. Left ventricle cavity  is normal in size. Mild left ventricular hypertrophy. Normal global wall  motion. Normal LAP. Unable to evaluate diastolic function due to  underlying rhythm.  Mild (Grade I) aortic regurgitation.  Mild pulmonic regurgitation.  Insignificant pericardial effusion.  Of note, based on the single lead ECG strip the underlying rhythm  suggestive of atrial flutter clinical correlation recommended.  No prior study for comparison.   Recent labs: 12/08/2019: Glucose 194, BUN/Cr 20/1.41. EGFR 43. Na/K 142/4.4. Rest of the CMP normal H/H 10/32. MCV 91. Platelets 355 Iron 24 mcg/dl (27-139) Iron sat 8% (15-55%)    Review of Systems  Cardiovascular: Positive for leg swelling (Now improving). Negative for chest pain, dyspnea on exertion, palpitations and syncope.  Skin: Positive for rash (Shingles).         Vitals:   02/06/20 0948 02/06/20 0950  BP: (!) 130/44 (!) 137/56  Pulse: 60 (!) 59  Resp: 16   SpO2: 95%      Body mass index is 34.9 kg/m. Filed Weights   02/06/20 0948  Weight: 197 lb (89.4 kg)     Objective:   Physical Exam Vitals and nursing note reviewed.  Constitutional:      General: She is not in acute distress. Neck:     Vascular: No JVD.  Cardiovascular:     Rate and Rhythm: Normal rate and regular rhythm.     Heart sounds: Murmur heard. High-pitched blowing decrescendo early diastolic murmur is present with a grade of 2/4 at the upper right sternal border radiating to the apex.   Pulmonary:     Effort: Pulmonary effort is normal.     Breath sounds: Normal breath sounds. No wheezing or rales.         Assessment & Recommendations:   69 y.o. African American female with CKD s/p kidney transplant, hypertension, hyperlipidemia, type 2 DM, mild AI, PI, abnormal EKG  Abnormal EKG: EKG is again not definitive for afib/flutter. There are  distinct P waves, although they are possibly ectopic in origin. Recommend 2 week monitor.  Murmur: Mild AI, PI. Clinically asymptomatic.,    Nigel Mormon, MD Pager: [phone] Office: [phone]

## 2020-02-08 ENCOUNTER — Ambulatory Visit (INDEPENDENT_AMBULATORY_CARE_PROVIDER_SITE_OTHER): Payer: Medicare Other | Admitting: Endocrinology

## 2020-02-08 ENCOUNTER — Other Ambulatory Visit: Payer: Self-pay

## 2020-02-08 VITALS — BP 142/78 | HR 74 | Ht 63.0 in | Wt 198.0 lb

## 2020-02-08 DIAGNOSIS — N185 Chronic kidney disease, stage 5: Secondary | ICD-10-CM | POA: Diagnosis not present

## 2020-02-08 DIAGNOSIS — Z794 Long term (current) use of insulin: Secondary | ICD-10-CM

## 2020-02-08 DIAGNOSIS — E1122 Type 2 diabetes mellitus with diabetic chronic kidney disease: Secondary | ICD-10-CM

## 2020-02-08 LAB — POCT GLYCOSYLATED HEMOGLOBIN (HGB A1C): Hemoglobin A1C: 8.6 % — AB (ref 4.0–5.6)

## 2020-02-08 MED ORDER — INSULIN NPH (HUMAN) (ISOPHANE) 100 UNIT/ML ~~LOC~~ SUSP: 32.0000 [IU] | SUBCUTANEOUS | 0 refills | Status: DC

## 2020-02-08 MED ORDER — INSULIN REGULAR HUMAN 100 UNIT/ML IJ SOLN: 27.0000 [IU] | Freq: Every day | INTRAMUSCULAR | 0 refills | Status: DC

## 2020-02-08 NOTE — Progress Notes (Signed)
Subjective:    Patient ID: Sandy Roy, female    DOB: 25-Mar-1950, 69 y.o.   MRN: [id number]  HPI Pt returns for f/u of diabetes mellitus: DM type: insulin-requiring type 2 Dx'ed: 3646 Complications: CAD and ESRD (transplant 2016).   Therapy: insulin since 2015.   GDM: never.   DKA: never.  Severe hypoglycemia: never.   Pancreatitis: never.   SDOH:  She no longer checks cbg's: she takes human insulin, due to cost Other: she has requested a QD, inexpensive insulin regimen; she is chronically on low-dose prednisone; the pattern of her cbg's indicates she needs R and N QAM, but no insulin later in the day.  fructosamine has confirmed A1c Interval history: pt states she feels well in general.  She takes 2 insulins as rx'ed.  She says cbg's var from 150-200's pt had thyroidectomy for 2 small foci of papillary cancer (2008).  foci were not large enough to warrant RAI.  F/u US in 2021 showed small amount of residual thyroid tissue.  She is uncertain about synthroid dosage.   Past Medical History:  Diagnosis Date  . Chronic kidney disease    ESRD-  [facility]- M_[id number]  . CONGESTIVE HEART FAILURE 12/14/2006  . CORONARY ARTERY DISEASE 12/14/2006  . Depression    not recently  . DIABETES MELLITUS, TYPE II 12/14/2006   Type 2  . HYPERLIPIDEMIA 12/14/2006  . HYPERTENSION 12/14/2006  . Hypoparathyroidism ([place]) 12/21/2006  . Hypopotassemia 01/13/2007  . HYPOTHYROIDISM, POSTSURGICAL 12/15/2006  . NEOPLASM, MALIGNANT, THYROID GLAND 12/15/2006   Stage 1 papillary adenocarcinoma, one very small focus each lobe (31mm, 38mm)  6/09: CT neck: no evidence of recurrence  . OBSTRUCTIVE SLEEP APNEA 02/23/2007   does not use a cpap  . Orbital fracture ([facility])    left eye  . Shortness of breath dyspnea    with exertion    Past Surgical History:  Procedure Laterality Date  . BASCILIC VEIN TRANSPOSITION Right 02/07/2014   Procedure: FIRST STAGE BASILIC VEIN TRANSPOSITION;  Surgeon: Conrad Brule, MD;  Location: [facility];  Service: Vascular;  Laterality: Right;  . BASCILIC VEIN TRANSPOSITION Right 05/30/2014   Procedure: SECOND STAGE BASILIC VEIN TRANSPOSITION;  Surgeon: Conrad Buchanan, MD;  Location: [place] [facility];  Service: Vascular;  Laterality: Right;  . BREAST SURGERY Left    nipple leaking, had surgery to fix the leak  . CARDIAC CATHETERIZATION     ? 10 years ago  . CHOLECYSTECTOMY    . COLONOSCOPY    . ORBITAL FRACTURE SURGERY    . OVARY SURGERY    . THYROIDECTOMY  2008  . TONSILLECTOMY    . TUBAL LIGATION      Social History   Socioeconomic History  . Marital status: Widowed    Spouse name: Not on file  . Number of children: Not on file  . Years of education: Not on file  . Highest education level: Not on file  Occupational History  . Not on file  Tobacco Use  . Smoking status: Former Smoker    Types: Cigarettes  . Smokeless tobacco: Never Used  . Tobacco comment: "quit years ago"  Vaping Use  . Vaping Use: Never used  Substance and Sexual Activity  . Alcohol use: No    Alcohol/week: 0.0 standard drinks  . Drug use: No  . Sexual activity: Not on file  Other Topics Concern  . Not on file  Social History Narrative  . Not on file   Social Determinants  of Health   Financial Resource Strain:   . Difficulty of Paying Living Expenses: Not on file  Food Insecurity:   . Worried About Charity fundraiser in the Last Year: Not on file  . Ran Out of Food in the Last Year: Not on file  Transportation Needs:   . Lack of Transportation (Medical): Not on file  . Lack of Transportation (Non-Medical): Not on file  Physical Activity:   . Days of Exercise per Week: Not on file  . Minutes of Exercise per Session: Not on file  Stress:   . Feeling of Stress : Not on file  Social Connections:   . Frequency of Communication with Friends and Family: Not on file  . Frequency of Social Gatherings with Friends and Family: Not on file  . Attends Religious Services: Not on file  . Active Member of  Clubs or Organizations: Not on file  . Attends Archivist Meetings: Not on file  . Marital Status: Not on file  Intimate Partner Violence:   . Fear of Current or Ex-Partner: Not on file  . Emotionally Abused: Not on file  . Physically Abused: Not on file  . Sexually Abused: Not on file    Current Outpatient Medications on File Prior to Visit  Medication Sig Dispense Refill  . aspirin (BAYER LOW STRENGTH) 81 MG EC tablet Take 81 mg by mouth daily.      Marland Kitchen atorvastatin (LIPITOR) 10 MG tablet Take 10 mg by mouth daily.    Marland Kitchen CALCIUM PO Take by mouth.    . cloNIDine (CATAPRES) 0.2 MG tablet Take 0.1 mg by mouth 2 (two) times daily.     . ferrous sulfate 325 (65 FE) MG EC tablet Take 325 mg by mouth 3 (three) times daily with meals.    . fish oil-omega-3 fatty acids 1000 MG capsule Take 1 capsule by mouth daily.      . furosemide (LASIX) 40 MG tablet Take 40 mg by mouth daily.    Marland Kitchen gabapentin (NEURONTIN) 400 MG capsule Take 400 mg by mouth daily.    Marland Kitchen glucose blood (ONETOUCH VERIO) test strip USE ONE STRIP TO CHECK GLUCOSE TWICE DAILY Dx code: E11.9 200 each 3  . Insulin Syringe-Needle U-100 (B-D INSULIN SYRINGE) 31G X 5/16" 0.3 ML MISC Use 1 time per day. 100 each 1  . labetalol (NORMODYNE) 100 MG tablet 2 in the morning 2 in the evening  2  . Lancets (ONETOUCH ULTRASOFT) lancets Use to check blood sugar 2 times per day 100 each 2  . levothyroxine (SYNTHROID) 112 MCG tablet TAKE 1 TABLET BY MOUTH DAILY BEFORE BREAKFAST. 90 tablet 0  . Multiple Vitamins-Minerals (MULTIVITAMIN WITH MINERALS) tablet Take 1 tablet by mouth daily.    . mycophenolate (MYFORTIC) 180 MG EC tablet Take 180 mg by mouth 2 (two) times daily.    . nitroGLYCERIN (NITROSTAT) 0.4 MG SL tablet Place 1 tablet (0.4 mg total) under the tongue every 5 (five) minutes as needed. 25 tablet 5  . omeprazole (PRILOSEC) 20 MG capsule Take 20 mg by mouth daily.    . potassium chloride SA (KLOR-CON) 20 MEQ tablet Take 1 tablet by  mouth daily.    . predniSONE (DELTASONE) 5 MG tablet Take 5 mg by mouth daily with breakfast.    . sulfamethoxazole-trimethoprim (BACTRIM,SEPTRA) 400-80 MG per tablet 1 tablet. On Monday Wednesday and Friday    . tacrolimus (PROGRAF) 1 MG capsule Take 5 mg by mouth. 6 tabs  in the morning 6 tabs in the evening    . triazolam (HALCION) 0.25 MG tablet 2 tablets by mouth at bedtime     . zinc gluconate 50 MG tablet Take 50 mg by mouth daily.     No current facility-administered medications on file prior to visit.    Allergies  Allergen Reactions  . Lisinopril Swelling    Family History  Problem Relation Age of Onset  . Heart failure Mother   . Diabetes Mother   . Cancer Father   . Cancer Other   . COPD Other   . Hypertension Other   . Hyperlipidemia Other   . Stroke Other   . Heart disease Other   . Diabetes Other     BP (!) 142/78   Pulse 74   Ht 5\' 3"  (1.6 m)   Wt 198 lb (89.8 kg)   SpO2 99%   BMI 35.07 kg/m    Review of Systems     Objective:   Physical Exam VITAL SIGNS:  See vs page GENERAL: no distress NECK: a healed scar is present (also tracheostomy 2021).  I do not appreciate a nodule in the thyroid or elsewhere in the neck.  Pulses: dorsalis pedis intact bilat.   MSK: no deformity of the feet CV: 1+ bilat leg edema.   Skin:  no ulcer on the feet.  normal color and temp on the feet.  Neuro: sensation is intact to touch on the feet.    Lab Results  Component Value Date   HGBA1C 8.6 (A) 02/08/2020        Assessment & Plan:  Insulin-requiring type 2 DM: uncontrolled.   Patient Instructions  check your blood sugar twice a day.  vary the time of day when you check, between before the 3 meals, and at bedtime.  also check if you have symptoms of your blood sugar being too high or too low.  please keep a record of the readings and bring it to your next appointment here.  You can write it on any piece of paper.  please call us sooner if your blood sugar goes  below 70, or if you have a lot of readings over 200.  Please increase the insulins to the numbers listed below On this type of insulin schedule, you should eat meals on a regular schedule.  If a meal is missed or significantly delayed, your blood sugar could go low.  Please come back for a follow-up appointment in 3 months.

## 2020-02-08 NOTE — Patient Instructions (Addendum)
check your blood sugar twice a day.  vary the time of day when you check, between before the 3 meals, and at bedtime.  also check if you have symptoms of your blood sugar being too high or too low.  please keep a record of the readings and bring it to your next appointment here.  You can write it on any piece of paper.  please call us sooner if your blood sugar goes below 70, or if you have a lot of readings over 200.  Please increase the insulins to the numbers listed below On this type of insulin schedule, you should eat meals on a regular schedule.  If a meal is missed or significantly delayed, your blood sugar could go low.  Please come back for a follow-up appointment in 3 months.

## 2020-02-08 NOTE — Progress Notes (Signed)
poct

## 2020-02-15 DIAGNOSIS — B029 Zoster without complications: Secondary | ICD-10-CM | POA: Diagnosis not present

## 2020-02-15 DIAGNOSIS — Z940 Kidney transplant status: Secondary | ICD-10-CM | POA: Diagnosis not present

## 2020-02-15 DIAGNOSIS — I4892 Unspecified atrial flutter: Secondary | ICD-10-CM | POA: Diagnosis not present

## 2020-02-15 DIAGNOSIS — E119 Type 2 diabetes mellitus without complications: Secondary | ICD-10-CM | POA: Diagnosis not present

## 2020-02-15 DIAGNOSIS — M7989 Other specified soft tissue disorders: Secondary | ICD-10-CM | POA: Diagnosis not present

## 2020-02-15 DIAGNOSIS — I10 Essential (primary) hypertension: Secondary | ICD-10-CM | POA: Diagnosis not present

## 2020-03-01 DIAGNOSIS — R9431 Abnormal electrocardiogram [ECG] [EKG]: Secondary | ICD-10-CM | POA: Diagnosis not present

## 2020-03-12 ENCOUNTER — Telehealth: Payer: Self-pay | Admitting: Cardiology

## 2020-03-12 ENCOUNTER — Other Ambulatory Visit: Payer: Self-pay | Admitting: Endocrinology

## 2020-03-12 DIAGNOSIS — R9431 Abnormal electrocardiogram [ECG] [EKG]: Secondary | ICD-10-CM | POA: Diagnosis not present

## 2020-03-12 DIAGNOSIS — N185 Chronic kidney disease, stage 5: Secondary | ICD-10-CM

## 2020-03-12 DIAGNOSIS — E890 Postprocedural hypothyroidism: Secondary | ICD-10-CM

## 2020-03-12 DIAGNOSIS — Z794 Long term (current) use of insulin: Secondary | ICD-10-CM

## 2020-03-12 NOTE — Telephone Encounter (Signed)
Error

## 2020-03-21 DIAGNOSIS — E162 Hypoglycemia, unspecified: Secondary | ICD-10-CM | POA: Diagnosis not present

## 2020-03-21 DIAGNOSIS — I10 Essential (primary) hypertension: Secondary | ICD-10-CM | POA: Diagnosis not present

## 2020-03-21 DIAGNOSIS — E161 Other hypoglycemia: Secondary | ICD-10-CM | POA: Diagnosis not present

## 2020-03-21 DIAGNOSIS — R0689 Other abnormalities of breathing: Secondary | ICD-10-CM | POA: Diagnosis not present

## 2020-03-21 DIAGNOSIS — R4020 Unspecified coma: Secondary | ICD-10-CM | POA: Diagnosis not present

## 2020-03-23 ENCOUNTER — Telehealth: Payer: Self-pay

## 2020-03-23 DIAGNOSIS — I480 Paroxysmal atrial fibrillation: Secondary | ICD-10-CM

## 2020-03-23 MED ORDER — APIXABAN 2.5 MG PO TABS: 5.0000 mg | ORAL_TABLET | Freq: Two times a day (BID) | ORAL | 2 refills | Status: DC

## 2020-03-23 NOTE — Telephone Encounter (Signed)
Patient called that she is having difficulty breathing patient is scheduled to see you next month please advise

## 2020-03-23 NOTE — Telephone Encounter (Signed)
Patient feels short of breath and that her heart could stop. She cannot elaborate further on this, but wonders if it is related to her hypoglycemia episode yesterday. Her BG dropped to 27. EMS arrived and asked her to eat a lot of sweets. She denies having rapid heart beats.  Patient recently wore a monitor, which showed rate controlled Afib. CHA2DS2VASc score 4, annual stroke risk 5%.  I offered her an office visit today, she cant make it.  Increase lasix to 40 mg 2 pills in am and 1 pil in PM Recommend eliquis 5 mg bid after discussing risks and benefits.  I will see her on 03/27/2020. If symptoms worsen till then, she should go to the ED.   Nigel Mormon, MD Pager: [phone] Office: [phone]

## 2020-03-23 NOTE — Telephone Encounter (Signed)
I can see her today.

## 2020-03-27 ENCOUNTER — Ambulatory Visit: Payer: Medicare Other | Admitting: Cardiology

## 2020-04-04 ENCOUNTER — Ambulatory Visit: Payer: Medicare Other | Admitting: Cardiology

## 2020-04-04 ENCOUNTER — Other Ambulatory Visit: Payer: Self-pay

## 2020-04-04 ENCOUNTER — Encounter: Payer: Self-pay | Admitting: Cardiology

## 2020-04-04 VITALS — BP 138/58 | HR 63 | Temp 98.0°F | Resp 16 | Ht 63.0 in | Wt 202.0 lb

## 2020-04-04 DIAGNOSIS — I480 Paroxysmal atrial fibrillation: Secondary | ICD-10-CM

## 2020-04-04 DIAGNOSIS — R0602 Shortness of breath: Secondary | ICD-10-CM | POA: Diagnosis not present

## 2020-04-04 DIAGNOSIS — I10 Essential (primary) hypertension: Secondary | ICD-10-CM

## 2020-04-04 DIAGNOSIS — Z940 Kidney transplant status: Secondary | ICD-10-CM | POA: Diagnosis not present

## 2020-04-04 MED ORDER — NITROGLYCERIN 0.4 MG SL SUBL
0.4000 mg | SUBLINGUAL_TABLET | SUBLINGUAL | 5 refills | Status: DC | PRN
Start: 2020-04-04 — End: 2021-08-22

## 2020-04-04 MED ORDER — APIXABAN 5 MG PO TABS: 5.0000 mg | ORAL_TABLET | Freq: Two times a day (BID) | ORAL | 5 refills | Status: DC

## 2020-04-04 NOTE — Progress Notes (Signed)
Patient referred by Antony Contras, MD for atrial flutter  Subjective:   Sandy Roy, female    DOB: 18-Jun-1950, 70 y.o.   MRN: [id number]   Chief Complaint  Patient presents with  . Shortness of Breath  . Hypertension  . Follow-up     HPI  70 y.o. African American female with CKD s/p kidney transplant, hypertension, hyperlipidemia, type 2 DM, persistent Afib/flutter  Telephone encounter 03/24/2019: Patient feels short of breath and that her heart could stop. She cannot elaborate further on this, but wonders if it is related to her hypoglycemia episode yesterday. Her BG dropped to 27. EMS arrived and asked her to eat a lot of sweets. She denies having rapid heart beats.  Patient recently wore a monitor, which showed rate controlled Afib. CHA2DS2VASc score 4, annual stroke risk 5%.  I offered her an office visit today, she cant make it.  Increase lasix to 40 mg 2 pills in am and 1 pil in PM Recommend eliquis 5 mg bid after discussing risks and benefits.  Patient has not had any shortness of breath episode since then. She lives very sedentary lifestyle, loves to watch the TV most of the times when she's home. She endorses snoring at night, as well as daytime sleepiness. She dneies any chest pain.    Current Outpatient Medications on File Prior to Visit  Medication Sig Dispense Refill  . apixaban (ELIQUIS) 2.5 MG TABS tablet Take 2 tablets (5 mg total) by mouth 2 (two) times daily. 60 tablet 2  . atorvastatin (LIPITOR) 10 MG tablet Take 10 mg by mouth daily.    . calcitRIOL (ROCALTROL) 0.5 MCG capsule Take 0.5 mcg by mouth daily.    Marland Kitchen CALCIUM PO Take by mouth.    . cloNIDine (CATAPRES) 0.2 MG tablet Take 0.1 mg by mouth 2 (two) times daily.     . ferrous sulfate 325 (65 FE) MG EC tablet Take 325 mg by mouth 3 (three) times daily with meals.    . fish oil-omega-3 fatty acids 1000 MG capsule Take 1 capsule by mouth daily.    . furosemide (LASIX) 40 MG tablet Take 40 mg by  mouth daily.    Marland Kitchen gabapentin (NEURONTIN) 400 MG capsule Take 400 mg by mouth daily.    Marland Kitchen glucose blood (ONETOUCH VERIO) test strip USE ONE STRIP TO CHECK GLUCOSE TWICE DAILY Dx code: E11.9 200 each 3  . HUMULIN N 100 UNIT/ML injection INJECT 30 UNITS INTO THE SKIN EVERY MORNING 30 mL 0  . insulin regular (HUMULIN R) 100 units/mL injection Inject 0.27 mLs (27 Units total) into the skin daily with breakfast. E11.9 23 mL 0  . Insulin Syringe-Needle U-100 (B-D INSULIN SYRINGE) 31G X 5/16" 0.3 ML MISC Use 1 time per day. 100 each 1  . labetalol (NORMODYNE) 100 MG tablet 2 in the morning 2 in the evening  2  . Lancets (ONETOUCH ULTRASOFT) lancets Use to check blood sugar 2 times per day 100 each 2  . levothyroxine (SYNTHROID) 112 MCG tablet TAKE 1 TABLET BY MOUTH DAILY BEFORE BREAKFAST. 90 tablet 0  . Multiple Vitamins-Minerals (MULTIVITAMIN WITH MINERALS) tablet Take 1 tablet by mouth daily.    . mycophenolate (MYFORTIC) 180 MG EC tablet Take 180 mg by mouth 2 (two) times daily.    . nitroGLYCERIN (NITROSTAT) 0.4 MG SL tablet Place 1 tablet (0.4 mg total) under the tongue every 5 (five) minutes as needed. 25 tablet 5  . omeprazole (PRILOSEC) 20 MG  capsule Take 20 mg by mouth daily.    . predniSONE (DELTASONE) 5 MG tablet Take 5 mg by mouth daily with breakfast.    . sulfamethoxazole-trimethoprim (BACTRIM,SEPTRA) 400-80 MG per tablet 1 tablet. On Monday Wednesday and Friday    . tacrolimus (PROGRAF) 1 MG capsule Take 5 mg by mouth. 6 tabs in the morning 6 tabs in the evening    . triazolam (HALCION) 0.25 MG tablet 2 tablets by mouth at bedtime    . hydrALAZINE (APRESOLINE) 50 MG tablet Take 50 mg by mouth 3 (three) times daily.    . potassium chloride SA (KLOR-CON) 20 MEQ tablet Take 1 tablet by mouth daily. (Patient not taking: Reported on 04/04/2020)     No current facility-administered medications on file prior to visit.    Cardiovascular and other pertinent studies:  Mobile cardiac telemetry 14  days 02/06/2020 - 02/20/2020:  Dominant rhythm: Atrial flutter/fibrillation  HR 48-144 bpm. Avg HR 75 bpm.  Afib/flutter burden 64%  Afib/flutter is asymptomatic.  Multiple episodes of atrial tachycardia, fastest at 133 bpm for 4 beats, longest for 7 beats at 118 bpm.  1.7% isolated SVE, <1% couplet/triplets.  <1% isolated VE, couplet/triplets.  No VT/high grade AV block, sinus pause >3sec noted.  1 patient triggered event, correlated with supraventricular ectopy.    EKG 02/06/2020: Wandering atrial pacemaker at the rate of 64 bpm.  Normal axis.  Poor R wave progression, cannot exclude anteroseptal infarct old.  Nonspecific T abnormality.    EKG 01/06/2020: Sinus rhythm 67 bpm with rate variation  Right atrial enlargement Baseline artifact  Echocardiogram 12/26/2019:  Normal LV systolic function with visual EF 60-65%. Left ventricle cavity  is normal in size. Mild left ventricular hypertrophy. Normal global wall  motion. Normal LAP. Unable to evaluate diastolic function due to  underlying rhythm.  Mild (Grade I) aortic regurgitation.  Mild pulmonic regurgitation.  Insignificant pericardial effusion.  Of note, based on the single lead ECG strip the underlying rhythm  suggestive of atrial flutter clinical correlation recommended.  No prior study for comparison.   Recent labs: 02/01/2020: Glucose 138, BUN/Cr 40/1.7. EGFR 28. HbA1C 8.6% Chol 230, TG 315, HDL 58, LDL N/A  12/08/2019: Glucose 194, BUN/Cr 20/1.41. EGFR 43. Na/K 142/4.4. Rest of the CMP normal H/H 10/32. MCV 91. Platelets 355 Iron 24 mcg/dl (27-139) Iron sat 8% (15-55%)  04/2019: TSH 4.2 normal    Review of Systems  Cardiovascular: Positive for leg swelling (Now improving). Negative for chest pain, dyspnea on exertion, palpitations and syncope.  Skin: Positive for rash (Shingles).        Vitals:   04/04/20 1052  BP: (!) 138/58  Pulse: 63  Resp: 16  Temp: 98 F (36.7 C)  SpO2: 92%     Body  mass index is 35.78 kg/m. Filed Weights   04/04/20 1052  Weight: 202 lb (91.6 kg)     Objective:   Physical Exam Vitals and nursing note reviewed.  Constitutional:      General: She is not in acute distress. Neck:     Vascular: No JVD.  Cardiovascular:     Rate and Rhythm: Normal rate and regular rhythm.     Heart sounds: Murmur heard.  High-pitched blowing decrescendo early diastolic murmur is present with a grade of 2/4 at the upper right sternal border radiating to the apex.   Pulmonary:     Effort: Pulmonary effort is normal.     Breath sounds: Normal breath sounds. No wheezing or rales.  Assessment & Recommendations:   70 y.o. African American female with CKD s/p kidney transplant, hypertension, hyperlipidemia, type 2 DM, persistent Afib/flutter  Paroxysmal Afib/flutter: Rate controlled. Recommend risk factor modification, especially obesity, OSA. Unfortunately, she does not want to undergo sleep study and wear CPAP.  CHA2DS2VASc score 4, annual stroke risk 5%. Recommend eliquis 5 mg bid  Repeat echocardiogram and f/u in 6 months    Nigel Mormon, MD Pager: [phone] Office: [phone]

## 2020-04-06 ENCOUNTER — Other Ambulatory Visit: Payer: Self-pay

## 2020-04-06 ENCOUNTER — Ambulatory Visit: Payer: Medicare Other | Admitting: Cardiology

## 2020-04-06 MED ORDER — FUROSEMIDE 40 MG PO TABS: ORAL_TABLET | ORAL | 1 refills | Status: DC

## 2020-04-10 ENCOUNTER — Other Ambulatory Visit: Payer: Self-pay

## 2020-04-10 MED ORDER — FUROSEMIDE 40 MG PO TABS: ORAL_TABLET | ORAL | 1 refills | Status: DC

## 2020-04-12 ENCOUNTER — Ambulatory Visit: Payer: Medicare Other | Admitting: Cardiology

## 2020-04-12 DIAGNOSIS — Z940 Kidney transplant status: Secondary | ICD-10-CM | POA: Diagnosis not present

## 2020-04-12 DIAGNOSIS — I10 Essential (primary) hypertension: Secondary | ICD-10-CM | POA: Diagnosis not present

## 2020-04-12 DIAGNOSIS — B029 Zoster without complications: Secondary | ICD-10-CM | POA: Diagnosis not present

## 2020-04-12 DIAGNOSIS — I4891 Unspecified atrial fibrillation: Secondary | ICD-10-CM | POA: Diagnosis not present

## 2020-04-12 DIAGNOSIS — E119 Type 2 diabetes mellitus without complications: Secondary | ICD-10-CM | POA: Diagnosis not present

## 2020-05-04 DIAGNOSIS — I4891 Unspecified atrial fibrillation: Secondary | ICD-10-CM | POA: Diagnosis not present

## 2020-05-04 DIAGNOSIS — R609 Edema, unspecified: Secondary | ICD-10-CM | POA: Diagnosis not present

## 2020-05-04 DIAGNOSIS — Z794 Long term (current) use of insulin: Secondary | ICD-10-CM | POA: Diagnosis not present

## 2020-05-04 DIAGNOSIS — E1122 Type 2 diabetes mellitus with diabetic chronic kidney disease: Secondary | ICD-10-CM | POA: Diagnosis not present

## 2020-05-04 DIAGNOSIS — E782 Mixed hyperlipidemia: Secondary | ICD-10-CM | POA: Diagnosis not present

## 2020-05-04 DIAGNOSIS — Z940 Kidney transplant status: Secondary | ICD-10-CM | POA: Diagnosis not present

## 2020-05-04 DIAGNOSIS — G4700 Insomnia, unspecified: Secondary | ICD-10-CM | POA: Diagnosis not present

## 2020-05-04 DIAGNOSIS — F3341 Major depressive disorder, recurrent, in partial remission: Secondary | ICD-10-CM | POA: Diagnosis not present

## 2020-05-04 DIAGNOSIS — D6869 Other thrombophilia: Secondary | ICD-10-CM | POA: Diagnosis not present

## 2020-05-04 DIAGNOSIS — I129 Hypertensive chronic kidney disease with stage 1 through stage 4 chronic kidney disease, or unspecified chronic kidney disease: Secondary | ICD-10-CM | POA: Diagnosis not present

## 2020-05-04 DIAGNOSIS — D179 Benign lipomatous neoplasm, unspecified: Secondary | ICD-10-CM | POA: Diagnosis not present

## 2020-05-04 DIAGNOSIS — I2510 Atherosclerotic heart disease of native coronary artery without angina pectoris: Secondary | ICD-10-CM | POA: Diagnosis not present

## 2020-05-04 DIAGNOSIS — N183 Chronic kidney disease, stage 3 unspecified: Secondary | ICD-10-CM | POA: Diagnosis not present

## 2020-05-07 ENCOUNTER — Other Ambulatory Visit: Payer: Self-pay | Admitting: Endocrinology

## 2020-05-07 DIAGNOSIS — E1122 Type 2 diabetes mellitus with diabetic chronic kidney disease: Secondary | ICD-10-CM

## 2020-05-07 DIAGNOSIS — Z794 Long term (current) use of insulin: Secondary | ICD-10-CM

## 2020-05-22 DIAGNOSIS — D179 Benign lipomatous neoplasm, unspecified: Secondary | ICD-10-CM | POA: Diagnosis not present

## 2020-05-22 DIAGNOSIS — B0229 Other postherpetic nervous system involvement: Secondary | ICD-10-CM | POA: Diagnosis not present

## 2020-06-07 ENCOUNTER — Other Ambulatory Visit: Payer: Self-pay | Admitting: Endocrinology

## 2020-06-07 DIAGNOSIS — E890 Postprocedural hypothyroidism: Secondary | ICD-10-CM

## 2020-06-11 DIAGNOSIS — Z940 Kidney transplant status: Secondary | ICD-10-CM | POA: Diagnosis not present

## 2020-06-13 ENCOUNTER — Ambulatory Visit (INDEPENDENT_AMBULATORY_CARE_PROVIDER_SITE_OTHER): Payer: Medicare Other | Admitting: Endocrinology

## 2020-06-13 ENCOUNTER — Other Ambulatory Visit: Payer: Self-pay

## 2020-06-13 VITALS — BP 126/54 | HR 56 | Ht 63.0 in | Wt 198.8 lb

## 2020-06-13 DIAGNOSIS — E1122 Type 2 diabetes mellitus with diabetic chronic kidney disease: Secondary | ICD-10-CM | POA: Diagnosis not present

## 2020-06-13 DIAGNOSIS — Z794 Long term (current) use of insulin: Secondary | ICD-10-CM | POA: Diagnosis not present

## 2020-06-13 DIAGNOSIS — N185 Chronic kidney disease, stage 5: Secondary | ICD-10-CM | POA: Diagnosis not present

## 2020-06-13 LAB — POCT GLYCOSYLATED HEMOGLOBIN (HGB A1C): Hemoglobin A1C: 7.4 % — AB (ref 4.0–5.6)

## 2020-06-13 MED ORDER — INSULIN REGULAR HUMAN 100 UNIT/ML IJ SOLN: 27.0000 [IU] | Freq: Every day | INTRAMUSCULAR | 3 refills | Status: DC

## 2020-06-13 MED ORDER — ONETOUCH VERIO VI STRP: 1.0000 | ORAL_STRIP | Freq: Two times a day (BID) | 3 refills | Status: AC

## 2020-06-13 MED ORDER — INSULIN NPH (HUMAN) (ISOPHANE) 100 UNIT/ML ~~LOC~~ SUSP: SUBCUTANEOUS | 3 refills | Status: DC

## 2020-06-13 NOTE — Patient Instructions (Addendum)
check your blood sugar twice a day.  vary the time of day when you check, between before the 3 meals, and at bedtime.  also check if you have symptoms of your blood sugar being too high or too low.  please keep a record of the readings and bring it to your next appointment here.  You can write it on any piece of paper.  please call us sooner if your blood sugar goes below 70, or if you have a lot of readings over 200.  It is important to also check if you have dizziness.  Please continue the same 2 insulins for now.    On this type of insulin schedule, you should eat meals on a regular schedule.  If a meal is missed or significantly delayed, your blood sugar could go low.   here is a new meter.  I have sent a prescription to your pharmacy, for strips.  Please come back for a follow-up appointment in 3 months.

## 2020-06-13 NOTE — Progress Notes (Signed)
Subjective:    Patient ID: Sandy Roy, female    DOB: 11-14-1950, 70 y.o.   MRN: [id number]  HPI Pt returns for f/u of diabetes mellitus: DM type: insulin-requiring type 2 Dx'ed: 0177 Complications: CAD and ESRD (transplant 2016).   Therapy: insulin since 2015.   GDM: never.   DKA: never.  Severe hypoglycemia: never.   Pancreatitis: never.   SDOH:  She no longer checks cbg's: she takes human insulin, due to cost.   Other: she has requested a QD, inexpensive insulin regimen; she is chronically on low-dose prednisone; the pattern of her cbg's indicates she needs R and N QAM, but no insulin later in the day.  fructosamine has confirmed A1c.  Interval history: pt states she feels well in general.  She takes 2 insulins as rx'ed.  She has intermitt lightheadedness.   pt had thyroidectomy for 2 small foci of papillary cancer (2008).  foci were not large enough to warrant RAI.  F/u US in 2021 showed small amount of residual thyroid tissue.   Past Medical History:  Diagnosis Date  . Chronic kidney disease    ESRD-  Hemo- M_W_F  . CONGESTIVE HEART FAILURE 12/14/2006  . CORONARY ARTERY DISEASE 12/14/2006  . Depression    not recently  . DIABETES MELLITUS, TYPE II 12/14/2006   Type 2  . HYPERLIPIDEMIA 12/14/2006  . HYPERTENSION 12/14/2006  . Hypoparathyroidism ([place]) 12/21/2006  . Hypopotassemia 01/13/2007  . HYPOTHYROIDISM, POSTSURGICAL 12/15/2006  . NEOPLASM, MALIGNANT, THYROID GLAND 12/15/2006   Stage 1 papillary adenocarcinoma, one very small focus each lobe (88mm, 35mm)  6/09: CT neck: no evidence of recurrence  . OBSTRUCTIVE SLEEP APNEA 02/23/2007   does not use a cpap  . Orbital fracture (H[facility])    left eye  . Shortness of breath dyspnea    with exertion    Past Surgical History:  Procedure Laterality Date  . BASCILIC VEIN TRANSPOSITION Right 02/07/2014   Procedure: FIRST STAGE BASILIC VEIN TRANSPOSITION;  Surgeon: Conrad [place], MD;  Location: [facility];  Service: Vascular;   Laterality: Right;  . BASCILIC VEIN TRANSPOSITION Right 05/30/2014   Procedure: SECOND STAGE BASILIC VEIN TRANSPOSITION;  Surgeon: Conrad [place], MD;  Location: [facility];  Service: Vascular;  Laterality: Right;  . BREAST SURGERY Left    nipple leaking, had surgery to fix the leak  . CARDIAC CATHETERIZATION     ? 10 years ago  . CHOLECYSTECTOMY    . COLONOSCOPY    . ORBITAL FRACTURE SURGERY    . OVARY SURGERY    . THYROIDECTOMY  2008  . TONSILLECTOMY    . TUBAL LIGATION      Social History   Socioeconomic History  . Marital status: Widowed    Spouse name: Not on file  . Number of children: Not on file  . Years of education: Not on file  . Highest education level: Not on file  Occupational History  . Not on file  Tobacco Use  . Smoking status: Former Smoker    Types: Cigarettes  . Smokeless tobacco: Never Used  . Tobacco comment: "quit years ago"  Vaping Use  . Vaping Use: Never used  Substance and Sexual Activity  . Alcohol use: No    Alcohol/week: 0.0 standard drinks  . Drug use: No  . Sexual activity: Not on file  Other Topics Concern  . Not on file  Social History Narrative  . Not on file   Social Determinants of Health  Financial Resource Strain: Not on file  Food Insecurity: Not on file  Transportation Needs: Not on file  Physical Activity: Not on file  Stress: Not on file  Social Connections: Not on file  Intimate Partner Violence: Not on file    Current Outpatient Medications on File Prior to Visit  Medication Sig Dispense Refill  . apixaban (ELIQUIS) 5 MG TABS tablet Take 1 tablet (5 mg total) by mouth 2 (two) times daily. 60 tablet 5  . atorvastatin (LIPITOR) 10 MG tablet Take 10 mg by mouth daily.    . calcitRIOL (ROCALTROL) 0.5 MCG capsule Take 0.5 mcg by mouth daily.    Marland Kitchen CALCIUM PO Take by mouth.    . cloNIDine (CATAPRES) 0.2 MG tablet Take 0.1 mg by mouth 2 (two) times daily.     . ferrous sulfate 325 (65 FE) MG EC tablet Take 325 mg by mouth 3  (three) times daily with meals.    . fish oil-omega-3 fatty acids 1000 MG capsule Take 1 capsule by mouth daily.    . furosemide (LASIX) 40 MG tablet 2 tabs in the am, 1 tab in the pm 90 tablet 1  . gabapentin (NEURONTIN) 400 MG capsule Take 400 mg by mouth daily.    . hydrALAZINE (APRESOLINE) 50 MG tablet Take 50 mg by mouth 3 (three) times daily.    . Insulin Syringe-Needle U-100 (B-D INSULIN SYRINGE) 31G X 5/16" 0.3 ML MISC Use 1 time per day. 100 each 1  . labetalol (NORMODYNE) 100 MG tablet 2 in the morning 2 in the evening  2  . Lancets (ONETOUCH ULTRASOFT) lancets Use to check blood sugar 2 times per day 100 each 2  . levothyroxine (SYNTHROID) 112 MCG tablet TAKE 1 TABLET BY MOUTH DAILY BEFORE BREAKFAST. 90 tablet 0  . Multiple Vitamins-Minerals (MULTIVITAMIN WITH MINERALS) tablet Take 1 tablet by mouth daily.    . mycophenolate (MYFORTIC) 180 MG EC tablet Take 180 mg by mouth 2 (two) times daily.    . nitroGLYCERIN (NITROSTAT) 0.4 MG SL tablet Place 1 tablet (0.4 mg total) under the tongue every 5 (five) minutes as needed. 25 tablet 5  . omeprazole (PRILOSEC) 20 MG capsule Take 20 mg by mouth daily.    . potassium chloride SA (KLOR-CON) 20 MEQ tablet Take 1 tablet by mouth daily.    . predniSONE (DELTASONE) 5 MG tablet Take 5 mg by mouth daily with breakfast.    . sulfamethoxazole-trimethoprim (BACTRIM,SEPTRA) 400-80 MG per tablet 1 tablet. On Monday Wednesday and Friday    . tacrolimus (PROGRAF) 1 MG capsule Take 5 mg by mouth. 6 tabs in the morning 6 tabs in the evening    . triazolam (HALCION) 0.25 MG tablet 2 tablets by mouth at bedtime     No current facility-administered medications on file prior to visit.    Allergies  Allergen Reactions  . Lisinopril Swelling    Family History  Problem Relation Age of Onset  . Heart failure Mother   . Diabetes Mother   . Cancer Father   . Cancer Other   . COPD Other   . Hypertension Other   . Hyperlipidemia Other   . Stroke Other    . Heart disease Other   . Diabetes Other     BP (!) 126/54 (BP Location: Right Arm, Patient Position: Sitting, Cuff Size: Normal)   Pulse (!) 56   Ht 5\' 3"  (1.6 m)   Wt 198 lb 12.8 oz (90.2 kg)   SpO2  91%   BMI 35.22 kg/m      Review of Systems     Objective:   Physical Exam VITAL SIGNS:  See vs page GENERAL: no distress Pulses: dorsalis pedis intact bilat.   MSK: no deformity of the feet CV: 2+ bilat leg edema Skin:  no ulcer on the feet.  normal color and temp on the feet. Neuro: sensation is intact to touch on the feet.      Lab Results  Component Value Date   HGBA1C 7.4 (A) 06/13/2020       Assessment & Plan:  Insulin-requiring type 2 DM Noncompliance with cbg recording: This limits rx of DM.   Patient Instructions  check your blood sugar twice a day.  vary the time of day when you check, between before the 3 meals, and at bedtime.  also check if you have symptoms of your blood sugar being too high or too low.  please keep a record of the readings and bring it to your next appointment here.  You can write it on any piece of paper.  please call us sooner if your blood sugar goes below 70, or if you have a lot of readings over 200.  It is important to also check if you have dizziness.  Please continue the same 2 insulins for now.    On this type of insulin schedule, you should eat meals on a regular schedule.  If a meal is missed or significantly delayed, your blood sugar could go low.   here is a new meter.  I have sent a prescription to your pharmacy, for strips.  Please come back for a follow-up appointment in 3 months.

## 2020-06-21 ENCOUNTER — Other Ambulatory Visit: Payer: Self-pay | Admitting: Endocrinology

## 2020-06-21 DIAGNOSIS — N185 Chronic kidney disease, stage 5: Secondary | ICD-10-CM

## 2020-06-21 DIAGNOSIS — Z940 Kidney transplant status: Secondary | ICD-10-CM | POA: Diagnosis not present

## 2020-06-21 DIAGNOSIS — I4891 Unspecified atrial fibrillation: Secondary | ICD-10-CM | POA: Diagnosis not present

## 2020-06-21 DIAGNOSIS — I10 Essential (primary) hypertension: Secondary | ICD-10-CM | POA: Diagnosis not present

## 2020-06-21 DIAGNOSIS — B029 Zoster without complications: Secondary | ICD-10-CM | POA: Diagnosis not present

## 2020-06-21 DIAGNOSIS — E119 Type 2 diabetes mellitus without complications: Secondary | ICD-10-CM | POA: Diagnosis not present

## 2020-06-28 ENCOUNTER — Other Ambulatory Visit: Payer: Self-pay | Admitting: Cardiology

## 2020-06-28 ENCOUNTER — Other Ambulatory Visit: Payer: Self-pay

## 2020-06-28 ENCOUNTER — Other Ambulatory Visit: Payer: Self-pay | Admitting: Endocrinology

## 2020-06-28 DIAGNOSIS — I480 Paroxysmal atrial fibrillation: Secondary | ICD-10-CM

## 2020-06-28 DIAGNOSIS — E1122 Type 2 diabetes mellitus with diabetic chronic kidney disease: Secondary | ICD-10-CM

## 2020-06-28 DIAGNOSIS — N185 Chronic kidney disease, stage 5: Secondary | ICD-10-CM

## 2020-06-29 DIAGNOSIS — Z940 Kidney transplant status: Secondary | ICD-10-CM | POA: Diagnosis not present

## 2020-07-03 ENCOUNTER — Other Ambulatory Visit: Payer: Self-pay

## 2020-07-03 DIAGNOSIS — I480 Paroxysmal atrial fibrillation: Secondary | ICD-10-CM

## 2020-07-03 MED ORDER — ELIQUIS 5 MG PO TABS: 1.0000 | ORAL_TABLET | Freq: Two times a day (BID) | ORAL | 5 refills | Status: DC

## 2020-07-15 DIAGNOSIS — R0689 Other abnormalities of breathing: Secondary | ICD-10-CM | POA: Diagnosis not present

## 2020-07-15 DIAGNOSIS — I4892 Unspecified atrial flutter: Secondary | ICD-10-CM | POA: Diagnosis not present

## 2020-07-15 DIAGNOSIS — R0602 Shortness of breath: Secondary | ICD-10-CM | POA: Diagnosis not present

## 2020-07-15 DIAGNOSIS — Z7982 Long term (current) use of aspirin: Secondary | ICD-10-CM | POA: Diagnosis not present

## 2020-07-15 DIAGNOSIS — Z833 Family history of diabetes mellitus: Secondary | ICD-10-CM | POA: Diagnosis not present

## 2020-07-15 DIAGNOSIS — Z20822 Contact with and (suspected) exposure to covid-19: Secondary | ICD-10-CM | POA: Diagnosis present

## 2020-07-15 DIAGNOSIS — R0902 Hypoxemia: Secondary | ICD-10-CM | POA: Diagnosis not present

## 2020-07-15 DIAGNOSIS — I4819 Other persistent atrial fibrillation: Secondary | ICD-10-CM | POA: Diagnosis present

## 2020-07-15 DIAGNOSIS — R062 Wheezing: Secondary | ICD-10-CM | POA: Diagnosis not present

## 2020-07-15 DIAGNOSIS — Z8249 Family history of ischemic heart disease and other diseases of the circulatory system: Secondary | ICD-10-CM | POA: Diagnosis not present

## 2020-07-15 DIAGNOSIS — Z79899 Other long term (current) drug therapy: Secondary | ICD-10-CM | POA: Diagnosis not present

## 2020-07-15 DIAGNOSIS — I272 Pulmonary hypertension, unspecified: Secondary | ICD-10-CM | POA: Diagnosis present

## 2020-07-15 DIAGNOSIS — R000 Tachycardia, unspecified: Secondary | ICD-10-CM | POA: Diagnosis not present

## 2020-07-15 DIAGNOSIS — E119 Type 2 diabetes mellitus without complications: Secondary | ICD-10-CM | POA: Diagnosis present

## 2020-07-15 DIAGNOSIS — Z794 Long term (current) use of insulin: Secondary | ICD-10-CM | POA: Diagnosis not present

## 2020-07-15 DIAGNOSIS — J9690 Respiratory failure, unspecified, unspecified whether with hypoxia or hypercapnia: Secondary | ICD-10-CM | POA: Diagnosis not present

## 2020-07-15 DIAGNOSIS — I517 Cardiomegaly: Secondary | ICD-10-CM | POA: Diagnosis not present

## 2020-07-15 DIAGNOSIS — I081 Rheumatic disorders of both mitral and tricuspid valves: Secondary | ICD-10-CM | POA: Diagnosis present

## 2020-07-15 DIAGNOSIS — Z841 Family history of disorders of kidney and ureter: Secondary | ICD-10-CM | POA: Diagnosis not present

## 2020-07-15 DIAGNOSIS — I5033 Acute on chronic diastolic (congestive) heart failure: Secondary | ICD-10-CM | POA: Diagnosis present

## 2020-07-15 DIAGNOSIS — Z888 Allergy status to other drugs, medicaments and biological substances status: Secondary | ICD-10-CM | POA: Diagnosis not present

## 2020-07-15 DIAGNOSIS — I4430 Unspecified atrioventricular block: Secondary | ICD-10-CM | POA: Diagnosis not present

## 2020-07-15 DIAGNOSIS — I110 Hypertensive heart disease with heart failure: Secondary | ICD-10-CM | POA: Diagnosis present

## 2020-07-15 DIAGNOSIS — Z7901 Long term (current) use of anticoagulants: Secondary | ICD-10-CM | POA: Diagnosis not present

## 2020-07-15 DIAGNOSIS — J9601 Acute respiratory failure with hypoxia: Secondary | ICD-10-CM | POA: Diagnosis present

## 2020-07-15 DIAGNOSIS — Z940 Kidney transplant status: Secondary | ICD-10-CM | POA: Diagnosis not present

## 2020-07-15 DIAGNOSIS — Z87891 Personal history of nicotine dependence: Secondary | ICD-10-CM | POA: Diagnosis not present

## 2020-08-02 ENCOUNTER — Ambulatory Visit
Admission: RE | Admit: 2020-08-02 | Discharge: 2020-08-02 | Disposition: A | Discharge status: Home / Self Care | Payer: Medicare Other | Source: Ambulatory Visit | Attending: Family Medicine | Admitting: Family Medicine

## 2020-08-02 ENCOUNTER — Other Ambulatory Visit: Payer: Self-pay | Admitting: Family Medicine

## 2020-08-02 DIAGNOSIS — B0229 Other postherpetic nervous system involvement: Secondary | ICD-10-CM | POA: Diagnosis not present

## 2020-08-02 DIAGNOSIS — R059 Cough, unspecified: Secondary | ICD-10-CM

## 2020-09-17 ENCOUNTER — Other Ambulatory Visit: Payer: Self-pay | Admitting: Endocrinology

## 2020-09-17 DIAGNOSIS — E890 Postprocedural hypothyroidism: Secondary | ICD-10-CM

## 2020-09-19 ENCOUNTER — Encounter: Payer: Self-pay | Admitting: Endocrinology

## 2020-09-19 ENCOUNTER — Ambulatory Visit (INDEPENDENT_AMBULATORY_CARE_PROVIDER_SITE_OTHER): Payer: Medicare Other | Admitting: Endocrinology

## 2020-09-19 ENCOUNTER — Other Ambulatory Visit: Payer: Self-pay

## 2020-09-19 VITALS — BP 110/68 | HR 92 | Ht 63.0 in | Wt 196.0 lb

## 2020-09-19 DIAGNOSIS — Z794 Long term (current) use of insulin: Secondary | ICD-10-CM | POA: Diagnosis not present

## 2020-09-19 DIAGNOSIS — I10 Essential (primary) hypertension: Secondary | ICD-10-CM | POA: Diagnosis not present

## 2020-09-19 DIAGNOSIS — E1122 Type 2 diabetes mellitus with diabetic chronic kidney disease: Secondary | ICD-10-CM | POA: Diagnosis not present

## 2020-09-19 DIAGNOSIS — I4891 Unspecified atrial fibrillation: Secondary | ICD-10-CM | POA: Diagnosis not present

## 2020-09-19 DIAGNOSIS — N185 Chronic kidney disease, stage 5: Secondary | ICD-10-CM

## 2020-09-19 DIAGNOSIS — E119 Type 2 diabetes mellitus without complications: Secondary | ICD-10-CM | POA: Diagnosis not present

## 2020-09-19 DIAGNOSIS — Z940 Kidney transplant status: Secondary | ICD-10-CM | POA: Diagnosis not present

## 2020-09-19 DIAGNOSIS — M7989 Other specified soft tissue disorders: Secondary | ICD-10-CM | POA: Diagnosis not present

## 2020-09-19 LAB — POCT GLYCOSYLATED HEMOGLOBIN (HGB A1C): Hemoglobin A1C: 11.6 % — AB (ref 4.0–5.6)

## 2020-09-19 MED ORDER — INSULIN REGULAR HUMAN 100 UNIT/ML IJ SOLN: 30.0000 [IU] | INTRAMUSCULAR | 3 refills | Status: DC

## 2020-09-19 NOTE — Progress Notes (Signed)
Subjective:    Patient ID: Sandy Roy, female    DOB: 09-18-1950, 70 y.o.   MRN: [id number]  HPI Pt returns for f/u of diabetes mellitus: DM type: insulin-requiring type 2 Dx'ed: 4098 Complications: CAD and ESRD (transplant 2016).   Therapy: insulin since 2015.   GDM: never.   DKA: never.  Severe hypoglycemia: never.   Pancreatitis: never.   SDOH:  She no longer checks cbg's: she takes human insulin, due to cost.   Other: she has requested a QD, inexpensive insulin regimen; she is chronically on low-dose prednisone; the pattern of her cbg's indicates she needs R and N QAM, but no insulin later in the day.  fructosamine has confirmed A1c.  Interval history: NPH insulin was stopped in hosp ([facility]), and she takes only Reg, 30 units QAM.  I gave pt a meter at last ov, due she does not know how to operate.   pt had thyroidectomy for 2 small foci of papillary cancer (2008).  foci were not large enough to warrant RAI.  F/u US in 2021 showed small amount of residual thyroid tissue.  Past Medical History:  Diagnosis Date   Chronic kidney disease    ESRD-  Hemo- M_W_F   CONGESTIVE HEART FAILURE 12/14/2006   CORONARY ARTERY DISEASE 12/14/2006   Depression    not recently   DIABETES MELLITUS, TYPE II 12/14/2006   Type 2   HYPERLIPIDEMIA 12/14/2006   HYPERTENSION 12/14/2006   Hypoparathyroidism ([place]) 12/21/2006   Hypopotassemia 01/13/2007   HYPOTHYROIDISM, POSTSURGICAL 12/15/2006   NEOPLASM, MALIGNANT, THYROID GLAND 12/15/2006   Stage 1 papillary adenocarcinoma, one very small focus each lobe (71mm, 59mm)  6/09: CT neck: no evidence of recurrence   OBSTRUCTIVE SLEEP APNEA 02/23/2007   does not use a cpap   Orbital fracture ([facility])    left eye   Shortness of breath dyspnea    with exertion    Past Surgical History:  Procedure Laterality Date   [place] Right 02/07/2014   Procedure: FIRST STAGE BASILIC VEIN TRANSPOSITION;  Surgeon: Conrad [place], MD;  Location: [facility];   Service: Vascular;  Laterality: Right;   [place] Right 05/30/2014   Procedure: SECOND STAGE BASILIC VEIN TRANSPOSITION;  Surgeon: Conrad [place], MD;  Location: [facility];  Service: Vascular;  Laterality: Right;   BREAST SURGERY Left    nipple leaking, had surgery to fix the leak   CARDIAC CATHETERIZATION     ? 10 years ago   [place]  2008   TONSILLECTOMY     TUBAL LIGATION      Social History   Socioeconomic History   Marital status: Widowed    Spouse name: Not on file   Number of children: Not on file   Years of education: Not on file   Highest education level: Not on file  Occupational History   Not on file  Tobacco Use   Smoking status: Former    Types: Cigarettes   Smokeless tobacco: Never   Tobacco comments:    "quit years ago"  Vaping Use   Vaping Use: Never used  Substance and Sexual Activity   Alcohol use: No    Alcohol/week: 0.0 standard drinks   Drug use: No   Sexual activity: Not on file  Other Topics Concern   Not on file  Social History Narrative  Not on file   Social Determinants of Health   Financial Resource Strain: Not on file  Food Insecurity: Not on file  Transportation Needs: Not on file  Physical Activity: Not on file  Stress: Not on file  Social Connections: Not on file  Intimate Partner Violence: Not on file    Current Outpatient Medications on File Prior to Visit  Medication Sig Dispense Refill   apixaban (ELIQUIS) 5 MG TABS tablet Take 1 tablet (5 mg total) by mouth 2 (two) times daily. 60 tablet 5   atorvastatin (LIPITOR) 10 MG tablet Take 10 mg by mouth daily.     calcitRIOL (ROCALTROL) 0.5 MCG capsule Take 0.5 mcg by mouth daily.     CALCIUM PO Take by mouth.     cloNIDine (CATAPRES) 0.2 MG tablet Take 0.1 mg by mouth 2 (two) times daily.      ferrous sulfate 325 (65 FE) MG EC tablet Take 325 mg by mouth 3 (three) times daily  with meals.     fish oil-omega-3 fatty acids 1000 MG capsule Take 1 capsule by mouth daily.     furosemide (LASIX) 40 MG tablet 2 tabs in the am, 1 tab in the pm 90 tablet 1   gabapentin (NEURONTIN) 400 MG capsule Take 400 mg by mouth daily.     glucose blood (ONETOUCH VERIO) test strip 1 each by Other route 2 (two) times daily. And lancets 2/day Dx code: E11.9 200 each 3   hydrALAZINE (APRESOLINE) 50 MG tablet Take 50 mg by mouth 3 (three) times daily.     Insulin Syringe-Needle U-100 (B-D INSULIN SYRINGE) 31G X 5/16" 0.3 ML MISC Use 1 time per day. 100 each 1   labetalol (NORMODYNE) 100 MG tablet 2 in the morning 2 in the evening  2   Lancets (ONETOUCH ULTRASOFT) lancets Use to check blood sugar 2 times per day 100 each 2   levothyroxine (SYNTHROID) 112 MCG tablet TAKE 1 TABLET BY MOUTH DAILY BEFORE BREAKFAST. 90 tablet 0   Multiple Vitamins-Minerals (MULTIVITAMIN WITH MINERALS) tablet Take 1 tablet by mouth daily.     mycophenolate (MYFORTIC) 180 MG EC tablet Take 180 mg by mouth 2 (two) times daily.     nitroGLYCERIN (NITROSTAT) 0.4 MG SL tablet Place 1 tablet (0.4 mg total) under the tongue every 5 (five) minutes as needed. 25 tablet 5   omeprazole (PRILOSEC) 20 MG capsule Take 20 mg by mouth daily.     potassium chloride SA (KLOR-CON) 20 MEQ tablet Take 1 tablet by mouth daily.     predniSONE (DELTASONE) 5 MG tablet Take 5 mg by mouth daily with breakfast.     sulfamethoxazole-trimethoprim (BACTRIM,SEPTRA) 400-80 MG per tablet 1 tablet. On Monday Wednesday and Friday     tacrolimus (PROGRAF) 1 MG capsule Take 5 mg by mouth. 6 tabs in the morning 6 tabs in the evening     triazolam (HALCION) 0.25 MG tablet 2 tablets by mouth at bedtime     No current facility-administered medications on file prior to visit.    Allergies  Allergen Reactions   Lisinopril Swelling    Family History  Problem Relation Age of Onset   Heart failure Mother    Diabetes Mother    Cancer Father    Cancer  Other    COPD Other    Hypertension Other    Hyperlipidemia Other    Stroke Other    Heart disease Other    Diabetes Other  BP 110/68   Pulse 92   Ht 5\' 3"  (1.6 m)   Wt 196 lb (88.9 kg)   SpO2 96%   BMI 34.72 kg/m    Review of Systems     Objective:   Physical Exam Pulses: dorsalis pedis intact bilat.   MSK: no deformity of the feet CV: 3+ left, and 2+ right leg edema.   Skin:  no ulcer on the feet.  normal color and temp on the feet. Neuro: sensation is intact to touch on the feet.    A1c=11.4%     Assessment & Plan:  Insulin-requiring type 2 DM: therapy limited by noncompliance with cbg monitoring.  We need this info in order to safely adjust insulin.   Patient Instructions  check your blood sugar twice a day.  vary the time of day when you check, between before the 3 meals, and at bedtime.  also check if you have symptoms of your blood sugar being too high or too low.  please keep a record of the readings and bring it to your next appointment here.  You can write it on any piece of paper.  please call us sooner if your blood sugar goes below 70, or if you have a lot of readings over 200.  It is important to also check if you have dizziness.  Please continue the same Reg insulin for now.    On this type of insulin schedule, you should eat meals on a regular schedule.  If a meal is missed or significantly delayed, your blood sugar could go low.   Please see Vaughan Basta, to lean about operating the meter. Please come back for a follow-up appointment in 2 weeks, at 4:30PM

## 2020-09-19 NOTE — Patient Instructions (Addendum)
check your blood sugar twice a day.  vary the time of day when you check, between before the 3 meals, and at bedtime.  also check if you have symptoms of your blood sugar being too high or too low.  please keep a record of the readings and bring it to your next appointment here.  You can write it on any piece of paper.  please call us sooner if your blood sugar goes below 70, or if you have a lot of readings over 200.  It is important to also check if you have dizziness.  Please continue the same Reg insulin for now.    On this type of insulin schedule, you should eat meals on a regular schedule.  If a meal is missed or significantly delayed, your blood sugar could go low.   Please see Vaughan Basta, to lean about operating the meter. Please come back for a follow-up appointment in 2 weeks, at 4:30PM

## 2020-09-20 DIAGNOSIS — N189 Chronic kidney disease, unspecified: Secondary | ICD-10-CM | POA: Diagnosis not present

## 2020-09-20 DIAGNOSIS — Z940 Kidney transplant status: Secondary | ICD-10-CM | POA: Diagnosis not present

## 2020-09-21 DIAGNOSIS — Z940 Kidney transplant status: Secondary | ICD-10-CM | POA: Diagnosis not present

## 2020-09-24 DIAGNOSIS — J3800 Paralysis of vocal cords and larynx, unspecified: Secondary | ICD-10-CM | POA: Diagnosis not present

## 2020-09-24 DIAGNOSIS — R490 Dysphonia: Secondary | ICD-10-CM | POA: Diagnosis not present

## 2020-09-24 DIAGNOSIS — Z9889 Other specified postprocedural states: Secondary | ICD-10-CM | POA: Diagnosis not present

## 2020-10-01 ENCOUNTER — Telehealth: Payer: Self-pay | Admitting: Endocrinology

## 2020-10-01 NOTE — Telephone Encounter (Signed)
Pt called with concerns of her high blood sugars. She states about a month ago the hospital took her off her Humulin N but since then her sugars have been ranging from 293, 240, 311, 322 and 222. Yesterday the 24th she says her BS were 403 at night and this morning she woke up and they are 279. She has not eaten anything yet this morning and she says even when she checks her BS twice a day they still are running high. Pt would like to know if she should start back on her Humulin N or what does the Dr reccomend?  Ph# [phone]

## 2020-10-01 NOTE — Telephone Encounter (Signed)
30u of insulin

## 2020-10-01 NOTE — Telephone Encounter (Signed)
Please advise 

## 2020-10-02 ENCOUNTER — Other Ambulatory Visit: Payer: Self-pay

## 2020-10-02 ENCOUNTER — Ambulatory Visit: Payer: Medicare Other

## 2020-10-02 DIAGNOSIS — I480 Paroxysmal atrial fibrillation: Secondary | ICD-10-CM | POA: Diagnosis not present

## 2020-10-02 NOTE — Telephone Encounter (Signed)
LVM for pt to increase her insulin to 40u qam per ellison.

## 2020-10-03 DIAGNOSIS — Z4822 Encounter for aftercare following kidney transplant: Secondary | ICD-10-CM | POA: Diagnosis not present

## 2020-10-03 DIAGNOSIS — I2510 Atherosclerotic heart disease of native coronary artery without angina pectoris: Secondary | ICD-10-CM | POA: Diagnosis not present

## 2020-10-03 DIAGNOSIS — I120 Hypertensive chronic kidney disease with stage 5 chronic kidney disease or end stage renal disease: Secondary | ICD-10-CM | POA: Diagnosis not present

## 2020-10-03 DIAGNOSIS — Z7952 Long term (current) use of systemic steroids: Secondary | ICD-10-CM | POA: Diagnosis not present

## 2020-10-03 DIAGNOSIS — I10 Essential (primary) hypertension: Secondary | ICD-10-CM | POA: Diagnosis not present

## 2020-10-03 DIAGNOSIS — Z79899 Other long term (current) drug therapy: Secondary | ICD-10-CM | POA: Diagnosis not present

## 2020-10-03 DIAGNOSIS — Z7901 Long term (current) use of anticoagulants: Secondary | ICD-10-CM | POA: Diagnosis not present

## 2020-10-03 DIAGNOSIS — B349 Viral infection, unspecified: Secondary | ICD-10-CM | POA: Diagnosis not present

## 2020-10-03 DIAGNOSIS — E1122 Type 2 diabetes mellitus with diabetic chronic kidney disease: Secondary | ICD-10-CM | POA: Diagnosis not present

## 2020-10-03 DIAGNOSIS — Z794 Long term (current) use of insulin: Secondary | ICD-10-CM | POA: Diagnosis not present

## 2020-10-03 DIAGNOSIS — D849 Immunodeficiency, unspecified: Secondary | ICD-10-CM | POA: Diagnosis not present

## 2020-10-03 DIAGNOSIS — Z955 Presence of coronary angioplasty implant and graft: Secondary | ICD-10-CM | POA: Diagnosis not present

## 2020-10-03 DIAGNOSIS — E039 Hypothyroidism, unspecified: Secondary | ICD-10-CM | POA: Diagnosis not present

## 2020-10-03 DIAGNOSIS — Z792 Long term (current) use of antibiotics: Secondary | ICD-10-CM | POA: Diagnosis not present

## 2020-10-03 DIAGNOSIS — E785 Hyperlipidemia, unspecified: Secondary | ICD-10-CM | POA: Diagnosis not present

## 2020-10-03 DIAGNOSIS — Z940 Kidney transplant status: Secondary | ICD-10-CM | POA: Diagnosis not present

## 2020-10-03 DIAGNOSIS — N186 End stage renal disease: Secondary | ICD-10-CM | POA: Diagnosis not present

## 2020-10-03 DIAGNOSIS — Z85850 Personal history of malignant neoplasm of thyroid: Secondary | ICD-10-CM | POA: Diagnosis not present

## 2020-10-09 DIAGNOSIS — Z940 Kidney transplant status: Secondary | ICD-10-CM | POA: Diagnosis not present

## 2020-10-09 DIAGNOSIS — Z4822 Encounter for aftercare following kidney transplant: Secondary | ICD-10-CM | POA: Diagnosis not present

## 2020-10-09 DIAGNOSIS — I499 Cardiac arrhythmia, unspecified: Secondary | ICD-10-CM | POA: Diagnosis not present

## 2020-10-09 DIAGNOSIS — Z79899 Other long term (current) drug therapy: Secondary | ICD-10-CM | POA: Diagnosis not present

## 2020-10-09 DIAGNOSIS — R7989 Other specified abnormal findings of blood chemistry: Secondary | ICD-10-CM | POA: Diagnosis not present

## 2020-10-10 ENCOUNTER — Other Ambulatory Visit: Payer: Self-pay

## 2020-10-10 ENCOUNTER — Ambulatory Visit: Payer: Medicare Other | Admitting: Cardiology

## 2020-10-10 ENCOUNTER — Encounter: Payer: Self-pay | Admitting: Cardiology

## 2020-10-10 VITALS — BP 124/62 | HR 72 | Temp 98.0°F | Resp 16 | Ht 62.0 in | Wt 186.0 lb

## 2020-10-10 DIAGNOSIS — I483 Typical atrial flutter: Secondary | ICD-10-CM | POA: Diagnosis not present

## 2020-10-10 DIAGNOSIS — I10 Essential (primary) hypertension: Secondary | ICD-10-CM | POA: Diagnosis not present

## 2020-10-10 DIAGNOSIS — I480 Paroxysmal atrial fibrillation: Secondary | ICD-10-CM | POA: Diagnosis not present

## 2020-10-10 NOTE — Progress Notes (Signed)
Patient referred by Antony Contras, MD for atrial flutter  Subjective:   Sandy Roy, female    DOB: 10-Jan-1951, 70 y.o.   MRN: [id number]   Chief Complaint  Patient presents with   Atrial Fibrillation   Hypertension   Follow-up    6 month   Results    echo     HPI  70 y.o. African American female with CAD, CKD s/p kidney transplant, hypertension, hyperlipidemia, type 2 DM, persistent Afib/flutter  Patient has recently had significant increase in her creatinine, up to 2.5.  Diabetes has been very uncontrolled, with A1c increasing to 11.8% as of yesterday.  Patient denies any chest pain, shortness of breath, orthopnea, PND symptoms.  Nonspecific questioning about her discussion with transplant nephrologist yesterday, patient tells me that it was all very "fuzzy", and she got emotional and started crying.  She tells me that she has been working on controlling her diabetes better with endocrinologist Dr. Loanne Drilling. Her insulin dose was recently increased.   Current Outpatient Medications on File Prior to Visit  Medication Sig Dispense Refill   apixaban (ELIQUIS) 5 MG TABS tablet Take 1 tablet (5 mg total) by mouth 2 (two) times daily. 60 tablet 5   atorvastatin (LIPITOR) 10 MG tablet Take 10 mg by mouth daily.     calcitRIOL (ROCALTROL) 0.5 MCG capsule Take 0.5 mcg by mouth daily.     CALCIUM PO Take by mouth.     cloNIDine (CATAPRES) 0.2 MG tablet Take 0.1 mg by mouth 2 (two) times daily.      ferrous sulfate 325 (65 FE) MG EC tablet Take 325 mg by mouth 3 (three) times daily with meals.     fish oil-omega-3 fatty acids 1000 MG capsule Take 1 capsule by mouth daily.     furosemide (LASIX) 40 MG tablet 2 tabs in the am, 1 tab in the pm 90 tablet 1   gabapentin (NEURONTIN) 400 MG capsule Take 400 mg by mouth daily.     glucose blood (ONETOUCH VERIO) test strip 1 each by Other route 2 (two) times daily. And lancets 2/day Dx code: E11.9 200 each 3   hydrALAZINE (APRESOLINE) 50  MG tablet Take 50 mg by mouth 3 (three) times daily.     insulin regular (HUMULIN R) 100 units/mL injection Inject 0.3 mLs (30 Units total) into the skin every morning. 30 mL 3   Insulin Syringe-Needle U-100 (B-D INSULIN SYRINGE) 31G X 5/16" 0.3 ML MISC Use 1 time per day. 100 each 1   labetalol (NORMODYNE) 100 MG tablet 2 in the morning 2 in the evening  2   Lancets (ONETOUCH ULTRASOFT) lancets Use to check blood sugar 2 times per day 100 each 2   levothyroxine (SYNTHROID) 112 MCG tablet TAKE 1 TABLET BY MOUTH DAILY BEFORE BREAKFAST. 90 tablet 0   Multiple Vitamins-Minerals (MULTIVITAMIN WITH MINERALS) tablet Take 1 tablet by mouth daily.     mycophenolate (MYFORTIC) 180 MG EC tablet Take 180 mg by mouth 2 (two) times daily.     nitroGLYCERIN (NITROSTAT) 0.4 MG SL tablet Place 1 tablet (0.4 mg total) under the tongue every 5 (five) minutes as needed. 25 tablet 5   omeprazole (PRILOSEC) 20 MG capsule Take 20 mg by mouth daily.     potassium chloride SA (KLOR-CON) 20 MEQ tablet Take 1 tablet by mouth daily.     predniSONE (DELTASONE) 5 MG tablet Take 5 mg by mouth daily with breakfast.     sulfamethoxazole-trimethoprim (  BACTRIM,SEPTRA) 400-80 MG per tablet 1 tablet. On Monday Wednesday and Friday     tacrolimus (PROGRAF) 1 MG capsule Take 5 mg by mouth. 6 tabs in the morning 6 tabs in the evening     triazolam (HALCION) 0.25 MG tablet 2 tablets by mouth at bedtime     No current facility-administered medications on file prior to visit.    Cardiovascular and other pertinent studies:  EKG 10/10/2020: Atrial flutter with variable AV conduction Controlled ventricular rate Nonspecific T-abnormality  Echocardiogram 10/02/2020:  Normal LV systolic function with visual EF 60-65%. Left ventricle cavity  is normal in size. Mild to moderate left ventricular hypertrophy. Normal  global wall motion. Unable to evaluate diastolic function due to atrial  flutter. Elevated LAP.  Left atrial cavity is  moderately dilated.  Mild to moderate mitral regurgitation.  Mild tricuspid regurgitation.  Mild pulmonic regurgitation.  Small pericardial effusion. There is no hemodynamic significance.  Compared to study 12/26/2019 no significant change.   Mobile cardiac telemetry 14 days 02/06/2020 - 02/20/2020:  Dominant rhythm: Atrial flutter/fibrillation  HR 48-144 bpm. Avg HR 75 bpm.  Afib/flutter burden 64%  Afib/flutter is asymptomatic.  Multiple episodes of atrial tachycardia, fastest at 133 bpm for 4 beats, longest for 7 beats at 118 bpm.  1.7% isolated SVE, <1% couplet/triplets.  <1% isolated VE, couplet/triplets.  No VT/high grade AV block, sinus pause >3sec noted.  1 patient triggered event, correlated with supraventricular ectopy.   Coronary angiogram 2012: 1. Abnormal left ventricular function with anteroapical defect compatible     with a previous infarction.  2. Diffusely narrowed and small distal LAD but no significant focal     obstructive stenoses noted.  3. Occlusion of the right coronary artery with collaterals from the left     coronary system.  EKG 02/06/2020: Wandering atrial pacemaker at the rate of 64 bpm.  Normal axis.  Poor R wave progression, cannot exclude anteroseptal infarct old.  Nonspecific T abnormality.    EKG 01/06/2020: Sinus rhythm 67 bpm with rate variation  Right atrial enlargement Baseline artifact  Recent labs: 10/09/2020: Glucose 306, BUN/Cr 39/2.5. EGFR 20. Na/K 137/4.5. Rest of the CMP normal H/H 12/38. MCV 93. Platelets 304 HbA1C 11.8% Chol 220, TG 194, HDL 70, LDL 109 Mag 1.5  02/01/2020: Glucose 138, BUN/Cr 40/1.7. EGFR 28. HbA1C 8.6% Chol 230, TG 315, HDL 58, LDL N/A  12/08/2019: Glucose 194, BUN/Cr 20/1.41. EGFR 43. Na/K 142/4.4. Rest of the CMP normal H/H 10/32. MCV 91. Platelets 355 Iron 24 mcg/dl (27-139) Iron sat 8% (15-55%)  04/2019: TSH 4.2 normal    Review of Systems  Cardiovascular:  Negative for chest pain, dyspnea  on exertion, leg swelling, palpitations and syncope.       Vitals:   10/10/20 1025  BP: 124/62  Pulse: 72  Resp: 16  Temp: 98 F (36.7 C)  SpO2: 95%     Body mass index is 34.02 kg/m. Filed Weights   10/10/20 1025  Weight: 186 lb (84.4 kg)     Objective:   Physical Exam Vitals and nursing note reviewed.  Constitutional:      General: She is not in acute distress. Neck:     Vascular: No JVD.  Cardiovascular:     Rate and Rhythm: Normal rate and regular rhythm.     Heart sounds: Normal heart sounds. No murmur heard. Pulmonary:     Effort: Pulmonary effort is normal.     Breath sounds: Normal breath sounds. No wheezing or  rales.  Musculoskeletal:     Right lower leg: Edema (Trace) present.        Assessment & Recommendations:   70 y.o. African American female with CAD, CKD s/p kidney transplant, hypertension, hyperlipidemia, type 2 DM, persistent Afib/flutter  Paroxysmal Afib/ persistent atrial flutter: Rate controlled. Recommend risk factor modification, especially obesity, OSA. Unfortunately, she does not want to undergo sleep study and wear CPAP.  CHA2DS2VASc score 4, annual stroke risk 5%. Recommend eliquis 5 mg bid  CAD: Known RCA CTO with left-to-right collaterals on coronary angiogram 2012. Given her ongoing use of Eliquis, recently increased creatinine, and absence of anginal symptoms, I suggest that she stop aspirin.  Continue management of uncontrolled DM, CKD with endocrinology, nephrology.   F/u in 6 months   Nigel Mormon, MD Pager: [phone] Office: [phone]

## 2020-10-15 ENCOUNTER — Telehealth: Payer: Self-pay

## 2020-10-15 NOTE — Telephone Encounter (Signed)
3 days.  Thanks MJP

## 2020-10-15 NOTE — Telephone Encounter (Signed)
Sandy Roy [phone] transplant team [facility] called pt is going to be having kidney biopsy  How long should they hold eliquis for ?

## 2020-10-19 ENCOUNTER — Other Ambulatory Visit: Payer: Self-pay | Admitting: Endocrinology

## 2020-10-19 DIAGNOSIS — Z794 Long term (current) use of insulin: Secondary | ICD-10-CM

## 2020-10-19 DIAGNOSIS — E1122 Type 2 diabetes mellitus with diabetic chronic kidney disease: Secondary | ICD-10-CM

## 2020-10-22 ENCOUNTER — Other Ambulatory Visit: Payer: Self-pay

## 2020-10-22 ENCOUNTER — Ambulatory Visit (INDEPENDENT_AMBULATORY_CARE_PROVIDER_SITE_OTHER): Payer: Medicare Other | Admitting: Endocrinology

## 2020-10-22 VITALS — BP 142/80 | HR 78 | Ht 62.0 in | Wt 178.4 lb

## 2020-10-22 DIAGNOSIS — E1122 Type 2 diabetes mellitus with diabetic chronic kidney disease: Secondary | ICD-10-CM

## 2020-10-22 DIAGNOSIS — N185 Chronic kidney disease, stage 5: Secondary | ICD-10-CM | POA: Diagnosis not present

## 2020-10-22 DIAGNOSIS — Z794 Long term (current) use of insulin: Secondary | ICD-10-CM | POA: Diagnosis not present

## 2020-10-22 MED ORDER — INSULIN REGULAR HUMAN 100 UNIT/ML IJ SOLN: 40.0000 [IU] | Freq: Every day | INTRAMUSCULAR | 3 refills | Status: DC

## 2020-10-22 NOTE — Patient Instructions (Addendum)
check your blood sugar twice a day.  vary the time of day when you check, between before the 3 meals, and at bedtime.  also check if you have symptoms of your blood sugar being too high or too low.  please keep a record of the readings and bring it to your next appointment here.  You can write it on any piece of paper.  please call us sooner if your blood sugar goes below 70, or if you have a lot of readings over 200.  It is important to also check if you have dizziness.  Please continue the same Reg insulin for now.    On this type of insulin schedule, you should eat meals on a regular schedule.  If a meal is missed or significantly delayed, your blood sugar could go low.   Please come back for a follow-up appointment in 6 weeks.

## 2020-10-22 NOTE — Progress Notes (Signed)
Subjective:    Patient ID: Sandy Roy, female    DOB: 08-24-1950, 70 y.o.   MRN: [id number]  HPI Pt returns for f/u of diabetes mellitus: DM type: insulin-requiring type 2 Dx'ed: 9357 Complications: CAD and ESRD (transplant 2016).   Therapy: insulin since 2015.   GDM: never.   DKA: never.  Severe hypoglycemia: never.   Pancreatitis: never.   SDOH:  She no longer checks cbg's: she takes human insulin, due to cost.   Other: she has requested a QD, inexpensive insulin regimen; she is chronically on low-dose prednisone; the pattern of her cbg's indicates she needs R and N QAM, but no insulin later in the day.  fructosamine has confirmed A1c.  Interval history: NPH insulin was stopped in hosp ([facility]), and she takes only Reg, 40 units QAM.  Pt says she knows how to use a meter.  she brings a record of her cbg's which I have reviewed today.  CBG varies from 129-403.  It is in general highest fasting, and lowest in the afternoon.   pt had thyroidectomy for 2 small foci of papillary cancer (2008).  foci were not large enough to warrant RAI.  F/u US in 2021 showed small amount of residual thyroid tissue. Past Medical History:  Diagnosis Date   Chronic kidney disease    ESRD-  Hemo- M_W_F   CONGESTIVE HEART FAILURE 12/14/2006   CORONARY ARTERY DISEASE 12/14/2006   Depression    not recently   DIABETES MELLITUS, TYPE II 12/14/2006   Type 2   HYPERLIPIDEMIA 12/14/2006   HYPERTENSION 12/14/2006   Hypoparathyroidism ([place]) 12/21/2006   Hypopotassemia 01/13/2007   HYPOTHYROIDISM, POSTSURGICAL 12/15/2006   NEOPLASM, MALIGNANT, THYROID GLAND 12/15/2006   Stage 1 papillary adenocarcinoma, one very small focus each lobe (54mm, 46mm)  6/09: CT neck: no evidence of recurrence   OBSTRUCTIVE SLEEP APNEA 02/23/2007   does not use a cpap   Orbital fracture (HCC)    left eye   Shortness of breath dyspnea    with exertion    Past Surgical History:  Procedure Laterality Date   [place]  Right 02/07/2014   Procedure: FIRST STAGE BASILIC VEIN TRANSPOSITION;  Surgeon: Conrad [place], MD;  Location: [facility];  Service: Vascular;  Laterality: Right;   [place] Right 05/30/2014   Procedure: SECOND STAGE BASILIC VEIN TRANSPOSITION;  Surgeon: Conrad [place], MD;  Location: [facility];  Service: Vascular;  Laterality: Right;   BREAST SURGERY Left    nipple leaking, had surgery to fix the leak   CARDIAC CATHETERIZATION     ? 10 years ago   [place]  2008   TONSILLECTOMY     TUBAL LIGATION      Social History   Socioeconomic History   Marital status: Widowed    Spouse name: Not on file   Number of children: Not on file   Years of education: Not on file   Highest education level: Not on file  Occupational History   Not on file  Tobacco Use   Smoking status: Former    Types: Cigarettes   Smokeless tobacco: Never   Tobacco comments:    "quit years ago"  Vaping Use   Vaping Use: Never used  Substance and Sexual Activity   Alcohol use: No    Alcohol/week: 0.0 standard drinks   Drug  use: No   Sexual activity: Not on file  Other Topics Concern   Not on file  Social History Narrative   Not on file   Social Determinants of Health   Financial Resource Strain: Not on file  Food Insecurity: Not on file  Transportation Needs: Not on file  Physical Activity: Not on file  Stress: Not on file  Social Connections: Not on file  Intimate Partner Violence: Not on file    Current Outpatient Medications on File Prior to Visit  Medication Sig Dispense Refill   apixaban (ELIQUIS) 5 MG TABS tablet Take 1 tablet (5 mg total) by mouth 2 (two) times daily. 60 tablet 5   atorvastatin (LIPITOR) 10 MG tablet Take 10 mg by mouth daily.     calcitRIOL (ROCALTROL) 0.5 MCG capsule Take 0.5 mcg by mouth daily.     CALCIUM PO Take by mouth.     cloNIDine (CATAPRES) 0.2 MG tablet Take 0.1 mg by  mouth 2 (two) times daily.      fish oil-omega-3 fatty acids 1000 MG capsule Take 1 capsule by mouth daily.     furosemide (LASIX) 40 MG tablet 2 tabs in the am, 1 tab in the pm 90 tablet 1   glucose blood (ONETOUCH VERIO) test strip 1 each by Other route 2 (two) times daily. And lancets 2/day Dx code: E11.9 200 each 3   hydrALAZINE (APRESOLINE) 50 MG tablet Take 25 mg by mouth 3 (three) times daily.     Insulin Syringe-Needle U-100 (B-D INSULIN SYRINGE) 31G X 5/16" 0.3 ML MISC Use 1 time per day. 100 each 1   labetalol (NORMODYNE) 100 MG tablet 2 in the morning 2 in the evening  2   Lancets (ONETOUCH ULTRASOFT) lancets Use to check blood sugar 2 times per day 100 each 2   levothyroxine (SYNTHROID) 112 MCG tablet TAKE 1 TABLET BY MOUTH DAILY BEFORE BREAKFAST. 90 tablet 0   Multiple Vitamins-Minerals (MULTIVITAMIN WITH MINERALS) tablet Take 1 tablet by mouth daily.     mycophenolate (MYFORTIC) 180 MG EC tablet Take 180 mg by mouth 2 (two) times daily.     nitroGLYCERIN (NITROSTAT) 0.4 MG SL tablet Place 1 tablet (0.4 mg total) under the tongue every 5 (five) minutes as needed. 25 tablet 5   omeprazole (PRILOSEC) 20 MG capsule Take 20 mg by mouth daily.     potassium chloride SA (KLOR-CON) 20 MEQ tablet Take 1 tablet by mouth daily.     predniSONE (DELTASONE) 5 MG tablet Take 5 mg by mouth daily with breakfast.     sulfamethoxazole-trimethoprim (BACTRIM,SEPTRA) 400-80 MG per tablet 1 tablet. On Monday Wednesday and Friday     tacrolimus (PROGRAF) 1 MG capsule Take 5 mg by mouth. 6 tabs in the morning 6 tabs in the evening     triazolam (HALCION) 0.25 MG tablet 2 tablets by mouth at bedtime     No current facility-administered medications on file prior to visit.    Allergies  Allergen Reactions   Lisinopril Swelling    Family History  Problem Relation Age of Onset   Heart failure Mother    Diabetes Mother    Cancer Father    Cancer Other    COPD Other    Hypertension Other     Hyperlipidemia Other    Stroke Other    Heart disease Other    Diabetes Other     BP (!) 142/80 (BP Location: Left Arm, Patient Position: Sitting, Cuff Size: Normal)  Pulse 78   Ht 5\' 2"  (1.575 m)   Wt 178 lb 6.4 oz (80.9 kg)   SpO2 97%   BMI 32.63 kg/m    Review of Systems She denies hypoglycemia.  She has lost 18 lbs since last ov.      Objective:   Physical Exam Pulses: dorsalis pedis intact bilat.   MSK: no deformity of the feet CV: no leg edema Skin:  no ulcer on the feet.  normal color and temp on the feet. Neuro: sensation is intact to touch on the feet.    Lab Results  Component Value Date   HGBA1C 11.6 (A) 09/19/2020       Assessment & Plan:  Insulin-requiring type 2 DM: uncontrolled.  She declines to increase insulin.  As she has lost weight, I said OK  Patient Instructions  check your blood sugar twice a day.  vary the time of day when you check, between before the 3 meals, and at bedtime.  also check if you have symptoms of your blood sugar being too high or too low.  please keep a record of the readings and bring it to your next appointment here.  You can write it on any piece of paper.  please call us sooner if your blood sugar goes below 70, or if you have a lot of readings over 200.  It is important to also check if you have dizziness.  Please continue the same Reg insulin for now.    On this type of insulin schedule, you should eat meals on a regular schedule.  If a meal is missed or significantly delayed, your blood sugar could go low.   Please come back for a follow-up appointment in 6 weeks.

## 2020-10-30 DIAGNOSIS — G894 Chronic pain syndrome: Secondary | ICD-10-CM | POA: Diagnosis not present

## 2020-10-30 DIAGNOSIS — Z79891 Long term (current) use of opiate analgesic: Secondary | ICD-10-CM | POA: Diagnosis not present

## 2020-10-30 DIAGNOSIS — M150 Primary generalized (osteo)arthritis: Secondary | ICD-10-CM | POA: Diagnosis not present

## 2020-10-30 DIAGNOSIS — F32A Depression, unspecified: Secondary | ICD-10-CM | POA: Diagnosis not present

## 2020-10-30 DIAGNOSIS — B0229 Other postherpetic nervous system involvement: Secondary | ICD-10-CM | POA: Diagnosis not present

## 2020-10-31 DIAGNOSIS — Z4822 Encounter for aftercare following kidney transplant: Secondary | ICD-10-CM | POA: Diagnosis not present

## 2020-10-31 DIAGNOSIS — Z940 Kidney transplant status: Secondary | ICD-10-CM | POA: Diagnosis not present

## 2020-11-19 DIAGNOSIS — B0229 Other postherpetic nervous system involvement: Secondary | ICD-10-CM | POA: Diagnosis not present

## 2020-11-19 DIAGNOSIS — R059 Cough, unspecified: Secondary | ICD-10-CM | POA: Diagnosis not present

## 2020-11-19 DIAGNOSIS — R634 Abnormal weight loss: Secondary | ICD-10-CM | POA: Diagnosis not present

## 2020-11-19 DIAGNOSIS — Z940 Kidney transplant status: Secondary | ICD-10-CM | POA: Diagnosis not present

## 2020-11-20 ENCOUNTER — Other Ambulatory Visit: Payer: Self-pay | Admitting: Family Medicine

## 2020-11-20 DIAGNOSIS — Z4822 Encounter for aftercare following kidney transplant: Secondary | ICD-10-CM | POA: Diagnosis not present

## 2020-11-20 DIAGNOSIS — T8610 Unspecified complication of kidney transplant: Secondary | ICD-10-CM | POA: Diagnosis not present

## 2020-11-20 DIAGNOSIS — E1121 Type 2 diabetes mellitus with diabetic nephropathy: Secondary | ICD-10-CM | POA: Diagnosis not present

## 2020-11-20 DIAGNOSIS — R634 Abnormal weight loss: Secondary | ICD-10-CM

## 2020-11-20 DIAGNOSIS — R059 Cough, unspecified: Secondary | ICD-10-CM

## 2020-11-20 DIAGNOSIS — Z79899 Other long term (current) drug therapy: Secondary | ICD-10-CM | POA: Diagnosis not present

## 2020-11-20 DIAGNOSIS — Z940 Kidney transplant status: Secondary | ICD-10-CM | POA: Diagnosis not present

## 2020-11-20 DIAGNOSIS — Z0189 Encounter for other specified special examinations: Secondary | ICD-10-CM | POA: Diagnosis not present

## 2020-11-27 DIAGNOSIS — B0229 Other postherpetic nervous system involvement: Secondary | ICD-10-CM | POA: Diagnosis not present

## 2020-11-27 DIAGNOSIS — G894 Chronic pain syndrome: Secondary | ICD-10-CM | POA: Diagnosis not present

## 2020-11-27 DIAGNOSIS — M150 Primary generalized (osteo)arthritis: Secondary | ICD-10-CM | POA: Diagnosis not present

## 2020-11-27 DIAGNOSIS — F32A Depression, unspecified: Secondary | ICD-10-CM | POA: Diagnosis not present

## 2020-12-04 DIAGNOSIS — Z0000 Encounter for general adult medical examination without abnormal findings: Secondary | ICD-10-CM | POA: Diagnosis not present

## 2020-12-04 DIAGNOSIS — E1122 Type 2 diabetes mellitus with diabetic chronic kidney disease: Secondary | ICD-10-CM | POA: Diagnosis not present

## 2020-12-04 DIAGNOSIS — E039 Hypothyroidism, unspecified: Secondary | ICD-10-CM | POA: Diagnosis not present

## 2020-12-04 DIAGNOSIS — Z794 Long term (current) use of insulin: Secondary | ICD-10-CM | POA: Diagnosis not present

## 2020-12-04 DIAGNOSIS — M25511 Pain in right shoulder: Secondary | ICD-10-CM | POA: Diagnosis not present

## 2020-12-04 DIAGNOSIS — F3341 Major depressive disorder, recurrent, in partial remission: Secondary | ICD-10-CM | POA: Diagnosis not present

## 2020-12-04 DIAGNOSIS — E782 Mixed hyperlipidemia: Secondary | ICD-10-CM | POA: Diagnosis not present

## 2020-12-04 DIAGNOSIS — G4700 Insomnia, unspecified: Secondary | ICD-10-CM | POA: Diagnosis not present

## 2020-12-04 DIAGNOSIS — I129 Hypertensive chronic kidney disease with stage 1 through stage 4 chronic kidney disease, or unspecified chronic kidney disease: Secondary | ICD-10-CM | POA: Diagnosis not present

## 2020-12-04 DIAGNOSIS — Z23 Encounter for immunization: Secondary | ICD-10-CM | POA: Diagnosis not present

## 2020-12-04 DIAGNOSIS — Z1389 Encounter for screening for other disorder: Secondary | ICD-10-CM | POA: Diagnosis not present

## 2020-12-04 DIAGNOSIS — B0229 Other postherpetic nervous system involvement: Secondary | ICD-10-CM | POA: Diagnosis not present

## 2020-12-10 ENCOUNTER — Other Ambulatory Visit: Payer: Self-pay

## 2020-12-10 ENCOUNTER — Ambulatory Visit (INDEPENDENT_AMBULATORY_CARE_PROVIDER_SITE_OTHER): Payer: Medicare Other | Admitting: Endocrinology

## 2020-12-10 ENCOUNTER — Ambulatory Visit
Admission: RE | Admit: 2020-12-10 | Discharge: 2020-12-10 | Disposition: A | Discharge status: Home / Self Care | Payer: Medicare Other | Source: Ambulatory Visit | Attending: Family Medicine | Admitting: Family Medicine

## 2020-12-10 ENCOUNTER — Other Ambulatory Visit: Payer: Self-pay | Admitting: Family Medicine

## 2020-12-10 VITALS — BP 106/58 | HR 90 | Ht 62.0 in | Wt 178.6 lb

## 2020-12-10 DIAGNOSIS — Z794 Long term (current) use of insulin: Secondary | ICD-10-CM | POA: Diagnosis not present

## 2020-12-10 DIAGNOSIS — Z940 Kidney transplant status: Secondary | ICD-10-CM | POA: Diagnosis not present

## 2020-12-10 DIAGNOSIS — R059 Cough, unspecified: Secondary | ICD-10-CM

## 2020-12-10 DIAGNOSIS — N200 Calculus of kidney: Secondary | ICD-10-CM | POA: Diagnosis not present

## 2020-12-10 DIAGNOSIS — N185 Chronic kidney disease, stage 5: Secondary | ICD-10-CM

## 2020-12-10 DIAGNOSIS — E1122 Type 2 diabetes mellitus with diabetic chronic kidney disease: Secondary | ICD-10-CM

## 2020-12-10 DIAGNOSIS — R634 Abnormal weight loss: Secondary | ICD-10-CM | POA: Diagnosis not present

## 2020-12-10 DIAGNOSIS — E890 Postprocedural hypothyroidism: Secondary | ICD-10-CM

## 2020-12-10 DIAGNOSIS — K5730 Diverticulosis of large intestine without perforation or abscess without bleeding: Secondary | ICD-10-CM | POA: Diagnosis not present

## 2020-12-10 DIAGNOSIS — I700 Atherosclerosis of aorta: Secondary | ICD-10-CM | POA: Diagnosis not present

## 2020-12-10 DIAGNOSIS — I2510 Atherosclerotic heart disease of native coronary artery without angina pectoris: Secondary | ICD-10-CM | POA: Diagnosis not present

## 2020-12-10 DIAGNOSIS — N261 Atrophy of kidney (terminal): Secondary | ICD-10-CM | POA: Diagnosis not present

## 2020-12-10 LAB — POCT GLYCOSYLATED HEMOGLOBIN (HGB A1C): Hemoglobin A1C: 10.9 % — AB (ref 4.0–5.6)

## 2020-12-10 MED ORDER — LEVOTHYROXINE SODIUM 112 MCG PO TABS: 112.0000 ug | ORAL_TABLET | Freq: Every day | ORAL | 3 refills | Status: DC

## 2020-12-10 NOTE — Patient Instructions (Addendum)
check your blood sugar twice a day.  vary the time of day when you check, between before the 3 meals, and at bedtime.  also check if you have symptoms of your blood sugar being too high or too low.  please keep a record of the readings and bring it to your next appointment here.  You can write it on any piece of paper.  please call us sooner if your blood sugar goes below 70, or if you have a lot of readings over 200.  Please increase the Reg insulin to 40 units with breakfast. On this type of insulin schedule, you should eat meals on a regular schedule.  If a meal is missed or significantly delayed, your blood sugar could go low.   Please come back for a follow-up appointment in 2 months.

## 2020-12-10 NOTE — Progress Notes (Signed)
Subjective:    Patient ID: Sandy Roy, female    DOB: [birth year], 70 y.o.   MRN: [id number]  HPI Pt returns for f/u of diabetes mellitus: DM type: insulin-requiring type 2 Dx'ed: 9323 Complications: CAD and ESRD (transplant 2016).   Therapy: insulin since 2015.   GDM: never.   DKA: never.  Severe hypoglycemia: never.   Pancreatitis: never.   SDOH:  She no longer checks cbg's: she takes human insulin, due to cost.   Other: she has requested a QD, inexpensive insulin regimen; she is chronically on low-dose prednisone; the pattern of her cbg's indicates she needs R and N QAM, but no insulin later in the day.  NPH was stopped in ER.  fructosamine has confirmed A1c.  Interval history: she takes just 30 units QAM.   she brings a record of her cbg's which I have reviewed today.  CBG varies from 120-281.  It is in general highest in the afternoon, and lowest fasting.   pt had thyroidectomy for 2 small foci of papillary cancer (2008).  foci were not large enough to warrant RAI.  F/u US in 2021 showed small amount of residual thyroid tissue.   Past Medical History:  Diagnosis Date   Chronic kidney disease    ESRD-  Hemo- M_W_F   CONGESTIVE HEART FAILURE 12/14/2006   CORONARY ARTERY DISEASE 12/14/2006   Depression    not recently   DIABETES MELLITUS, TYPE II 12/14/2006   Type 2   HYPERLIPIDEMIA 12/14/2006   HYPERTENSION 12/14/2006   Hypoparathyroidism ([place]) 12/21/2006   Hypopotassemia 01/13/2007   HYPOTHYROIDISM, POSTSURGICAL 12/15/2006   NEOPLASM, MALIGNANT, THYROID GLAND 12/15/2006   Stage 1 papillary adenocarcinoma, one very small focus each lobe (66mm, 17mm)  6/09: CT neck: no evidence of recurrence   OBSTRUCTIVE SLEEP APNEA 02/23/2007   does not use a cpap   Orbital fracture (HCC)    left eye   Shortness of breath dyspnea    with exertion    Past Surgical History:  Procedure Laterality Date   [place] Right 02/07/2014   Procedure: FIRST STAGE BASILIC VEIN  TRANSPOSITION;  Surgeon: Conrad Gaylord, MD;  Location: [facility];  Service: Vascular;  Laterality: Right;   [place] Right 05/30/2014   Procedure: SECOND STAGE BASILIC VEIN TRANSPOSITION;  Surgeon: Conrad [place], MD;  Location: [facility];  Service: Vascular;  Laterality: Right;   BREAST SURGERY Left    nipple leaking, had surgery to fix the leak   CARDIAC CATHETERIZATION     ? 10 years ago   [place]  2008   TONSILLECTOMY     TUBAL LIGATION      Social History   Socioeconomic History   Marital status: Widowed    Spouse name: Not on file   Number of children: Not on file   Years of education: Not on file   Highest education level: Not on file  Occupational History   Not on file  Tobacco Use   Smoking status: Former    Types: Cigarettes   Smokeless tobacco: Never   Tobacco comments:    "quit years ago"  Vaping Use   Vaping Use: Never used  Substance and Sexual Activity   Alcohol use: No    Alcohol/week: 0.0 standard drinks   Drug use: No   Sexual activity: Not on file  Other Topics Concern   Not on file  Social History Narrative   Not on file   Social Determinants of Health   Financial Resource Strain: Not on file  Food Insecurity: Not on file  Transportation Needs: Not on file  Physical Activity: Not on file  Stress: Not on file  Social Connections: Not on file  Intimate Partner Violence: Not on file    Current Outpatient Medications on File Prior to Visit  Medication Sig Dispense Refill   apixaban (ELIQUIS) 5 MG TABS tablet Take 1 tablet (5 mg total) by mouth 2 (two) times daily. 60 tablet 5   atorvastatin (LIPITOR) 10 MG tablet Take 10 mg by mouth daily.     calcitRIOL (ROCALTROL) 0.5 MCG capsule Take 0.5 mcg by mouth daily.     CALCIUM PO Take by mouth.     cloNIDine (CATAPRES) 0.2 MG tablet Take 0.1 mg by mouth 2 (two) times daily.      fish oil-omega-3  fatty acids 1000 MG capsule Take 1 capsule by mouth daily.     furosemide (LASIX) 40 MG tablet 2 tabs in the am, 1 tab in the pm 90 tablet 1   glucose blood (ONETOUCH VERIO) test strip 1 each by Other route 2 (two) times daily. And lancets 2/day Dx code: E11.9 200 each 3   hydrALAZINE (APRESOLINE) 50 MG tablet Take 25 mg by mouth 3 (three) times daily.     insulin regular (HUMULIN R) 100 units/mL injection Inject 0.4 mLs (40 Units total) into the skin daily with breakfast. 40 mL 3   Insulin Syringe-Needle U-100 (B-D INSULIN SYRINGE) 31G X 5/16" 0.3 ML MISC Use 1 time per day. 100 each 1   labetalol (NORMODYNE) 100 MG tablet 2 in the morning 2 in the evening  2   Lancets (ONETOUCH ULTRASOFT) lancets Use to check blood sugar 2 times per day 100 each 2   lidocaine (LIDODERM) 5 % Place 3 patches onto the skin daily. Remove & Discard patch within 12 hours or as directed by MD     Multiple Vitamins-Minerals (MULTIVITAMIN WITH MINERALS) tablet Take 1 tablet by mouth daily.     mycophenolate (MYFORTIC) 180 MG EC tablet Take 180 mg by mouth 2 (two) times daily.     nitroGLYCERIN (NITROSTAT) 0.4 MG SL tablet Place 1 tablet (0.4 mg total) under the tongue every 5 (five) minutes as needed. 25 tablet 5   omeprazole (PRILOSEC) 20 MG capsule Take 20 mg by mouth daily.     potassium chloride SA (KLOR-CON) 20 MEQ tablet Take 1 tablet by mouth daily.     predniSONE (DELTASONE) 5 MG tablet Take 5 mg by mouth daily with breakfast.     sulfamethoxazole-trimethoprim (BACTRIM,SEPTRA) 400-80 MG per tablet 1 tablet. On Monday Wednesday and Friday     tacrolimus (PROGRAF) 1 MG capsule Take 5 mg by mouth. 6 tabs in the morning 6 tabs in the evening     triazolam (HALCION) 0.25 MG tablet 2 tablets by mouth at bedtime     No current facility-administered medications on file prior to visit.    Allergies  Allergen Reactions   Lisinopril Swelling    Family History  Problem Relation Age of Onset   Heart failure Mother     Diabetes Mother    Cancer Father    Cancer Other    COPD Other    Hypertension Other    Hyperlipidemia Other    Stroke Other    Heart disease Other  Diabetes Other     BP (!) 106/58 (BP Location: Left Arm, Patient Position: Sitting, Cuff Size: Normal)   Pulse 90   Ht 5\' 2"  (1.575 m)   Wt 178 lb 9.6 oz (81 kg)   SpO2 95%   BMI 32.67 kg/m    Review of Systems She denies hypoglycemia.      Objective:   Physical Exam Pulses: dorsalis pedis intact bilat.   MSK: no deformity of the feet.  CV: no leg edema.   Skin:  no ulcer on the feet.  normal color and temp on the feet. Neuro: sensation is intact to touch on the feet.    A1c=10.9%     Assessment & Plan:  Insulin-requiring type 2 DM: uncontrolled.     Patient Instructions  check your blood sugar twice a day.  vary the time of day when you check, between before the 3 meals, and at bedtime.  also check if you have symptoms of your blood sugar being too high or too low.  please keep a record of the readings and bring it to your next appointment here.  You can write it on any piece of paper.  please call us sooner if your blood sugar goes below 70, or if you have a lot of readings over 200.  Please increase the Reg insulin to 40 units with breakfast. On this type of insulin schedule, you should eat meals on a regular schedule.  If a meal is missed or significantly delayed, your blood sugar could go low.   Please come back for a follow-up appointment in 2 months.

## 2020-12-14 DIAGNOSIS — Z23 Encounter for immunization: Secondary | ICD-10-CM | POA: Diagnosis not present

## 2020-12-14 DIAGNOSIS — M67911 Unspecified disorder of synovium and tendon, right shoulder: Secondary | ICD-10-CM | POA: Diagnosis not present

## 2020-12-21 DIAGNOSIS — Z940 Kidney transplant status: Secondary | ICD-10-CM | POA: Diagnosis not present

## 2020-12-24 DIAGNOSIS — J3800 Paralysis of vocal cords and larynx, unspecified: Secondary | ICD-10-CM | POA: Diagnosis not present

## 2020-12-24 DIAGNOSIS — R490 Dysphonia: Secondary | ICD-10-CM | POA: Diagnosis not present

## 2021-01-08 DIAGNOSIS — B0229 Other postherpetic nervous system involvement: Secondary | ICD-10-CM | POA: Diagnosis not present

## 2021-01-08 DIAGNOSIS — F32A Depression, unspecified: Secondary | ICD-10-CM | POA: Diagnosis not present

## 2021-01-08 DIAGNOSIS — M150 Primary generalized (osteo)arthritis: Secondary | ICD-10-CM | POA: Diagnosis not present

## 2021-01-08 DIAGNOSIS — G894 Chronic pain syndrome: Secondary | ICD-10-CM | POA: Diagnosis not present

## 2021-01-14 DIAGNOSIS — E119 Type 2 diabetes mellitus without complications: Secondary | ICD-10-CM | POA: Diagnosis not present

## 2021-01-14 DIAGNOSIS — M7989 Other specified soft tissue disorders: Secondary | ICD-10-CM | POA: Diagnosis not present

## 2021-01-14 DIAGNOSIS — Z940 Kidney transplant status: Secondary | ICD-10-CM | POA: Diagnosis not present

## 2021-01-14 DIAGNOSIS — I4891 Unspecified atrial fibrillation: Secondary | ICD-10-CM | POA: Diagnosis not present

## 2021-01-14 DIAGNOSIS — I10 Essential (primary) hypertension: Secondary | ICD-10-CM | POA: Diagnosis not present

## 2021-01-22 DIAGNOSIS — R9389 Abnormal findings on diagnostic imaging of other specified body structures: Secondary | ICD-10-CM | POA: Diagnosis not present

## 2021-01-23 DIAGNOSIS — Z940 Kidney transplant status: Secondary | ICD-10-CM | POA: Diagnosis not present

## 2021-01-23 DIAGNOSIS — M24811 Other specific joint derangements of right shoulder, not elsewhere classified: Secondary | ICD-10-CM | POA: Diagnosis not present

## 2021-02-04 DIAGNOSIS — K648 Other hemorrhoids: Secondary | ICD-10-CM | POA: Diagnosis not present

## 2021-02-04 DIAGNOSIS — K5732 Diverticulitis of large intestine without perforation or abscess without bleeding: Secondary | ICD-10-CM | POA: Diagnosis not present

## 2021-02-04 DIAGNOSIS — D125 Benign neoplasm of sigmoid colon: Secondary | ICD-10-CM | POA: Diagnosis not present

## 2021-02-04 DIAGNOSIS — K5730 Diverticulosis of large intestine without perforation or abscess without bleeding: Secondary | ICD-10-CM | POA: Diagnosis not present

## 2021-02-04 DIAGNOSIS — R933 Abnormal findings on diagnostic imaging of other parts of digestive tract: Secondary | ICD-10-CM | POA: Diagnosis not present

## 2021-02-06 DIAGNOSIS — D125 Benign neoplasm of sigmoid colon: Secondary | ICD-10-CM | POA: Diagnosis not present

## 2021-02-12 ENCOUNTER — Other Ambulatory Visit: Payer: Self-pay

## 2021-02-12 ENCOUNTER — Ambulatory Visit (INDEPENDENT_AMBULATORY_CARE_PROVIDER_SITE_OTHER): Payer: Medicare Other | Admitting: Endocrinology

## 2021-02-12 VITALS — BP 100/40 | HR 73 | Ht 62.0 in | Wt 181.8 lb

## 2021-02-12 DIAGNOSIS — N185 Chronic kidney disease, stage 5: Secondary | ICD-10-CM

## 2021-02-12 DIAGNOSIS — Z794 Long term (current) use of insulin: Secondary | ICD-10-CM

## 2021-02-12 DIAGNOSIS — E1122 Type 2 diabetes mellitus with diabetic chronic kidney disease: Secondary | ICD-10-CM

## 2021-02-12 LAB — POCT GLYCOSYLATED HEMOGLOBIN (HGB A1C): Hemoglobin A1C: 9.2 % — AB (ref 4.0–5.6)

## 2021-02-12 MED ORDER — FREESTYLE LIBRE 2 READER DEVI: 1.0000 | Freq: Once | 1 refills | Status: AC

## 2021-02-12 MED ORDER — FREESTYLE LIBRE 2 SENSOR MISC: 1.0000 | 3 refills | Status: DC

## 2021-02-12 MED ORDER — INSULIN REGULAR HUMAN 100 UNIT/ML IJ SOLN: 45.0000 [IU] | Freq: Every day | INTRAMUSCULAR | 3 refills | Status: DC

## 2021-02-12 NOTE — Progress Notes (Signed)
Subjective:    Patient ID: Sandy Roy, female    DOB: [birth year], 70 y.o.   MRN: [id number]  HPI Pt returns for f/u of diabetes mellitus: DM type: insulin-requiring type 2 Dx'ed: 1856 Complications: CAD and ESRD (transplant 2016).   Therapy: insulin since 2015.   GDM: never.   DKA: never.  Severe hypoglycemia: never.   Pancreatitis: never.   SDOH:  She no longer checks cbg's: she takes human insulin, due to cost.   Other: she has requested a QD, inexpensive insulin regimen; she is chronically on low-dose prednisone; the pattern of her cbg's indicates she needs Reg QAM, but no insulin later in the day.  NPH was stopped in ER.  fructosamine has confirmed A1c.  Interval history: no cbg record, but states cbg varies from 51-300.  It is in general highest in the afternoon, and lowest fasting.  She has hypoglycemia approx twice per month.   pt had thyroidectomy for 2 small foci of papillary cancer (2008).  foci were not large enough to warrant RAI.  F/u US in 2021 showed small amount of residual thyroid tissue.   Past Medical History:  Diagnosis Date   Chronic kidney disease    ESRD-  Hemo- M_W_F   CONGESTIVE HEART FAILURE 12/14/2006   CORONARY ARTERY DISEASE 12/14/2006   Depression    not recently   DIABETES MELLITUS, TYPE II 12/14/2006   Type 2   HYPERLIPIDEMIA 12/14/2006   HYPERTENSION 12/14/2006   Hypoparathyroidism ([place]) 12/21/2006   Hypopotassemia 01/13/2007   HYPOTHYROIDISM, POSTSURGICAL 12/15/2006   NEOPLASM, MALIGNANT, THYROID GLAND 12/15/2006   Stage 1 papillary adenocarcinoma, one very small focus each lobe (11mm, 77mm)  6/09: CT neck: no evidence of recurrence   OBSTRUCTIVE SLEEP APNEA 02/23/2007   does not use a cpap   Orbital fracture (HCC)    left eye   Shortness of breath dyspnea    with exertion    Past Surgical History:  Procedure Laterality Date   [place] Right 02/07/2014   Procedure: FIRST STAGE BASILIC VEIN TRANSPOSITION;  Surgeon: Conrad Bristow, MD;  Location: [facility];  Service: Vascular;  Laterality: Right;   [place] Right 05/30/2014   Procedure: SECOND STAGE BASILIC VEIN TRANSPOSITION;  Surgeon: Conrad [place], MD;  Location: [facility];  Service: Vascular;  Laterality: Right;   BREAST SURGERY Left    nipple leaking, had surgery to fix the leak   CARDIAC CATHETERIZATION     ? 10 years ago   [place]  2008   TONSILLECTOMY     TUBAL LIGATION      Social History   Socioeconomic History   Marital status: Widowed    Spouse name: Not on file   Number of children: Not on file   Years of education: Not on file   Highest education level: Not on file  Occupational History   Not on file  Tobacco Use   Smoking status: Former    Types: Cigarettes   Smokeless tobacco: Never   Tobacco comments:    "quit years ago"  Vaping Use   Vaping Use: Never used  Substance and Sexual Activity   Alcohol use: No    Alcohol/week: 0.0 standard drinks   Drug use: No   Sexual activity: Not on file  Other Topics Concern   Not on file  Social  History Narrative   Not on file   Social Determinants of Health   Financial Resource Strain: Not on file  Food Insecurity: Not on file  Transportation Needs: Not on file  Physical Activity: Not on file  Stress: Not on file  Social Connections: Not on file  Intimate Partner Violence: Not on file    Current Outpatient Medications on File Prior to Visit  Medication Sig Dispense Refill   apixaban (ELIQUIS) 5 MG TABS tablet Take 1 tablet (5 mg total) by mouth 2 (two) times daily. 60 tablet 5   atorvastatin (LIPITOR) 10 MG tablet Take 10 mg by mouth daily.     calcitRIOL (ROCALTROL) 0.5 MCG capsule Take 0.5 mcg by mouth daily.     CALCIUM PO Take by mouth.     cloNIDine (CATAPRES) 0.2 MG tablet Take 0.1 mg by mouth 2 (two) times daily.      fish oil-omega-3 fatty acids 1000 MG capsule Take 1  capsule by mouth daily.     furosemide (LASIX) 40 MG tablet 2 tabs in the am, 1 tab in the pm 90 tablet 1   glucose blood (ONETOUCH VERIO) test strip 1 each by Other route 2 (two) times daily. And lancets 2/day Dx code: E11.9 200 each 3   hydrALAZINE (APRESOLINE) 50 MG tablet Take 25 mg by mouth 3 (three) times daily.     Insulin Syringe-Needle U-100 (B-D INSULIN SYRINGE) 31G X 5/16" 0.3 ML MISC Use 1 time per day. 100 each 1   labetalol (NORMODYNE) 100 MG tablet 2 in the morning 2 in the evening  2   Lancets (ONETOUCH ULTRASOFT) lancets Use to check blood sugar 2 times per day 100 each 2   levothyroxine (SYNTHROID) 112 MCG tablet Take 1 tablet (112 mcg total) by mouth daily. 90 tablet 3   lidocaine (LIDODERM) 5 % Place 3 patches onto the skin daily. Remove & Discard patch within 12 hours or as directed by MD     Multiple Vitamins-Minerals (MULTIVITAMIN WITH MINERALS) tablet Take 1 tablet by mouth daily.     mycophenolate (MYFORTIC) 180 MG EC tablet Take 180 mg by mouth 2 (two) times daily.     nitroGLYCERIN (NITROSTAT) 0.4 MG SL tablet Place 1 tablet (0.4 mg total) under the tongue every 5 (five) minutes as needed. 25 tablet 5   omeprazole (PRILOSEC) 20 MG capsule Take 20 mg by mouth daily.     potassium chloride SA (KLOR-CON) 20 MEQ tablet Take 1 tablet by mouth daily.     predniSONE (DELTASONE) 5 MG tablet Take 5 mg by mouth daily with breakfast.     sulfamethoxazole-trimethoprim (BACTRIM,SEPTRA) 400-80 MG per tablet 1 tablet. On Monday Wednesday and Friday     tacrolimus (PROGRAF) 1 MG capsule Take 5 mg by mouth. 6 tabs in the morning 6 tabs in the evening     triazolam (HALCION) 0.25 MG tablet 2 tablets by mouth at bedtime     No current facility-administered medications on file prior to visit.    Allergies  Allergen Reactions   Lisinopril Swelling    Family History  Problem Relation Age of Onset   Heart failure Mother    Diabetes Mother    Cancer Father    Cancer Other    COPD  Other    Hypertension Other    Hyperlipidemia Other    Stroke Other    Heart disease Other    Diabetes Other     BP (!) 100/40   Pulse  73   Ht 5\' 2"  (1.575 m)   Wt 181 lb 12.8 oz (82.5 kg)   SpO2 97%   BMI 33.25 kg/m    Review of Systems     Objective:   Physical Exam  Lab Results  Component Value Date   CREATININE 1.36 (H) 12/14/2018   BUN 22 12/14/2018   NA 140 12/14/2018   K 4.0 12/14/2018   CL 99 12/14/2018   CO2 30 12/14/2018    Lab Results  Component Value Date   HGBA1C 9.2 (A) 02/12/2021      Assessment & Plan:  Insulin-requiring type 2 DM: uncontrolled.  Hypoglycemia:  We discussed.  she agrees to use continuous glucose monitor.    Patient Instructions  check your blood sugar twice a day.  vary the time of day when you check, between before the 3 meals, and at bedtime.  also check if you have symptoms of your blood sugar being too high or too low.  please keep a record of the readings and bring it to your next appointment here.  You can write it on any piece of paper.  please call us sooner if your blood sugar goes below 70, or if you have a lot of readings over 200.  Please increase the Reg insulin to 40 units with breakfast.  On this type of insulin schedule, you should eat meals on a regular schedule.  If a meal is missed or significantly delayed, your blood sugar could go low.   I have sent a prescription to your pharmacy, for the continuous glucose monitor sensors and reader.  Please call if you need help with this.   Please come back for a follow-up appointment in 2 months.

## 2021-02-12 NOTE — Patient Instructions (Addendum)
check your blood sugar twice a day.  vary the time of day when you check, between before the 3 meals, and at bedtime.  also check if you have symptoms of your blood sugar being too high or too low.  please keep a record of the readings and bring it to your next appointment here.  You can write it on any piece of paper.  please call us sooner if your blood sugar goes below 70, or if you have a lot of readings over 200.  Please increase the Reg insulin to 40 units with breakfast.  On this type of insulin schedule, you should eat meals on a regular schedule.  If a meal is missed or significantly delayed, your blood sugar could go low.   I have sent a prescription to your pharmacy, for the continuous glucose monitor sensors and reader.  Please call if you need help with this.   Please come back for a follow-up appointment in 2 months.

## 2021-03-20 DIAGNOSIS — R109 Unspecified abdominal pain: Secondary | ICD-10-CM | POA: Diagnosis not present

## 2021-03-20 DIAGNOSIS — Z794 Long term (current) use of insulin: Secondary | ICD-10-CM | POA: Diagnosis not present

## 2021-03-20 DIAGNOSIS — I700 Atherosclerosis of aorta: Secondary | ICD-10-CM | POA: Diagnosis not present

## 2021-03-20 DIAGNOSIS — N186 End stage renal disease: Secondary | ICD-10-CM | POA: Diagnosis not present

## 2021-03-20 DIAGNOSIS — M545 Low back pain, unspecified: Secondary | ICD-10-CM | POA: Diagnosis not present

## 2021-03-20 DIAGNOSIS — R634 Abnormal weight loss: Secondary | ICD-10-CM | POA: Diagnosis not present

## 2021-03-20 DIAGNOSIS — I672 Cerebral atherosclerosis: Secondary | ICD-10-CM | POA: Diagnosis not present

## 2021-03-20 DIAGNOSIS — K5732 Diverticulitis of large intestine without perforation or abscess without bleeding: Secondary | ICD-10-CM | POA: Diagnosis not present

## 2021-03-20 DIAGNOSIS — Z91199 Patient's noncompliance with other medical treatment and regimen due to unspecified reason: Secondary | ICD-10-CM | POA: Diagnosis not present

## 2021-03-20 DIAGNOSIS — N179 Acute kidney failure, unspecified: Secondary | ICD-10-CM | POA: Diagnosis not present

## 2021-03-20 DIAGNOSIS — N200 Calculus of kidney: Secondary | ICD-10-CM | POA: Diagnosis not present

## 2021-03-20 DIAGNOSIS — E872 Acidosis, unspecified: Secondary | ICD-10-CM | POA: Diagnosis not present

## 2021-03-20 DIAGNOSIS — N2889 Other specified disorders of kidney and ureter: Secondary | ICD-10-CM | POA: Diagnosis not present

## 2021-03-20 DIAGNOSIS — I444 Left anterior fascicular block: Secondary | ICD-10-CM | POA: Diagnosis not present

## 2021-03-20 DIAGNOSIS — E1165 Type 2 diabetes mellitus with hyperglycemia: Secondary | ICD-10-CM | POA: Diagnosis not present

## 2021-03-20 DIAGNOSIS — I517 Cardiomegaly: Secondary | ICD-10-CM | POA: Diagnosis not present

## 2021-03-20 DIAGNOSIS — U071 COVID-19: Secondary | ICD-10-CM | POA: Diagnosis not present

## 2021-03-20 DIAGNOSIS — J9601 Acute respiratory failure with hypoxia: Secondary | ICD-10-CM | POA: Diagnosis not present

## 2021-03-20 DIAGNOSIS — D849 Immunodeficiency, unspecified: Secondary | ICD-10-CM | POA: Diagnosis not present

## 2021-03-20 DIAGNOSIS — E785 Hyperlipidemia, unspecified: Secondary | ICD-10-CM | POA: Diagnosis not present

## 2021-03-20 DIAGNOSIS — N183 Chronic kidney disease, stage 3 unspecified: Secondary | ICD-10-CM | POA: Diagnosis not present

## 2021-03-20 DIAGNOSIS — Z992 Dependence on renal dialysis: Secondary | ICD-10-CM | POA: Diagnosis not present

## 2021-03-20 DIAGNOSIS — N185 Chronic kidney disease, stage 5: Secondary | ICD-10-CM | POA: Diagnosis not present

## 2021-03-20 DIAGNOSIS — Z940 Kidney transplant status: Secondary | ICD-10-CM | POA: Diagnosis not present

## 2021-03-20 DIAGNOSIS — R110 Nausea: Secondary | ICD-10-CM | POA: Diagnosis not present

## 2021-03-20 DIAGNOSIS — M546 Pain in thoracic spine: Secondary | ICD-10-CM | POA: Diagnosis not present

## 2021-03-20 DIAGNOSIS — Z87891 Personal history of nicotine dependence: Secondary | ICD-10-CM | POA: Diagnosis not present

## 2021-03-20 DIAGNOSIS — Z7901 Long term (current) use of anticoagulants: Secondary | ICD-10-CM | POA: Diagnosis not present

## 2021-03-20 DIAGNOSIS — I120 Hypertensive chronic kidney disease with stage 5 chronic kidney disease or end stage renal disease: Secondary | ICD-10-CM | POA: Diagnosis not present

## 2021-03-20 DIAGNOSIS — R197 Diarrhea, unspecified: Secondary | ICD-10-CM | POA: Diagnosis not present

## 2021-03-20 DIAGNOSIS — K219 Gastro-esophageal reflux disease without esophagitis: Secondary | ICD-10-CM | POA: Diagnosis not present

## 2021-03-20 DIAGNOSIS — I4892 Unspecified atrial flutter: Secondary | ICD-10-CM | POA: Diagnosis not present

## 2021-03-20 DIAGNOSIS — I4891 Unspecified atrial fibrillation: Secondary | ICD-10-CM | POA: Diagnosis not present

## 2021-03-20 DIAGNOSIS — D649 Anemia, unspecified: Secondary | ICD-10-CM | POA: Diagnosis not present

## 2021-03-20 DIAGNOSIS — R100 Acute abdomen: Secondary | ICD-10-CM | POA: Diagnosis not present

## 2021-03-20 DIAGNOSIS — E039 Hypothyroidism, unspecified: Secondary | ICD-10-CM | POA: Diagnosis not present

## 2021-03-20 DIAGNOSIS — E1122 Type 2 diabetes mellitus with diabetic chronic kidney disease: Secondary | ICD-10-CM | POA: Diagnosis not present

## 2021-03-20 DIAGNOSIS — N189 Chronic kidney disease, unspecified: Secondary | ICD-10-CM | POA: Diagnosis not present

## 2021-03-20 DIAGNOSIS — I10 Essential (primary) hypertension: Secondary | ICD-10-CM | POA: Diagnosis not present

## 2021-03-20 DIAGNOSIS — E119 Type 2 diabetes mellitus without complications: Secondary | ICD-10-CM | POA: Diagnosis not present

## 2021-03-20 DIAGNOSIS — G8929 Other chronic pain: Secondary | ICD-10-CM | POA: Diagnosis not present

## 2021-03-20 DIAGNOSIS — I2510 Atherosclerotic heart disease of native coronary artery without angina pectoris: Secondary | ICD-10-CM | POA: Diagnosis not present

## 2021-03-20 DIAGNOSIS — R7989 Other specified abnormal findings of blood chemistry: Secondary | ICD-10-CM | POA: Diagnosis not present

## 2021-03-20 DIAGNOSIS — E1100 Type 2 diabetes mellitus with hyperosmolarity without nonketotic hyperglycemic-hyperosmolar coma (NKHHC): Secondary | ICD-10-CM | POA: Diagnosis not present

## 2021-03-21 DIAGNOSIS — N179 Acute kidney failure, unspecified: Secondary | ICD-10-CM | POA: Diagnosis not present

## 2021-03-21 DIAGNOSIS — E119 Type 2 diabetes mellitus without complications: Secondary | ICD-10-CM | POA: Diagnosis not present

## 2021-03-21 DIAGNOSIS — U071 COVID-19: Secondary | ICD-10-CM | POA: Diagnosis not present

## 2021-03-21 DIAGNOSIS — I672 Cerebral atherosclerosis: Secondary | ICD-10-CM | POA: Diagnosis not present

## 2021-03-21 DIAGNOSIS — I4892 Unspecified atrial flutter: Secondary | ICD-10-CM | POA: Diagnosis not present

## 2021-03-21 DIAGNOSIS — I444 Left anterior fascicular block: Secondary | ICD-10-CM | POA: Diagnosis not present

## 2021-03-21 DIAGNOSIS — I10 Essential (primary) hypertension: Secondary | ICD-10-CM | POA: Diagnosis not present

## 2021-03-21 DIAGNOSIS — Z940 Kidney transplant status: Secondary | ICD-10-CM | POA: Diagnosis not present

## 2021-03-21 DIAGNOSIS — N186 End stage renal disease: Secondary | ICD-10-CM | POA: Diagnosis not present

## 2021-03-22 DIAGNOSIS — E119 Type 2 diabetes mellitus without complications: Secondary | ICD-10-CM | POA: Diagnosis not present

## 2021-03-22 DIAGNOSIS — M546 Pain in thoracic spine: Secondary | ICD-10-CM | POA: Diagnosis not present

## 2021-03-22 DIAGNOSIS — I10 Essential (primary) hypertension: Secondary | ICD-10-CM | POA: Diagnosis not present

## 2021-03-22 DIAGNOSIS — U071 COVID-19: Secondary | ICD-10-CM | POA: Diagnosis not present

## 2021-03-22 DIAGNOSIS — N186 End stage renal disease: Secondary | ICD-10-CM | POA: Diagnosis not present

## 2021-03-22 DIAGNOSIS — M545 Low back pain, unspecified: Secondary | ICD-10-CM | POA: Diagnosis not present

## 2021-03-28 DIAGNOSIS — U071 COVID-19: Secondary | ICD-10-CM | POA: Diagnosis not present

## 2021-03-28 DIAGNOSIS — N179 Acute kidney failure, unspecified: Secondary | ICD-10-CM | POA: Diagnosis not present

## 2021-03-28 DIAGNOSIS — E872 Acidosis, unspecified: Secondary | ICD-10-CM | POA: Diagnosis not present

## 2021-03-28 DIAGNOSIS — N186 End stage renal disease: Secondary | ICD-10-CM | POA: Diagnosis not present

## 2021-03-28 DIAGNOSIS — E1122 Type 2 diabetes mellitus with diabetic chronic kidney disease: Secondary | ICD-10-CM | POA: Diagnosis not present

## 2021-03-28 DIAGNOSIS — I4891 Unspecified atrial fibrillation: Secondary | ICD-10-CM | POA: Diagnosis not present

## 2021-03-28 DIAGNOSIS — R109 Unspecified abdominal pain: Secondary | ICD-10-CM | POA: Diagnosis not present

## 2021-03-28 DIAGNOSIS — Z992 Dependence on renal dialysis: Secondary | ICD-10-CM | POA: Diagnosis not present

## 2021-04-15 ENCOUNTER — Ambulatory Visit: Payer: Medicare Other | Admitting: Student

## 2021-04-15 ENCOUNTER — Other Ambulatory Visit: Payer: Self-pay

## 2021-04-15 ENCOUNTER — Ambulatory Visit: Payer: Medicare Other | Admitting: Cardiology

## 2021-04-15 ENCOUNTER — Encounter: Payer: Self-pay | Admitting: Student

## 2021-04-15 VITALS — BP 126/64 | HR 68 | Temp 97.9°F | Ht 62.0 in | Wt 171.0 lb

## 2021-04-15 DIAGNOSIS — I10 Essential (primary) hypertension: Secondary | ICD-10-CM

## 2021-04-15 DIAGNOSIS — I480 Paroxysmal atrial fibrillation: Secondary | ICD-10-CM | POA: Diagnosis not present

## 2021-04-15 DIAGNOSIS — G894 Chronic pain syndrome: Secondary | ICD-10-CM | POA: Diagnosis not present

## 2021-04-15 DIAGNOSIS — I2510 Atherosclerotic heart disease of native coronary artery without angina pectoris: Secondary | ICD-10-CM

## 2021-04-15 DIAGNOSIS — F32A Depression, unspecified: Secondary | ICD-10-CM | POA: Diagnosis not present

## 2021-04-15 DIAGNOSIS — M150 Primary generalized (osteo)arthritis: Secondary | ICD-10-CM | POA: Diagnosis not present

## 2021-04-15 DIAGNOSIS — B0229 Other postherpetic nervous system involvement: Secondary | ICD-10-CM | POA: Diagnosis not present

## 2021-04-15 NOTE — Progress Notes (Signed)
Patient referred by Antony Contras, MD for atrial flutter  Subjective:   Sandy Roy, female    DOB: 06-19-1950, 71 y.o.   MRN: [id number]   Chief Complaint  Patient presents with   Atrial Fibrillation   Follow-up     HPI  71 y.o. African American female with CAD, CKD s/p kidney transplant, hypertension, hyperlipidemia, type 2 DM, persistent Afib/flutter  Patient presents for 6-monthfollow-up of CAD and atrial fibrillation.  I personally reviewed external labs, A1c as of 03/20/2021 was 11.5%.  This was in our office Dr. PVirgina Jockhad stopped aspirin given patient's ongoing use of Eliquis and increased creatinine.   [facility] with hyperglycemia, AKI, COVID-19 infection, atrial flutter with RVR and elevated troponins.  I extensively reviewed external records including labs, radiology results, EKG results and notes from inpatient providers.  On my review does not appear repeat echocardiogram was done.  On further questioning patient states that she had not been taking any of her medications for 2 weeks prior to admission.  Patient states she felt fatigued and weak which is what finally prompted her to seek evaluation.  Patient's home medications were resumed during hospitalization and she recovered well.  Patient was subsequently discharged and states she has been feeling well since then.  She reports medication compliance since discharge as well.  Current Outpatient Medications on File Prior to Visit  Medication Sig Dispense Refill   apixaban (ELIQUIS) 5 MG TABS tablet Take 1 tablet (5 mg total) by mouth 2 (two) times daily. 60 tablet 5   atorvastatin (LIPITOR) 10 MG tablet Take 10 mg by mouth daily.     calcitRIOL (ROCALTROL) 0.5 MCG capsule Take 0.5 mcg by mouth as directed. M/w/f     CALCIUM PO Take by mouth.     Continuous Blood Gluc Sensor (FREESTYLE LIBRE 2 SENSOR) MISC 1 Device by Does not apply route every 14  (fourteen) days. 6 each 3   fish oil-omega-3 fatty acids 1000 MG capsule Take 1 capsule by mouth daily.     furosemide (LASIX) 40 MG tablet 2 tabs in the am, 1 tab in the pm (Patient taking differently: Take 20 mg by mouth 2 (two) times daily.) 90 tablet 1   glucose blood (ONETOUCH VERIO) test strip 1 each by Other route 2 (two) times daily. And lancets 2/day Dx code: E11.9 200 each 3   hydrALAZINE (APRESOLINE) 50 MG tablet Take 25 mg by mouth 3 (three) times daily.     insulin glargine (LANTUS) 100 UNIT/ML injection Inject 15 Units into the skin daily.     Insulin Syringe-Needle U-100 (B-D INSULIN SYRINGE) 31G X 5/16" 0.3 ML MISC Use 1 time per day. 100 each 1   labetalol (NORMODYNE) 100 MG tablet 2 in the morning 2 in the evening  2   Lancets (ONETOUCH ULTRASOFT) lancets Use to check blood sugar 2 times per day 100 each 2   levothyroxine (SYNTHROID) 112 MCG tablet Take 1 tablet (112 mcg total) by mouth daily. 90 tablet 3   lidocaine (LIDODERM) 5 % Place 3 patches onto the skin daily. Remove & Discard patch within 12 hours or as directed by MD     Multiple Vitamins-Minerals (MULTIVITAMIN WITH MINERALS) tablet Take 1 tablet by mouth daily.     mycophenolate (MYFORTIC) 180 MG EC tablet Take 180 mg by mouth 2 (two) times daily.     nitroGLYCERIN (NITROSTAT) 0.4 MG SL tablet Place 1  tablet (0.4 mg total) under the tongue every 5 (five) minutes as needed. 25 tablet 5   omeprazole (PRILOSEC) 20 MG capsule Take 20 mg by mouth daily.     potassium chloride SA (KLOR-CON) 20 MEQ tablet Take 1 tablet by mouth daily.     predniSONE (DELTASONE) 5 MG tablet Take 5 mg by mouth daily with breakfast.     sulfamethoxazole-trimethoprim (BACTRIM,SEPTRA) 400-80 MG per tablet 1 tablet. On Monday Wednesday and Friday     tacrolimus (PROGRAF) 1 MG capsule Take 4 mg by mouth. 6 tabs in the morning 6 tabs in the evening     triazolam (HALCION) 0.25 MG tablet 2 tablets by mouth at bedtime     cloNIDine (CATAPRES) 0.2 MG  tablet Take 0.1 mg by mouth 2 (two) times daily.      No current facility-administered medications on file prior to visit.    Cardiovascular and other pertinent studies: EKG 04/15/2021:  Atrial flutter with variable conduction at a rate of 73 bpm.  EKG 10/10/2020: Atrial flutter with variable AV conduction Controlled ventricular rate Nonspecific T-abnormality  Echocardiogram 10/02/2020:  Normal LV systolic function with visual EF 60-65%. Left ventricle cavity  is normal in size. Mild to moderate left ventricular hypertrophy. Normal  global wall motion. Unable to evaluate diastolic function due to atrial  flutter. Elevated LAP.  Left atrial cavity is moderately dilated.  Mild to moderate mitral regurgitation.  Mild tricuspid regurgitation.  Mild pulmonic regurgitation.  Small pericardial effusion. There is no hemodynamic significance.  Compared to study 12/26/2019 no significant change.   Mobile cardiac telemetry 14 days 02/06/2020 - 02/20/2020:  Dominant rhythm: Atrial flutter/fibrillation  HR 48-144 bpm. Avg HR 75 bpm.  Afib/flutter burden 64%  Afib/flutter is asymptomatic.  Multiple episodes of atrial tachycardia, fastest at 133 bpm for 4 beats, longest for 7 beats at 118 bpm.  1.7% isolated SVE, <1% couplet/triplets.  <1% isolated VE, couplet/triplets.  No VT/high grade AV block, sinus pause >3sec noted.  1 patient triggered event, correlated with supraventricular ectopy.   Coronary angiogram 2012: 1. Abnormal left ventricular function with anteroapical defect compatible     with a previous infarction.  2. Diffusely narrowed and small distal LAD but no significant focal     obstructive stenoses noted.  3. Occlusion of the right coronary artery with collaterals from the left     coronary system.  EKG 02/06/2020: Wandering atrial pacemaker at the rate of 64 bpm.  Normal axis.  Poor R wave progression, cannot exclude anteroseptal infarct old.  Nonspecific T abnormality.     EKG 01/06/2020: Sinus rhythm 67 bpm with rate variation  Right atrial enlargement Baseline artifact  Recent labs: 03/28/2021: Sodium 138, potassium 4.9, BUN 35, creatinine 1.54, GFR 36 Hgb 10.3, HCT 31.8, platelets 216, MCV 93 Magnesium 1.8 BMP 544 Troponin 290, 299, 264 A1c 11.5%  10/09/2020: Glucose 306, BUN/Cr 39/2.5. EGFR 20. Na/K 137/4.5. Rest of the CMP normal H/H 12/38. MCV 93. Platelets 304 HbA1C 11.8% Chol 220, TG 194, HDL 70, LDL 109 Mag 1.5  02/01/2020: Glucose 138, BUN/Cr 40/1.7. EGFR 28. HbA1C 8.6% Chol 230, TG 315, HDL 58, LDL N/A  12/08/2019: Glucose 194, BUN/Cr 20/1.41. EGFR 43. Na/K 142/4.4. Rest of the CMP normal H/H 10/32. MCV 91. Platelets 355 Iron 24 mcg/dl (27-139) Iron sat 8% (15-55%)  04/2019: TSH 4.2 normal    Review of Systems  Cardiovascular:  Negative for chest pain, dyspnea on exertion, leg swelling, palpitations and syncope.  Vitals:   04/15/21 0955  BP: 126/64  Pulse: 68  Temp: 97.9 F (36.6 C)  SpO2: 98%     Body mass index is 31.28 kg/m. Filed Weights   04/15/21 0955  Weight: 171 lb (77.6 kg)     Objective:   Physical Exam Vitals and nursing note reviewed.  Constitutional:      General: She is not in acute distress. Neck:     Vascular: No JVD.  Cardiovascular:     Rate and Rhythm: Normal rate. Rhythm irregular.     Heart sounds: Normal heart sounds. No murmur heard. Pulmonary:     Effort: Pulmonary effort is normal.     Breath sounds: Normal breath sounds. No wheezing or rales.  Musculoskeletal:     Right lower leg: Edema (Trace) present.     Left lower leg: Edema (trace) present.        Assessment & Recommendations:   71 y.o. African American female with CAD, CKD s/p kidney transplant, hypertension, hyperlipidemia, type 2 DM, persistent Afib/flutter  Paroxysmal Afib/ persistent atrial flutter: This patients CHA2DS2-VASc Score 5 (HTN, DM, Vasc, age, F)and yearly risk of stroke 7.2%.  She  continues to tolerate Eliquis without bleeding diathesis, will continue this  Patient remains resistant to undergoing sleep study or wearing CPAP Patient has typically been followed with Dr. Randa Lynn and shared decision has been to continue with rate control strategy and risk factor modification including obesity and OSA Now the patient is back on her cardiac medications she is well rate controlled on EKG today.  Suspect episode of RVR was because patient had independently discontinued all of her medications both cardiac and noncardiac. Suspect troponin elevation during recent admission to be related to A-fib with RVR.  However given troponin elevation and multiple cardiovascular risk factors discussed with patient undergoing repeat echocardiogram as well as further ischemic evaluation.  Discussed the benefits of further evaluation, however patient does not wish to undergo further cardiac testing at this time.  Patient acknowledges the benefits of doing so as well as the risks of not proceeding with further evaluation, she opts to continue with watchful waiting. Continue Eliquis and labetalol  CAD: Known RCA CTO with left-to-right collaterals on coronary angiogram 2012. Due to Eliquis use have discontinued aspirin, therefore she is not presently on it Continue statin therapy  Hypertension: Blood pressure is well controlled at today's office visit. Continue clonidine, hydralazine, labetalol   Discussed at length with patient regarding the importance of diabetes control.  Last A1c was 11.3%, I personally reviewed external records.  Encourage patient to follow closely with PCP as well as consider endocrinology referral for further management.  Follow-up in 3 months, sooner if needed.   Alethia Berthold, PA-C 04/15/2021, 1:29 PM Office: [phone]

## 2021-05-02 ENCOUNTER — Other Ambulatory Visit: Payer: Self-pay | Admitting: Endocrinology

## 2021-05-13 ENCOUNTER — Other Ambulatory Visit: Payer: Self-pay

## 2021-05-13 ENCOUNTER — Ambulatory Visit: Payer: Medicare Other | Admitting: Endocrinology

## 2021-05-13 ENCOUNTER — Telehealth: Payer: Self-pay

## 2021-05-13 ENCOUNTER — Encounter: Payer: Medicare Other | Attending: Endocrinology | Admitting: Nutrition

## 2021-05-13 VITALS — BP 170/78 | HR 82 | Ht 62.0 in | Wt 173.0 lb

## 2021-05-13 DIAGNOSIS — Z794 Long term (current) use of insulin: Secondary | ICD-10-CM

## 2021-05-13 DIAGNOSIS — N185 Chronic kidney disease, stage 5: Secondary | ICD-10-CM | POA: Insufficient documentation

## 2021-05-13 DIAGNOSIS — E1165 Type 2 diabetes mellitus with hyperglycemia: Secondary | ICD-10-CM | POA: Diagnosis not present

## 2021-05-13 DIAGNOSIS — E1122 Type 2 diabetes mellitus with diabetic chronic kidney disease: Secondary | ICD-10-CM

## 2021-05-13 LAB — POCT GLYCOSYLATED HEMOGLOBIN (HGB A1C): Hemoglobin A1C: 12.5 % — AB (ref 4.0–5.6)

## 2021-05-13 NOTE — Telephone Encounter (Signed)
Pt LVM stating that she is having trouble with the monitor and wanted a cb. ?

## 2021-05-13 NOTE — Patient Instructions (Addendum)
check your blood sugar twice a day.  vary the time of day when you check, between before the 3 meals, and at bedtime.  also check if you have symptoms of your blood sugar being too high or too low.  please keep a record of the readings and bring it to your next appointment here.  You can write it on any piece of paper.  please call us sooner if your blood sugar goes below 70, or if you have a lot of readings over 200.  ?Please continue the same insulin for now.   ?On this type of insulin schedule, you should eat meals on a regular schedule.  If a meal is missed or significantly delayed, your blood sugar could go low.    ?Please come back for a follow-up appointment in 1 month.   ? ? ? ?

## 2021-05-13 NOTE — Progress Notes (Signed)
? ?Subjective:  ? ? Patient ID: Sandy Roy, female    DOB: [birth year], 71 y.o.   MRN: [id number] ? ?HPI ?Pt returns for f/u of diabetes mellitus: ?DM type: insulin-requiring type 2 ?Dx'ed: 1989 ?Complications: CAD and ESRD (transplant 2016).   ?Therapy: insulin since 2015.   ?GDM: never.   ?DKA: never.  ?Severe hypoglycemia: never.   ?Pancreatitis: never.   ?SDOH:  She no longer checks cbg's: she takes human insulin, due to cost.   ?Other: she has requested a QD, inexpensive insulin regimen; she is chronically on low-dose prednisone; the pattern of her cbg's indicates she needs Reg QAM, but no insulin later in the day.  NPH was stopped in ER.  fructosamine has confirmed A1c.  ?Interval history: she was recently in the hospital with COVID.  She had not recently taken her insulin.  Reg insulin was changed to Lantus QHS.  D/C summary appears to assume she was taking reg insulin TID.  She has continuous glucose monitor, but she does not know how to use.  She does not check cbg.   ?pt had thyroidectomy for 2 small foci of papillary cancer (2008).  foci were not large enough to warrant RAI.  F/u US in 2021 showed small amount of residual thyroid tissue.   ?Past Medical History:  ?Diagnosis Date  ? Chronic kidney disease   ? ESRD-  [place]- M_W_F  ? CONGESTIVE HEART FAILURE 12/14/2006  ? CORONARY ARTERY DISEASE 12/14/2006  ? Depression   ? not recently  ? DIABETES MELLITUS, TYPE II 12/14/2006  ? Type 2  ? HYPERLIPIDEMIA 12/14/2006  ? HYPERTENSION 12/14/2006  ? Hypoparathyroidism ([place]) 12/21/2006  ? Hypopotassemia 01/13/2007  ? HYPOTHYROIDISM, POSTSURGICAL 12/15/2006  ? NEOPLASM, MALIGNANT, THYROID GLAND 12/15/2006  ? Stage 1 papillary adenocarcinoma, one very small focus each lobe (12m, 244m  6/09: CT neck: no evidence of recurrence  ? OBSTRUCTIVE SLEEP APNEA 02/23/2007  ? does not use a cpap  ? [place]  ? left eye  ? Shortness of breath dyspnea   ? with exertion  ? ? ?Past Surgical History:  ?Procedure  Laterality Date  ? BASCILIC VEIN TRANSPOSITION Right 02/07/2014  ? Procedure: FIRST STAGE BASILIC VEIN TRANSPOSITION;  Surgeon: BrConrad BurlingtonMD;  [place] [facility];  Laterality: Right;  ? BASCILIC VEIN TRANSPOSITION Right 05/30/2014  ? Procedure: SECOND STAGE BASILIC VEIN TRANSPOSITION;  Surgeon: BrConrad BurlingtonMD;  [facility];  Laterality: Right;  ? BREAST SURGERY Left   ? nipple leaking, had surgery to fix the leak  ? CARDIAC CATHETERIZATION    ? ? 10 years ago  ? CHOLECYSTECTOMY    ? COLONOSCOPY    ? ORBITAL FRACTURE SURGERY    ? OVARY SURGERY    ? THYROIDECTOMY  2008  ? TONSILLECTOMY    ? TUBAL LIGATION    ? ? ?Social History  ? ?Socioeconomic History  ? Marital status: Widowed  ?  Spouse name: Not on file  ? Number of children: Not on file  ? Years of education: Not on file  ? Highest education level: Not on file  ?Occupational History  ? Not on file  ?Tobacco Use  ? Smoking status: Former  ?  Types: Cigarettes  ? Smokeless tobacco: Never  ? Tobacco comments:  ?  "quit years ago"  ?Vaping Use  ? Vaping Use: Never used  ?Substance and Sexual Activity  ? Alcohol use: No  ?  Alcohol/week: 0.0 standard drinks  ? Drug use: No  ? Sexual activity: Not on file  ?Other Topics Concern  ? Not on file  ?Social History Narrative  ? Not on file  ? ?Social Determinants of Health  ? ?Financial Resource Strain: Not on file  ?Food Insecurity: Not on file  ?Transportation Needs: Not on file  ?Physical Activity: Not on file  ?Stress: Not on file  ?Social Connections: Not on file  ?Intimate Partner Violence: Not on file  ? ? ?Current Outpatient Medications on File Prior to Visit  ?Medication Sig Dispense Refill  ? apixaban (ELIQUIS) 5 MG TABS tablet Take 1 tablet (5 mg total) by mouth 2 (two) times daily. 60 tablet 5  ? atorvastatin (LIPITOR) 10 MG tablet Take 10 mg by mouth daily.    ? calcitRIOL (ROCALTROL) 0.5 MCG capsule Take 0.5 mcg by mouth as directed. M/w/f    ? CALCIUM PO Take by  mouth.    ? cloNIDine (CATAPRES) 0.2 MG tablet Take 0.1 mg by mouth 2 (two) times daily.     ? Continuous Blood Gluc Sensor (FREESTYLE LIBRE 2 SENSOR) MISC 1 Device by Does not apply route every 14 (fourteen) days. 6 each 3  ? fish oil-omega-3 fatty acids 1000 MG capsule Take 1 capsule by mouth daily.    ? furosemide (LASIX) 40 MG tablet 2 tabs in the am, 1 tab in the pm (Patient taking differently: Take 20 mg by mouth 2 (two) times daily.) 90 tablet 1  ? glucose blood (ONETOUCH VERIO) test strip 1 each by Other route 2 (two) times daily. And lancets 2/day Dx code: E11.9 200 each 3  ? hydrALAZINE (APRESOLINE) 50 MG tablet Take 25 mg by mouth 3 (three) times daily.    ? Insulin Syringe-Needle U-100 (B-D INSULIN SYRINGE) 31G X 5/16" 0.3 ML MISC Use 1 time per day. 100 each 1  ? labetalol (NORMODYNE) 100 MG tablet 2 in the morning 2 in the evening  2  ? Lancets (ONETOUCH ULTRASOFT) lancets Use to check blood sugar 2 times per day 100 each 2  ? LANTUS 100 UNIT/ML injection Inject 15 Units into the skin daily. 10 mL 0  ? levothyroxine (SYNTHROID) 112 MCG tablet Take 1 tablet (112 mcg total) by mouth daily. 90 tablet 3  ? lidocaine (LIDODERM) 5 % Place 3 patches onto the skin daily. Remove & Discard patch within 12 hours or as directed by MD    ? Multiple Vitamins-Minerals (MULTIVITAMIN WITH MINERALS) tablet Take 1 tablet by mouth daily.    ? mycophenolate (MYFORTIC) 180 MG EC tablet Take 180 mg by mouth 2 (two) times daily.    ? nitroGLYCERIN (NITROSTAT) 0.4 MG SL tablet Place 1 tablet (0.4 mg total) under the tongue every 5 (five) minutes as needed. 25 tablet 5  ? omeprazole (PRILOSEC) 20 MG capsule Take 20 mg by mouth daily.    ? potassium chloride SA (KLOR-CON) 20 MEQ tablet Take 1 tablet by mouth daily.    ? predniSONE (DELTASONE) 5 MG tablet Take 5 mg by mouth daily with breakfast.    ? sulfamethoxazole-trimethoprim (BACTRIM,SEPTRA) 400-80 MG per tablet 1 tablet. On Monday Wednesday and Friday    ? tacrolimus  (PROGRAF) 1 MG capsule Take 4 mg by mouth. 6 tabs in the morning 6 tabs in the evening    ? triazolam (HALCION) 0.25 MG tablet 2 tablets by mouth at bedtime    ? ?No current facility-administered medications on file prior to visit.  ? ? ?  Allergies  ?Allergen Reactions  ? Lisinopril Swelling  ? Trazodone Hcl   ?  Other reaction(s): depression  ? ? ?Family History  ?Problem Relation Age of Onset  ? Heart failure Mother   ? Diabetes Mother   ? Cancer Father   ? Cancer Other   ? COPD Other   ? Hypertension Other   ? Hyperlipidemia Other   ? Stroke Other   ? Heart disease Other   ? Diabetes Other   ? ? ?BP (!) 170/78   Pulse 82   Ht '5\' 2"'$  (1.575 m)   Wt 173 lb (78.5 kg)   SpO2 96%   BMI 31.64 kg/m?  ? ?  ?Review of Systems ?She has lost 8 lbs since last ov here.   ?  ? ?   ?Objective:  ? Physical Exam ? ? ? ?Lab Results  ?Component Value Date  ? HGBA1C 12.5 (A) 05/13/2021  ? ?   ?Assessment & Plan:  ?Insulin-requiring type 2 DM: uncontrolled.  Therapy is complicated by noncompliance and insulin adjustments in the hospital.  She most likely needs to resume QAM Reg insulin.  However, she has lost weight, so insulin needs may have changed since last ov here.  Next step is to see continuous glucose monitor data. ? ?Patient Instructions  ?check your blood sugar twice a day.  vary the time of day when you check, between before the 3 meals, and at bedtime.  also check if you have symptoms of your blood sugar being too high or too low.  please keep a record of the readings and bring it to your next appointment here.  You can write it on any piece of paper.  please call us sooner if your blood sugar goes below 70, or if you have a lot of readings over 200.  ?Please continue the same insulin for now.   ?On this type of insulin schedule, you should eat meals on a regular schedule.  If a meal is missed or significantly delayed, your blood sugar could go low.    ?Please come back for a follow-up appointment in 1 month.    ? ? ? ? ? ?

## 2021-05-13 NOTE — Progress Notes (Deleted)
? ?Subjective:  ? ? Patient ID: Sandy Roy, female    DOB: 20-Jun-1950, 71 y.o.   MRN: [id number] ? ?HPI ?HPI ?Pt returns for f/u of diabetes mellitus: ?DM type: insulin-requiring type 2 ?Dx'ed: 1989 ?Complications: CAD and ESRD (transplant 2016).   ?Therapy: insulin since 2015.   ?GDM: never.   ?DKA: never.  ?Severe hypoglycemia: never.   ?Pancreatitis: never.   ?SDOH:  She no longer checks cbg's: she takes human insulin, due to cost.   ?Other: she has requested a QD, inexpensive insulin regimen; she is chronically on low-dose prednisone; the pattern of her cbg's indicates she needs Reg QAM, but no insulin later in the day.  NPH was stopped in ER.  fructosamine has confirmed A1c.  ?Interval history: she was recently in the hospital with COVID.  She had not recently taken her insulin.  Reg insulin was changed to Lantus QHS.  D/C summary appears to assume she was taking reg insulin TID.  She has continuous glucose monitor, but she does not know how to use.  She does not check cbg.   ?pt had thyroidectomy for 2 small foci of papillary cancer (2008).  foci were not large enough to warrant RAI.  F/u US in 2021 showed small amount of residual thyroid tissue.   ?Past Medical History:  ?Diagnosis Date  ? Chronic kidney disease   ? ESRD-  [place]- M_W_F  ? CONGESTIVE HEART FAILURE 12/14/2006  ? CORONARY ARTERY DISEASE 12/14/2006  ? Depression   ? not recently  ? DIABETES MELLITUS, TYPE II 12/14/2006  ? Type 2  ? HYPERLIPIDEMIA 12/14/2006  ? HYPERTENSION 12/14/2006  ? Hypoparathyroidism ([place]) 12/21/2006  ? Hypopotassemia 01/13/2007  ? HYPOTHYROIDISM, POSTSURGICAL 12/15/2006  ? NEOPLASM, MALIGNANT, THYROID GLAND 12/15/2006  ? Stage 1 papillary adenocarcinoma, one very small focus each lobe (47m, 269m  6/09: CT neck: no evidence of recurrence  ? OBSTRUCTIVE SLEEP APNEA 02/23/2007  ? does not use a cpap  ? [place]  ? [place]  ? Shortness of breath dyspnea   ? with exertion  ? ? ?Past Surgical History:  ?Procedure  Laterality Date  ? BASCILIC VEIN TRANSPOSITION Right 02/07/2014  ? Procedure: FIRST STAGE BASILIC VEIN TRANSPOSITION;  Surgeon: BrConrad BurlingtonMD;  [facility];  Laterality: Right;  ? BASCILIC VEIN TRANSPOSITION Right 05/30/2014  ? Procedure: SECOND STAGE BASILIC VEIN TRANSPOSITION;  Surgeon: BrConrad BurlingtonMD;  [facility];  Laterality: Right;  ? BREAST SURGERY Left   ? nipple leaking, had surgery to fix the leak  ? CARDIAC CATHETERIZATION    ? ? 10 years ago  ? CHOLECYSTECTOMY    ? COLONOSCOPY    ? ORBITAL FRACTURE SURGERY    ? OVARY SURGERY    ? THYROIDECTOMY  2008  ? TONSILLECTOMY    ? TUBAL LIGATION    ? ? ?Social History  ? ?Socioeconomic History  ? Marital status: Widowed  ?  Spouse name: Not on file  ? Number of children: Not on file  ? Years of education: Not on file  ? Highest education level: Not on file  ?Occupational History  ? Not on file  ?Tobacco Use  ? Smoking status: Former  ?  Types: Cigarettes  ? Smokeless tobacco: Never  ? Tobacco comments:  ?  "quit years ago"  ?Vaping Use  ? Vaping Use: Never used  ?Substance and Sexual Activity  ? Alcohol use: No  ?  Alcohol/week: 0.0 standard drinks  ? Drug use: No  ? Sexual activity: Not on file  ?Other Topics Concern  ? Not on file  ?Social History Narrative  ? Not on file  ? ?Social Determinants of Health  ? ?Financial Resource Strain: Not on file  ?Food Insecurity: Not on file  ?Transportation Needs: Not on file  ?Physical Activity: Not on file  ?Stress: Not on file  ?Social Connections: Not on file  ?Intimate Partner Violence: Not on file  ? ? ?Current Outpatient Medications on File Prior to Visit  ?Medication Sig Dispense Refill  ? apixaban (ELIQUIS) 5 MG TABS tablet Take 1 tablet (5 mg total) by mouth 2 (two) times daily. 60 tablet 5  ? atorvastatin (LIPITOR) 10 MG tablet Take 10 mg by mouth daily.    ? calcitRIOL (ROCALTROL) 0.5 MCG capsule Take 0.5 mcg by mouth as directed. M/w/f    ? CALCIUM PO Take by  mouth.    ? cloNIDine (CATAPRES) 0.2 MG tablet Take 0.1 mg by mouth 2 (two) times daily.     ? Continuous Blood Gluc Sensor (FREESTYLE LIBRE 2 SENSOR) MISC 1 Device by Does not apply route every 14 (fourteen) days. 6 each 3  ? fish oil-omega-3 fatty acids 1000 MG capsule Take 1 capsule by mouth daily.    ? furosemide (LASIX) 40 MG tablet 2 tabs in the am, 1 tab in the pm (Patient taking differently: Take 20 mg by mouth 2 (two) times daily.) 90 tablet 1  ? glucose blood (ONETOUCH VERIO) test strip 1 each by Other route 2 (two) times daily. And lancets 2/day Dx code: E11.9 200 each 3  ? hydrALAZINE (APRESOLINE) 50 MG tablet Take 25 mg by mouth 3 (three) times daily.    ? Insulin Syringe-Needle U-100 (B-D INSULIN SYRINGE) 31G X 5/16" 0.3 ML MISC Use 1 time per day. 100 each 1  ? labetalol (NORMODYNE) 100 MG tablet 2 in the morning 2 in the evening  2  ? Lancets (ONETOUCH ULTRASOFT) lancets Use to check blood sugar 2 times per day 100 each 2  ? LANTUS 100 UNIT/ML injection Inject 15 Units into the skin daily. 10 mL 0  ? levothyroxine (SYNTHROID) 112 MCG tablet Take 1 tablet (112 mcg total) by mouth daily. 90 tablet 3  ? lidocaine (LIDODERM) 5 % Place 3 patches onto the skin daily. Remove & Discard patch within 12 hours or as directed by MD    ? Multiple Vitamins-Minerals (MULTIVITAMIN WITH MINERALS) tablet Take 1 tablet by mouth daily.    ? mycophenolate (MYFORTIC) 180 MG EC tablet Take 180 mg by mouth 2 (two) times daily.    ? nitroGLYCERIN (NITROSTAT) 0.4 MG SL tablet Place 1 tablet (0.4 mg total) under the tongue every 5 (five) minutes as needed. 25 tablet 5  ? omeprazole (PRILOSEC) 20 MG capsule Take 20 mg by mouth daily.    ? potassium chloride SA (KLOR-CON) 20 MEQ tablet Take 1 tablet by mouth daily.    ? predniSONE (DELTASONE) 5 MG tablet Take 5 mg by mouth daily with breakfast.    ? sulfamethoxazole-trimethoprim (BACTRIM,SEPTRA) 400-80 MG per tablet 1 tablet. On Monday Wednesday and Friday    ? tacrolimus  (PROGRAF) 1 MG capsule Take 4 mg by mouth. 6 tabs in the morning 6 tabs in the evening    ? triazolam (HALCION) 0.25 MG tablet 2 tablets by mouth at bedtime    ? ?No current facility-administered medications on file prior to visit.  ? ? ?  Allergies  ?Allergen Reactions  ? Lisinopril Swelling  ? Trazodone Hcl   ?  Other reaction(s): depression  ? ? ?Family History  ?Problem Relation Age of Onset  ? Heart failure Mother   ? Diabetes Mother   ? Cancer Father   ? Cancer Other   ? COPD Other   ? Hypertension Other   ? Hyperlipidemia Other   ? Stroke Other   ? Heart disease Other   ? Diabetes Other   ? ? ?BP (!) 170/78   Pulse 82   Ht '5\' 2"'$  (1.575 m)   Wt 173 lb (78.5 kg)   SpO2 96%   BMI 31.64 kg/m?  ? ?   ? ?Review of Systems ?She has lost 8 lbs since last ov here.   ?   ?Objective:  ? Physical Exam ?VITAL SIGNS:  See vs page ?GENERAL: no distress ? ? ? ? ?   ?Assessment & Plan:  ?Insulin-requiring type 2 DM: uncontrolled.   ? ?Patient Instructions  ?check your blood sugar twice a day.  vary the time of day when you check, between before the 3 meals, and at bedtime.  also check if you have symptoms of your blood sugar being too high or too low.  please keep a record of the readings and bring it to your next appointment here.  You can write it on any piece of paper.  please call us sooner if your blood sugar goes below 70, or if you have a lot of readings over 200.  ?Please continue the same insulin for now.   ?On this type of insulin schedule, you should eat meals on a regular schedule.  If a meal is missed or significantly delayed, your blood sugar could go low.    ?Please come back for a follow-up appointment in 1 month.   ? ? ? ? ? ? ?

## 2021-05-14 NOTE — Progress Notes (Signed)
Per Dr. Cordelia Pen orders, the patient was trained on the use of the Libre 2 CGM.  We discussed the difference between blood glucose testing and CGM, and she reported good understanding of this. ?The app was put on her phone and the phone was linked to [place].  The sensor was started and patient was advised to scan sensor first morning, ac meals and at HS to capture the full 24 hour picture of her blood sugar readings.  She redemonstrated this and had had no final questions.  She asked if I could test her blood sugar, and it was 420.  Dr. Loanne Drilling notified.  Pt. Is on only Lantus insulin from MD orders at the ER.  Per Dr. Cordelia Pen orders, she was given no insulin at this time, and her CGM download will be printed out On Wednesday and put on his desk for review.  Patient was instructed to stop all sweet drinks, no fruit juices and no cold cereal and milk.  She agreed to do this and to continue her Lantus insulin only until notified by this office.  She was told to drink lots of calorie free fluids and to call if blood sugars go higher.  She agreed to do this and had no final questions. ?

## 2021-05-14 NOTE — Telephone Encounter (Signed)
Patient reports that she is not able to to get a blood sugar reading.  She tried customer care number but was on hold for over an hour.  She was told to open the app, and scan the sensor with back up phone.   ?She took a blood glucose reading last at 5PM: '530mg'$ /dl, at 8PM: 485, and this AM at 8:00 it was 276.  She took 15u of Lantus last night ?App It is currently reading "HI".  I made her do a blood sugar test, it is currently (11AM) 373. ?Please advise.  Do you still want to wait until tomorrow for the download? ?

## 2021-05-14 NOTE — Patient Instructions (Signed)
Scan sensor first thing in the morning, before meals and at bedtime ?Call if blood sugar readings do not come down. ?Continue to take your Lantus insulin as directed  ?

## 2021-05-15 ENCOUNTER — Other Ambulatory Visit: Payer: Self-pay | Admitting: Endocrinology

## 2021-05-15 ENCOUNTER — Telehealth: Payer: Self-pay | Admitting: Nutrition

## 2021-05-15 DIAGNOSIS — E1122 Type 2 diabetes mellitus with diabetic chronic kidney disease: Secondary | ICD-10-CM

## 2021-05-15 DIAGNOSIS — N185 Chronic kidney disease, stage 5: Secondary | ICD-10-CM

## 2021-05-15 MED ORDER — NOVOLIN R FLEXPEN 100 UNIT/ML IJ SOPN: 15.0000 [IU] | PEN_INJECTOR | Freq: Every day | INTRAMUSCULAR | 3 refills | Status: DC

## 2021-05-15 NOTE — Telephone Encounter (Signed)
Patient has now been informed to change lantus to 15 units qam ?

## 2021-05-15 NOTE — Telephone Encounter (Signed)
CGM download put in Dr. Cordelia Pen desk for  ?

## 2021-05-16 DIAGNOSIS — Z940 Kidney transplant status: Secondary | ICD-10-CM | POA: Diagnosis not present

## 2021-05-16 DIAGNOSIS — I4891 Unspecified atrial fibrillation: Secondary | ICD-10-CM | POA: Diagnosis not present

## 2021-05-16 DIAGNOSIS — E119 Type 2 diabetes mellitus without complications: Secondary | ICD-10-CM | POA: Diagnosis not present

## 2021-05-16 DIAGNOSIS — I10 Essential (primary) hypertension: Secondary | ICD-10-CM | POA: Diagnosis not present

## 2021-05-20 ENCOUNTER — Telehealth: Payer: Self-pay

## 2021-05-20 DIAGNOSIS — E1122 Type 2 diabetes mellitus with diabetic chronic kidney disease: Secondary | ICD-10-CM

## 2021-05-20 DIAGNOSIS — I4891 Unspecified atrial fibrillation: Secondary | ICD-10-CM | POA: Diagnosis not present

## 2021-05-20 DIAGNOSIS — Z940 Kidney transplant status: Secondary | ICD-10-CM | POA: Diagnosis not present

## 2021-05-20 DIAGNOSIS — N390 Urinary tract infection, site not specified: Secondary | ICD-10-CM | POA: Diagnosis not present

## 2021-05-20 MED ORDER — PEN NEEDLES 33G X 4 MM MISC: 3 refills | Status: DC

## 2021-05-20 NOTE — Telephone Encounter (Signed)
Weekend call from the patient saying she hasnt been able to use her Novolin flex pen due to not having any pen needles. Spoke with her to inform her that pen needles will now been sent to her pharmacy in order to take her insulin. ?

## 2021-05-24 ENCOUNTER — Telehealth: Payer: Self-pay

## 2021-05-24 ENCOUNTER — Telehealth: Payer: Self-pay | Admitting: Dietician

## 2021-05-24 NOTE — Telephone Encounter (Signed)
Returned patient call and spoke to patient. ?Patient's Elenor Legato is expring on 05/26/2021 and patient wants assistance restarting another [place].  Will work her in at 2:00 05/28/2021. ? ?Antonieta Iba, RD, LDN, CDCES ? ?

## 2021-05-24 NOTE — Telephone Encounter (Signed)
Patient says that she is to replace her Freestyle [place] 2 sensor that expires in 2 days. Her prior CGM was placed on in office with she recalls Vaughan Basta. She would like to have assistance with placing on her new CGM. Can she be scheduled with one of you all within the upcoming week as her CGM will expire on the 19th? ?

## 2021-05-28 ENCOUNTER — Encounter: Payer: Medicare Other | Admitting: Dietician

## 2021-05-28 ENCOUNTER — Other Ambulatory Visit: Payer: Self-pay

## 2021-05-28 DIAGNOSIS — E1165 Type 2 diabetes mellitus with hyperglycemia: Secondary | ICD-10-CM | POA: Diagnosis not present

## 2021-05-28 DIAGNOSIS — E1122 Type 2 diabetes mellitus with diabetic chronic kidney disease: Secondary | ICD-10-CM

## 2021-05-28 DIAGNOSIS — Z794 Long term (current) use of insulin: Secondary | ICD-10-CM | POA: Diagnosis not present

## 2021-05-28 DIAGNOSIS — N185 Chronic kidney disease, stage 5: Secondary | ICD-10-CM | POA: Diagnosis not present

## 2021-05-28 NOTE — Progress Notes (Signed)
Patient was seen today for a review training session for the [place] 2.  She forgot her phone at home and lives 45 minutes away.  She did bring the [facility] and therefore started her new sensor with this Receiver.  Discussed that she can choose to use the [facility] receiver or phone app in 2 weeks with her new sensor change.  Patient was able to apply the sensor with prompting and scan the sensor.  It is in a 60 minute warm up. ?She states that her 61 yo foster son helped her learn how to use the insulin pen using you tube demonstrations.  Patient expressed frustration that she did not have training in the office.  Reviewed technique for giving an injection using the insulin pen as well as storage for the pens and vials. ?Patient shaking and frustrated. ?Blood glucose checked prior to her leaving the office which was 211.  Patient stated that she felt well enough to drive home. ? ?Encouraged patient to call for any questions. ? ?Antonieta Iba, RD, LDN, CDCES ? ? ?

## 2021-06-03 ENCOUNTER — Other Ambulatory Visit: Payer: Self-pay | Admitting: Endocrinology

## 2021-06-24 DIAGNOSIS — E1122 Type 2 diabetes mellitus with diabetic chronic kidney disease: Secondary | ICD-10-CM | POA: Diagnosis not present

## 2021-06-24 DIAGNOSIS — D6869 Other thrombophilia: Secondary | ICD-10-CM | POA: Diagnosis not present

## 2021-06-24 DIAGNOSIS — E782 Mixed hyperlipidemia: Secondary | ICD-10-CM | POA: Diagnosis not present

## 2021-06-24 DIAGNOSIS — I129 Hypertensive chronic kidney disease with stage 1 through stage 4 chronic kidney disease, or unspecified chronic kidney disease: Secondary | ICD-10-CM | POA: Diagnosis not present

## 2021-06-24 DIAGNOSIS — G4700 Insomnia, unspecified: Secondary | ICD-10-CM | POA: Diagnosis not present

## 2021-06-24 DIAGNOSIS — R609 Edema, unspecified: Secondary | ICD-10-CM | POA: Diagnosis not present

## 2021-06-24 DIAGNOSIS — Z794 Long term (current) use of insulin: Secondary | ICD-10-CM | POA: Diagnosis not present

## 2021-06-24 DIAGNOSIS — N184 Chronic kidney disease, stage 4 (severe): Secondary | ICD-10-CM | POA: Diagnosis not present

## 2021-06-24 DIAGNOSIS — I480 Paroxysmal atrial fibrillation: Secondary | ICD-10-CM | POA: Diagnosis not present

## 2021-06-24 DIAGNOSIS — Z940 Kidney transplant status: Secondary | ICD-10-CM | POA: Diagnosis not present

## 2021-06-24 DIAGNOSIS — B0229 Other postherpetic nervous system involvement: Secondary | ICD-10-CM | POA: Diagnosis not present

## 2021-06-26 DIAGNOSIS — Z940 Kidney transplant status: Secondary | ICD-10-CM | POA: Diagnosis not present

## 2021-06-28 ENCOUNTER — Other Ambulatory Visit: Payer: Self-pay | Admitting: Endocrinology

## 2021-06-28 ENCOUNTER — Telehealth: Payer: Self-pay

## 2021-06-28 MED ORDER — INSULIN REGULAR HUMAN 100 UNIT/ML IJ SOLN
15.0000 [IU] | Freq: Every day | INTRAMUSCULAR | 1 refills | Status: DC
Start: 2021-06-28 — End: 2021-11-04

## 2021-06-28 NOTE — Telephone Encounter (Signed)
Patient says that her insurance does cover the Novolin Flexpen and wants to know if she is to take Lantus twice a day instead of just in the evening. Please advise ?

## 2021-07-01 ENCOUNTER — Other Ambulatory Visit: Payer: Self-pay | Admitting: Endocrinology

## 2021-07-03 ENCOUNTER — Other Ambulatory Visit: Payer: Self-pay

## 2021-07-03 DIAGNOSIS — E1122 Type 2 diabetes mellitus with diabetic chronic kidney disease: Secondary | ICD-10-CM

## 2021-07-03 MED ORDER — PEN NEEDLES 33G X 4 MM MISC: 3 refills | Status: DC

## 2021-07-08 ENCOUNTER — Ambulatory Visit: Payer: Medicare Other | Admitting: Endocrinology

## 2021-07-08 VITALS — BP 100/60 | HR 67 | Ht 62.0 in | Wt 177.4 lb

## 2021-07-08 DIAGNOSIS — N185 Chronic kidney disease, stage 5: Secondary | ICD-10-CM | POA: Diagnosis not present

## 2021-07-08 DIAGNOSIS — Z794 Long term (current) use of insulin: Secondary | ICD-10-CM

## 2021-07-08 DIAGNOSIS — E1122 Type 2 diabetes mellitus with diabetic chronic kidney disease: Secondary | ICD-10-CM | POA: Diagnosis not present

## 2021-07-08 LAB — POCT GLYCOSYLATED HEMOGLOBIN (HGB A1C): Hemoglobin A1C: 9.2 % — AB (ref 4.0–5.6)

## 2021-07-08 MED ORDER — INSULIN GLARGINE 100 UNIT/ML ~~LOC~~ SOLN: 18.0000 [IU] | SUBCUTANEOUS | 1 refills | Status: DC

## 2021-07-08 NOTE — Progress Notes (Signed)
? ?Subjective:  ? ? Patient ID: Sandy Roy, female    DOB: January 10, 1951, 71 y.o.   MRN: [id number] ? ?HPI ?Pt returns for f/u of diabetes mellitus: ?DM type: insulin-requiring type 2 ?Dx'ed: 1989 ?Complications: CAD and ESRD (transplant 2016).   ?Therapy: insulin since 2015.   ?GDM: never.   ?DKA: never.  ?Severe hypoglycemia: never.   ?Pancreatitis: never.   ?SDOH: none.   ?Other: she has requested a QD, inexpensive insulin regimen; she is chronically on low-dose prednisone; the pattern of her cbg's indicates she needs Reg QAM, but no insulin later in the day.  NPH was stopped in ER.  fructosamine has confirmed A1c.   ?Interval history: I reviewed continuous glucose monitor data.  Glucose varies from 65-330.  It is in general highest at 10PM, and lowest at [place].  It decreases overnight, then increases until 6PM. ?pt had thyroidectomy for 2 small foci of papillary cancer (2008).  foci were not large enough to warrant RAI.  F/u US in 2021 showed small amount of residual thyroid tissue.   ?Past Medical History:  ?Diagnosis Date  ? Chronic kidney disease   ? ESRD-  [place]- M_W_F  ? CONGESTIVE HEART FAILURE 12/14/2006  ? CORONARY ARTERY DISEASE 12/14/2006  ? Depression   ? not recently  ? DIABETES MELLITUS, TYPE II 12/14/2006  ? Type 2  ? HYPERLIPIDEMIA 12/14/2006  ? HYPERTENSION 12/14/2006  ? Hypoparathyroidism ([place]) 12/21/2006  ? Hypopotassemia 01/13/2007  ? HYPOTHYROIDISM, POSTSURGICAL 12/15/2006  ? NEOPLASM, MALIGNANT, THYROID GLAND 12/15/2006  ? Stage 1 papillary adenocarcinoma, one very small focus each lobe (24m, 256m  6/09: CT neck: no evidence of recurrence  ? OBSTRUCTIVE SLEEP APNEA 02/23/2007  ? does not use a cpap  ? [place]  ? left eye  ? Shortness of breath dyspnea   ? with exertion  ? ? ?Past Surgical History:  ?Procedure Laterality Date  ? BASCILIC VEIN TRANSPOSITION Right 02/07/2014  ? Procedure: FIRST STAGE BASILIC VEIN TRANSPOSITION;  Surgeon: BrConrad BurlingtonMD;  [facility]  [facility];  Laterality: Right;  ? BASCILIC VEIN TRANSPOSITION Right 05/30/2014  ? Procedure: SECOND STAGE BASILIC VEIN TRANSPOSITION;  Surgeon: BrConrad BurlingtonMD;  [facility];  Laterality: Right;  ? BREAST SURGERY Left   ? nipple leaking, had surgery to fix the leak  ? CARDIAC CATHETERIZATION    ? ? 10 years ago  ? CHOLECYSTECTOMY    ? COLONOSCOPY    ? ORBITAL FRACTURE SURGERY    ? OVARY SURGERY    ? THYROIDECTOMY  2008  ? TONSILLECTOMY    ? TUBAL LIGATION    ? ? ?Social History  ? ?Socioeconomic History  ? Marital status: Widowed  ?  Spouse name: Not on file  ? Number of children: Not on file  ? Years of education: Not on file  ? Highest education level: Not on file  ?Occupational History  ? Not on file  ?Tobacco Use  ? Smoking status: Former  ?  Types: Cigarettes  ? Smokeless tobacco: Never  ? Tobacco comments:  ?  "quit years ago"  ?Vaping Use  ? Vaping Use: Never used  ?Substance and Sexual Activity  ? Alcohol use: No  ?  Alcohol/week: 0.0 standard drinks  ? Drug use: No  ? Sexual activity: Not on file  ?Other Topics Concern  ? Not on file  ?Social History Narrative  ? Not on file  ? ?Social Determinants  of Health  ? ?Financial Resource Strain: Not on file  ?Food Insecurity: Not on file  ?Transportation Needs: Not on file  ?Physical Activity: Not on file  ?Stress: Not on file  ?Social Connections: Not on file  ?Intimate Partner Violence: Not on file  ? ? ?Current Outpatient Medications on File Prior to Visit  ?Medication Sig Dispense Refill  ? apixaban (ELIQUIS) 5 MG TABS tablet Take 1 tablet (5 mg total) by mouth 2 (two) times daily. 60 tablet 5  ? atorvastatin (LIPITOR) 10 MG tablet Take 10 mg by mouth daily.    ? BD INSULIN SYRINGE U/F 31G X 5/16" 0.5 ML MISC Inject 1 each into the skin daily at 6pm. 100 each 0  ? calcitRIOL (ROCALTROL) 0.5 MCG capsule Take 0.5 mcg by mouth as directed. M/w/f    ? CALCIUM PO Take by mouth.    ? cloNIDine (CATAPRES) 0.2 MG tablet Take 0.1 mg by mouth 2  (two) times daily.     ? Continuous Blood Gluc Sensor (FREESTYLE LIBRE 2 SENSOR) MISC 1 Device by Does not apply route every 14 (fourteen) days. 6 each 3  ? fish oil-omega-3 fatty acids 1000 MG capsule Take 1 capsule by mouth daily.    ? furosemide (LASIX) 40 MG tablet 2 tabs in the am, 1 tab in the pm (Patient taking differently: Take 20 mg by mouth 2 (two) times daily.) 90 tablet 1  ? glucose blood (ONETOUCH VERIO) test strip 1 each by Other route 2 (two) times daily. And lancets 2/day Dx code: E11.9 200 each 3  ? hydrALAZINE (APRESOLINE) 50 MG tablet Take 25 mg by mouth 3 (three) times daily.    ? Insulin Pen Needle (PEN NEEDLES) 33G X 4 MM MISC Use to inject insulin 1 pen needle/day 100 each 3  ? insulin regular (HUMULIN R) 100 units/mL injection Inject 0.15 mLs (15 Units total) into the skin daily with breakfast. 20 mL 1  ? Insulin Syringe-Needle U-100 (B-D INSULIN SYRINGE) 31G X 5/16" 0.3 ML MISC Use 1 time per day. 100 each 1  ? labetalol (NORMODYNE) 100 MG tablet 2 in the morning 2 in the evening  2  ? Lancets (ONETOUCH ULTRASOFT) lancets Use to check blood sugar 2 times per day 100 each 2  ? levothyroxine (SYNTHROID) 112 MCG tablet Take 1 tablet (112 mcg total) by mouth daily. 90 tablet 3  ? lidocaine (LIDODERM) 5 % Place 3 patches onto the skin daily. Remove & Discard patch within 12 hours or as directed by MD    ? Multiple Vitamins-Minerals (MULTIVITAMIN WITH MINERALS) tablet Take 1 tablet by mouth daily.    ? mycophenolate (MYFORTIC) 180 MG EC tablet Take 180 mg by mouth 2 (two) times daily.    ? nitroGLYCERIN (NITROSTAT) 0.4 MG SL tablet Place 1 tablet (0.4 mg total) under the tongue every 5 (five) minutes as needed. 25 tablet 5  ? omeprazole (PRILOSEC) 20 MG capsule Take 20 mg by mouth daily.    ? potassium chloride SA (KLOR-CON) 20 MEQ tablet Take 1 tablet by mouth daily.    ? predniSONE (DELTASONE) 5 MG tablet Take 5 mg by mouth daily with breakfast.    ? sulfamethoxazole-trimethoprim  (BACTRIM,SEPTRA) 400-80 MG per tablet 1 tablet. On Monday Wednesday and Friday    ? tacrolimus (PROGRAF) 1 MG capsule Take 4 mg by mouth. 6 tabs in the morning 6 tabs in the evening    ? triazolam (HALCION) 0.25 MG tablet 2 tablets by mouth  at bedtime    ? ?No current facility-administered medications on file prior to visit.  ? ? ?Allergies  ?Allergen Reactions  ? Lisinopril Swelling  ? Trazodone Hcl   ?  Other reaction(s): depression  ? ? ?Family History  ?Problem Relation Age of Onset  ? Heart failure Mother   ? Diabetes Mother   ? Cancer Father   ? Cancer Other   ? COPD Other   ? Hypertension Other   ? Hyperlipidemia Other   ? Stroke Other   ? Heart disease Other   ? Diabetes Other   ? ? ?BP 100/60 (BP Location: Left Arm, Patient Position: Sitting, Cuff Size: Normal)   Pulse 67   Ht '5\' 2"'$  (1.575 m)   Wt 177 lb 6.4 oz (80.5 kg)   SpO2 91%   BMI 32.45 kg/m?  ? ? ?Review of Systems ? ?   ?Objective:  ? Physical Exam ?VITAL SIGNS:  See vs page ?GENERAL: no distress ? ? ?Lab Results  ?Component Value Date  ? HGBA1C 9.2 (A) 07/08/2021  ? ?   ?Assessment & Plan:  ?Insulin-requiring type 2 DM: uncontrolled.  She declines to add non-insulin rx.   ? ?Patient Instructions  ?check your blood sugar twice a day.  vary the time of day when you check, between before the 3 meals, and at bedtime.  also check if you have symptoms of your blood sugar being too high or too low.  please keep a record of the readings and bring it to your next appointment here.  You can write it on any piece of paper.  please call us sooner if your blood sugar goes below 70, or if you have a lot of readings over 200.  ?We will need to take this complex situation in stages.   ?Please change the Lantus to 18 units each morning.  ?Please continue the same R insulin for now.   ?On this type of insulin schedule, you should eat meals on a regular schedule.  If a meal is missed or significantly delayed, your blood sugar could go low.    ?You should have  an endocrinology follow-up appointment in 2 months.   ? ? ? ? ?

## 2021-07-08 NOTE — Patient Instructions (Addendum)
check your blood sugar twice a day.  vary the time of day when you check, between before the 3 meals, and at bedtime.  also check if you have symptoms of your blood sugar being too high or too low.  please keep a record of the readings and bring it to your next appointment here.  You can write it on any piece of paper.  please call us sooner if your blood sugar goes below 70, or if you have a lot of readings over 200.  ?We will need to take this complex situation in stages.   ?Please change the Lantus to 18 units each morning.  ?Please continue the same R insulin for now.   ?On this type of insulin schedule, you should eat meals on a regular schedule.  If a meal is missed or significantly delayed, your blood sugar could go low.    ?You should have an endocrinology follow-up appointment in 2 months.   ? ? ?

## 2021-07-11 DIAGNOSIS — M150 Primary generalized (osteo)arthritis: Secondary | ICD-10-CM | POA: Diagnosis not present

## 2021-07-11 DIAGNOSIS — G894 Chronic pain syndrome: Secondary | ICD-10-CM | POA: Diagnosis not present

## 2021-07-11 DIAGNOSIS — F32A Depression, unspecified: Secondary | ICD-10-CM | POA: Diagnosis not present

## 2021-07-11 DIAGNOSIS — B0229 Other postherpetic nervous system involvement: Secondary | ICD-10-CM | POA: Diagnosis not present

## 2021-07-14 NOTE — Progress Notes (Signed)
? ? ?Patient referred by Antony Contras, MD for atrial flutter ? ?Subjective:  ? ?Sandy Roy, female    DOB: 05/25/1950, 71 y.o.   MRN: [id number] ? ? ?Chief Complaint  ?Patient presents with  ? Coronary Artery Disease  ? Atrial Fibrillation  ? Follow-up  ?  3 months  ? ? ? ?HPI ? ?71 y.o. African American female with CAD, CKD s/p kidney transplant, hypertension, hyperlipidemia, type 2 DM, persistent Afib/flutter ? ?Patient presents for 3 month follow up. At last office visit recommended repeat echocardiogram and ischemic evaluation however patient opted not to proceed with additional cardiac testing at that time. No changes were made at last office visit.  Patient is feeling well overall.  However she does express concern since last office visit that given elevated troponin she would like to reconsider further cardiac testing, particularly concerned regarding progression of CAD.  Denies chest pain, palpitations, syncope, near syncope.  Denies orthopnea, PND.  Patient continues to tolerate anticoagulation without bleeding diathesis. ? ?Current Outpatient Medications on File Prior to Visit  ?Medication Sig Dispense Refill  ? apixaban (ELIQUIS) 5 MG TABS tablet Take 1 tablet (5 mg total) by mouth 2 (two) times daily. 60 tablet 5  ? atorvastatin (LIPITOR) 10 MG tablet Take 10 mg by mouth daily.    ? BD INSULIN SYRINGE U/F 31G X 5/16" 0.5 ML MISC Inject 1 each into the skin daily at 6pm. 100 each 0  ? calcitRIOL (ROCALTROL) 0.5 MCG capsule Take 0.5 mcg by mouth as directed. M/w/f    ? cloNIDine (CATAPRES) 0.2 MG tablet Take 0.1 mg by mouth daily.    ? Continuous Blood Gluc Sensor (FREESTYLE LIBRE 2 SENSOR) MISC 1 Device by Does not apply route every 14 (fourteen) days. 6 each 3  ? fish oil-omega-3 fatty acids 1000 MG capsule Take 1 capsule by mouth daily.    ? glucose blood (ONETOUCH VERIO) test strip 1 each by Other route 2 (two) times daily. And lancets 2/day Dx code: E11.9 200 each 3  ? hydrALAZINE (APRESOLINE)  50 MG tablet Take 25 mg by mouth 3 (three) times daily.    ? insulin glargine (LANTUS) 100 UNIT/ML injection Inject 0.18 mLs (18 Units total) into the skin every morning. And pen needles 1/day 20 mL 1  ? Insulin Pen Needle (PEN NEEDLES) 33G X 4 MM MISC Use to inject insulin 1 pen needle/day 100 each 3  ? insulin regular (HUMULIN R) 100 units/mL injection Inject 0.15 mLs (15 Units total) into the skin daily with breakfast. 20 mL 1  ? Insulin Syringe-Needle U-100 (B-D INSULIN SYRINGE) 31G X 5/16" 0.3 ML MISC Use 1 time per day. 100 each 1  ? labetalol (NORMODYNE) 100 MG tablet 2 in the morning 2 in the evening  2  ? Lancets (ONETOUCH ULTRASOFT) lancets Use to check blood sugar 2 times per day 100 each 2  ? levothyroxine (SYNTHROID) 112 MCG tablet Take 1 tablet (112 mcg total) by mouth daily. 90 tablet 3  ? lidocaine (LIDODERM) 5 % Place 3 patches onto the skin daily. Remove & Discard patch within 12 hours or as directed by MD    ? Multiple Vitamins-Minerals (MULTIVITAMIN WITH MINERALS) tablet Take 1 tablet by mouth daily.    ? mycophenolate (MYFORTIC) 180 MG EC tablet Take 180 mg by mouth 2 (two) times daily.    ? nitroGLYCERIN (NITROSTAT) 0.4 MG SL tablet Place 1 tablet (0.4 mg total) under the tongue every 5 (five) minutes as  needed. 25 tablet 5  ? omeprazole (PRILOSEC) 20 MG capsule Take 20 mg by mouth daily.    ? predniSONE (DELTASONE) 5 MG tablet Take 5 mg by mouth daily with breakfast.    ? sulfamethoxazole-trimethoprim (BACTRIM,SEPTRA) 400-80 MG per tablet 1 tablet. On Monday Wednesday and Friday    ? tacrolimus (PROGRAF) 1 MG capsule Take 4 mg by mouth. 6 tabs in the morning 6 tabs in the evening    ? triazolam (HALCION) 0.25 MG tablet 2 tablets by mouth at bedtime    ? ?No current facility-administered medications on file prior to visit.  ? ? ?Cardiovascular and other pertinent studies: ?EKG 07/15/2021: ?Atrial flutter with 3-1 conduction at a rate of 72 bpm. ? ?EKG 10/10/2020: ?Atrial flutter with variable AV  conduction ?Controlled ventricular rate ?Nonspecific T-abnormality ? ?Echocardiogram 10/02/2020:  ?Normal LV systolic function with visual EF 60-65%. Left ventricle cavity  ?is normal in size. Mild to moderate left ventricular hypertrophy. Normal  ?global wall motion. Unable to evaluate diastolic function due to atrial  ?flutter. Elevated LAP.  ?Left atrial cavity is moderately dilated.  ?Mild to moderate mitral regurgitation.  ?Mild tricuspid regurgitation.  ?Mild pulmonic regurgitation.  ?Small pericardial effusion. There is no hemodynamic significance.  ?Compared to study 12/26/2019 no significant change.  ? ?Mobile cardiac telemetry 14 days 02/06/2020 - 02/20/2020:  ?Dominant rhythm: Atrial flutter/fibrillation  ?HR 48-144 bpm. Avg HR 75 bpm.  ?Afib/flutter burden 64%  ?Afib/flutter is asymptomatic.  ?Multiple episodes of atrial tachycardia, fastest at 133 bpm for 4 beats, longest for 7 beats at 118 bpm.  ?1.7% isolated SVE, <1% couplet/triplets.  ?<1% isolated VE, couplet/triplets.  ?No VT/high grade AV block, sinus pause >3sec noted.  ?1 patient triggered event, correlated with supraventricular ectopy.  ? ?Coronary angiogram 2012: ?1. Abnormal left ventricular function with anteroapical defect compatible ?    with a previous infarction. ? 2. Diffusely narrowed and small distal LAD but no significant focal ?    obstructive stenoses noted. ? 3. Occlusion of the right coronary artery with collaterals from the left ?    coronary system. ? ?EKG 02/06/2020: ?Wandering atrial pacemaker at the rate of 64 bpm.  Normal axis.  Poor R wave progression, cannot exclude anteroseptal infarct old.  Nonspecific T abnormality.   ? ?EKG 01/06/2020: ?Sinus rhythm 67 bpm with rate variation  ?Right atrial enlargement ?Baseline artifact ? ?Recent labs: ?03/28/2021: ?Sodium 138, potassium 4.9, BUN 35, creatinine 1.54, GFR 36 ?Hgb 10.3, HCT 31.8, platelets 216, MCV 93 ?Magnesium 1.8 ?BMP 544 ?Troponin 290, 299, 264 ?A1c  11.5% ? ?10/09/2020: ?Glucose 306, BUN/Cr 39/2.5. EGFR 20. Na/K 137/4.5. Rest of the CMP normal ?H/H 12/38. MCV 93. Platelets 304 ?HbA1C 11.8% ?Chol 220, TG 194, HDL 70, LDL 109 ?Mag 1.5 ? ?02/01/2020: ?Glucose 138, BUN/Cr 40/1.7. EGFR 28. ?HbA1C 8.6% ?Chol 230, TG 315, HDL 58, LDL N/A ? ?12/08/2019: ?Glucose 194, BUN/Cr 20/1.41. EGFR 43. Na/K 142/4.4. Rest of the CMP normal ?H/H 10/32. MCV 91. Platelets 355 ?Iron 24 mcg/dl (27-139) ?Iron sat 8% (15-55%) ? ?04/2019: ?TSH 4.2 normal ? ? ? ?Review of Systems  ?Cardiovascular:  Negative for chest pain, dyspnea on exertion, leg swelling, palpitations and syncope.  ? ?   ? ?Vitals:  ? 07/15/21 1009 07/15/21 1010  ?BP:    ?Pulse:    ?Resp:    ?Temp:    ?SpO2: 100% 91%  ? ? ? ?Body mass index is 32.78 kg/m?. ?Filed Weights  ? 07/15/21 1001  ?Weight: 179 lb  3.2 oz (81.3 kg)  ? ? ? ?Objective:  ? Physical Exam ?Vitals and nursing note reviewed.  ?Constitutional:   ?   General: She is not in acute distress. ?Neck:  ?   Vascular: No JVD.  ?Cardiovascular:  ?   Rate and Rhythm: Normal rate. Rhythm irregular.  ?   Heart sounds: Normal heart sounds. No murmur heard. ?Pulmonary:  ?   Effort: Pulmonary effort is normal.  ?   Breath sounds: Normal breath sounds. No wheezing or rales.  ?Musculoskeletal:  ?   Right lower leg: Edema (Trace) present.  ?   Left lower leg: Edema (trace) present.  ?Physical exam unchanged.  Previous office visit. ? ?   ? ?Assessment & Recommendations:  ? ?71 y.o. African American female with CAD, CKD s/p kidney transplant, hypertension, hyperlipidemia, type 2 DM, persistent Afib/flutter ? ?Paroxysmal Afib/ persistent atrial flutter: ?This patients CHA2DS2-VASc Score 5 (HTN, DM, Vasc, age, F)and yearly risk of stroke 7.2%.  ?She continues to tolerate Eliquis without bleeding diathesis, will continue this  ?Patient remains resistant to undergoing sleep study or wearing CPAP ?Patient has typically been followed with Dr. Virgina Jock and shared decision has been to  continue with rate control strategy and risk factor modification including obesity and OSA ?Suspect episode of RVR was because patient had independently discontinued all of her medications both cardiac and noncardiac. ?

## 2021-07-15 ENCOUNTER — Ambulatory Visit: Payer: Medicare Other | Admitting: Student

## 2021-07-15 ENCOUNTER — Encounter: Payer: Self-pay | Admitting: Student

## 2021-07-15 VITALS — BP 106/64 | HR 70 | Temp 97.5°F | Resp 16 | Ht 62.0 in | Wt 179.2 lb

## 2021-07-15 DIAGNOSIS — I4819 Other persistent atrial fibrillation: Secondary | ICD-10-CM

## 2021-07-15 DIAGNOSIS — I2510 Atherosclerotic heart disease of native coronary artery without angina pectoris: Secondary | ICD-10-CM

## 2021-07-15 DIAGNOSIS — I10 Essential (primary) hypertension: Secondary | ICD-10-CM | POA: Diagnosis not present

## 2021-07-16 ENCOUNTER — Other Ambulatory Visit: Payer: Self-pay | Admitting: Cardiology

## 2021-07-16 DIAGNOSIS — I480 Paroxysmal atrial fibrillation: Secondary | ICD-10-CM

## 2021-07-29 ENCOUNTER — Other Ambulatory Visit: Payer: Medicare Other

## 2021-08-01 ENCOUNTER — Other Ambulatory Visit: Payer: Self-pay | Admitting: Endocrinology

## 2021-08-01 ENCOUNTER — Other Ambulatory Visit: Payer: Self-pay

## 2021-08-01 DIAGNOSIS — E890 Postprocedural hypothyroidism: Secondary | ICD-10-CM

## 2021-08-01 DIAGNOSIS — E785 Hyperlipidemia, unspecified: Secondary | ICD-10-CM

## 2021-08-01 DIAGNOSIS — E1165 Type 2 diabetes mellitus with hyperglycemia: Secondary | ICD-10-CM

## 2021-08-07 ENCOUNTER — Ambulatory Visit: Payer: Medicare Other

## 2021-08-07 DIAGNOSIS — I4819 Other persistent atrial fibrillation: Secondary | ICD-10-CM

## 2021-08-07 DIAGNOSIS — I2510 Atherosclerotic heart disease of native coronary artery without angina pectoris: Secondary | ICD-10-CM

## 2021-08-08 NOTE — Progress Notes (Signed)
Called and spoke with patient regarding her recent echocardiogram results.

## 2021-08-15 NOTE — Progress Notes (Signed)
Called and spoke with patient, transferred call to ([phone]) front desk staff to schedule appointment with MP or CC.

## 2021-08-22 ENCOUNTER — Ambulatory Visit: Payer: Medicare Other | Admitting: Cardiology

## 2021-08-22 ENCOUNTER — Encounter: Payer: Self-pay | Admitting: Cardiology

## 2021-08-22 VITALS — BP 161/75 | HR 70 | Temp 98.3°F | Resp 16 | Ht 62.0 in | Wt 178.0 lb

## 2021-08-22 DIAGNOSIS — R9439 Abnormal result of other cardiovascular function study: Secondary | ICD-10-CM | POA: Diagnosis not present

## 2021-08-22 DIAGNOSIS — I4819 Other persistent atrial fibrillation: Secondary | ICD-10-CM | POA: Diagnosis not present

## 2021-08-22 DIAGNOSIS — I10 Essential (primary) hypertension: Secondary | ICD-10-CM | POA: Diagnosis not present

## 2021-08-22 DIAGNOSIS — I2510 Atherosclerotic heart disease of native coronary artery without angina pectoris: Secondary | ICD-10-CM

## 2021-08-22 NOTE — Progress Notes (Signed)
Patient referred by Antony Contras, MD for atrial flutter  Subjective:   Sandy Roy, female    DOB: May 04, 1950, 70 y.o.   MRN: [id number]   Chief Complaint  Patient presents with   Atrial Fibrillation   Hypertension   Follow-up     HPI  71 y.o. African American female with CAD, CKD s/p kidney transplant, hypertension, hyperlipidemia, type 2 DM, persistent Afib/flutter, abnormal stress test  Patient was hospitalized in January 2023 with COVID, A-fib RVR, was noted to have elevated troponin at this time.  Given risk factors and elevated troponin, she was recommended stress testing by Lawerance Cruel, PA-C.  This showed reversible ischemia in RCA territory.  At this time, patient has no complaints of chest pain.  Her primary complaint has been her diabetes, and episodes of hypoglycemia as noted on her glucose monitor.  As result, she ends up eating more food, which has subsequently led to weight gain.  She is trying to increase her physical activity by regular walking.     Current Outpatient Medications:    atorvastatin (LIPITOR) 10 MG tablet, Take 10 mg by mouth daily., Disp: , Rfl:    BD INSULIN SYRINGE U/F 31G X 5/16" 0.5 ML MISC, Inject 1 each into the skin daily at 6pm., Disp: 100 each, Rfl: 0   calcitRIOL (ROCALTROL) 0.5 MCG capsule, Take 0.5 mcg by mouth as directed. M/w/f, Disp: , Rfl:    cloNIDine (CATAPRES) 0.2 MG tablet, Take 0.1 mg by mouth daily., Disp: , Rfl:    Continuous Blood Gluc Sensor (FREESTYLE LIBRE 2 SENSOR) MISC, 1 Device by Does not apply route every 14 (fourteen) days., Disp: 6 each, Rfl: 3   ELIQUIS 5 MG TABS tablet, Take 1 tablet (5 mg total) by mouth 2 (two) times daily., Disp: 60 tablet, Rfl: 5   fish oil-omega-3 fatty acids 1000 MG capsule, Take 1 capsule by mouth daily., Disp: , Rfl:    glucose blood (ONETOUCH VERIO) test strip, 1 each by Other route 2 (two) times daily. And lancets 2/day Dx code: E11.9, Disp: 200 each, Rfl: 3   hydrALAZINE  (APRESOLINE) 50 MG tablet, Take 25 mg by mouth 3 (three) times daily., Disp: , Rfl:    insulin glargine (LANTUS) 100 UNIT/ML injection, Inject 0.18 mLs (18 Units total) into the skin every morning. And pen needles 1/day, Disp: 20 mL, Rfl: 1   Insulin Pen Needle (PEN NEEDLES) 33G X 4 MM MISC, Use to inject insulin 1 pen needle/day, Disp: 100 each, Rfl: 3   insulin regular (HUMULIN R) 100 units/mL injection, Inject 0.15 mLs (15 Units total) into the skin daily with breakfast., Disp: 20 mL, Rfl: 1   Insulin Syringe-Needle U-100 (B-D INSULIN SYRINGE) 31G X 5/16" 0.3 ML MISC, Use 1 time per day., Disp: 100 each, Rfl: 1   labetalol (NORMODYNE) 100 MG tablet, 2 in the morning 2 in the evening, Disp: , Rfl: 2   Lancets (ONETOUCH ULTRASOFT) lancets, Use to check blood sugar 2 times per day, Disp: 100 each, Rfl: 2   levothyroxine (SYNTHROID) 112 MCG tablet, Take 1 tablet (112 mcg total) by mouth daily., Disp: 90 tablet, Rfl: 3   lidocaine (LIDODERM) 5 %, Place 3 patches onto the skin daily. Remove & Discard patch within 12 hours or as directed by MD, Disp: , Rfl:    Multiple Vitamins-Minerals (MULTIVITAMIN WITH MINERALS) tablet, Take 1 tablet by mouth daily., Disp: , Rfl:    mycophenolate (MYFORTIC) 180 MG EC tablet, Take  180 mg by mouth 2 (two) times daily., Disp: , Rfl:    omeprazole (PRILOSEC) 20 MG capsule, Take 20 mg by mouth daily., Disp: , Rfl:    predniSONE (DELTASONE) 5 MG tablet, Take 5 mg by mouth daily with breakfast., Disp: , Rfl:    sulfamethoxazole-trimethoprim (BACTRIM,SEPTRA) 400-80 MG per tablet, 1 tablet. On Monday Wednesday and Friday, Disp: , Rfl:    tacrolimus (PROGRAF) 1 MG capsule, Take 4 mg by mouth. 6 tabs in the morning 6 tabs in the evening, Disp: , Rfl:    triazolam (HALCION) 0.25 MG tablet, 2 tablets by mouth at bedtime, Disp: , Rfl:    Cardiovascular and other pertinent studies:  Echocardiogram 08/07/2021:  Left ventricle cavity is normal in size. Moderate concentric  hypertrophy  of the left ventricle. Normal global wall motion. Normal LV systolic  function with visual EF 50-55%. Unable to evaluate diastolic function due  to atrial fibrillation.  Left atrial cavity is moderately dilated.  Structurally normal trileaflet aortic valve.  Mild (Grade I) aortic  regurgitation.  Mild (Grade I) mitral regurgitation.  Mild tricuspid regurgitation.  Small pericardial effusion. There is no hemodynamic significance.  No significant change compared to previous study on 10/02/2020.  Lexiscan Tetrofosmin stress test 08/07/2021: Abnormal nuclear stress test 1 Day Rest/Stress protocol.  Rest EKG illustrates atrial flutter & Stress EKG is non-diagnostic, as this is pharmacological stress test using Lexiscan and age-predicted maximal heart rate not achieved. Large size, moderate intensity, reversible perfusion defect involving the basal to distal inferior and mid inferolateral and apical lateral segments.  Findings suggestive of ischemia in the RCA /mid to distal LCx distribution. Mildly dilated left ventricle, wall thickness is preserved with mild hypokinesis of the inferior and inferolateral segments, calculated LVEF 66%. High risk stress test. No prior studies available for comparison.    EKG 10/10/2020: Atrial flutter with variable AV conduction Controlled ventricular rate Nonspecific T-abnormality  Echocardiogram 10/02/2020:  Normal LV systolic function with visual EF 60-65%. Left ventricle cavity  is normal in size. Mild to moderate left ventricular hypertrophy. Normal  global wall motion. Unable to evaluate diastolic function due to atrial  flutter. Elevated LAP.  Left atrial cavity is moderately dilated.  Mild to moderate mitral regurgitation.  Mild tricuspid regurgitation.  Mild pulmonic regurgitation.  Small pericardial effusion. There is no hemodynamic significance.  Compared to study 12/26/2019 no significant change.   Mobile cardiac telemetry 14 days  02/06/2020 - 02/20/2020:  Dominant rhythm: Atrial flutter/fibrillation  HR 48-144 bpm. Avg HR 75 bpm.  Afib/flutter burden 64%  Afib/flutter is asymptomatic.  Multiple episodes of atrial tachycardia, fastest at 133 bpm for 4 beats, longest for 7 beats at 118 bpm.  1.7% isolated SVE, <1% couplet/triplets.  <1% isolated VE, couplet/triplets.  No VT/high grade AV block, sinus pause >3sec noted.  1 patient triggered event, correlated with supraventricular ectopy.   Coronary angiogram 2012: 1. Abnormal left ventricular function with anteroapical defect compatible     with a previous infarction.  2. Diffusely narrowed and small distal LAD but no significant focal     obstructive stenoses noted.  3. Occlusion of the right coronary artery with collaterals from the left     coronary system.  EKG 02/06/2020: Wandering atrial pacemaker at the rate of 64 bpm.  Normal axis.  Poor R wave progression, cannot exclude anteroseptal infarct old.  Nonspecific T abnormality.    EKG 01/06/2020: Sinus rhythm 67 bpm with rate variation  Right atrial enlargement Baseline artifact  Recent  labs: 10/09/2020: Glucose 306, BUN/Cr 39/2.5. EGFR 20. Na/K 137/4.5. Rest of the CMP normal H/H 12/38. MCV 93. Platelets 304 HbA1C 11.8% Chol 220, TG 194, HDL 70, LDL 109 Mag 1.5  02/01/2020: Glucose 138, BUN/Cr 40/1.7. EGFR 28. HbA1C 8.6% Chol 230, TG 315, HDL 58, LDL N/A  12/08/2019: Glucose 194, BUN/Cr 20/1.41. EGFR 43. Na/K 142/4.4. Rest of the CMP normal H/H 10/32. MCV 91. Platelets 355 Iron 24 mcg/dl (27-139) Iron sat 8% (15-55%)  04/2019: TSH 4.2 normal    Review of Systems  Cardiovascular:  Negative for chest pain, dyspnea on exertion, leg swelling, palpitations and syncope.        There were no vitals filed for this visit.    There is no height or weight on file to calculate BMI. There were no vitals filed for this visit.    Objective:   Physical Exam Vitals and nursing note reviewed.   Constitutional:      General: She is not in acute distress. Neck:     Vascular: No JVD.  Cardiovascular:     Rate and Rhythm: Normal rate and regular rhythm.     Heart sounds: Normal heart sounds. No murmur heard. Pulmonary:     Effort: Pulmonary effort is normal.     Breath sounds: Normal breath sounds. No wheezing or rales.  Musculoskeletal:     Right lower leg: Edema (Trace) present.     Left lower leg: No edema.        Assessment & Recommendations:   71 y.o. African American female with CAD, CKD s/p kidney transplant, hypertension, hyperlipidemia, type 2 DM, persistent Afib/flutter, abnormal stress test  Abnormal stress test, CAD: Findings of ischemia in RCA territory, correlate with her known RCA CTO. No symptoms of angina at this time.  Cr is high. Recommend continuing medical management.  Not on Aspirin due to ongoing use of eliquis.  Continue Lipitor , increase to 20 mg.  Paroxysmal Afib/ persistent atrial flutter: Rate controlled. Recommend risk factor modification, especially obesity, OSA. Unfortunately, she does not want to undergo sleep study and wear CPAP.  CHA2DS2VASc score 4, annual stroke risk 5%. Continue eliquis 5 mg bid    Continue management of uncontrolled DM, CKD with endocrinology, nephrology.   F/u in 6 months   Nigel Mormon, MD Pager: [phone] Office: [phone]

## 2021-08-23 ENCOUNTER — Encounter: Payer: Self-pay | Admitting: Cardiology

## 2021-08-23 DIAGNOSIS — I4819 Other persistent atrial fibrillation: Secondary | ICD-10-CM | POA: Insufficient documentation

## 2021-08-23 DIAGNOSIS — R9439 Abnormal result of other cardiovascular function study: Secondary | ICD-10-CM | POA: Insufficient documentation

## 2021-08-23 MED ORDER — ATORVASTATIN CALCIUM 20 MG PO TABS: 20.0000 mg | ORAL_TABLET | Freq: Every day | ORAL | 3 refills | Status: DC

## 2021-08-26 DIAGNOSIS — J3800 Paralysis of vocal cords and larynx, unspecified: Secondary | ICD-10-CM | POA: Diagnosis not present

## 2021-08-26 DIAGNOSIS — Z9889 Other specified postprocedural states: Secondary | ICD-10-CM | POA: Diagnosis not present

## 2021-08-26 DIAGNOSIS — R490 Dysphonia: Secondary | ICD-10-CM | POA: Diagnosis not present

## 2021-08-29 ENCOUNTER — Other Ambulatory Visit: Payer: Self-pay

## 2021-08-29 DIAGNOSIS — E1165 Type 2 diabetes mellitus with hyperglycemia: Secondary | ICD-10-CM

## 2021-08-29 MED ORDER — "INSULIN SYRINGE-NEEDLE U-100 31G X 5/16"" 0.3 ML MISC": 3 refills | Status: DC

## 2021-09-23 DIAGNOSIS — Z940 Kidney transplant status: Secondary | ICD-10-CM | POA: Diagnosis not present

## 2021-10-08 DIAGNOSIS — Z7952 Long term (current) use of systemic steroids: Secondary | ICD-10-CM | POA: Diagnosis not present

## 2021-10-08 DIAGNOSIS — I2510 Atherosclerotic heart disease of native coronary artery without angina pectoris: Secondary | ICD-10-CM | POA: Diagnosis not present

## 2021-10-08 DIAGNOSIS — E119 Type 2 diabetes mellitus without complications: Secondary | ICD-10-CM | POA: Diagnosis not present

## 2021-10-08 DIAGNOSIS — Z940 Kidney transplant status: Secondary | ICD-10-CM | POA: Diagnosis not present

## 2021-10-08 DIAGNOSIS — Z5181 Encounter for therapeutic drug level monitoring: Secondary | ICD-10-CM | POA: Diagnosis not present

## 2021-10-08 DIAGNOSIS — E039 Hypothyroidism, unspecified: Secondary | ICD-10-CM | POA: Diagnosis not present

## 2021-10-08 DIAGNOSIS — Z888 Allergy status to other drugs, medicaments and biological substances status: Secondary | ICD-10-CM | POA: Diagnosis not present

## 2021-10-08 DIAGNOSIS — D849 Immunodeficiency, unspecified: Secondary | ICD-10-CM | POA: Diagnosis not present

## 2021-10-08 DIAGNOSIS — Z7982 Long term (current) use of aspirin: Secondary | ICD-10-CM | POA: Diagnosis not present

## 2021-10-08 DIAGNOSIS — Z4822 Encounter for aftercare following kidney transplant: Secondary | ICD-10-CM | POA: Diagnosis not present

## 2021-10-08 DIAGNOSIS — Z79899 Other long term (current) drug therapy: Secondary | ICD-10-CM | POA: Diagnosis not present

## 2021-10-08 DIAGNOSIS — Z7964 Long term (current) use of myelosuppressive agent: Secondary | ICD-10-CM | POA: Diagnosis not present

## 2021-10-08 DIAGNOSIS — Z792 Long term (current) use of antibiotics: Secondary | ICD-10-CM | POA: Diagnosis not present

## 2021-10-08 DIAGNOSIS — E785 Hyperlipidemia, unspecified: Secondary | ICD-10-CM | POA: Diagnosis not present

## 2021-10-08 DIAGNOSIS — I10 Essential (primary) hypertension: Secondary | ICD-10-CM | POA: Diagnosis not present

## 2021-10-08 DIAGNOSIS — E1169 Type 2 diabetes mellitus with other specified complication: Secondary | ICD-10-CM | POA: Diagnosis not present

## 2021-10-08 DIAGNOSIS — Z79621 Long term (current) use of calcineurin inhibitor: Secondary | ICD-10-CM | POA: Diagnosis not present

## 2021-10-08 LAB — LIPID PANEL: LDL Cholesterol: 99

## 2021-10-08 LAB — HEMOGLOBIN A1C: Hemoglobin A1C: 7

## 2021-10-09 DIAGNOSIS — Z79624 Long term (current) use of inhibitors of nucleotide synthesis: Secondary | ICD-10-CM | POA: Diagnosis not present

## 2021-10-09 DIAGNOSIS — Z4822 Encounter for aftercare following kidney transplant: Secondary | ICD-10-CM | POA: Diagnosis not present

## 2021-10-09 DIAGNOSIS — T464X5A Adverse effect of angiotensin-converting-enzyme inhibitors, initial encounter: Secondary | ICD-10-CM | POA: Diagnosis not present

## 2021-10-09 DIAGNOSIS — Z955 Presence of coronary angioplasty implant and graft: Secondary | ICD-10-CM | POA: Diagnosis not present

## 2021-10-09 DIAGNOSIS — Z7982 Long term (current) use of aspirin: Secondary | ICD-10-CM | POA: Diagnosis not present

## 2021-10-09 DIAGNOSIS — Z79621 Long term (current) use of calcineurin inhibitor: Secondary | ICD-10-CM | POA: Diagnosis not present

## 2021-10-09 DIAGNOSIS — E785 Hyperlipidemia, unspecified: Secondary | ICD-10-CM | POA: Diagnosis not present

## 2021-10-09 DIAGNOSIS — I4891 Unspecified atrial fibrillation: Secondary | ICD-10-CM | POA: Diagnosis not present

## 2021-10-09 DIAGNOSIS — T783XXA Angioneurotic edema, initial encounter: Secondary | ICD-10-CM | POA: Diagnosis not present

## 2021-10-09 DIAGNOSIS — Z940 Kidney transplant status: Secondary | ICD-10-CM | POA: Diagnosis not present

## 2021-10-09 DIAGNOSIS — Z79899 Other long term (current) drug therapy: Secondary | ICD-10-CM | POA: Diagnosis not present

## 2021-10-09 DIAGNOSIS — Z792 Long term (current) use of antibiotics: Secondary | ICD-10-CM | POA: Diagnosis not present

## 2021-10-09 DIAGNOSIS — Z7901 Long term (current) use of anticoagulants: Secondary | ICD-10-CM | POA: Diagnosis not present

## 2021-10-09 DIAGNOSIS — Z794 Long term (current) use of insulin: Secondary | ICD-10-CM | POA: Diagnosis not present

## 2021-10-09 DIAGNOSIS — Z85850 Personal history of malignant neoplasm of thyroid: Secondary | ICD-10-CM | POA: Diagnosis not present

## 2021-10-09 DIAGNOSIS — I2510 Atherosclerotic heart disease of native coronary artery without angina pectoris: Secondary | ICD-10-CM | POA: Diagnosis not present

## 2021-10-09 DIAGNOSIS — E890 Postprocedural hypothyroidism: Secondary | ICD-10-CM | POA: Diagnosis not present

## 2021-10-09 DIAGNOSIS — E119 Type 2 diabetes mellitus without complications: Secondary | ICD-10-CM | POA: Diagnosis not present

## 2021-10-09 DIAGNOSIS — I10 Essential (primary) hypertension: Secondary | ICD-10-CM | POA: Diagnosis not present

## 2021-10-09 DIAGNOSIS — Z7952 Long term (current) use of systemic steroids: Secondary | ICD-10-CM | POA: Diagnosis not present

## 2021-10-10 DIAGNOSIS — B0229 Other postherpetic nervous system involvement: Secondary | ICD-10-CM | POA: Diagnosis not present

## 2021-10-10 DIAGNOSIS — M150 Primary generalized (osteo)arthritis: Secondary | ICD-10-CM | POA: Diagnosis not present

## 2021-10-10 DIAGNOSIS — F32A Depression, unspecified: Secondary | ICD-10-CM | POA: Diagnosis not present

## 2021-10-10 DIAGNOSIS — G894 Chronic pain syndrome: Secondary | ICD-10-CM | POA: Diagnosis not present

## 2021-10-11 DIAGNOSIS — L609 Nail disorder, unspecified: Secondary | ICD-10-CM | POA: Diagnosis not present

## 2021-10-15 DIAGNOSIS — Z794 Long term (current) use of insulin: Secondary | ICD-10-CM | POA: Diagnosis not present

## 2021-10-15 DIAGNOSIS — Z7984 Long term (current) use of oral hypoglycemic drugs: Secondary | ICD-10-CM | POA: Diagnosis not present

## 2021-10-15 DIAGNOSIS — B351 Tinea unguium: Secondary | ICD-10-CM | POA: Diagnosis not present

## 2021-10-15 DIAGNOSIS — B0229 Other postherpetic nervous system involvement: Secondary | ICD-10-CM | POA: Diagnosis not present

## 2021-10-15 DIAGNOSIS — E1122 Type 2 diabetes mellitus with diabetic chronic kidney disease: Secondary | ICD-10-CM | POA: Diagnosis not present

## 2021-10-15 DIAGNOSIS — N184 Chronic kidney disease, stage 4 (severe): Secondary | ICD-10-CM | POA: Diagnosis not present

## 2021-10-23 DIAGNOSIS — M216X2 Other acquired deformities of left foot: Secondary | ICD-10-CM | POA: Diagnosis not present

## 2021-10-23 DIAGNOSIS — M216X1 Other acquired deformities of right foot: Secondary | ICD-10-CM | POA: Diagnosis not present

## 2021-10-23 DIAGNOSIS — E119 Type 2 diabetes mellitus without complications: Secondary | ICD-10-CM | POA: Diagnosis not present

## 2021-10-23 DIAGNOSIS — B351 Tinea unguium: Secondary | ICD-10-CM | POA: Diagnosis not present

## 2021-11-01 ENCOUNTER — Ambulatory Visit: Payer: Medicare Other | Admitting: Endocrinology

## 2021-11-01 ENCOUNTER — Telehealth: Payer: Self-pay

## 2021-11-01 ENCOUNTER — Other Ambulatory Visit: Payer: Self-pay | Admitting: Endocrinology

## 2021-11-01 ENCOUNTER — Other Ambulatory Visit (INDEPENDENT_AMBULATORY_CARE_PROVIDER_SITE_OTHER): Payer: Medicare Other

## 2021-11-01 DIAGNOSIS — E1165 Type 2 diabetes mellitus with hyperglycemia: Secondary | ICD-10-CM | POA: Diagnosis not present

## 2021-11-01 DIAGNOSIS — Z794 Long term (current) use of insulin: Secondary | ICD-10-CM

## 2021-11-01 LAB — RENAL FUNCTION PANEL
Albumin: 3.9 g/dL (ref 3.5–5.2)
BUN: 41 mg/dL — ABNORMAL HIGH (ref 6–23)
CO2: 31 mEq/L (ref 19–32)
Calcium: 8.6 mg/dL (ref 8.4–10.5)
Chloride: 104 mEq/L (ref 96–112)
Creatinine, Ser: 1.76 mg/dL — ABNORMAL HIGH (ref 0.40–1.20)
GFR: 28.42 mL/min — ABNORMAL LOW (ref >60.00)
Glucose, Bld: 39 mg/dL — CL (ref 70–99)
Phosphorus: 4.6 mg/dL (ref 2.3–4.6)
Potassium: 3.6 mEq/L (ref 3.5–5.1)
Sodium: 146 mEq/L — ABNORMAL HIGH (ref 135–145)

## 2021-11-01 NOTE — Telephone Encounter (Signed)
Lab called for critical. Patient glucose was 39. I tried calling patient on both numbers to make sure her blood sugar came back up, but no answer. Left voicemail for patient to call office back.

## 2021-11-02 LAB — FRUCTOSAMINE: Fructosamine: 238 umol/L (ref 0–285)

## 2021-11-04 ENCOUNTER — Ambulatory Visit: Payer: Medicare Other | Admitting: Endocrinology

## 2021-11-04 ENCOUNTER — Other Ambulatory Visit (INDEPENDENT_AMBULATORY_CARE_PROVIDER_SITE_OTHER): Payer: Medicare Other

## 2021-11-04 ENCOUNTER — Encounter: Payer: Self-pay | Admitting: Endocrinology

## 2021-11-04 VITALS — BP 146/64 | HR 79 | Ht 62.0 in | Wt 178.6 lb

## 2021-11-04 DIAGNOSIS — E890 Postprocedural hypothyroidism: Secondary | ICD-10-CM

## 2021-11-04 DIAGNOSIS — I10 Essential (primary) hypertension: Secondary | ICD-10-CM | POA: Diagnosis not present

## 2021-11-04 DIAGNOSIS — E1165 Type 2 diabetes mellitus with hyperglycemia: Secondary | ICD-10-CM | POA: Diagnosis not present

## 2021-11-04 DIAGNOSIS — E785 Hyperlipidemia, unspecified: Secondary | ICD-10-CM

## 2021-11-04 DIAGNOSIS — Z794 Long term (current) use of insulin: Secondary | ICD-10-CM

## 2021-11-04 LAB — TSH: TSH: 0.6 u[IU]/mL (ref 0.35–5.50)

## 2021-11-04 MED ORDER — INSULIN LISPRO 100 UNIT/ML IJ SOLN: 12.0000 [IU] | Freq: Three times a day (TID) | INTRAMUSCULAR | 2 refills | Status: DC

## 2021-11-04 NOTE — Patient Instructions (Signed)
STOP Humulin R insulin  To cover your meals you will be taking HUMALOG insulin before every meal You can skip the meals only if you are eating very low carbohydrate meal with very little starch or rich food  Take 10 units for cereal, 14 units for sandwiches and biscuits and at least 14 units for larger meals when eating out  We will need to aim for your blood sugar to be under about 180 after eating and adjust the dose 2 to 4 units if it is out of range  Stay on Lantus but continue 18 units only

## 2021-11-04 NOTE — Progress Notes (Signed)
Patient ID: Sandy Roy, female   DOB: Sep 28, 1950, 71 y.o.   MRN: [id number]            Reason for Appointment: Type II Diabetes follow-up   History of Present Illness    Diagnosis date: 1989  Previous history:  Non-insulin hypoglycemic drugs previously used: Unknown Insulin was started in 2015 Previously was on NPH insulin  A1c range in the last few years is: 6.7-12.5 Highest A1c was in 3/23  Recent history:     Non-insulin hypoglycemic drugs: Farxiga 10 mg daily     Insulin regimen:  Lantus 20 units daily, Humulin regular insulin 20 units at breakfast        Side effects from medications: None  Current self management, blood sugar patterns and problems identified:  A1c is 7% about a month ago elsewhere She takes 20 units of Humulin regular insulin in the morning usually right when she is eating but not at any other meals  Periodically appears to be getting low sugars either soon after breakfast or midday She will either eat cereal or a biscuit at breakfast However her blood sugars are generally rising currently over 200 after about 2-3 PM when she is trying to eat lunch Since her blood sugars are frequently high in the evening she may not eat a meal at that time  Do not do any regular exercise  She does take her Lantus regularly but usually late morning  Has not had any diabetes education in quite some time  Blood sugar in the lab was only 39 and she also had a low sugar of 53 on the sensor around the same time but apparently she took her insulin and did not eat Generally will eat a sandwich at lunchtime but may be eat out in the evening     Hypoglycemia: Has symptoms of sweating, all weakness, feeling off balance Off balance, sweats,   Glucometer: One Touch.           Review of freestyle libre sensor download shows the following analysis for the last 2 weeks  Blood sugars are on an average within the target range between midnight and about 4 PM  and then trending higher LOWEST blood sugars are midday between 12 PM and 2 PM on an average and HIGHEST between 4-6 PM OVERNIGHT blood sugars are moderately variable with occasional hyperglycemia but generally trending lower until about 6-7 AM averaging 115 and then leveling off HYPOGLYCEMIA seen only infrequently early morning and usually transient around 7 AM POSTPRANDIAL readings are generally fairly evenly controlled after breakfast averaging 1 230 with some tendency to hypoglycemia around 9 AM Blood sugars may also be periodically low around 12-2 PM Blood sugars after 2-3 PM are generally going up significantly to a variable extent but averaging highest late afternoon and gradually decreasing subsequently again  CGM use % of time 82  2-week average/GV 139/32  Time in range    78    %  % Time Above 180 18+1  % Time above 250   % Time Below 70 3     PRE-MEAL Fasting Lunch Dinner Bedtime Overall  Glucose range:       Averages: 119 116   139   POST-MEAL PC Breakfast PC Lunch PC Dinner  Glucose range:     Averages: 129 180 164   Dietician visit: Most recent: Years ago  Weight control:  Wt Readings from Last 3 Encounters:  11/04/21 178 lb 9.6 oz (  81 kg)  08/22/21 178 lb (80.7 kg)  07/15/21 179 lb 3.2 oz (81.3 kg)            Diabetes labs:  Lab Results  Component Value Date   HGBA1C 7.0 10/08/2021   HGBA1C 9.2 (A) 07/08/2021   HGBA1C 12.5 (A) 05/13/2021   Lab Results  Component Value Date   MICROALBUR 83.4 (H) 04/07/2013   LDLCALC 99 10/08/2021   CREATININE 1.76 (H) 11/01/2021    Lab Results  Component Value Date   FRUCTOSAMINE 238 11/01/2021   FRUCTOSAMINE 264 12/14/2018     Allergies as of 11/04/2021       Reactions   Lisinopril Swelling   Trazodone Hcl    Other reaction(s): depression        Medication List        Accurate as of November 04, 2021  9:23 PM. If you have any questions, ask your nurse or doctor.          STOP taking these  medications    insulin regular 100 units/mL injection Commonly known as: HumuLIN R Stopped by: Elayne Snare, MD       TAKE these medications    atorvastatin 20 MG tablet Commonly known as: LIPITOR Take 1 tablet (20 mg total) by mouth daily.   BD Insulin Syringe U/F 31G X 5/16" 0.5 ML Misc Generic drug: Insulin Syringe-Needle U-100 Inject 1 each into the skin daily at 6pm.   Insulin Syringe-Needle U-100 31G X 5/16" 0.3 ML Misc Commonly known as: B-D INSULIN SYRINGE Use 1 time per day.   calcitRIOL 0.5 MCG capsule Commonly known as: ROCALTROL Take 0.5 mcg by mouth as directed. M/w/f   cloNIDine 0.2 MG tablet Commonly known as: CATAPRES Take 0.1 mg by mouth daily.   Eliquis 5 MG Tabs tablet Generic drug: apixaban Take 1 tablet (5 mg total) by mouth 2 (two) times daily.   Farxiga 10 MG Tabs tablet Generic drug: dapagliflozin propanediol Take 10 mg by mouth daily.   fish oil-omega-3 fatty acids 1000 MG capsule Take 1 capsule by mouth daily.   FreeStyle Libre 2 Sensor Misc 1 Device by Does not apply route every 14 (fourteen) days.   hydrALAZINE 50 MG tablet Commonly known as: APRESOLINE Take 25 mg by mouth 3 (three) times daily.   insulin glargine 100 UNIT/ML injection Commonly known as: Lantus Inject 0.18 mLs (18 Units total) into the skin every morning. And pen needles 1/day What changed: how much to take   insulin lispro 100 UNIT/ML injection Commonly known as: HumaLOG Inject 0.12 mLs (12 Units total) into the skin 3 (three) times daily before meals. Started by: Elayne Snare, MD   labetalol 100 MG tablet Commonly known as: NORMODYNE 1 in the morning 1 in the evening   levothyroxine 112 MCG tablet Commonly known as: SYNTHROID Take 1 tablet (112 mcg total) by mouth daily.   lidocaine 5 % Commonly known as: LIDODERM Place 3 patches onto the skin daily. Remove & Discard patch within 12 hours or as directed by MD   multivitamin with minerals tablet Take 1  tablet by mouth daily.   mycophenolate 180 MG EC tablet Commonly known as: MYFORTIC Take 180 mg by mouth 2 (two) times daily.   omeprazole 20 MG capsule Commonly known as: PRILOSEC Take 20 mg by mouth daily.   onetouch ultrasoft lancets Use to check blood sugar 2 times per day   OneTouch Verio test strip Generic drug: glucose blood 1 each by Other  route 2 (two) times daily. And lancets 2/day Dx code: E11.9   Pen Needles 33G X 4 MM Misc Use to inject insulin 1 pen needle/day   predniSONE 5 MG tablet Commonly known as: DELTASONE Take 5 mg by mouth daily with breakfast.   sulfamethoxazole-trimethoprim 400-80 MG tablet Commonly known as: BACTRIM 1 tablet. On Monday Wednesday and Friday   tacrolimus 1 MG capsule Commonly known as: PROGRAF Take 4 mg by mouth. 6 tabs in the morning 6 tabs in the evening   triazolam 0.25 MG tablet Commonly known as: HALCION 2 tablets by mouth at bedtime        Allergies:  Allergies  Allergen Reactions   Lisinopril Swelling   Trazodone Hcl     Other reaction(s): depression    Past Medical History:  Diagnosis Date   Chronic kidney disease    ESRD-  [place]- M_W_F   CONGESTIVE HEART FAILURE 12/14/2006   CORONARY ARTERY DISEASE 12/14/2006   Depression    not recently   DIABETES MELLITUS, TYPE II 12/14/2006   Type 2   HYPERLIPIDEMIA 12/14/2006   HYPERTENSION 12/14/2006   Hypoparathyroidism ([place]) 12/21/2006   Hypopotassemia 01/13/2007   HYPOTHYROIDISM, POSTSURGICAL 12/15/2006   NEOPLASM, MALIGNANT, THYROID GLAND 12/15/2006   Stage 1 papillary adenocarcinoma, one very small focus each lobe (14m, 264m  6/09: CT neck: no evidence of recurrence   OBSTRUCTIVE SLEEP APNEA 02/23/2007   does not use a cpap   Orbital fracture (HCC)    left eye   Shortness of breath dyspnea    with exertion    Past Surgical History:  Procedure Laterality Date   [place] 02/07[place]   Procedure: FIRST STAGE BASILIC VEIN TRANSPOSITION;   Surgeon: BrConrad BurlingtonMD;  [facility];  Service: Vascular;  Laterality: Right;   [place] 05/30/2014   Procedure: SECOND STAGE BASILIC VEIN TRANSPOSITION;  Surgeon: BrConrad BurlingtonMD;  Location: [facility] OR;  Service: Vascular;  Laterality: Right;   BREAST SURGERY Left    nipple leaking, had surgery to fix the leak   CARDIAC CATHETERIZATION     ? 10 years ago   [place]LECYST[place]Y     [id number]   TONSILLECTOMY     TUBAL LIGATION      Family History  Problem Relation Age of Onset   Heart failure Mother    Diabetes Mother    Cancer Father    Heart attack Brother    COPD Brother    Drug abuse Brother    Cancer Other    COPD Other    Hypertension Other    Hyperlipidemia Other    Stroke Other    Heart disease Other    Diabetes Other     Social History:  reports that she quit smoking about 23 years ago. Her smoking use included cigarettes. She has a 2.50 pack-year smoking history. She has never used smokeless tobacco. She reports that she does not drink alcohol and does not use drugs.  Review of Systems:  Last diabetic eye exam date?  Last urine microalbumin date:?  Last foot exam date: 10/22  Symptoms of neuropathy: tingling  Hypertension: Treated with hydralazine and Catapres ACE/ARB medication: None  BP Readings from Last 3 Encounters:  11/04/21 (!) 146/64  08/22/21 (!) 161/75  07/15/21 106/64   Followed by cardiologist for paroxysmal atrial fibrillation  Lipid management:  Treated with 20 mg atorvastatin, last LDL 116 elsewhere    Lab Results  Component Value Date   CHOL 400 (H) 01/17/2015   CHOL 141 03/28/2014   CHOL 330 03/18/2010   Lab Results  Component Value Date   HDL 64.90 01/17/2015   HDL 47.30 03/28/2014   HDL 47 03/18/2010   Lab Results  Component Value Date   LDLCALC 99 10/08/2021   LDLCALC 59 03/28/2014   Lab Results  Component Value Date    TRIG 246.0 (H) 01/17/2015   TRIG 172.0 (H) 03/28/2014   TRIG 438 03/18/2010   Lab Results  Component Value Date   CHOLHDL 6 01/17/2015   CHOLHDL 3 03/28/2014   Lab Results  Component Value Date   LDLDIRECT 273.0 01/17/2015   LDLDIRECT 164 03/18/2010            Fib-4 interpretation is not validated for people under 51 or over 54 years of age.  HYPOTHYROIDISM post thyroidectomy in 2008:  Her thyroid surgery showed 2 small foci of papillary cancer and follow-up ultrasound in 2021 showed only small residual thyroid tissue  She has been on 112 mcg levothyroxine for the last few years No recent labs available   Lab Results  Component Value Date   TSH 0.60 11/04/2021   TSH 4.20 04/26/2019   TSH 6.03 (H) 12/14/2018   FREET4 0.83 12/14/2018   FREET4 1.09 01/13/2017   FREET4 1.06 03/18/2010      Examination:   BP (!) 146/64   Pulse 79   Ht '5\' 2"'$  (1.575 m)   Wt 178 lb 9.6 oz (81 kg)   SpO2 95%   BMI 32.67 kg/m   Body mass index is 32.67 kg/m.   Thyroid not palpable No lymphadenopathy in the neck   ASSESSMENT/ PLAN:    Diabetes type 2 insulin requiring and longstanding Recent BMI about 33  Current regimen: Lantus and regular insulin once a day  See history of present illness for detailed discussion of current diabetes management, blood sugar patterns and problems identified  A1c is recently 7% and fructosamine is fairly good at 238 As discussed above she is not on a physiological insulin regimen with unusually large dose of regular insulin and relatively smaller dose of Lantus She is periodically getting hypoglycemic from her regular insulin and has no mealtime coverage available for lunch and dinner Partly because of her renal insufficiency her regular insulin is lasting longer than expected also and the timing of her medication is inappropriate also  Recommendations:  She will stop regular insulin as this is not providing consistent control of her blood sugars  at breakfast time and also with 20 units twice causing hypoglycemia; she also has difficulty trying to do this 30 minutes before eating and prefers to take the insulin right before eating Discussed the need to switch regular insulin to analog insulin which she can take right before each meal Also discussed in detail the difference between regular insulin and analog insulin, she prefers to take insulin with a syringe Have discussed in detail the need for mealtime insulin with each meal to cover postprandial spikes, action of mealtime insulin, use of the insulin pen, timing and action of the rapid acting insulin as well as starting dose and dosage titration to target the two-hour reading of under 180 Chart for insulin doses and timing of insulin was given She can generally take 10 to 14 units of Humalog with 14 units for sandwiches and eating out and 10  units for smaller meals or cereal in the morning She will reduce her Lantus to 18 as she has occasional early morning low sugars She will need to have follow-up with diabetes educator to fine-tune the insulin doses as well as needs significant amount of diabetes education regarding use of insulin and insulin management as well as meal planning  HYPOTHYROIDISM post thyroidectomy: She does not appear to have had any TSH checked in 2 years and will add on today  History of hyperlipidemia: Followed by other physicians, no recent labs available but most recent LDL is 99  CKD: Followed by transplant team  HYPERTENSION: Followed by nephrology and cardiology   Patient Instructions  STOP Humulin R insulin  To cover your meals you will be taking HUMALOG insulin before every meal You can skip the meals only if you are eating very low carbohydrate meal with very little starch or rich food  Take 10 units for cereal, 14 units for sandwiches and biscuits and at least 14 units for larger meals when eating out  We will need to aim for your blood sugar to be  under about 180 after eating and adjust the dose 2 to 4 units if it is out of range  Stay on Lantus but continue 18 units only   Total visit time for evaluation and management of multiple problems, review of extensive records and labs and counseling = 40 minutes  Olsen Mccutchan 11/04/2021, 9:23 PM   ADDENDUM: TSH 0.6, she will continue same dose but only take half a pill on Sundays

## 2021-11-06 ENCOUNTER — Encounter: Payer: Self-pay | Admitting: Endocrinology

## 2021-11-18 ENCOUNTER — Encounter: Payer: Medicare Other | Attending: Endocrinology | Admitting: Nutrition

## 2021-11-18 DIAGNOSIS — E119 Type 2 diabetes mellitus without complications: Secondary | ICD-10-CM | POA: Diagnosis not present

## 2021-11-19 NOTE — Patient Instructions (Addendum)
May keep insulin in purse without having to keep this "cold" when eating out.  Make sure to take this before eating lunch 2.   Treat low blood sugars with only 1 small can of soda.  Test blood sugar with finger stick 20 minutes        After treating low blood sugars 3.  Have 3 meals and protein, carbohydrate and small amount of fat at each meal--Follow meal plan given       And call if questions 4.  Try to do 15-20 minutes of chair exercises in one day-may break this up to 5 minutes 4X/day

## 2021-11-19 NOTE — Progress Notes (Signed)
Patient is here today to review diet and insulin doses. Insulin:  lantus: 15u qAM (8-8:30AM).  Says takes this QD              Humalog:  12u ac meals- sometimes does not take this due to eating out, and does not know how to                               keep insulin cold, or forgets this when going out in the AM SBGM:  Libre 2-readings going to reader.  Does not want phone for readings.  Pattern shows blood sugars going very high after breakfast, but dropping quickly and usually will be 97 at Noon, and in the mid 60s by 1-2 PM.  Blood sugar rises some around 8PM, but steady after that with FBS; in low 100s.  Exercise:  none Diet:  Typical day: 8-8:30AM  Up and takes Thyroid medication.  Goes back to sleep 9:30-10AM:   Blood sugar:  typically:  110-120:  FBS today : 123.            Bfast:  12u Humalog:  Bfast:  Chereos with banana, or Frosted flakes with banana.  Blood sugar                        2hr. Pc today after Frosted flakes: 115.                         2X/wk., will have 3 eggs, with 2 pieces of toast, 1/2 glass of milk, or water.  Bacon-2 pieces                        Rarely. 2PM: Lunch:  12u of Humalog when eating at home:  [place] sandwich with occasional chips, crystal                                Light, or water to drink.  Usually with go out and rarely takes her insulin with her due to heat                       Will have steak-3-4 ounces, baked potato, salad with dressing, and roll with water-14u for                        This meal when she gets home. Admits to occassional deserts at this time 1-2 X/wk. 6PM Supper. This is rare.  Usually with eat nothing,  Denies snacking late in evening Discussion:  Need for balanced meals of protein, carbohydrates and fat at each meal.  Discussed what foods each f group contains and yellow card given with portions sizes of each food group. Need for 3 meals to spead calories over day.  Meal plan given 1600 -1800 calorie meal plan given of 3  meals.   Need to adjust Humalog dose based on meal size and quantities of carbohydrate at each meal.  Discussion of need to adjust dosage higher when eating desert.  Eating out suggestions given for lower fat choices No need to keep insulin cold.  Just not having temperature go over 86 degrees.  Discussion of taking insulin ac meals at table after ordering to time  for 15 minutes before the  meal, or right when meal arrives if blood sugar is in the 70s or below.  Low blood sugar treatments.  Pt. Ususally uses small soda can of 6 ounces Coke.  Discussed need to treat with no more than this, and how sensor reading shows low reading when blood sugar level is actually higher. Need for exercise:  chair exercise examples given

## 2021-12-02 ENCOUNTER — Other Ambulatory Visit: Payer: Self-pay

## 2021-12-02 ENCOUNTER — Other Ambulatory Visit: Payer: Self-pay | Admitting: Endocrinology

## 2021-12-02 DIAGNOSIS — E1165 Type 2 diabetes mellitus with hyperglycemia: Secondary | ICD-10-CM

## 2021-12-02 MED ORDER — INSULIN GLARGINE 100 UNIT/ML ~~LOC~~ SOLN: SUBCUTANEOUS | 1 refills | Status: DC

## 2021-12-02 MED ORDER — INSULIN LISPRO 100 UNIT/ML IJ SOLN: INTRAMUSCULAR | 2 refills | Status: DC

## 2021-12-02 MED ORDER — "INSULIN SYRINGE-NEEDLE U-100 31G X 5/16"" 0.3 ML MISC": 3 refills | Status: DC

## 2021-12-13 DIAGNOSIS — M67911 Unspecified disorder of synovium and tendon, right shoulder: Secondary | ICD-10-CM | POA: Diagnosis not present

## 2021-12-19 DIAGNOSIS — M25511 Pain in right shoulder: Secondary | ICD-10-CM | POA: Diagnosis not present

## 2021-12-23 DIAGNOSIS — Z940 Kidney transplant status: Secondary | ICD-10-CM | POA: Diagnosis not present

## 2021-12-24 DIAGNOSIS — Z940 Kidney transplant status: Secondary | ICD-10-CM | POA: Diagnosis not present

## 2021-12-24 DIAGNOSIS — I129 Hypertensive chronic kidney disease with stage 1 through stage 4 chronic kidney disease, or unspecified chronic kidney disease: Secondary | ICD-10-CM | POA: Diagnosis not present

## 2021-12-24 DIAGNOSIS — E782 Mixed hyperlipidemia: Secondary | ICD-10-CM | POA: Diagnosis not present

## 2021-12-24 DIAGNOSIS — E039 Hypothyroidism, unspecified: Secondary | ICD-10-CM | POA: Diagnosis not present

## 2021-12-24 DIAGNOSIS — I480 Paroxysmal atrial fibrillation: Secondary | ICD-10-CM | POA: Diagnosis not present

## 2021-12-24 DIAGNOSIS — E1122 Type 2 diabetes mellitus with diabetic chronic kidney disease: Secondary | ICD-10-CM | POA: Diagnosis not present

## 2021-12-24 DIAGNOSIS — B0229 Other postherpetic nervous system involvement: Secondary | ICD-10-CM | POA: Diagnosis not present

## 2021-12-24 DIAGNOSIS — N184 Chronic kidney disease, stage 4 (severe): Secondary | ICD-10-CM | POA: Diagnosis not present

## 2021-12-24 DIAGNOSIS — I2510 Atherosclerotic heart disease of native coronary artery without angina pectoris: Secondary | ICD-10-CM | POA: Diagnosis not present

## 2021-12-24 DIAGNOSIS — R609 Edema, unspecified: Secondary | ICD-10-CM | POA: Diagnosis not present

## 2021-12-24 DIAGNOSIS — Z23 Encounter for immunization: Secondary | ICD-10-CM | POA: Diagnosis not present

## 2021-12-30 DIAGNOSIS — M75111 Incomplete rotator cuff tear or rupture of right shoulder, not specified as traumatic: Secondary | ICD-10-CM | POA: Diagnosis not present

## 2021-12-31 ENCOUNTER — Other Ambulatory Visit: Payer: Self-pay

## 2021-12-31 DIAGNOSIS — E890 Postprocedural hypothyroidism: Secondary | ICD-10-CM

## 2021-12-31 MED ORDER — LEVOTHYROXINE SODIUM 112 MCG PO TABS: ORAL_TABLET | ORAL | 3 refills | Status: DC

## 2022-01-09 DIAGNOSIS — H25813 Combined forms of age-related cataract, bilateral: Secondary | ICD-10-CM | POA: Diagnosis not present

## 2022-01-09 DIAGNOSIS — H43813 Vitreous degeneration, bilateral: Secondary | ICD-10-CM | POA: Diagnosis not present

## 2022-01-09 DIAGNOSIS — E119 Type 2 diabetes mellitus without complications: Secondary | ICD-10-CM | POA: Diagnosis not present

## 2022-01-16 ENCOUNTER — Ambulatory Visit: Payer: Medicare Other

## 2022-01-16 VITALS — BP 109/70 | HR 69 | Temp 97.6°F | Ht 62.0 in | Wt 176.2 lb

## 2022-01-16 DIAGNOSIS — I2510 Atherosclerotic heart disease of native coronary artery without angina pectoris: Secondary | ICD-10-CM

## 2022-01-16 DIAGNOSIS — R9439 Abnormal result of other cardiovascular function study: Secondary | ICD-10-CM | POA: Diagnosis not present

## 2022-01-16 DIAGNOSIS — I480 Paroxysmal atrial fibrillation: Secondary | ICD-10-CM | POA: Diagnosis not present

## 2022-01-16 NOTE — Addendum Note (Signed)
Addended by: Ernst Spell on: 01/16/2022 08:56 PM   Modules accepted: Level of Service

## 2022-01-16 NOTE — Progress Notes (Signed)
Patient referred by Antony Contras, MD for atrial flutter  Subjective:   Sandy Roy, female    DOB: Jul 29, 1950, 71 y.o.   MRN: [id number]   Chief Complaint  Patient presents with   Atrial Fibrillation   Follow-up   Dizziness   HPI  71 y.o. African American female with CAD, CKD s/p kidney transplant, hypertension, hyperlipidemia, type 2 DM, persistent Afib/flutter, abnormal stress test  Patient was hospitalized in January 2023 with COVID, A-fib RVR, was noted to have elevated troponin at this time.  Given risk factors and elevated troponin, she was recommended stress testing by Lawerance Cruel, PA-C.  This showed reversible ischemia in RCA territory.  At this time, patient has no complaints of chest pain.  Her primary complaint has been her diabetes, and episodes of hypoglycemia as noted on her glucose monitor.  As result, she ends up eating more food, which has subsequently led to weight gain.  She is trying to increase her physical activity by regular walking.  She presents to the office today for 42-monthfollow-up.  Overall she is stable from a cardiac standpoint without any complaints.  Her only complaint today is fatigue and dizziness related to hypoglycemia. She denies chest pain, shortness of breath, palpitations, leg edema, orthopnea, PND.    Current Outpatient Medications:    atorvastatin (LIPITOR) 20 MG tablet, Take 1 tablet (20 mg total) by mouth daily., Disp: 90 tablet, Rfl: 3   BD INSULIN SYRINGE U/F 31G X 5/16" 0.5 ML MISC, Inject 1 each into the skin daily at 6pm., Disp: 100 each, Rfl: 0   calcitRIOL (ROCALTROL) 0.5 MCG capsule, Take 0.5 mcg by mouth as directed. M/w/f, Disp: , Rfl:    cloNIDine (CATAPRES) 0.2 MG tablet, Take 0.1 mg by mouth daily., Disp: , Rfl:    Continuous Blood Gluc Sensor (FREESTYLE LIBRE 2 SENSOR) MISC, 1 Device by Does not apply route every 14 (fourteen) days., Disp: 6 each, Rfl: 3   ELIQUIS 5 MG TABS tablet, Take 1 tablet (5 mg total) by  mouth 2 (two) times daily., Disp: 60 tablet, Rfl: 5   FARXIGA 10 MG TABS tablet, Take 10 mg by mouth daily., Disp: , Rfl:    fish oil-omega-3 fatty acids 1000 MG capsule, Take 1 capsule by mouth daily., Disp: , Rfl:    furosemide (LASIX) 40 MG tablet, Take 40 mg by mouth 2 (two) times daily., Disp: , Rfl:    glucose blood (ONETOUCH VERIO) test strip, 1 each by Other route 2 (two) times daily. And lancets 2/day Dx code: E11.9, Disp: 200 each, Rfl: 3   hydrALAZINE (APRESOLINE) 50 MG tablet, Take 25 mg by mouth 3 (three) times daily., Disp: , Rfl:    insulin glargine (LANTUS) 100 UNIT/ML injection, Inject 18 units into skin every morning., Disp: 20 mL, Rfl: 1   insulin lispro (HUMALOG) 100 UNIT/ML injection, Take 10-14 units before each meal, Disp: 40 mL, Rfl: 2   Insulin Pen Needle (PEN NEEDLES) 33G X 4 MM MISC, Use to inject insulin 1 pen needle/day, Disp: 100 each, Rfl: 3   Insulin Syringe-Needle U-100 (BD INSULIN SYRINGE U/F) 31G X 5/16" 0.3 ML MISC, Use with insulin 4 times daily, Disp: 400 each, Rfl: 3   labetalol (NORMODYNE) 100 MG tablet, 1 in the morning 1 in the evening, Disp: , Rfl: 2   Lancets (ONETOUCH ULTRASOFT) lancets, Use to check blood sugar 2 times per day, Disp: 100 each, Rfl: 2   levothyroxine (SYNTHROID) 112 MCG  tablet, Take 1 whole tablet 6 days a week and 0.5 tablet 1 day a week., Disp: 90 tablet, Rfl: 3   lidocaine (LIDODERM) 5 %, Place 3 patches onto the skin daily. Remove & Discard patch within 12 hours or as directed by MD, Disp: , Rfl:    Multiple Vitamins-Minerals (MULTIVITAMIN WITH MINERALS) tablet, Take 1 tablet by mouth daily., Disp: , Rfl:    mycophenolate (MYFORTIC) 180 MG EC tablet, Take 180 mg by mouth 2 (two) times daily., Disp: , Rfl:    omeprazole (PRILOSEC) 20 MG capsule, Take 20 mg by mouth daily., Disp: , Rfl:    predniSONE (DELTASONE) 5 MG tablet, Take 5 mg by mouth daily with breakfast., Disp: , Rfl:    sulfamethoxazole-trimethoprim (BACTRIM,SEPTRA) 400-80  MG per tablet, 1 tablet. On Monday Wednesday and Friday, Disp: , Rfl:    tacrolimus (PROGRAF) 1 MG capsule, Take 4 mg by mouth. 6 tabs in the morning 6 tabs in the evening, Disp: , Rfl:    triazolam (HALCION) 0.25 MG tablet, 2 tablets by mouth at bedtime, Disp: , Rfl:    Cardiovascular and other pertinent studies:  Echocardiogram 08/07/2021:  Left ventricle cavity is normal in size. Moderate concentric hypertrophy  of the left ventricle. Normal global wall motion. Normal LV systolic  function with visual EF 50-55%. Unable to evaluate diastolic function due  to atrial fibrillation.  Left atrial cavity is moderately dilated.  Structurally normal trileaflet aortic valve.  Mild (Grade I) aortic  regurgitation.  Mild (Grade I) mitral regurgitation.  Mild tricuspid regurgitation.  Small pericardial effusion. There is no hemodynamic significance.  No significant change compared to previous study on 10/02/2020.  Lexiscan Tetrofosmin stress test 08/07/2021: Abnormal nuclear stress test 1 Day Rest/Stress protocol.  Rest EKG illustrates atrial flutter & Stress EKG is non-diagnostic, as this is pharmacological stress test using Lexiscan and age-predicted maximal heart rate not achieved. Large size, moderate intensity, reversible perfusion defect involving the basal to distal inferior and mid inferolateral and apical lateral segments.  Findings suggestive of ischemia in the RCA /mid to distal LCx distribution. Mildly dilated left ventricle, wall thickness is preserved with mild hypokinesis of the inferior and inferolateral segments, calculated LVEF 66%. High risk stress test. No prior studies available for comparison.   Mobile cardiac telemetry 14 days 02/06/2020 - 02/20/2020:  Dominant rhythm: Atrial flutter/fibrillation  HR 48-144 bpm. Avg HR 75 bpm.  Afib/flutter burden 64%  Afib/flutter is asymptomatic.  Multiple episodes of atrial tachycardia, fastest at 133 bpm for 4 beats, longest for 7  beats at 118 bpm.  1.7% isolated SVE, <1% couplet/triplets.  <1% isolated VE, couplet/triplets.  No VT/high grade AV block, sinus pause >3sec noted.  1 patient triggered event, correlated with supraventricular ectopy.   Coronary angiogram 2012: 1. Abnormal left ventricular function with anteroapical defect compatible     with a previous infarction.  2. Diffusely narrowed and small distal LAD but no significant focal     obstructive stenoses noted.  3. Occlusion of the right coronary artery with collaterals from the left     coronary system.  EKG 01/16/2022: Sinus arrhythmia at rate of 68 bpm.  Left axis deviation.  Nonspecific T wave abnormality, old anteroseptal infarct.  Compared to previous EKG on 07/27/2021, sinus arrhythmia replaces atrial flutter.  Recent labs: 11/01/2021: Glucose 39, BUN/Cr 41/1.76. EGFR 28. Na/K 146/3.6. Rest of the CMP normal TSH 0.60  10/08/2021: LDL 99 H/H 11.9/35. MCV 89.6. Platelets 299  10/09/2020: Glucose 306, BUN/Cr 39/2.5.  EGFR 20. Na/K 137/4.5. Rest of the CMP normal H/H 12/38. MCV 93. Platelets 304 HbA1C 11.8% Chol 220, TG 194, HDL 70, LDL 109 Mag 1.5   Review of Systems  Constitutional: Positive for malaise/fatigue.  Cardiovascular:  Negative for chest pain, dyspnea on exertion, leg swelling, palpitations and syncope.  Neurological:  Positive for dizziness.    Vitals:   01/16/22 1153 01/16/22 1155  BP:    Pulse:    Temp:    SpO2: 95% 92%   Orthostatic VS for the past 72 hrs (Last 3 readings):  Orthostatic BP Patient Position BP Location Cuff Size Orthostatic Pulse  01/16/22 1155 103/48 Standing Left Arm Large 68  01/16/22 1153 110/47 Sitting Left Arm Large 70  01/16/22 1152 121/60 Supine Left Arm Large 72    Body mass index is 32.23 kg/m. Filed Weights   01/16/22 1145  Weight: 176 lb 3.2 oz (79.9 kg)    Objective:   Physical Exam Vitals and nursing note reviewed.  Constitutional:      General: She is not in acute  distress. Neck:     Vascular: No JVD.  Cardiovascular:     Rate and Rhythm: Normal rate and regular rhythm.     Heart sounds: Normal heart sounds. No murmur heard. Pulmonary:     Effort: Pulmonary effort is normal.     Breath sounds: Normal breath sounds. No wheezing or rales.  Musculoskeletal:     Right lower leg: No edema.     Left lower leg: No edema.     Assessment & Recommendations:   71 y.o. African American female with CAD, CKD s/p kidney transplant, hypertension, hyperlipidemia, type 2 DM, persistent Afib/flutter, abnormal stress test  Paroxysmal A-fib (HCC) EKG today revealed sinus arrhythmia. Recommend risk factor modification, especially obesity, OSA. Unfortunately, she does not want to undergo sleep study and wear CPAP.  CHA2DS2VASc score 4, annual stroke risk 5%. She continues on Eliquis 5 mg twice daily without bleeding diathesis.  Coronary artery disease involving native coronary artery of native heart without angina pectoris Abnormal stress test Findings of ischemia in RCA territory, correlate with her known RCA CTO. No symptoms of angina at this time. Recommend continuing medical management. Not on Aspirin due to ongoing use of eliquis.  She continues on atorvastatin 20 mg daily.  Today she does feel fatigue and lightheadedness.  She was orthostatic.  She feels like her blood sugar was low prior to coming into the office.  She did stop and drink half a soda.  She has not had much to eat or drink today.  Symptoms she was having today is are consistent with symptoms she has had low blood sugar.  I did check her blood sugar in the office and it was 133.  She was offered a snack but she declined and she was given water.  Prior to leaving office visit she did report feeling better.  I have advised her to follow-up with endocrinology related to hypoglycemic events.  Continue management of uncontrolled DM, CKD with endocrinology, nephrology.   F/u in 6  months   Ernst Spell, [place] Pager: [phone] Office: [phone]

## 2022-01-21 ENCOUNTER — Other Ambulatory Visit: Payer: Self-pay | Admitting: Cardiology

## 2022-01-21 DIAGNOSIS — I480 Paroxysmal atrial fibrillation: Secondary | ICD-10-CM

## 2022-02-07 DIAGNOSIS — Z940 Kidney transplant status: Secondary | ICD-10-CM | POA: Diagnosis not present

## 2022-02-07 DIAGNOSIS — I10 Essential (primary) hypertension: Secondary | ICD-10-CM | POA: Diagnosis not present

## 2022-02-07 DIAGNOSIS — I4891 Unspecified atrial fibrillation: Secondary | ICD-10-CM | POA: Diagnosis not present

## 2022-02-07 DIAGNOSIS — B348 Other viral infections of unspecified site: Secondary | ICD-10-CM | POA: Diagnosis not present

## 2022-02-07 DIAGNOSIS — E119 Type 2 diabetes mellitus without complications: Secondary | ICD-10-CM | POA: Diagnosis not present

## 2022-02-10 DIAGNOSIS — M67911 Unspecified disorder of synovium and tendon, right shoulder: Secondary | ICD-10-CM | POA: Diagnosis not present

## 2022-02-24 ENCOUNTER — Other Ambulatory Visit: Payer: Self-pay

## 2022-02-24 MED ORDER — FREESTYLE LIBRE 2 SENSOR MISC: 1.0000 | 3 refills | Status: DC

## 2022-02-25 ENCOUNTER — Ambulatory Visit: Payer: Medicare Other | Admitting: Endocrinology

## 2022-03-19 IMAGING — US US THYROID
1 series · 14 of 25 positions shown · non-contrast
Comparison: None.

CLINICAL DATA: Other. 69-year-old female status post total
thyroidectomy in 9558.

EXAM:
THYROID ULTRASOUND
TECHNIQUE: Ultrasound examination of the thyroid gland and adjacent soft
tissues was performed.

[Series 1: us thyroid · 0.05mm/px · 14 of 37 slices shown]
[im 1/37]
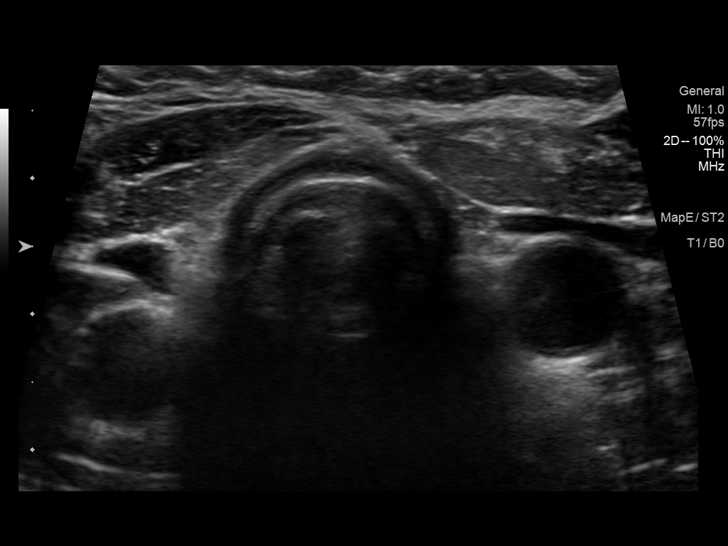
[im 4/37]
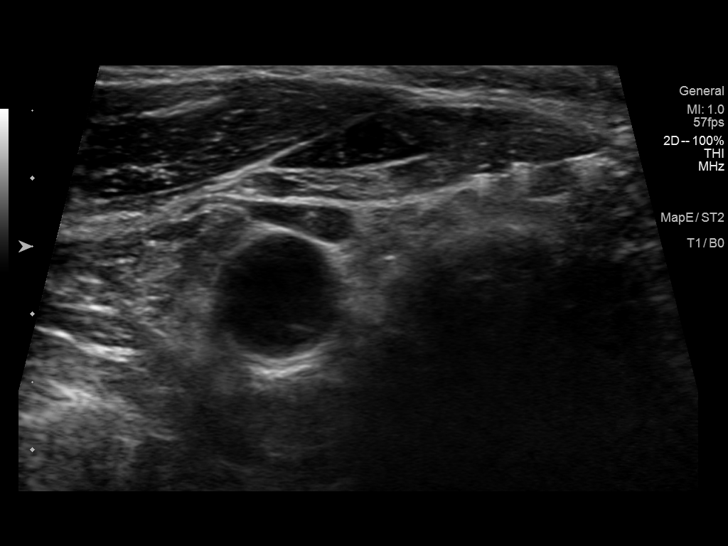
[im 7/37]
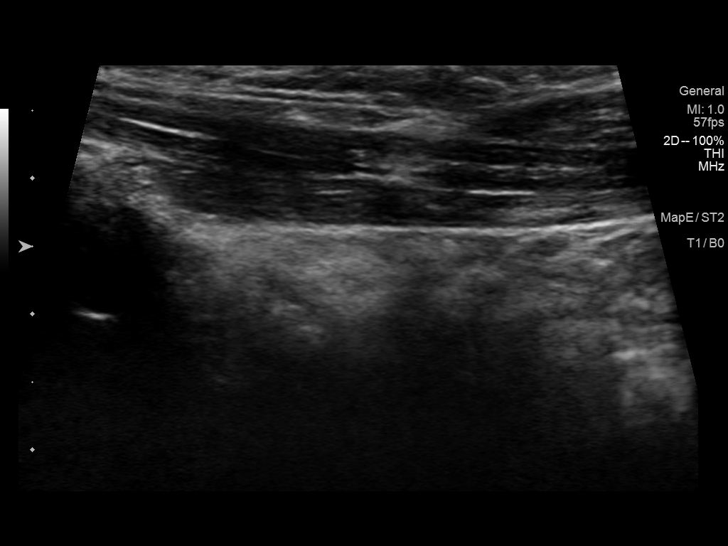
[im 10/37]
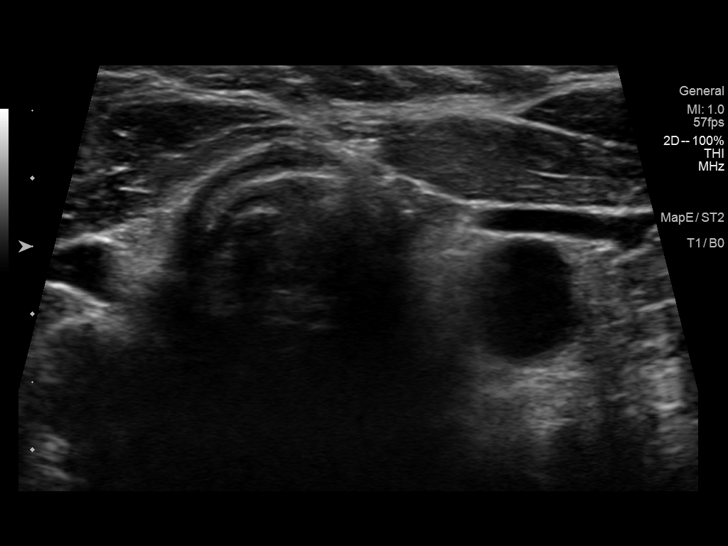
[im 13/37]
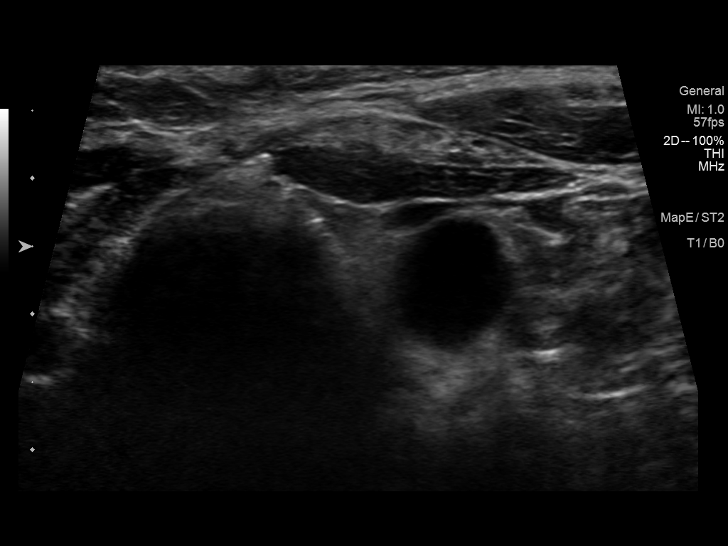
[im 14/37]
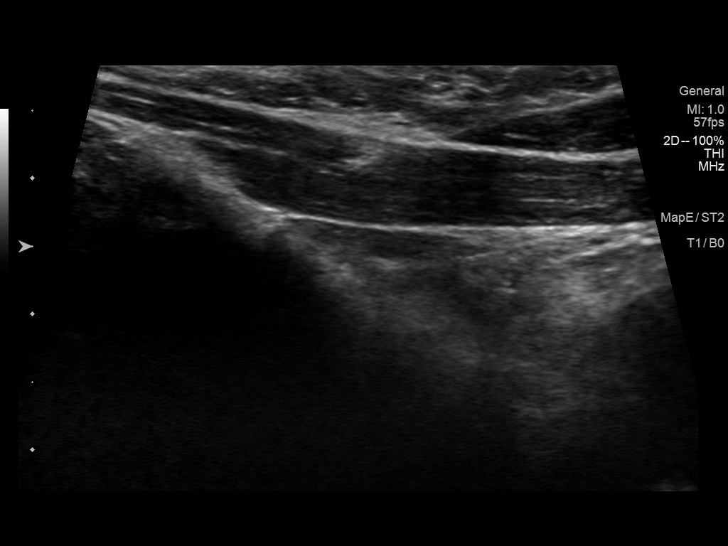
[im 17/37]
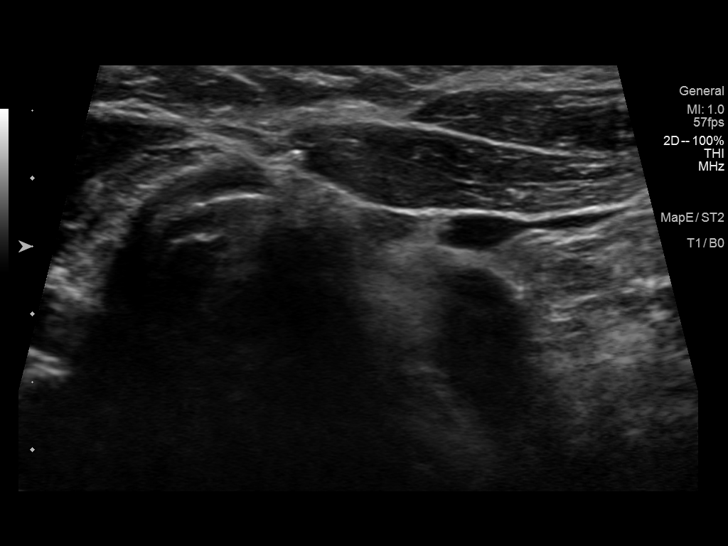
[im 20/37]
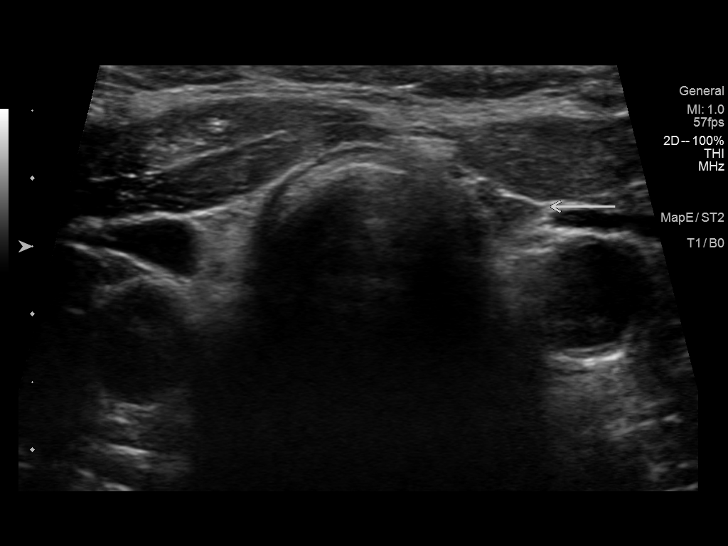
[im 23/37]
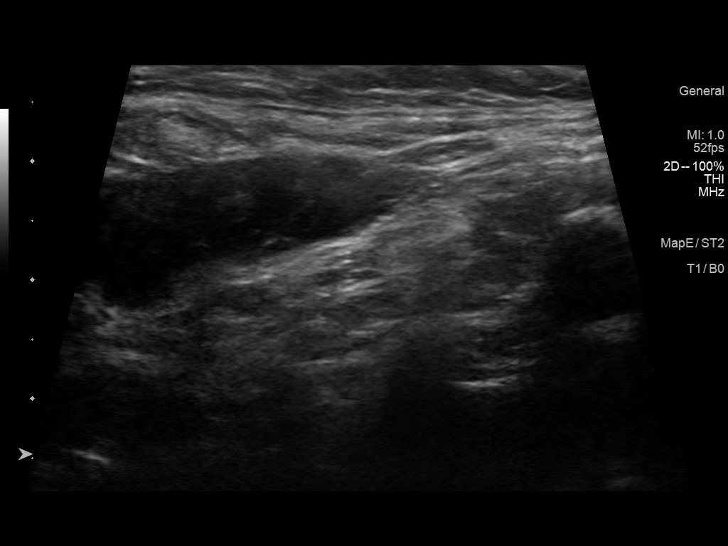
[im 25/37]
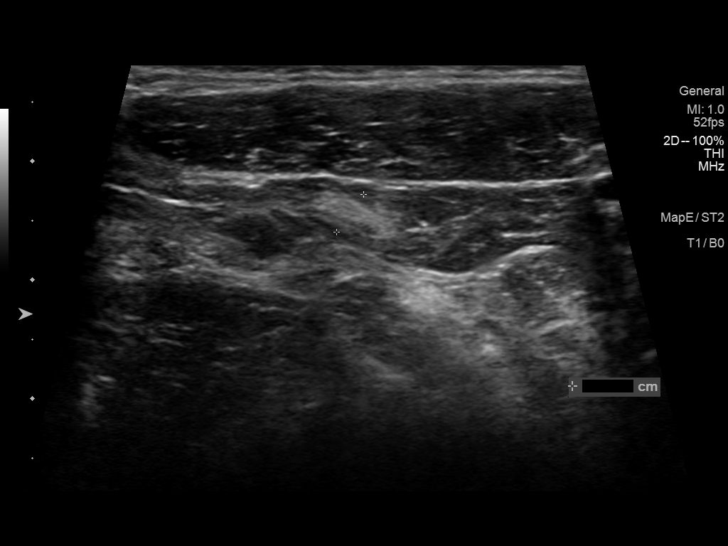
[im 28/37]
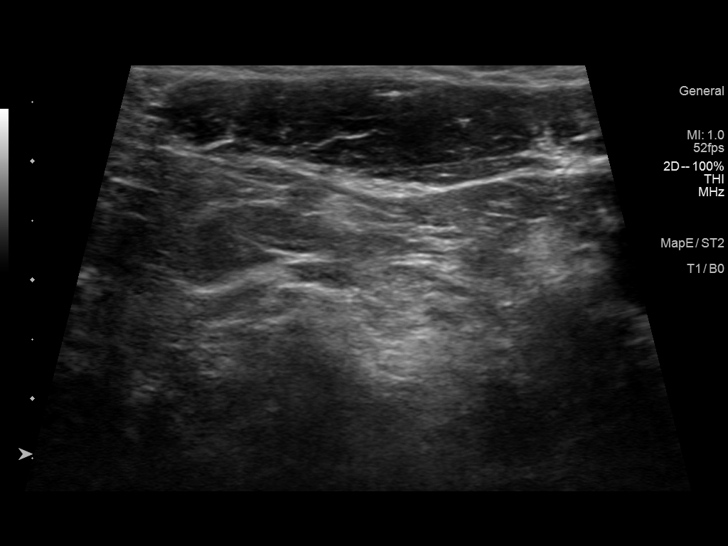
[im 31/37]
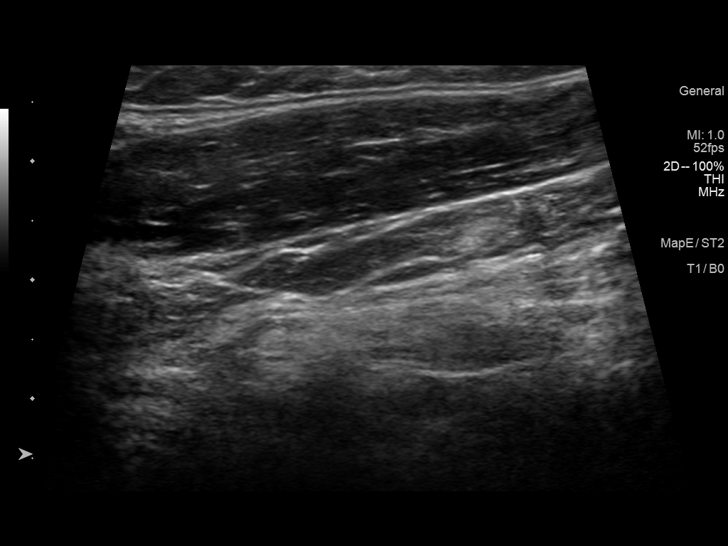
[im 34/37]
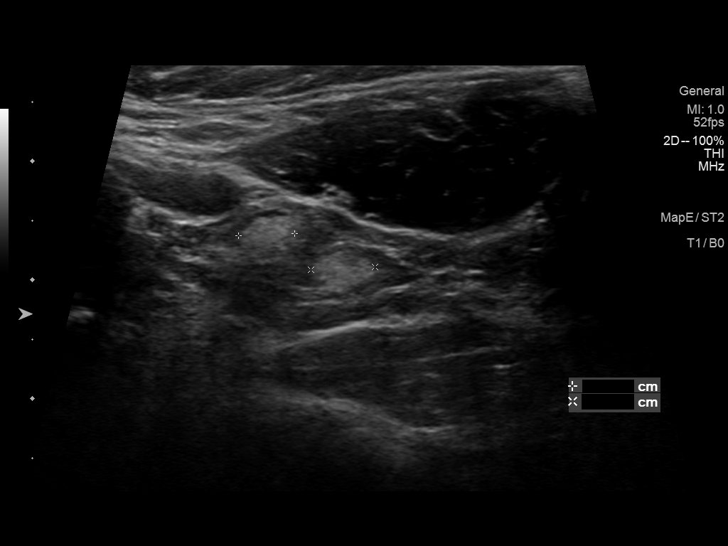
[im 37/37]
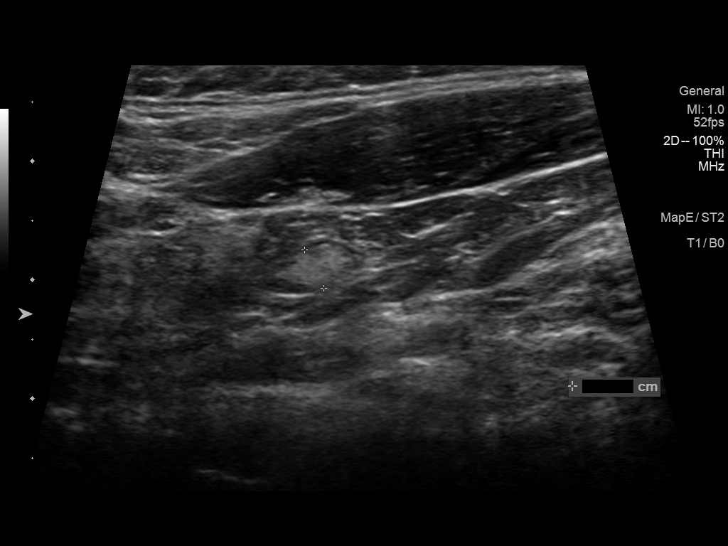

[14 of 25 positions shown; findings below may reference images not displayed]

FINDINGS: The thyroid gland is surgically absent. No evidence of residual
thyroid tissue within the right resection bed. However, on the left
there is an elongated triangular soft tissue structure measuring
x 0.2 x 1.2 cm. No suspicious nodularity. Incidental note is made of
fatty replaced lymph nodes in the bilateral cervical nodal chains.
These are not enlarged or suspicious by imaging criteria.
IMPRESSION: 1. Surgical changes of prior total thyroidectomy.
2. Small amount of residual thyroid tissue in the left resection bed
measuring 1.2 x 0.4 x 0.2 cm.
3. No evidence of residual nodularity or suspicious adenopathy.

## 2022-04-08 DIAGNOSIS — Z940 Kidney transplant status: Secondary | ICD-10-CM | POA: Diagnosis not present

## 2022-04-10 ENCOUNTER — Other Ambulatory Visit: Payer: Self-pay

## 2022-04-10 DIAGNOSIS — E1165 Type 2 diabetes mellitus with hyperglycemia: Secondary | ICD-10-CM

## 2022-04-10 DIAGNOSIS — B0229 Other postherpetic nervous system involvement: Secondary | ICD-10-CM | POA: Diagnosis not present

## 2022-04-10 DIAGNOSIS — F32A Depression, unspecified: Secondary | ICD-10-CM | POA: Diagnosis not present

## 2022-04-10 DIAGNOSIS — E1122 Type 2 diabetes mellitus with diabetic chronic kidney disease: Secondary | ICD-10-CM

## 2022-04-10 DIAGNOSIS — G894 Chronic pain syndrome: Secondary | ICD-10-CM | POA: Diagnosis not present

## 2022-04-10 DIAGNOSIS — M150 Primary generalized (osteo)arthritis: Secondary | ICD-10-CM | POA: Diagnosis not present

## 2022-04-10 MED ORDER — PEN NEEDLES 33G X 4 MM MISC: 3 refills | Status: AC

## 2022-04-25 DIAGNOSIS — B351 Tinea unguium: Secondary | ICD-10-CM | POA: Diagnosis not present

## 2022-04-25 DIAGNOSIS — E1142 Type 2 diabetes mellitus with diabetic polyneuropathy: Secondary | ICD-10-CM | POA: Diagnosis not present

## 2022-04-25 DIAGNOSIS — Z794 Long term (current) use of insulin: Secondary | ICD-10-CM | POA: Diagnosis not present

## 2022-05-13 ENCOUNTER — Encounter: Payer: Self-pay | Admitting: Endocrinology

## 2022-05-13 ENCOUNTER — Ambulatory Visit: Payer: Medicare Other | Admitting: Endocrinology

## 2022-05-13 VITALS — BP 120/80 | HR 61 | Ht 62.0 in | Wt 181.0 lb

## 2022-05-13 DIAGNOSIS — Z794 Long term (current) use of insulin: Secondary | ICD-10-CM

## 2022-05-13 DIAGNOSIS — E890 Postprocedural hypothyroidism: Secondary | ICD-10-CM | POA: Diagnosis not present

## 2022-05-13 DIAGNOSIS — E1165 Type 2 diabetes mellitus with hyperglycemia: Secondary | ICD-10-CM

## 2022-05-13 LAB — POCT GLYCOSYLATED HEMOGLOBIN (HGB A1C): Hemoglobin A1C: 6.5 % — AB (ref 4.0–5.6)

## 2022-05-13 LAB — BASIC METABOLIC PANEL
BUN: 39 mg/dL — ABNORMAL HIGH (ref 6–23)
CO2: 28 mEq/L (ref 19–32)
Calcium: 8.8 mg/dL (ref 8.4–10.5)
Chloride: 101 mEq/L (ref 96–112)
Creatinine, Ser: 2.32 mg/dL — ABNORMAL HIGH (ref 0.40–1.20)
GFR: 20.33 mL/min — ABNORMAL LOW (ref >60.00)
Glucose, Bld: 90 mg/dL (ref 70–99)
Potassium: 3.7 mEq/L (ref 3.5–5.1)
Sodium: 143 mEq/L (ref 135–145)

## 2022-05-13 LAB — TSH: TSH: 0.36 u[IU]/mL (ref 0.35–5.50)

## 2022-05-13 LAB — T4, FREE: Free T4: 1.42 ng/dL (ref 0.60–1.60)

## 2022-05-13 NOTE — Progress Notes (Unsigned)
Patient ID: Sandy Roy, female   DOB: [birth year], 72 y.o.   MRN: [id number]            Reason for Appointment: Type II Diabetes follow-up   History of Present Illness    Diagnosis date: 1989  Previous history:  Non-insulin hypoglycemic drugs previously used: Unknown Insulin was started in 2015 Previously was on NPH insulin  A1c range in the last few years is: 6.7-12.5 Highest A1c was in 3/23  Recent history:     Non-insulin hypoglycemic drugs: Farxiga 10 mg daily     Insulin regimen:  Lantus 20 units daily, Humalog usually 10 units before meals       Side effects from medications: None  Current self management, blood sugar patterns and problems identified:  A1c is 6.5 and improved  She takes Humalog insulin instead of the regular insulin since her last visit but did not follow-up as scheduled However blood sugars on her sensor appear to be excellent and 88% within target range However not consistently monitoring blood sugars especially late in the evenings Generally will eat a sandwich at lunchtime, may be eat out in the evening She was recommended seeing the diabetes educator but she did not make an appointment Currently not exercising   Hypoglycemia: Has symptoms of sweating, all weakness, feeling off balance  Glucometer: One Touch.           Review of freestyle libre 3 sensor download shows the following analysis for the last 2 weeks  Blood sugars are generally fairly stable with GV 27 Average blood sugar ranges between a low of 120 early morning to a high of 147 midday She does have some variable mealtimes and will sometimes have significantly high readings after breakfast or dinner Also occasionally appears to be getting excessive insulin for breakfast or lunch Yesterday with minimal carbohydrate intake she took her usual 10 units since the blood sugar was high and got hypoglycemic At times may be late in taking her mealtime insulin Also  appears to be overtreating low sugars  CGM use % of time 84  2-week average/GV 135  Time in range        88%  % Time Above 180 10  % Time above 250   % Time Below 70 2     PRE-MEAL Fasting Lunch Dinner Bedtime Overall  Glucose range:       Averages: 120       POST-MEAL PC Breakfast PC Lunch PC Dinner  Glucose range:     Averages: 144 145 144   Proir  CGM use % of time 82  2-week average/GV 139/32  Time in range    78    %  % Time Above 180 18+1  % Time above 250   % Time Below 70 3     PRE-MEAL Fasting Lunch Dinner Bedtime Overall  Glucose range:       Averages: 119 116   139   POST-MEAL PC Breakfast PC Lunch PC Dinner  Glucose range:     Averages: 129 180 164   CDE visit: Most recent: 9/23  Weight control:  Wt Readings from Last 3 Encounters:  05/13/22 181 lb (82.1 kg)  01/16/22 176 lb 3.2 oz (79.9 kg)  11/19/21 179 lb 6.4 oz (81.4 kg)            Diabetes labs:  Lab Results  Component Value Date   HGBA1C 6.5 (A) 05/13/2022   HGBA1C 7.0 10/08/2021  HGBA1C 9.2 (A) 07/08/2021   Lab Results  Component Value Date   MICROALBUR 83.4 (H) 04/07/2013   LDLCALC 99 10/08/2021   CREATININE 2.32 (H) 05/13/2022    Lab Results  Component Value Date   FRUCTOSAMINE 238 11/01/2021   FRUCTOSAMINE 264 12/14/2018     Allergies as of 05/13/2022       Reactions   Lisinopril Swelling   Trazodone Hcl    Other reaction(s): depression        Medication List        Accurate as of May 13, 2022 11:59 PM. If you have any questions, ask your nurse or doctor.          atorvastatin 20 MG tablet Commonly known as: LIPITOR Take 1 tablet (20 mg total) by mouth daily.   BD Insulin Syringe U/F 31G X 5/16" 0.5 ML Misc Generic drug: Insulin Syringe-Needle U-100 Inject 1 each into the skin daily at 6pm.   Insulin Syringe-Needle U-100 31G X 5/16" 0.3 ML Misc Commonly known as: BD Insulin Syringe U/F Use with insulin 4 times daily   calcitRIOL 0.5 MCG  capsule Commonly known as: ROCALTROL Take 0.5 mcg by mouth as directed. M/w/f   ciclopirox 8 % solution Commonly known as: PENLAC Apply 1 Application topically at bedtime.   cloNIDine 0.2 MG tablet Commonly known as: CATAPRES Take 0.1 mg by mouth daily.   Eliquis 5 MG Tabs tablet Generic drug: apixaban Take 1 tablet (5 mg total) by mouth 2 (two) times daily.   Farxiga 10 MG Tabs tablet Generic drug: dapagliflozin propanediol Take 10 mg by mouth daily.   fish oil-omega-3 fatty acids 1000 MG capsule Take 1 capsule by mouth daily.   FreeStyle Libre 2 Sensor Misc 1 Device by Does not apply route every 14 (fourteen) days.   furosemide 40 MG tablet Commonly known as: LASIX Take 40 mg by mouth 2 (two) times daily.   hydrALAZINE 50 MG tablet Commonly known as: APRESOLINE Take 25 mg by mouth 3 (three) times daily.   insulin glargine 100 UNIT/ML injection Commonly known as: Lantus Inject 18 units into skin every morning.   insulin lispro 100 UNIT/ML injection Commonly known as: HumaLOG Take 10-14 units before each meal   labetalol 100 MG tablet Commonly known as: NORMODYNE 1 in the morning 1 in the evening   levothyroxine 112 MCG tablet Commonly known as: SYNTHROID Take 1 whole tablet 6 days a week and 0.5 tablet 1 day a week.   lidocaine 5 % Commonly known as: LIDODERM Place 3 patches onto the skin daily. Remove & Discard patch within 12 hours or as directed by MD   multivitamin with minerals tablet Take 1 tablet by mouth daily.   mycophenolate 180 MG EC tablet Commonly known as: MYFORTIC Take 180 mg by mouth 2 (two) times daily.   omeprazole 20 MG capsule Commonly known as: PRILOSEC Take 20 mg by mouth daily.   onetouch ultrasoft lancets Use to check blood sugar 2 times per day   OneTouch Verio test strip Generic drug: glucose blood 1 each by Other route 2 (two) times daily. And lancets 2/day Dx code: E11.9   Pen Needles 33G X 4 MM Misc Use to inject  insulin 1 pen needle/day   predniSONE 5 MG tablet Commonly known as: DELTASONE Take 5 mg by mouth daily with breakfast.   sulfamethoxazole-trimethoprim 400-80 MG tablet Commonly known as: BACTRIM 1 tablet. On Monday Wednesday and Friday   tacrolimus 1 MG capsule Commonly  known as: PROGRAF Take 4 mg by mouth. 6 tabs in the morning 6 tabs in the evening   triazolam 0.25 MG tablet Commonly known as: HALCION 2 tablets by mouth at bedtime        Allergies:  Allergies  Allergen Reactions   Lisinopril Swelling   Trazodone Hcl     Other reaction(s): depression    Past Medical History:  Diagnosis Date   Chronic kidney disease    ESRD-  [place]- M_W_F   CONGESTIVE HEART FAILURE 12/14/2006   CORONARY ARTERY DISEASE 12/14/2006   Depression    not recently   DIABETES MELLITUS, TYPE II 12/14/2006   Type 2   HYPERLIPIDEMIA 12/14/2006   HYPERTENSION 12/14/2006   Hypoparathyroidism ([place]) 12/21/2006   Hypopotassemia 01/13/2007   HYPOTHYROIDISM, POSTSURGICAL 12/15/2006   NEOPLASM, MALIGNANT, THYROID GLAND 12/15/2006   Stage 1 papillary adenocarcinoma, one very small focus each lobe (67m, 226m  6/09: CT neck: no evidence of recurrence   OBSTRUCTIVE SLEEP APNEA 02/23/2007   does not use a cpap   Orbital fracture (HCC)    left eye   Shortness of breath dyspnea    with exertion    Past Surgical History:  [place]cedure [place]ality Date   [place]   Procedure: FIRST STAGE BASILIC VEIN TRANSPOSITION;  Surgeon: BrConrad BurlingtonMD;  [facility];  Service: Vascular;  [place]ality: Right;   [place] 05/[place]/2014   Procedure: SECOND STAGE BASILIC VEIN TRANSPOSITION;  Surgeon: BrConrad BurlingtonMD;  Location: [facility] OR;  Service: Vascular;  Laterality: Right;   BREAST SURGERY Left    nipple leaking, had surgery to fix the leak   CARDIAC CATHETERIZATION     ? 10 years ago   CHOLECYSTECTOMY     [place]OS[place]     [id number]   TONSILLECTOMY     TUBAL LIGATION      Family History  Problem Relation Age of Onset   Heart failure Mother    Diabetes Mother    Cancer Father    Heart attack Brother    COPD Brother    Drug abuse Brother    Cancer Other    COPD Other    Hypertension Other    Hyperlipidemia Other    Stroke Other    Heart disease Other    Diabetes Other     Social History:  reports that she quit smoking about 24 years ago. Her smoking use included cigarettes. She has a 2.50 pack-year smoking history. She has never used smokeless tobacco. She reports that she does not drink alcohol and does not use drugs.  Review of Systems:  Last diabetic eye exam date?  Last urine microalbumin date:?  Last foot exam date: 10/22  Symptoms of neuropathy: tingling  Hypertension: Treated with hydralazine and Catapres ACE/ARB medication: None  BP Readings from Last 3 Encounters:  05/13/22 120/80  01/16/22 109/70  11/04/21 (!) 146/64   Followed by cardiologist for paroxysmal atrial fibrillation  Lipid management: Treated with 20 mg atorvastatin, last LDL 115 in 10/23    Lab Results  Component Value Date   CHOL 400 (H) 01/17/2015   CHOL 141 03/28/2014   CHOL 330 03/18/2010   Lab Results  Component Value Date   HDL 64.90 01/17/2015   HDL 47.30 03/28/2014   HDL 47 03/18/2010   Lab Results  Component Value Date   LDLCALC 99  10/08/2021   LDLCALC 59 03/28/2014   Lab Results  Component Value Date   TRIG 246.0 (H) 01/17/2015   TRIG 172.0 (H) 03/28/2014   TRIG 438 03/18/2010   Lab Results  Component Value Date   CHOLHDL 6 01/17/2015   CHOLHDL 3 03/28/2014   Lab Results  Component Value Date   LDLDIRECT 273.0 01/17/2015   LDLDIRECT 164 03/18/2010    HYPOTHYROIDISM post thyroidectomy in 2008:  Her thyroid surgery showed 2 small foci of papillary cancer and follow-up ultrasound in 2021 showed only small residual thyroid tissue  She has been on 112  mcg levothyroxine for the last few years TSH is normal with taking 6-1/2 tablets a week    Lab Results  Component Value Date   TSH 0.36 05/13/2022   TSH 0.60 11/04/2021   TSH 4.20 04/26/2019   FREET4 1.42 05/13/2022   FREET4 0.83 12/14/2018   FREET4 1.09 01/13/2017      Examination:   BP 120/80 (BP Location: Left Arm, Patient Position: Sitting, Cuff Size: Large)   Pulse 61   Ht '5\' 2"'$  (1.575 m)   Wt 181 lb (82.1 kg)   SpO2 96%   BMI 33.11 kg/m   Body mass index is 33.11 kg/m.      ASSESSMENT/ PLAN:    Diabetes type 2 insulin requiring and longstanding Recent BMI about 33  Current regimen: Lantus and HUMALOG insulin before meals and Farxiga  See history of present illness for detailed discussion of current diabetes management, blood sugar patterns and problems identified  A1c is now 6.5 compared to 7%  She has better control with using Humalog instead of regular insulin although still not able to adjust her insulin dose for meals based on what she is eating causing some hyperglycemia and hypoglycemia also Also likely getting excessive insulin sometimes for correcting high readings Day-to-day management was discussed in detail  Recommendations:  She will try to be more regular with taking her Humalog BEFORE starting to eat consistently She does need to follow-up with diabetes educator to help her understand how to adjust insulin doses for various meals and continue to work on meal planning In the meantime she will continue the same insulin doses except add 2 more units when eating increased carbohydrates If her sugar is unexpectedly high she should take only 2 to 4 units Humalog per correction instead of 10  HYPOTHYROIDISM post thyroidectomy: She previously had normal TSH with current regimen of levothyroxine  Needs follow-up  Patient Instructions  Take 20 lantus in am daily  For high sugars take only 2-4 Humalog  Humalog before meals  Elayne Snare 05/14/2022,  3:56 PM   Addendum: Creatinine is higher, she will follow-up with PCP or nephrologist, also take levothyroxine 6 days a week instead of 6-1/2

## 2022-05-13 NOTE — Patient Instructions (Addendum)
Take 20 lantus in am daily  For high sugars take only 2-4 Humalog  Humalog before meals

## 2022-05-16 ENCOUNTER — Ambulatory Visit: Payer: Medicare Other | Admitting: Endocrinology

## 2022-05-28 ENCOUNTER — Encounter: Payer: Self-pay | Admitting: Endocrinology

## 2022-06-04 DIAGNOSIS — Z940 Kidney transplant status: Secondary | ICD-10-CM | POA: Diagnosis not present

## 2022-06-10 DIAGNOSIS — H2513 Age-related nuclear cataract, bilateral: Secondary | ICD-10-CM | POA: Diagnosis not present

## 2022-06-10 DIAGNOSIS — H25013 Cortical age-related cataract, bilateral: Secondary | ICD-10-CM | POA: Diagnosis not present

## 2022-06-10 DIAGNOSIS — H18413 Arcus senilis, bilateral: Secondary | ICD-10-CM | POA: Diagnosis not present

## 2022-06-10 DIAGNOSIS — H25043 Posterior subcapsular polar age-related cataract, bilateral: Secondary | ICD-10-CM | POA: Diagnosis not present

## 2022-06-10 DIAGNOSIS — H2512 Age-related nuclear cataract, left eye: Secondary | ICD-10-CM | POA: Diagnosis not present

## 2022-06-11 DIAGNOSIS — I4891 Unspecified atrial fibrillation: Secondary | ICD-10-CM | POA: Diagnosis not present

## 2022-06-11 DIAGNOSIS — Z940 Kidney transplant status: Secondary | ICD-10-CM | POA: Diagnosis not present

## 2022-06-11 DIAGNOSIS — I10 Essential (primary) hypertension: Secondary | ICD-10-CM | POA: Diagnosis not present

## 2022-06-11 DIAGNOSIS — E119 Type 2 diabetes mellitus without complications: Secondary | ICD-10-CM | POA: Diagnosis not present

## 2022-07-17 ENCOUNTER — Ambulatory Visit: Payer: Medicare Other | Admitting: Cardiology

## 2022-07-17 ENCOUNTER — Encounter: Payer: Self-pay | Admitting: Cardiology

## 2022-07-17 VITALS — BP 144/72 | HR 68 | Resp 16 | Ht 62.0 in | Wt 189.0 lb

## 2022-07-17 DIAGNOSIS — I2510 Atherosclerotic heart disease of native coronary artery without angina pectoris: Secondary | ICD-10-CM

## 2022-07-17 DIAGNOSIS — R9439 Abnormal result of other cardiovascular function study: Secondary | ICD-10-CM | POA: Diagnosis not present

## 2022-07-17 DIAGNOSIS — I480 Paroxysmal atrial fibrillation: Secondary | ICD-10-CM | POA: Diagnosis not present

## 2022-07-17 MED ORDER — APIXABAN 5 MG PO TABS: 5.0000 mg | ORAL_TABLET | Freq: Two times a day (BID) | ORAL | 5 refills | Status: DC

## 2022-07-17 NOTE — Progress Notes (Signed)
Patient referred by [name], MD for atrial flutter  Subjective:   Sandy Roy, female    DOB: 12/10/1950, 72 y.o.   MRN: [id number]   Chief Complaint  Patient presents with   Atrial Fibrillation   Follow-up    6 month     HPI  72 y.o. African American female with CAD, CKD s/p kidney transplant, hypertension, hyperlipidemia, type 2 DM, persistent Afib/flutter, abnormal stress test  Patient is doing well, denies any complaints of chest pain, shortness of breath.  Blood pressure is elevated more than usual today, generally well-controlled.  She has regular follow-up with nephrology.  She denies any bleeding issues while on Eliquis.   Current Outpatient Medications:    atorvastatin (LIPITOR) 20 MG tablet, Take 1 tablet (20 mg total) by mouth daily., Disp: 90 tablet, Rfl: 3   BD INSULIN SYRINGE U/F 31G X 5/16" 0.5 ML MISC, Inject 1 each into the skin daily at 6pm., Disp: 100 each, Rfl: 0   calcitRIOL (ROCALTROL) 0.5 MCG capsule, Take 0.5 mcg by mouth as directed. M/w/f, Disp: , Rfl:    ciclopirox (PENLAC) 8 % solution, Apply 1 Application topically at bedtime., Disp: , Rfl:    cloNIDine (CATAPRES) 0.2 MG tablet, Take 0.1 mg by mouth daily., Disp: , Rfl:    Continuous Blood Gluc Sensor (FREESTYLE LIBRE 2 SENSOR) MISC, 1 Device by Does not apply route every 14 (fourteen) days., Disp: 6 each, Rfl: 3   ELIQUIS 5 MG TABS tablet, Take 1 tablet (5 mg total) by mouth 2 (two) times daily., Disp: 60 tablet, Rfl: 5   FARXIGA 10 MG TABS tablet, Take 10 mg by mouth daily., Disp: , Rfl:    fish oil-omega-3 fatty acids 1000 MG capsule, Take 1 capsule by mouth daily., Disp: , Rfl:    furosemide (LASIX) 40 MG tablet, Take 40 mg by mouth 2 (two) times daily., Disp: , Rfl:    glucose blood (ONETOUCH VERIO) test strip, 1 each by Other route 2 (two) times daily. And lancets 2/day Dx code: E11.9, Disp: 200 each, Rfl: 3   hydrALAZINE (APRESOLINE) 50 MG tablet, Take 25 mg by mouth 3 (three) times  daily., Disp: , Rfl:    insulin glargine (LANTUS) 100 UNIT/ML injection, Inject 18 units into skin every morning., Disp: 20 mL, Rfl: 1   insulin lispro (HUMALOG) 100 UNIT/ML injection, Take 10-14 units before each meal, Disp: 40 mL, Rfl: 2   Insulin Pen Needle (PEN NEEDLES) 33G X 4 MM MISC, Use to inject insulin 1 pen needle/day, Disp: 100 each, Rfl: 3   Insulin Syringe-Needle U-100 (BD INSULIN SYRINGE U/F) 31G X 5/16" 0.3 ML MISC, Use with insulin 4 times daily, Disp: 400 each, Rfl: 3   labetalol (NORMODYNE) 100 MG tablet, 1 in the morning 1 in the evening, Disp: , Rfl: 2   Lancets (ONETOUCH ULTRASOFT) lancets, Use to check blood sugar 2 times per day, Disp: 100 each, Rfl: 2   levothyroxine (SYNTHROID) 112 MCG tablet, Take 1 whole tablet 6 days a week and 0.5 tablet 1 day a week., Disp: 90 tablet, Rfl: 3   lidocaine (LIDODERM) 5 %, Place 3 patches onto the skin daily. Remove & Discard patch within 12 hours or as directed by MD, Disp: , Rfl:    Multiple Vitamins-Minerals (MULTIVITAMIN WITH MINERALS) tablet, Take 1 tablet by mouth daily., Disp: , Rfl:    mycophenolate (MYFORTIC) 180 MG EC tablet, Take 180 mg by mouth 2 (two) times daily., Disp: ,  Rfl:    omeprazole (PRILOSEC) 20 MG capsule, Take 20 mg by mouth daily., Disp: , Rfl:    predniSONE (DELTASONE) 5 MG tablet, Take 5 mg by mouth daily with breakfast., Disp: , Rfl:    sulfamethoxazole-trimethoprim (BACTRIM,SEPTRA) 400-80 MG per tablet, 1 tablet. On Monday Wednesday and Friday, Disp: , Rfl:    tacrolimus (PROGRAF) 1 MG capsule, Take 4 mg by mouth. 6 tabs in the morning 6 tabs in the evening, Disp: , Rfl:    triazolam (HALCION) 0.25 MG tablet, 2 tablets by mouth at bedtime, Disp: , Rfl:    Cardiovascular and other pertinent studies:  EKG 07/17/2022: Sinus rhythm 66 bpm LVH ST-T abnormality likely due to LVH  Echocardiogram 08/07/2021:  Left ventricle cavity is normal in size. Moderate concentric hypertrophy  of the left ventricle. Normal  global wall motion. Normal LV systolic  function with visual EF 50-55%. Unable to evaluate diastolic function due  to atrial fibrillation.  Left atrial cavity is moderately dilated.  Structurally normal trileaflet aortic valve.  Mild (Grade I) aortic  regurgitation.  Mild (Grade I) mitral regurgitation.  Mild tricuspid regurgitation.  Small pericardial effusion. There is no hemodynamic significance.  No significant change compared to previous study on 10/02/2020.  Lexiscan Tetrofosmin stress test 08/07/2021: Abnormal nuclear stress test 1 Day Rest/Stress protocol.  Rest EKG illustrates atrial flutter & Stress EKG is non-diagnostic, as this is pharmacological stress test using Lexiscan and age-predicted maximal heart rate not achieved. Large size, moderate intensity, reversible perfusion defect involving the basal to distal inferior and mid inferolateral and apical lateral segments.  Findings suggestive of ischemia in the RCA /mid to distal LCx distribution. Mildly dilated left ventricle, wall thickness is preserved with mild hypokinesis of the inferior and inferolateral segments, calculated LVEF 66%. High risk stress test. No prior studies available for comparison.   Echocardiogram 10/02/2020:  Normal LV systolic function with visual EF 60-65%. Left ventricle cavity  is normal in size. Mild to moderate left ventricular hypertrophy. Normal  global wall motion. Unable to evaluate diastolic function due to atrial  flutter. Elevated LAP.  Left atrial cavity is moderately dilated.  Mild to moderate mitral regurgitation.  Mild tricuspid regurgitation.  Mild pulmonic regurgitation.  Small pericardial effusion. There is no hemodynamic significance.  Compared to study 12/26/2019 no significant change.   Mobile cardiac telemetry 14 days 02/06/2020 - 02/20/2020:  Dominant rhythm: Atrial flutter/fibrillation  HR 48-144 bpm. Avg HR 75 bpm.  Afib/flutter burden 64%  Afib/flutter is asymptomatic.   Multiple episodes of atrial tachycardia, fastest at 133 bpm for 4 beats, longest for 7 beats at 118 bpm.  1.7% isolated SVE, <1% couplet/triplets.  <1% isolated VE, couplet/triplets.  No VT/high grade AV block, sinus pause >3sec noted.  1 patient triggered event, correlated with supraventricular ectopy.   Coronary angiogram 2012: 1. Abnormal left ventricular function with anteroapical defect compatible     with a previous infarction.  2. Diffusely narrowed and small distal LAD but no significant focal     obstructive stenoses noted.  3. Occlusion of the right coronary artery with collaterals from the left     coronary system.  EKG 02/06/2020: Wandering atrial pacemaker at the rate of 64 bpm.  Normal axis.  Poor R wave progression, cannot exclude anteroseptal infarct old.  Nonspecific T abnormality.    EKG 01/06/2020: Sinus rhythm 67 bpm with rate variation  Right atrial enlargement Baseline artifact  Recent labs: 05/13/2022: Glucose 90, BUN/Cr 39/2.3. EGFR 20. Na/K 143/3.7.  HbA1C 6.5% TSH 0.36 normal  10/09/2020: Glucose 306, BUN/Cr 39/2.5. EGFR 20. Na/K 137/4.5. Rest of the CMP normal H/H 12/38. MCV 93. Platelets 304 HbA1C 11.8% Chol 220, TG 194, HDL 70, LDL 109 Mag 1.5  02/01/2020: Glucose 138, BUN/Cr 40/1.7. EGFR 28. HbA1C 8.6% Chol 230, TG 315, HDL 58, LDL N/A  12/08/2019: Glucose 194, BUN/Cr 20/1.41. EGFR 43. Na/K 142/4.4. Rest of the CMP normal H/H 10/32. MCV 91. Platelets 355 Iron 24 mcg/dl (40-981) Iron sat 8% (19-14%)  04/2019: TSH 4.2 normal    Review of Systems  Cardiovascular:  Negative for chest pain, dyspnea on exertion, leg swelling, palpitations and syncope.        Vitals:   07/17/22 1019  BP: (!) 144/72  Pulse: 68  Resp: 16  SpO2: 96%     Body mass index is 34.57 kg/m. Filed Weights   07/17/22 1019  Weight: 189 lb (85.7 kg)     Objective:   Physical Exam Vitals and nursing note reviewed.  Constitutional:      General: She is  not in acute distress. Neck:     Vascular: No JVD.  Cardiovascular:     Rate and Rhythm: Normal rate and regular rhythm.     Heart sounds: Normal heart sounds. No murmur heard. Pulmonary:     Effort: Pulmonary effort is normal.     Breath sounds: Normal breath sounds. No wheezing or rales.  Musculoskeletal:     Right lower leg: No edema.     Left lower leg: No edema.         Assessment & Recommendations:   72 y.o. African American female with CAD, CKD s/p kidney transplant, hypertension, hyperlipidemia, type 2 DM, persistent Afib/flutter, abnormal stress test   Abnormal stress test, CAD: Prior history. Findings of ischemia in RCA territory, correlate with her known RCA CTO. No symptoms of angina at this time.  Cr is high. Recommend continuing medical management.  Not on Aspirin due to ongoing use of eliquis.  Continue Lipitor 20 mg daily.   Paroxysmal Afib/ persistent atrial flutter: Rate controlled. Recommend risk factor modification, especially obesity, OSA. Unfortunately, she does not want to undergo sleep study and wear CPAP.  CHA2DS2VASc score 4, annual stroke risk 5%. Continue eliquis 5 mg bid   Continue management of uncontrolled DM, CKD with endocrinology, nephrology.   F/u in 1 year   Elder Negus, MD Pager: [phone] Office: [phone]

## 2022-07-22 IMAGING — US US EXTREM LOW VENOUS
1 series · 13 of 24 positions shown · non-contrast
Comparison: Right lower extremity venous Doppler
ultrasound-10/24/2015 (negative).

CLINICAL DATA: Bilateral lower extremity swelling. History of renal
transplant. Evaluate for DVT.



[Series 1: us extrem low venous · 0.07mm/px · 13 of 72 slices shown]
[im 1/72]
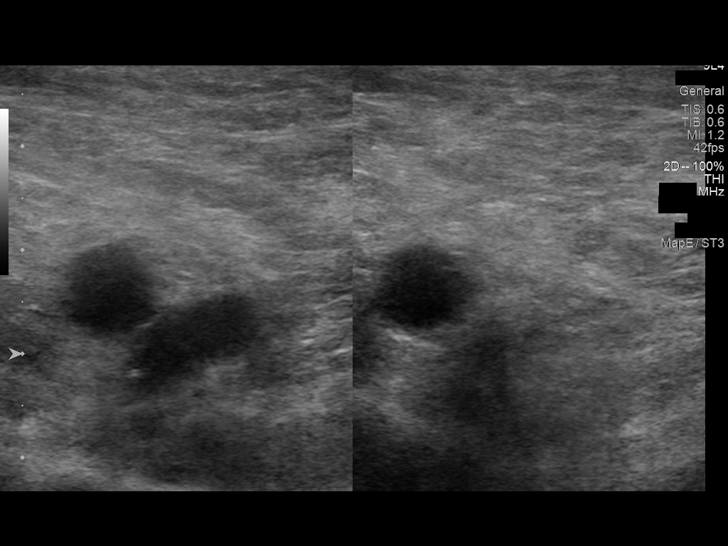
[im 7/72]
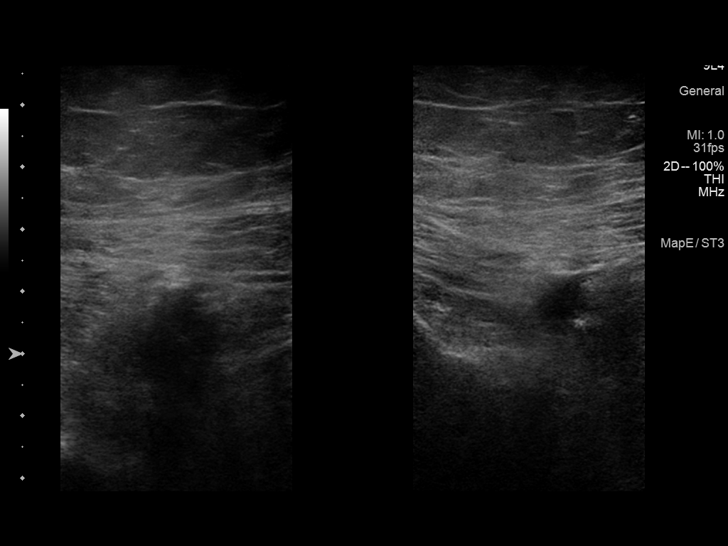
[im 13/72]
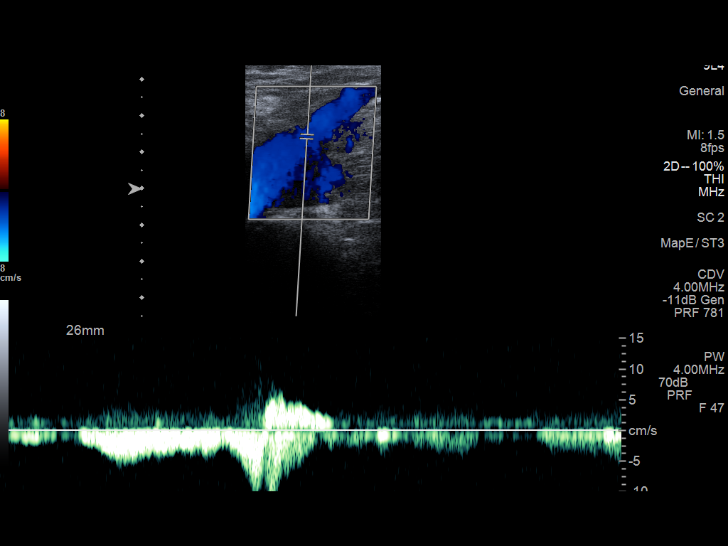
[im 19/72]
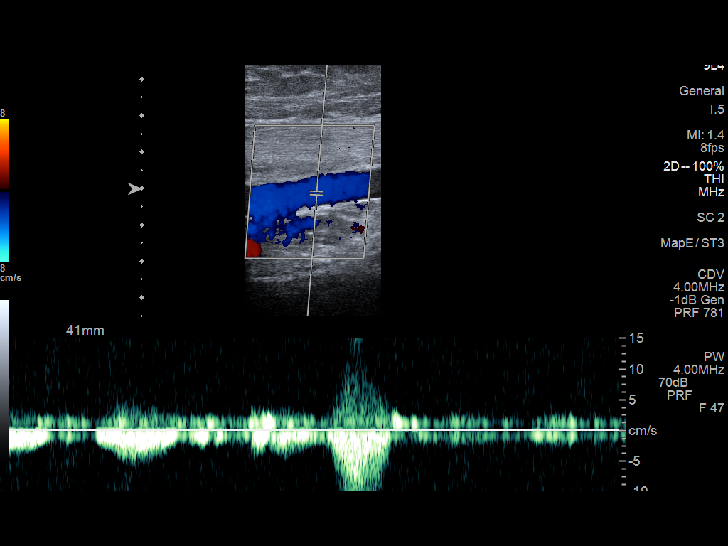
[im 25/72]
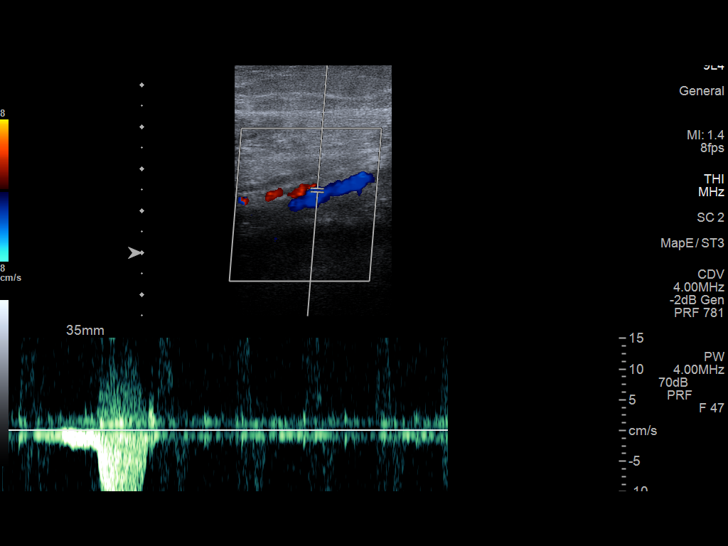
[im 31/72]
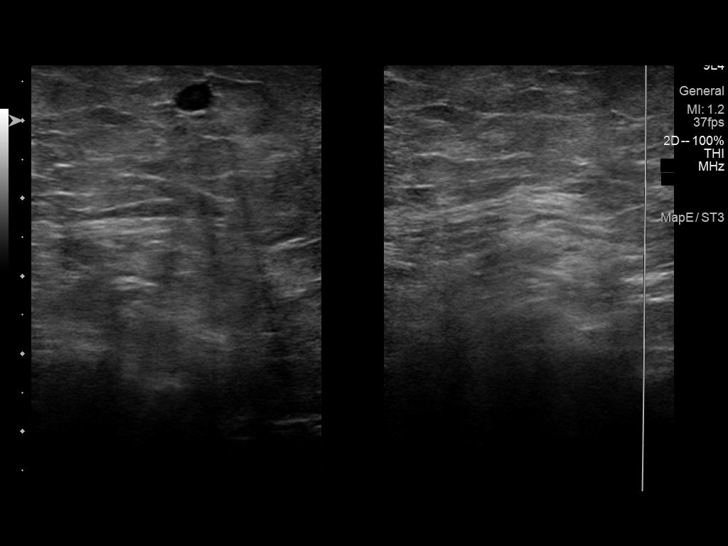
[im 38/72]
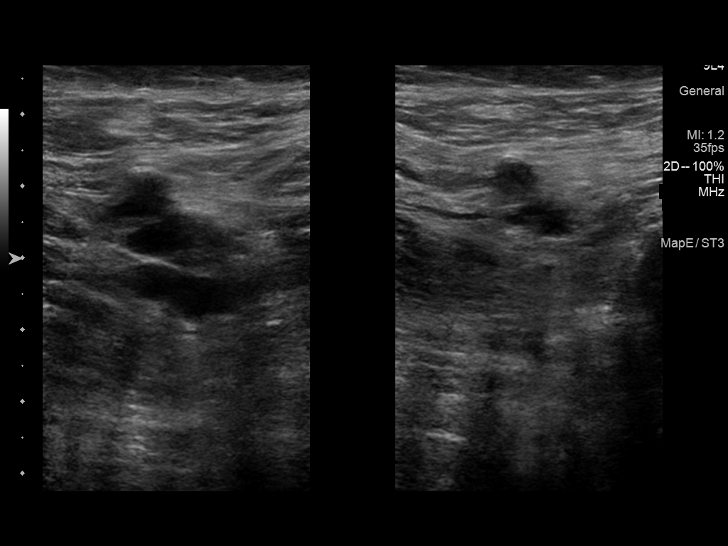
[im 41/72]
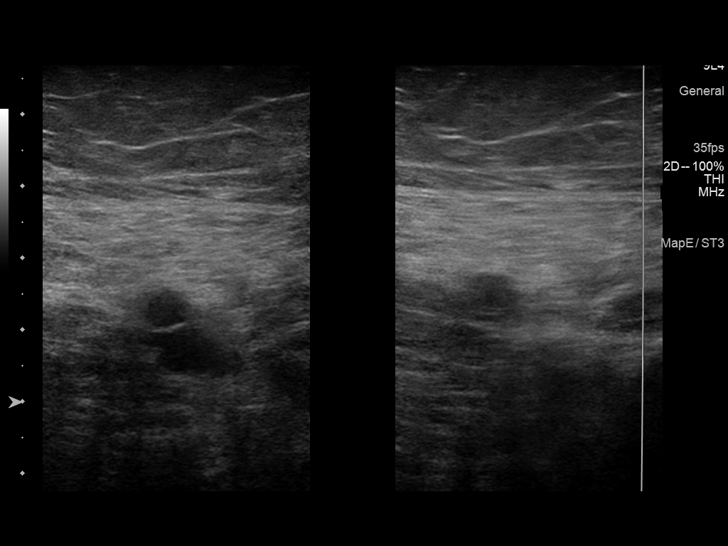
[im 47/72]
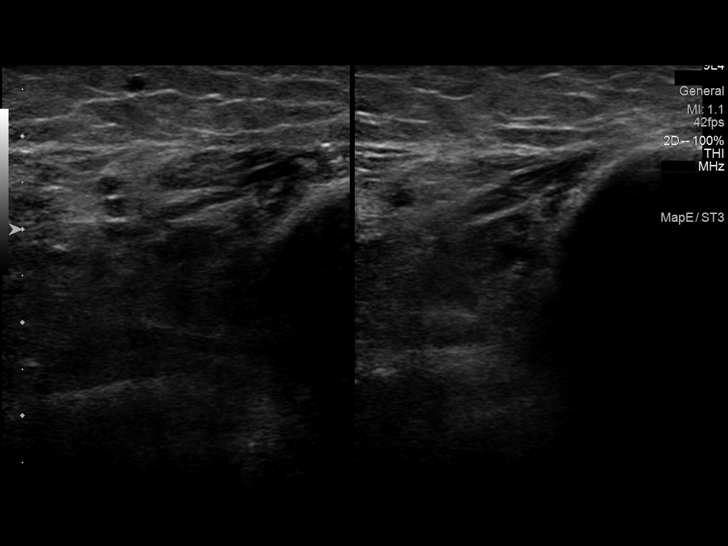
[im 53/72]
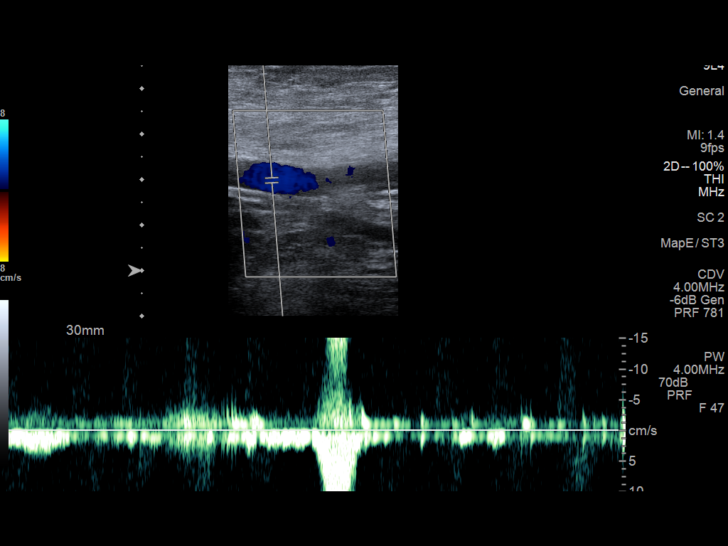
[im 59/72]
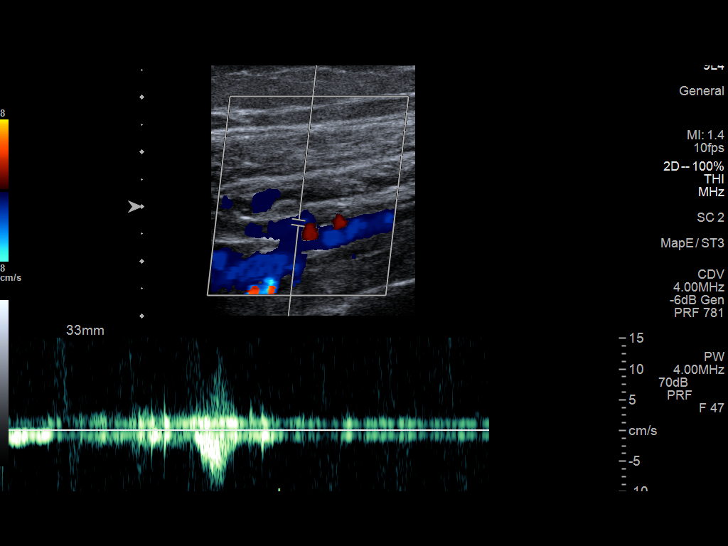
[im 65/72]
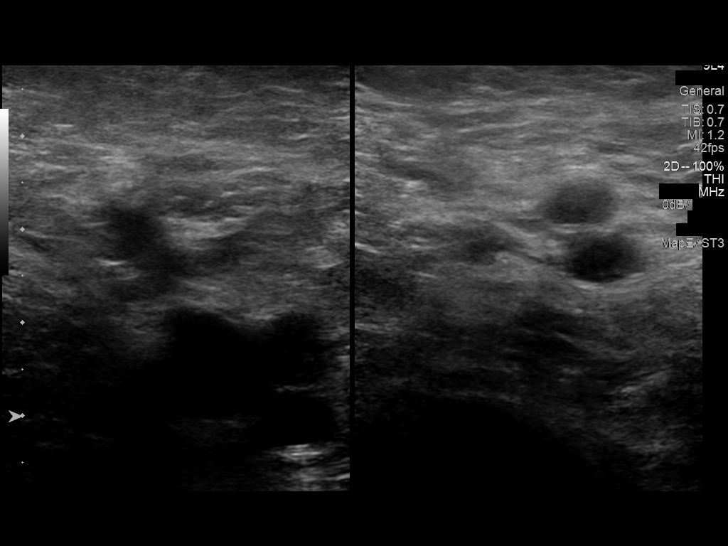
[im 72/72]
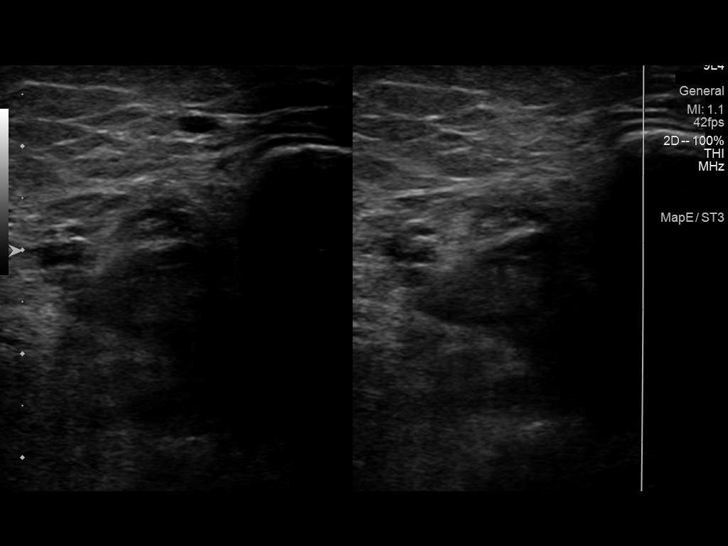

[13 of 24 positions shown; findings below may reference images not displayed]

FINDINGS: RIGHT LOWER EXTREMITY

Common Femoral Vein: No evidence of thrombus. Normal
compressibility, respiratory phasicity and response to augmentation.

Saphenofemoral Junction: No evidence of thrombus. Normal
compressibility and flow on color Doppler imaging.

Profunda Femoral Vein: No evidence of thrombus. Normal
compressibility and flow on color Doppler imaging.

Femoral Vein: No evidence of thrombus. Normal compressibility,
respiratory phasicity and response to augmentation.

Popliteal Vein: No evidence of thrombus. Normal compressibility,
respiratory phasicity and response to augmentation.

Calf Veins: Appear patent where visualized.

Superficial Great Saphenous Vein: No evidence of thrombus. Normal
compressibility.

Venous Reflux:  None.

Other Findings:  None.

LEFT LOWER EXTREMITY

Common Femoral Vein: No evidence of thrombus. Normal
compressibility, respiratory phasicity and response to augmentation.

Saphenofemoral Junction: No evidence of thrombus. Normal
compressibility and flow on color Doppler imaging.

Profunda Femoral Vein: No evidence of thrombus. Normal
compressibility and flow on color Doppler imaging.

Femoral Vein: No evidence of thrombus. Normal compressibility,
respiratory phasicity and response to augmentation.

Popliteal Vein: No evidence of thrombus. Normal compressibility,
respiratory phasicity and response to augmentation.

Calf Veins: No evidence of thrombus. Normal compressibility and flow
on color Doppler imaging.

Superficial Great Saphenous Vein: No evidence of thrombus. Normal
compressibility.

Venous Reflux:  None.

Other Findings:  None.
IMPRESSION: No evidence of DVT within either lower extremity.

## 2022-07-28 DIAGNOSIS — E119 Type 2 diabetes mellitus without complications: Secondary | ICD-10-CM | POA: Diagnosis not present

## 2022-07-28 DIAGNOSIS — B351 Tinea unguium: Secondary | ICD-10-CM | POA: Diagnosis not present

## 2022-07-29 DIAGNOSIS — Z0000 Encounter for general adult medical examination without abnormal findings: Secondary | ICD-10-CM | POA: Diagnosis not present

## 2022-07-29 DIAGNOSIS — I129 Hypertensive chronic kidney disease with stage 1 through stage 4 chronic kidney disease, or unspecified chronic kidney disease: Secondary | ICD-10-CM | POA: Diagnosis not present

## 2022-07-29 DIAGNOSIS — E1122 Type 2 diabetes mellitus with diabetic chronic kidney disease: Secondary | ICD-10-CM | POA: Diagnosis not present

## 2022-07-29 DIAGNOSIS — N184 Chronic kidney disease, stage 4 (severe): Secondary | ICD-10-CM | POA: Diagnosis not present

## 2022-07-29 DIAGNOSIS — I480 Paroxysmal atrial fibrillation: Secondary | ICD-10-CM | POA: Diagnosis not present

## 2022-07-29 DIAGNOSIS — Z23 Encounter for immunization: Secondary | ICD-10-CM | POA: Diagnosis not present

## 2022-07-29 DIAGNOSIS — Z79620 Long term (current) use of immunosuppressive biologic: Secondary | ICD-10-CM | POA: Diagnosis not present

## 2022-07-29 DIAGNOSIS — Z940 Kidney transplant status: Secondary | ICD-10-CM | POA: Diagnosis not present

## 2022-07-29 DIAGNOSIS — B0229 Other postherpetic nervous system involvement: Secondary | ICD-10-CM | POA: Diagnosis not present

## 2022-07-29 DIAGNOSIS — E782 Mixed hyperlipidemia: Secondary | ICD-10-CM | POA: Diagnosis not present

## 2022-08-25 DIAGNOSIS — H25812 Combined forms of age-related cataract, left eye: Secondary | ICD-10-CM | POA: Diagnosis not present

## 2022-08-25 DIAGNOSIS — H2512 Age-related nuclear cataract, left eye: Secondary | ICD-10-CM | POA: Diagnosis not present

## 2022-08-26 DIAGNOSIS — H2511 Age-related nuclear cataract, right eye: Secondary | ICD-10-CM | POA: Diagnosis not present

## 2022-09-09 ENCOUNTER — Encounter: Payer: Self-pay | Admitting: "Endocrinology

## 2022-09-09 ENCOUNTER — Ambulatory Visit: Payer: Medicare Other | Admitting: "Endocrinology

## 2022-09-09 VITALS — BP 140/70 | HR 62 | Ht 62.0 in | Wt 190.8 lb

## 2022-09-09 DIAGNOSIS — Z7984 Long term (current) use of oral hypoglycemic drugs: Secondary | ICD-10-CM | POA: Diagnosis not present

## 2022-09-09 DIAGNOSIS — Z794 Long term (current) use of insulin: Secondary | ICD-10-CM

## 2022-09-09 DIAGNOSIS — E782 Mixed hyperlipidemia: Secondary | ICD-10-CM

## 2022-09-09 DIAGNOSIS — E11649 Type 2 diabetes mellitus with hypoglycemia without coma: Secondary | ICD-10-CM | POA: Diagnosis not present

## 2022-09-09 LAB — LIPID PANEL
Cholesterol: 236 mg/dL — ABNORMAL HIGH (ref 0–200)
HDL: 59.8 mg/dL (ref >39.00)
LDL Cholesterol: 145 mg/dL — ABNORMAL HIGH (ref 0–99)
NonHDL: 176.52
Total CHOL/HDL Ratio: 4
Triglycerides: 160 mg/dL — ABNORMAL HIGH (ref 0.0–149.0)
VLDL: 32 mg/dL (ref 0.0–40.0)

## 2022-09-09 LAB — POCT GLYCOSYLATED HEMOGLOBIN (HGB A1C): Hemoglobin A1C: 6.2 % — AB (ref 4.0–5.6)

## 2022-09-09 LAB — COMPREHENSIVE METABOLIC PANEL
ALT: 24 U/L (ref 0–35)
AST: 26 U/L (ref 0–37)
Albumin: 3.9 g/dL (ref 3.5–5.2)
Alkaline Phosphatase: 85 U/L (ref 39–117)
BUN: 47 mg/dL — ABNORMAL HIGH (ref 6–23)
CO2: 28 mEq/L (ref 19–32)
Calcium: 7.7 mg/dL — ABNORMAL LOW (ref 8.4–10.5)
Chloride: 103 mEq/L (ref 96–112)
Creatinine, Ser: 2.2 mg/dL — ABNORMAL HIGH (ref 0.40–1.20)
GFR: 21.61 mL/min — ABNORMAL LOW (ref >60.00)
Glucose, Bld: 50 mg/dL — ABNORMAL LOW (ref 70–99)
Potassium: 3.6 mEq/L (ref 3.5–5.1)
Sodium: 145 mEq/L (ref 135–145)
Total Bilirubin: 0.6 mg/dL (ref 0.2–1.2)
Total Protein: 6.8 g/dL (ref 6.0–8.3)

## 2022-09-09 LAB — MICROALBUMIN / CREATININE URINE RATIO
Creatinine,U: 148.9 mg/dL
Microalb Creat Ratio: 29.2 mg/g (ref 0.0–30.0)
Microalb, Ur: 43.4 mg/dL — ABNORMAL HIGH (ref 0.0–1.9)

## 2022-09-09 NOTE — Progress Notes (Signed)
Outpatient Endocrinology Note [place], MD  09/09/22   Syretta Libertini Bisig [id number] [id number]  Referring Provider: [name], MD Primary Care Provider: [name], MD Reason for consultation: Subjective   Assessment & Plan  Diagnoses and all orders for this visit:  Uncontrolled type 2 diabetes mellitus with hypoglycemia without coma (HCC) -     POCT glycosylated hemoglobin (Hb A1C) -     Lipid panel; Future -     Microalbumin / creatinine urine ratio; Future -     Comprehensive metabolic panel; Future -     Comprehensive metabolic panel -     Lipid panel -     Microalbumin / creatinine urine ratio  Long term (current) use of oral hypoglycemic drugs  Long-term insulin use (HCC)  Mixed hypercholesterolemia and hypertriglyceridemia    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 (s/p kidney transplant, on prednisone 5 mg every day) Lab Results  Component Value Date   GFR 20.33 (L) 05/13/2022   Hba1c goal less than 7, current Hba1c is  Lab Results  Component Value Date   HGBA1C 6.2 (A) 09/09/2022   Will recommend the following: Farxiga 10 mg daily Lantus 18 units daily, Humalog 6 units for small meal, 10 units for medium meal, and 16 units for big meal   No known contraindications to any of above medications  -Last LD and Tg are as follows: Lab Results  Component Value Date   LDLCALC 99 10/08/2021    Lab Results  Component Value Date   TRIG 246.0 (H) 01/17/2015   -On atorvastatin 20 mg every day, ordered repeat lab  -Follow low fat diet and exercise   -Blood pressure goal <140/90 - Microalbumin/creatinine goal < 30, ordered repeat lab  -Last MA/Cr is as follows: Lab Results  Component Value Date   MICROALBUR 83.4 (H) 04/07/2013   -not on ACE/ARB  -diet changes including salt restriction -limit eating outside -counseled BP targets per standards of diabetes care -uncontrolled blood pressure can lead to retinopathy,  nephropathy and cardiovascular and atherosclerotic heart disease  Reviewed and counseled on: -A1C target -Blood sugar targets -Complications of uncontrolled diabetes  -Checking blood sugar before meals and bedtime and bring log next visit -All medications with mechanism of action and side effects -Hypoglycemia management: rule of 15's, Glucagon Emergency Kit and medical alert ID -low-carb low-fat plate-method diet -At least 20 minutes of physical activity per day -Annual dilated retinal eye exam and foot exam -compliance and follow up needs -follow up as scheduled or earlier if problem gets worse  Call if blood sugar is less than 70 or consistently above 250    Take a 15 gm snack of carbohydrate at bedtime before you go to sleep if your blood sugar is less than 100.    If you are going to fast after midnight for a test or procedure, ask your physician for instructions on how to reduce/decrease your insulin dose.    Call if blood sugar is less than 70 or consistently above 250  -Treating a low sugar by rule of 15  (15 gms of sugar every 15 min until sugar is more than 70) If you feel your sugar is low, test your sugar to be sure If your sugar is low (less than 70), then take 15 grams of a fast acting Carbohydrate (3-4 glucose tablets or glucose gel or 4 ounces of juice or regular soda) Recheck your sugar 15 min after treating low to make sure it  is more than 70 If sugar is still less than 70, treat again with 15 grams of carbohydrate          Don't drive the hour of hypoglycemia  If unconscious/unable to eat or drink by mouth, use glucagon injection or nasal spray baqsimi and call 911. Can repeat again in 15 min if still unconscious.  Return in about 14 weeks (around 12/16/2022) for visit, labs today.   I have reviewed current medications, nurse's notes, allergies, vital signs, past medical and surgical history, family medical history, and social history for this encounter. Counseled  patient on symptoms, examination findings, lab findings, imaging results, treatment decisions and monitoring and prognosis. The patient understood the recommendations and agrees with the treatment plan. All questions regarding treatment plan were fully answered.  [place], MD  09/09/22    History of Present Illness PATSYE LETTMAN is a 72 y.o. year old female who presents for evaluation of Type II diabetes mellitus.  Richell Marotz Teachey was first diagnosed in 1989.    Home diabetes regimen: Non-insulin hypoglycemic drugs: Farxiga 10 mg daily Insulin regimen: Lantus 20 units daily, Humalog 6 units for small meal, 10 units for medium meal, and 15 units for big meal   Previous history: Non-insulin hypoglycemic drugs previously used: Unknown Insulin was started in 2015 Previously was on NPH insulin  COMPLICATIONS -  MI/Stroke -  retinopathy + neuropathy + nephropathy (s/p kidney transplant, on prednisone 5 mg every day)  BLOOD SUGAR DATA  CGM interpretation: At today's visit, we reviewed her CGM downloads. The full report is scanned in the media. Reviewing the CGM trends, BG are low in the morning and high around dinner time.    Physical Exam  BP (!) 140/70   Pulse 62   Ht 5\' 2"  (1.575 m)   Wt 190 lb 12.8 oz (86.5 kg)   SpO2 97%   BMI 34.90 kg/m    Constitutional: well developed, well nourished Head: normocephalic, atraumatic Eyes: sclera anicteric, no redness Neck: supple Lungs: normal respiratory effort Neurology: alert and oriented Skin: dry, no appreciable rashes Musculoskeletal: no appreciable defects Psychiatric: normal mood and affect Diabetic Foot Exam - Simple   No data filed      Current Medications Patient's Medications  New Prescriptions   No medications on file  Previous Medications   APIXABAN (ELIQUIS) 5 MG TABS TABLET    Take 1 tablet (5 mg total) by mouth 2 (two) times daily.   ATORVASTATIN (LIPITOR) 20 MG TABLET    Take 1 tablet (20 mg  total) by mouth daily.   BD INSULIN SYRINGE U/F 31G X 5/16" 0.5 ML MISC    Inject 1 each into the skin daily at 6pm.   CALCITRIOL (ROCALTROL) 0.5 MCG CAPSULE    Take 0.5 mcg by mouth as directed. M/w/f   CICLOPIROX (PENLAC) 8 % SOLUTION    Apply 1 Application topically at bedtime.   CLONIDINE (CATAPRES) 0.2 MG TABLET    Take 0.1 mg by mouth daily.   CONTINUOUS BLOOD GLUC SENSOR (FREESTYLE LIBRE 2 SENSOR) MISC    1 Device by Does not apply route every 14 (fourteen) days.   FARXIGA 10 MG TABS TABLET    Take 10 mg by mouth daily.   FISH OIL-OMEGA-3 FATTY ACIDS 1000 MG CAPSULE    Take 1 capsule by mouth daily.   FUROSEMIDE (LASIX) 40 MG TABLET    Take 40 mg by mouth 2 (two) times daily.   GLUCOSE BLOOD (ONETOUCH  VERIO) TEST STRIP    1 each by Other route 2 (two) times daily. And lancets 2/day Dx code: E11.9   HYDRALAZINE (APRESOLINE) 50 MG TABLET    Take 25 mg by mouth 3 (three) times daily.   INSULIN GLARGINE (LANTUS) 100 UNIT/ML INJECTION    Inject 18 units into skin every morning.   INSULIN LISPRO (HUMALOG) 100 UNIT/ML INJECTION    Take 10-14 units before each meal   INSULIN PEN NEEDLE (PEN NEEDLES) 33G X 4 MM MISC    Use to inject insulin 1 pen needle/day   INSULIN SYRINGE-NEEDLE U-100 (BD INSULIN SYRINGE U/F) 31G X 5/16" 0.3 ML MISC    Use with insulin 4 times daily   LABETALOL (NORMODYNE) 100 MG TABLET    1 in the morning 1 in the evening   LANCETS (ONETOUCH ULTRASOFT) LANCETS    Use to check blood sugar 2 times per day   LEVOTHYROXINE (SYNTHROID) 112 MCG TABLET    Take 1 whole tablet 6 days a week and 0.5 tablet 1 day a week.   LIDOCAINE (LIDODERM) 5 %    Place 3 patches onto the skin daily. Remove & Discard patch within 12 hours or as directed by MD   MULTIPLE VITAMINS-MINERALS (MULTIVITAMIN WITH MINERALS) TABLET    Take 1 tablet by mouth daily.   MYCOPHENOLATE (MYFORTIC) 180 MG EC TABLET    Take 180 mg by mouth 2 (two) times daily.   OMEPRAZOLE (PRILOSEC) 20 MG CAPSULE    Take 20 mg by  mouth daily.   PREDNISONE (DELTASONE) 5 MG TABLET    Take 5 mg by mouth daily with breakfast.   SULFAMETHOXAZOLE-TRIMETHOPRIM (BACTRIM,SEPTRA) 400-80 MG PER TABLET    1 tablet. On Monday Wednesday and Friday   TACROLIMUS (PROGRAF) 1 MG CAPSULE    Take 4 mg by mouth. 6 tabs in the morning 6 tabs in the evening   TRIAZOLAM (HALCION) 0.25 MG TABLET    2 tablets by mouth at bedtime  Modified Medications   No medications on file  Discontinued Medications   No medications on file    Allergies Allergies  Allergen Reactions   Lisinopril Swelling   Trazodone Hcl     Other reaction(s): depression    Past Medical History Past Medical History:  Diagnosis Date   Chronic kidney disease    ESRD-  Hemo- M_W_F   CONGESTIVE HEART FAILURE 12/14/2006   CORONARY ARTERY DISEASE 12/14/2006   Depression    not recently   DIABETES MELLITUS, TYPE II 12/14/2006   Type 2   HYPERLIPIDEMIA 12/14/2006   HYPERTENSION 12/14/2006   Hypoparathyroidism (HCC) 12/21/2006   Hypopotassemia 01/13/2007   HYPOTHYROIDISM, POSTSURGICAL 12/15/2006   NEOPLASM, MALIGNANT, THYROID GLAND 12/15/2006   Stage 1 papillary adenocarcinoma, one very small focus each lobe (8mm, 2mm)  6/09: CT neck: no evidence of recurrence   OBSTRUCTIVE SLEEP APNEA 02/23/2007   does not use a cpap   Orbital fracture (HCC)    left eye   Shortness of breath dyspnea    with exertion    Past Surgical History Past Surgical History:  Procedure Laterality Date   BASCILIC VEIN TRANSPOSITION Right 02/07/2014   Procedure: FIRST STAGE BASILIC VEIN TRANSPOSITION;  Surgeon: Fransisco Hertz, MD;  Location: [facility] OR;  Service: Vascular;  Laterality: Right;   BASCILIC VEIN TRANSPOSITION Right 05/30/2014   Procedure: SECOND STAGE BASILIC VEIN TRANSPOSITION;  Surgeon: Fransisco Hertz, MD;  Location: [facility] OR;  Service: Vascular;  Laterality: Right;   BREAST  SURGERY Left    nipple leaking, had surgery to fix the leak   CARDIAC CATHETERIZATION     ? 10 years ago    CHOLECYSTECTOMY     COLONOSCOPY     ORBITAL FRACTURE SURGERY     OVARY SURGERY     THYROIDECTOMY  2008   TONSILLECTOMY     TUBAL LIGATION      Family History family history includes COPD in her brother and another family member; Cancer in her father and another family member; Diabetes in her mother and another family member; Drug abuse in her brother; Heart attack in her brother; Heart disease in an other family member; Heart failure in her mother; Hyperlipidemia in an other family member; Hypertension in an other family member; Stroke in an other family member.  Social History Social History   Socioeconomic History   Marital status: Widowed    Spouse name: Not on file   Number of children: 2   Years of education: Not on file   Highest education level: Not on file  Occupational History   Not on file  Tobacco Use   Smoking status: Former    Packs/day: 0.25    Years: 10.00    Additional pack years: 0.00    Total pack years: 2.50    Types: Cigarettes    Quit date: 2000    Years since quitting: 24.5   Smokeless tobacco: Never   Tobacco comments:    "quit years ago"  Vaping Use   Vaping Use: Never used  Substance and Sexual Activity   Alcohol use: No    Alcohol/week: 0.0 standard drinks of alcohol   Drug use: No   Sexual activity: Not on file  Other Topics Concern   Not on file  Social History Narrative   Not on file   Social Determinants of Health   Financial Resource Strain: Not on file  Food Insecurity: Not on file  Transportation Needs: Not on file  Physical Activity: Not on file  Stress: Not on file  Social Connections: Not on file  Intimate Partner Violence: Not on file    Lab Results  Component Value Date   HGBA1C 6.2 (A) 09/09/2022   HGBA1C 6.5 (A) 05/13/2022   HGBA1C 7.0 10/08/2021   Lab Results  Component Value Date   CHOL 400 (H) 01/17/2015   Lab Results  Component Value Date   HDL 64.90 01/17/2015   Lab Results  Component Value Date    LDLCALC 99 10/08/2021   Lab Results  Component Value Date   TRIG 246.0 (H) 01/17/2015   Lab Results  Component Value Date   CHOLHDL 6 01/17/2015   Lab Results  Component Value Date   CREATININE 2.32 (H) 05/13/2022   Lab Results  Component Value Date   GFR 20.33 (L) 05/13/2022   Lab Results  Component Value Date   MICROALBUR 83.4 (H) 04/07/2013      Component Value Date/Time   NA 143 05/13/2022 1337   K 3.7 05/13/2022 1337   CL 101 05/13/2022 1337   CO2 28 05/13/2022 1337   GLUCOSE 90 05/13/2022 1337   BUN 39 (H) 05/13/2022 1337   CREATININE 2.32 (H) 05/13/2022 1337   CALCIUM 8.8 05/13/2022 1337   CALCIUM 14.5 (HH) 01/19/2014 2354   PROT 8.9 (H) 01/19/2014 1905   ALBUMIN 3.9 11/01/2021 0911   AST 21 01/19/2014 1905   ALT 20 01/19/2014 1905   ALKPHOS 86 01/19/2014 1905   BILITOT 0.5 01/19/2014 1905  GFRNONAA 7 (L) 01/22/2014 0505   GFRAA 8 (L) 01/22/2014 0505      Latest Ref Rng & Units 05/13/2022    1:37 PM 11/01/2021    9:11 AM 12/14/2018    7:52 AM  BMP  Glucose 70 - 99 mg/dL 90  39  540   BUN 6 - 23 mg/dL 39  41  22   Creatinine 0.40 - 1.20 mg/dL 9.81  1.91  4.78   Sodium 135 - 145 mEq/L 143  146  140   Potassium 3.5 - 5.1 mEq/L 3.7  3.6  4.0   Chloride 96 - 112 mEq/L 101  104  99   CO2 19 - 32 mEq/L 28  31  30    Calcium 8.4 - 10.5 mg/dL 8.8  8.6  8.6        Component Value Date/Time   WBC 8.8 01/21/2014 0610   RBC 3.22 (L) 01/21/2014 0610   HGB 9.9 (L) 05/30/2014 0823   HCT 29.0 (L) 05/30/2014 0823   PLT 261 01/21/2014 0610   MCV 88.2 01/21/2014 0610   MCH 28.9 01/21/2014 0610   MCHC 32.7 01/21/2014 0610   RDW 13.1 01/21/2014 0610   LYMPHSABS 2.4 01/19/2014 1905   MONOABS 1.0 01/19/2014 1905   EOSABS 0.1 01/19/2014 1905   BASOSABS 0.0 01/19/2014 1905     Parts of this note may have been dictated using voice recognition software. There may be variances in spelling and vocabulary which are unintentional. Not all errors are proofread. Please  notify the Thereasa Parkin if any discrepancies are noted or if the meaning of any statement is not clear.

## 2022-09-09 NOTE — Patient Instructions (Signed)
Continue Farxiga 10 mg daily Lantus 18 units daily, Humalog 6 units for small meal, 10 units for medium meal, and 16 units for big meal-take 15 min before meals    _____________   Goals of DM therapy:  Morning Fasting blood sugar: 80-140  Blood sugar before meals: 80-140 Bed time blood sugar: 100-150  A1C <7%, limited only by hypoglycemia  1.Diabetes medications and their side effects discussed, including hypoglycemia    2. Check blood glucose:  a) Always check blood sugars before driving. Please see below (under hypoglycemia) on how to manage b) Check a minimum of 3 times/day or more as needed when having symptoms of hypoglycemia.   c) Try to check blood glucose before sleeping/in the middle of the night to ensure that it is remaining stable and not dropping less than 100 d) Check blood glucose more often if sick  3. Diet: a) 3 meals per day schedule b: Restrict carbs to 60-70 grams (4 servings) per meal c) Colorful vegetables - 3 servings a day, and low sugar fruit 2 servings/day Plate control method: 1/4 plate protein, 1/4 starch, 1/2 green, yellow, or red vegetables d) Avoid carbohydrate snacks unless hypoglycemic episode, or increased physical activity  4. Regular exercise as tolerated, preferably 3 or more hours a week  5. Hypoglycemia: a)  Do not drive or operate machinery without first testing blood glucose to assure it is over 90 mg%, or if dizzy, lightheaded, not feeling normal, etc, or  if foot or leg is numb or weak. b)  If blood glucose less than 70, take four 5gm Glucose tabs or 15-30 gm Glucose gel.  Repeat every 15 min as needed until blood sugar is >100 mg/dl. If hypoglycemia persists then call 911.   6. Sick day management: a) Check blood glucose more often b) Continue usual therapy if blood sugars are elevated.   7. Contact the doctor immediately if blood glucose is frequently <60 mg/dl, or an episode of severe hypoglycemia occurs (where someone had to give  you glucose/  glucagon or if you passed out from a low blood glucose), or if blood glucose is persistently >350 mg/dl, for further management  8. A change in level of physical activity or exercise and a change in diet may also affect your blood sugar. Check blood sugars more often and call if needed.  Instructions: 1. Bring glucose meter, blood glucose records on every visit for review 2. Continue to follow up with primary care physician and other providers for medical care 3. Yearly eye  and foot exam 4. Please get blood work done prior to the next appointment

## 2022-09-12 ENCOUNTER — Other Ambulatory Visit: Payer: Self-pay

## 2022-09-12 DIAGNOSIS — Z794 Long term (current) use of insulin: Secondary | ICD-10-CM

## 2022-09-12 MED ORDER — INSULIN GLARGINE 100 UNIT/ML ~~LOC~~ SOLN
SUBCUTANEOUS | 1 refills | Status: DC
Start: 2022-09-12 — End: 2023-03-06

## 2022-09-15 ENCOUNTER — Other Ambulatory Visit: Payer: Self-pay | Admitting: "Endocrinology

## 2022-09-15 DIAGNOSIS — H2511 Age-related nuclear cataract, right eye: Secondary | ICD-10-CM | POA: Diagnosis not present

## 2022-09-15 MED ORDER — ROSUVASTATIN CALCIUM 40 MG PO TABS
40.0000 mg | ORAL_TABLET | Freq: Every day | ORAL | 1 refills | Status: DC
Start: 2022-09-15 — End: 2023-03-30

## 2022-09-15 NOTE — Progress Notes (Signed)
Prescribed rosuvastatin 40 mg every day

## 2022-09-17 DIAGNOSIS — Z940 Kidney transplant status: Secondary | ICD-10-CM | POA: Diagnosis not present

## 2022-09-17 DIAGNOSIS — I10 Essential (primary) hypertension: Secondary | ICD-10-CM | POA: Diagnosis not present

## 2022-09-17 DIAGNOSIS — E119 Type 2 diabetes mellitus without complications: Secondary | ICD-10-CM | POA: Diagnosis not present

## 2022-09-17 DIAGNOSIS — I4891 Unspecified atrial fibrillation: Secondary | ICD-10-CM | POA: Diagnosis not present

## 2022-09-19 DIAGNOSIS — Z940 Kidney transplant status: Secondary | ICD-10-CM | POA: Diagnosis not present

## 2022-09-19 DIAGNOSIS — E119 Type 2 diabetes mellitus without complications: Secondary | ICD-10-CM | POA: Diagnosis not present

## 2022-09-23 DIAGNOSIS — H2511 Age-related nuclear cataract, right eye: Secondary | ICD-10-CM | POA: Diagnosis not present

## 2022-10-09 DIAGNOSIS — Z940 Kidney transplant status: Secondary | ICD-10-CM | POA: Diagnosis not present

## 2022-10-09 DIAGNOSIS — D849 Immunodeficiency, unspecified: Secondary | ICD-10-CM | POA: Diagnosis not present

## 2022-10-09 DIAGNOSIS — Z79899 Other long term (current) drug therapy: Secondary | ICD-10-CM | POA: Diagnosis not present

## 2022-10-13 DIAGNOSIS — Z940 Kidney transplant status: Secondary | ICD-10-CM | POA: Diagnosis not present

## 2022-10-13 DIAGNOSIS — D849 Immunodeficiency, unspecified: Secondary | ICD-10-CM | POA: Diagnosis not present

## 2022-10-13 DIAGNOSIS — Z79899 Other long term (current) drug therapy: Secondary | ICD-10-CM | POA: Diagnosis not present

## 2022-10-15 ENCOUNTER — Other Ambulatory Visit: Payer: Self-pay

## 2022-10-15 MED ORDER — NITROGLYCERIN 0.4 MG SL SUBL: 0.4000 mg | SUBLINGUAL_TABLET | SUBLINGUAL | 3 refills | Status: AC | PRN

## 2022-11-28 DIAGNOSIS — E119 Type 2 diabetes mellitus without complications: Secondary | ICD-10-CM | POA: Diagnosis not present

## 2022-11-28 DIAGNOSIS — Z794 Long term (current) use of insulin: Secondary | ICD-10-CM | POA: Diagnosis not present

## 2022-11-28 DIAGNOSIS — B351 Tinea unguium: Secondary | ICD-10-CM | POA: Diagnosis not present

## 2022-12-25 ENCOUNTER — Other Ambulatory Visit: Payer: Self-pay

## 2022-12-25 DIAGNOSIS — E1165 Type 2 diabetes mellitus with hyperglycemia: Secondary | ICD-10-CM

## 2022-12-25 MED ORDER — INSULIN SYRINGE-NEEDLE U-100 31G X 5/16" 0.3 ML MISC
3 refills | Status: DC
Start: 2022-12-25 — End: 2023-12-29

## 2022-12-26 ENCOUNTER — Ambulatory Visit: Payer: Medicare Other | Admitting: "Endocrinology

## 2022-12-26 ENCOUNTER — Encounter: Payer: Self-pay | Admitting: "Endocrinology

## 2022-12-26 VITALS — BP 100/52 | HR 83 | Ht 62.0 in | Wt 191.4 lb

## 2022-12-26 DIAGNOSIS — Z7984 Long term (current) use of oral hypoglycemic drugs: Secondary | ICD-10-CM

## 2022-12-26 DIAGNOSIS — E782 Mixed hyperlipidemia: Secondary | ICD-10-CM | POA: Diagnosis not present

## 2022-12-26 DIAGNOSIS — Z794 Long term (current) use of insulin: Secondary | ICD-10-CM

## 2022-12-26 DIAGNOSIS — E11649 Type 2 diabetes mellitus with hypoglycemia without coma: Secondary | ICD-10-CM

## 2022-12-26 LAB — POCT GLYCOSYLATED HEMOGLOBIN (HGB A1C): Hemoglobin A1C: 6.8 % — AB (ref 4.0–5.6)

## 2022-12-26 NOTE — Progress Notes (Signed)
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 (s/p kidney transplant, on prednisone 5 mg every day) Lab Results  Component Value Date   GFR 21.61 (L) 09/09/2022   Hba1c goal less than 7, current Hba1c is  Lab Results  Component Value Date   HGBA1C 6.8 (A) 12/26/2022   Will recommend the following: Farxiga 10 mg daily Lantus 17 units daily, Humalog 6 units for small meal, 10 units for medium meal, and 16 units for big meal   No known contraindications to any of above medications  -Last LD and Tg are as follows: Lab Results  Component Value Date   LDLCALC 145 (H) 09/09/2022    Lab Results  Component Value Date   TRIG 160.0 (H) 09/09/2022   -On atorvastatin 20 mg every day, ordered repeat lab  -Follow low fat diet and exercise   -Blood pressure goal <140/90 - Microalbumin/creatinine goal < 30, ordered repeat lab  -Last MA/Cr is as follows: Lab Results  Component Value Date   MICROALBUR 43.4 (H) 09/09/2022   -not on ACE/ARB  -diet changes including salt restriction -limit eating outside -counseled BP targets per standards of diabetes care -uncontrolled blood pressure can lead to retinopathy, nephropathy and cardiovascular and atherosclerotic heart disease  Reviewed and counseled on: -A1C target -Blood sugar targets -Complications of uncontrolled diabetes  -Checking blood sugar before meals and bedtime and  bring log next visit -All medications with mechanism of action and side effects -Hypoglycemia management: rule of 15's, Glucagon Emergency Kit and medical alert ID -low-carb low-fat plate-method diet -At least 20 minutes of physical activity per day -Annual dilated retinal eye exam and foot exam -compliance and follow up needs -follow up as scheduled or earlier if problem gets worse  Call if blood sugar is less than 70 or consistently above 250    Take a 15 gm snack of carbohydrate at bedtime before you go to sleep if your blood sugar is less than 100.    If you are going to fast after midnight for a test or procedure, ask your physician for instructions on how to reduce/decrease your insulin dose.    Call if blood sugar is less than 70 or consistently above 250  -Treating a low sugar by rule of 15  (15 gms of sugar every 15 min until sugar is more than 70) If you feel your sugar is low, test your sugar to be sure If your sugar is low (less than 70), then take 15 grams of a fast acting Carbohydrate (3-4 glucose tablets or glucose gel or 4 ounces of juice or regular soda) Recheck your sugar 15 min after treating low to make sure it is more than 70 If sugar is still less than 70, treat again with 15 grams of carbohydrate          Don't drive the hour of hypoglycemia  If unconscious/unable to eat or drink by mouth, use glucagon injection or nasal spray baqsimi and call 911.  Can repeat again in 15 min if still unconscious.  Return in about 3 months (around 03/28/2023).   I have reviewed current medications, nurse's notes, allergies, vital signs, past medical and surgical history, family medical history, and social history for this encounter. Counseled patient on symptoms, examination findings, lab findings, imaging results, treatment decisions and monitoring and prognosis. The patient understood the recommendations and agrees with the treatment plan. All questions regarding treatment plan were  fully answered.  Sandy Earlston, MD  12/26/22   History of Present Illness Sandy Roy is a 72 y.o. year old female who presents for evaluation of Type II diabetes mellitus.  Ziya Fones Lutz was first diagnosed in 1989.    Home diabetes regimen: Non-insulin hypoglycemic drugs: Farxiga 10 mg daily Insulin regimen: Lantus 18 units daily, Humalog 6 units for small meal, 10 units for medium meal, and 15 units for big meal   Previous history: Non-insulin hypoglycemic drugs previously used: Unknown Insulin was started in 2015 Previously was on NPH insulin  COMPLICATIONS -  MI/Stroke -  retinopathy + neuropathy + nephropathy (s/p kidney transplant, on prednisone 5 mg every day)  BLOOD SUGAR DATA  CGM interpretation: At today's visit, we reviewed her CGM downloads. The full report is scanned in the media. Reviewing the CGM trends, BG are low in the morning and high around lunch time with random daytime lows.    Physical Exam  BP (!) 100/52   Pulse 83   Ht 5\' 2"  (1.575 m)   Wt 191 lb 6.4 oz (86.8 kg)   SpO2 95%   BMI 35.01 kg/m    Constitutional: well developed, well nourished Head: normocephalic, atraumatic Eyes: sclera anicteric, no redness Neck: supple Lungs: normal respiratory effort Neurology: alert and oriented Skin: dry, no appreciable rashes Musculoskeletal: no appreciable defects Psychiatric: normal mood and affect Diabetic Foot Exam - Simple   No data filed      Current Medications Patient's Medications  New Prescriptions   No medications on file  Previous Medications   APIXABAN (ELIQUIS) 5 MG TABS TABLET    Take 1 tablet (5 mg total) by mouth 2 (two) times daily.   BD INSULIN SYRINGE U/F 31G X 5/16" 0.5 ML MISC    Inject 1 each into the skin daily at 6pm.   CALCITRIOL (ROCALTROL) 0.5 MCG CAPSULE    Take 0.5 mcg by mouth as directed. M/w/f   CICLOPIROX (PENLAC) 8 % SOLUTION    Apply 1 Application topically at bedtime.   CLONIDINE (CATAPRES) 0.2 MG  TABLET    Take 0.1 mg by mouth daily.   CONTINUOUS BLOOD GLUC SENSOR (FREESTYLE LIBRE 2 SENSOR) MISC    1 Device by Does not apply route every 14 (fourteen) days.   FARXIGA 10 MG TABS TABLET    Take 10 mg by mouth daily.   FISH OIL-OMEGA-3 FATTY ACIDS 1000 MG CAPSULE    Take 1 capsule by mouth daily.   FUROSEMIDE (LASIX) 40 MG TABLET    Take 40 mg by mouth 2 (two) times daily.   GLUCOSE BLOOD (ONETOUCH VERIO) TEST STRIP    1 each by Other route 2 (two) times daily. And lancets 2/day Dx code: E11.9   HYDRALAZINE (APRESOLINE) 50 MG TABLET    Take 25 mg by mouth 3 (three) times daily.   INSULIN GLARGINE (LANTUS) 100 UNIT/ML INJECTION    Inject 18 units into skin every morning.   INSULIN LISPRO (HUMALOG) 100 UNIT/ML INJECTION    Take 10-14 units  before each meal   INSULIN PEN NEEDLE (PEN NEEDLES) 33G X 4 MM MISC    Use to inject insulin 1 pen needle/day   INSULIN SYRINGE-NEEDLE U-100 (BD INSULIN SYRINGE U/F) 31G X 5/16" 0.3 ML MISC    Use with insulin 4 times daily   LABETALOL (NORMODYNE) 100 MG TABLET    1 in the morning 1 in the evening   LANCETS (ONETOUCH ULTRASOFT) LANCETS    Use to check blood sugar 2 times per day   LEVOTHYROXINE (SYNTHROID) 112 MCG TABLET    Take 1 whole tablet 6 days a week and 0.5 tablet 1 day a week.   LIDOCAINE (LIDODERM) 5 %    Place 3 patches onto the skin daily. Remove & Discard patch within 12 hours or as directed by MD   MULTIPLE VITAMINS-MINERALS (MULTIVITAMIN WITH MINERALS) TABLET    Take 1 tablet by mouth daily.   MYCOPHENOLATE (MYFORTIC) 180 MG EC TABLET    Take 180 mg by mouth 2 (two) times daily.   NITROGLYCERIN (NITROSTAT) 0.4 MG SL TABLET    Place 1 tablet (0.4 mg total) under the tongue every 5 (five) minutes as needed for chest pain.   OMEPRAZOLE (PRILOSEC) 20 MG CAPSULE    Take 20 mg by mouth daily.   PREDNISONE (DELTASONE) 5 MG TABLET    Take 5 mg by mouth daily with breakfast.   ROSUVASTATIN (CRESTOR) 40 MG TABLET    Take 1 tablet (40 mg total) by mouth  daily.   SULFAMETHOXAZOLE-TRIMETHOPRIM (BACTRIM,SEPTRA) 400-80 MG PER TABLET    1 tablet. On Monday Wednesday and Friday   TACROLIMUS (PROGRAF) 1 MG CAPSULE    Take 4 mg by mouth. 6 tabs in the morning 6 tabs in the evening   TRIAZOLAM (HALCION) 0.25 MG TABLET    2 tablets by mouth at bedtime  Modified Medications   No medications on file  Discontinued Medications   No medications on file    Allergies Allergies  Allergen Reactions   Lisinopril Swelling   Trazodone Hcl     Other reaction(s): depression    Past Medical History Past Medical History:  Diagnosis Date   Chronic kidney disease    ESRD-  Hemo- M_W_F   CONGESTIVE HEART FAILURE 12/14/2006   CORONARY ARTERY DISEASE 12/14/2006   Depression    not recently   DIABETES MELLITUS, TYPE II 12/14/2006   Type 2   HYPERLIPIDEMIA 12/14/2006   HYPERTENSION 12/14/2006   Hypoparathyroidism (HCC) 12/21/2006   Hypopotassemia 01/13/2007   HYPOTHYROIDISM, POSTSURGICAL 12/15/2006   NEOPLASM, MALIGNANT, THYROID GLAND 12/15/2006   Stage 1 papillary adenocarcinoma, one very small focus each lobe (8mm, 2mm)  6/09: CT neck: no evidence of recurrence   OBSTRUCTIVE SLEEP APNEA 02/23/2007   does not use a cpap   Orbital fracture (HCC)    left eye   Shortness of breath dyspnea    with exertion    Past Surgical History Past Surgical History:  Procedure Laterality Date   BASCILIC VEIN TRANSPOSITION Right 02/07/2014   Procedure: FIRST STAGE BASILIC VEIN TRANSPOSITION;  Surgeon: Fransisco Hertz, MD;  Location[facility] OR;  Service: Vascular;  Laterality: Right;   BASCILIC VEIN TRANSPOSITION Right 05/30/2014   Procedure: SECOND STAGE BASILIC VEIN TRANSPOSITION;  Surgeon: Fransisco Hertz, MD;  Location: [facility] OR;  Service: Vascular;  Laterality: Right;   BREAST SURGERY Left    nipple leaking, had surgery to fix the leak   CARDIAC CATHETERIZATION     ? 10 years  ago   CHOLECYSTECTOMY     COLONOSCOPY     ORBITAL FRACTURE SURGERY     OVARY SURGERY      THYROIDECTOMY  2008   TONSILLECTOMY     TUBAL LIGATION      Family History family history includes COPD in her brother and another family member; Cancer in her father and another family member; Diabetes in her mother and another family member; Drug abuse in her brother; Heart attack in her brother; Heart disease in an other family member; Heart failure in her mother; Hyperlipidemia in an other family member; Hypertension in an other family member; Stroke in an other family member.  Social History Social History   Socioeconomic History   Marital status: Widowed    Spouse name: Not on file   Number of children: 2   Years of education: Not on file   Highest education level: Not on file  Occupational History   Not on file  Tobacco Use   Smoking status: Former    Current packs/day: 0.00    Average packs/day: 0.3 packs/day for 10.0 years (2.5 ttl pk-yrs)    Types: Cigarettes    Start date: 79    Quit date: 2000    Years since quitting: 24.8   Smokeless tobacco: Never   Tobacco comments:    "quit years ago"  Vaping Use   Vaping status: Never Used  Substance and Sexual Activity   Alcohol use: No    Alcohol/week: 0.0 standard drinks of alcohol   Drug use: No   Sexual activity: Not on file  Other Topics Concern   Not on file  Social History Narrative   Not on file   Social Determinants of Health   Financial Resource Strain: Not on file  Food Insecurity: Not on file  Transportation Needs: Not on file  Physical Activity: Not on file  Stress: Not on file  Social Connections: Not on file  Intimate Partner Violence: Not on file    Lab Results  Component Value Date   HGBA1C 6.8 (A) 12/26/2022   HGBA1C 6.2 (A) 09/09/2022   HGBA1C 6.5 (A) 05/13/2022   Lab Results  Component Value Date   CHOL 236 (H) 09/09/2022   Lab Results  Component Value Date   HDL 59.80 09/09/2022   Lab Results  Component Value Date   LDLCALC 145 (H) 09/09/2022   Lab Results  Component  Value Date   TRIG 160.0 (H) 09/09/2022   Lab Results  Component Value Date   CHOLHDL 4 09/09/2022   Lab Results  Component Value Date   CREATININE 2.20 (H) 09/09/2022   Lab Results  Component Value Date   GFR 21.61 (L) 09/09/2022   Lab Results  Component Value Date   MICROALBUR 43.4 (H) 09/09/2022      Component Value Date/Time   NA 145 09/09/2022 0835   K 3.6 09/09/2022 0835   CL 103 09/09/2022 0835   CO2 28 09/09/2022 0835   GLUCOSE 50 (L) 09/09/2022 0835   BUN 47 (H) 09/09/2022 0835   CREATININE 2.20 (H) 09/09/2022 0835   CALCIUM 7.7 (L) 09/09/2022 0835   CALCIUM 14.5 (HH) 01/19/2014 2354   PROT 6.8 09/09/2022 0835   ALBUMIN 3.9 09/09/2022 0835   AST 26 09/09/2022 0835   ALT 24 09/09/2022 0835   ALKPHOS 85 09/09/2022 0835   BILITOT 0.6 09/09/2022 0835   GFRNONAA 7 (L) 01/22/2014 0505   GFRAA 8 (L) 01/22/2014 0505      Latest  Ref Rng & Units 09/09/2022    8:35 AM 05/13/2022    1:37 PM 11/01/2021    9:11 AM  BMP  Glucose 70 - 99 mg/dL 50  90  39   BUN 6 - 23 mg/dL 47  39  41   Creatinine 0.40 - 1.20 mg/dL 0.86  5.78  4.69   Sodium 135 - 145 mEq/L 145  143  146   Potassium 3.5 - 5.1 mEq/L 3.6  3.7  3.6   Chloride 96 - 112 mEq/L 103  101  104   CO2 19 - 32 mEq/L 28  28  31    Calcium 8.4 - 10.5 mg/dL 7.7  8.8  8.6        Component Value Date/Time   WBC 8.8 01/21/2014 0610   RBC 3.22 (L) 01/21/2014 0610   HGB 9.9 (L) 05/30/2014 0823   HCT 29.0 (L) 05/30/2014 0823   PLT 261 01/21/2014 0610   MCV 88.2 01/21/2014 0610   MCH 28.9 01/21/2014 0610   MCHC 32.7 01/21/2014 0610   RDW 13.1 01/21/2014 0610   LYMPHSABS 2.4 01/19/2014 1905   MONOABS 1.0 01/19/2014 1905   EOSABS 0.1 01/19/2014 1905   BASOSABS 0.0 01/19/2014 1905     Parts of this note may have been dictated using voice recognition software. There may be variances in spelling and vocabulary which are unintentional. Not all errors are proofread. Please notify the Thereasa Parkin if any discrepancies are noted  or if the meaning of any statement is not clear.

## 2022-12-26 NOTE — Patient Instructions (Addendum)
Decrease lantus to 17 units once a day Increase Humalog to 16 units before lunch Take humalog 15 min before meals

## 2022-12-30 DIAGNOSIS — E119 Type 2 diabetes mellitus without complications: Secondary | ICD-10-CM | POA: Diagnosis not present

## 2022-12-30 DIAGNOSIS — Z940 Kidney transplant status: Secondary | ICD-10-CM | POA: Diagnosis not present

## 2023-01-19 ENCOUNTER — Other Ambulatory Visit: Payer: Self-pay | Admitting: "Endocrinology

## 2023-01-19 ENCOUNTER — Telehealth: Payer: Self-pay | Admitting: "Endocrinology

## 2023-01-19 DIAGNOSIS — E890 Postprocedural hypothyroidism: Secondary | ICD-10-CM

## 2023-01-19 NOTE — Telephone Encounter (Signed)
[facility] asking for Refills for Patient- Libra 2 sensors,Levothyroxine . Please call [phone].

## 2023-01-20 ENCOUNTER — Other Ambulatory Visit: Payer: Self-pay

## 2023-01-20 DIAGNOSIS — E119 Type 2 diabetes mellitus without complications: Secondary | ICD-10-CM | POA: Diagnosis not present

## 2023-01-20 DIAGNOSIS — Z940 Kidney transplant status: Secondary | ICD-10-CM | POA: Diagnosis not present

## 2023-01-20 DIAGNOSIS — I4891 Unspecified atrial fibrillation: Secondary | ICD-10-CM | POA: Diagnosis not present

## 2023-01-20 DIAGNOSIS — E1165 Type 2 diabetes mellitus with hyperglycemia: Secondary | ICD-10-CM

## 2023-01-20 DIAGNOSIS — E890 Postprocedural hypothyroidism: Secondary | ICD-10-CM

## 2023-01-20 DIAGNOSIS — B029 Zoster without complications: Secondary | ICD-10-CM | POA: Diagnosis not present

## 2023-01-20 DIAGNOSIS — I10 Essential (primary) hypertension: Secondary | ICD-10-CM | POA: Diagnosis not present

## 2023-01-20 DIAGNOSIS — B348 Other viral infections of unspecified site: Secondary | ICD-10-CM | POA: Diagnosis not present

## 2023-01-20 MED ORDER — FREESTYLE LIBRE 2 SENSOR MISC: 1.0000 | 3 refills | Status: DC

## 2023-01-20 MED ORDER — LEVOTHYROXINE SODIUM 112 MCG PO TABS: ORAL_TABLET | ORAL | 3 refills | Status: DC

## 2023-01-20 NOTE — Telephone Encounter (Signed)
Levothyroxine and Libre 2 Sensors have been sent to the pharmacy in [place] and Myrian is aware

## 2023-01-22 ENCOUNTER — Other Ambulatory Visit: Payer: Self-pay

## 2023-01-22 DIAGNOSIS — E1165 Type 2 diabetes mellitus with hyperglycemia: Secondary | ICD-10-CM

## 2023-01-22 MED ORDER — INSULIN LISPRO 100 UNIT/ML IJ SOLN: INTRAMUSCULAR | 2 refills | Status: AC

## 2023-01-30 ENCOUNTER — Other Ambulatory Visit: Payer: Self-pay | Admitting: Family Medicine

## 2023-01-30 ENCOUNTER — Ambulatory Visit
Admission: RE | Admit: 2023-01-30 | Discharge: 2023-01-30 | Disposition: A | Discharge status: Home / Self Care | Payer: Medicare Other | Source: Ambulatory Visit | Attending: Family Medicine | Admitting: Family Medicine

## 2023-01-30 DIAGNOSIS — J069 Acute upper respiratory infection, unspecified: Secondary | ICD-10-CM

## 2023-01-30 DIAGNOSIS — I129 Hypertensive chronic kidney disease with stage 1 through stage 4 chronic kidney disease, or unspecified chronic kidney disease: Secondary | ICD-10-CM | POA: Diagnosis not present

## 2023-01-30 DIAGNOSIS — Z794 Long term (current) use of insulin: Secondary | ICD-10-CM | POA: Diagnosis not present

## 2023-01-30 DIAGNOSIS — I480 Paroxysmal atrial fibrillation: Secondary | ICD-10-CM | POA: Diagnosis not present

## 2023-01-30 DIAGNOSIS — D6869 Other thrombophilia: Secondary | ICD-10-CM | POA: Diagnosis not present

## 2023-01-30 DIAGNOSIS — D849 Immunodeficiency, unspecified: Secondary | ICD-10-CM | POA: Diagnosis not present

## 2023-01-30 DIAGNOSIS — R059 Cough, unspecified: Secondary | ICD-10-CM | POA: Diagnosis not present

## 2023-01-30 DIAGNOSIS — N184 Chronic kidney disease, stage 4 (severe): Secondary | ICD-10-CM | POA: Diagnosis not present

## 2023-01-30 DIAGNOSIS — E039 Hypothyroidism, unspecified: Secondary | ICD-10-CM | POA: Diagnosis not present

## 2023-01-30 DIAGNOSIS — E1122 Type 2 diabetes mellitus with diabetic chronic kidney disease: Secondary | ICD-10-CM | POA: Diagnosis not present

## 2023-02-02 DIAGNOSIS — E782 Mixed hyperlipidemia: Secondary | ICD-10-CM | POA: Diagnosis not present

## 2023-02-02 DIAGNOSIS — J069 Acute upper respiratory infection, unspecified: Secondary | ICD-10-CM | POA: Diagnosis not present

## 2023-03-02 DIAGNOSIS — R42 Dizziness and giddiness: Secondary | ICD-10-CM | POA: Diagnosis not present

## 2023-03-02 DIAGNOSIS — I951 Orthostatic hypotension: Secondary | ICD-10-CM | POA: Diagnosis not present

## 2023-03-06 ENCOUNTER — Other Ambulatory Visit: Payer: Self-pay

## 2023-03-06 ENCOUNTER — Telehealth: Payer: Self-pay | Admitting: "Endocrinology

## 2023-03-06 DIAGNOSIS — E119 Type 2 diabetes mellitus without complications: Secondary | ICD-10-CM | POA: Diagnosis not present

## 2023-03-06 DIAGNOSIS — Z955 Presence of coronary angioplasty implant and graft: Secondary | ICD-10-CM | POA: Diagnosis not present

## 2023-03-06 DIAGNOSIS — G252 Other specified forms of tremor: Secondary | ICD-10-CM | POA: Diagnosis not present

## 2023-03-06 DIAGNOSIS — R5383 Other fatigue: Secondary | ICD-10-CM | POA: Diagnosis not present

## 2023-03-06 DIAGNOSIS — Z79624 Long term (current) use of inhibitors of nucleotide synthesis: Secondary | ICD-10-CM | POA: Diagnosis not present

## 2023-03-06 DIAGNOSIS — L299 Pruritus, unspecified: Secondary | ICD-10-CM | POA: Diagnosis not present

## 2023-03-06 DIAGNOSIS — Z794 Long term (current) use of insulin: Secondary | ICD-10-CM | POA: Diagnosis not present

## 2023-03-06 DIAGNOSIS — R531 Weakness: Secondary | ICD-10-CM | POA: Diagnosis not present

## 2023-03-06 DIAGNOSIS — Z85850 Personal history of malignant neoplasm of thyroid: Secondary | ICD-10-CM | POA: Diagnosis not present

## 2023-03-06 DIAGNOSIS — Z79621 Long term (current) use of calcineurin inhibitor: Secondary | ICD-10-CM | POA: Diagnosis not present

## 2023-03-06 DIAGNOSIS — E785 Hyperlipidemia, unspecified: Secondary | ICD-10-CM | POA: Diagnosis not present

## 2023-03-06 DIAGNOSIS — E1165 Type 2 diabetes mellitus with hyperglycemia: Secondary | ICD-10-CM

## 2023-03-06 DIAGNOSIS — N179 Acute kidney failure, unspecified: Secondary | ICD-10-CM | POA: Diagnosis not present

## 2023-03-06 DIAGNOSIS — Z87891 Personal history of nicotine dependence: Secondary | ICD-10-CM | POA: Diagnosis not present

## 2023-03-06 DIAGNOSIS — I10 Essential (primary) hypertension: Secondary | ICD-10-CM | POA: Diagnosis not present

## 2023-03-06 DIAGNOSIS — E890 Postprocedural hypothyroidism: Secondary | ICD-10-CM | POA: Diagnosis not present

## 2023-03-06 DIAGNOSIS — R42 Dizziness and giddiness: Secondary | ICD-10-CM | POA: Diagnosis not present

## 2023-03-06 DIAGNOSIS — R0602 Shortness of breath: Secondary | ICD-10-CM | POA: Diagnosis not present

## 2023-03-06 DIAGNOSIS — I2510 Atherosclerotic heart disease of native coronary artery without angina pectoris: Secondary | ICD-10-CM | POA: Diagnosis not present

## 2023-03-06 DIAGNOSIS — Z20822 Contact with and (suspected) exposure to covid-19: Secondary | ICD-10-CM | POA: Diagnosis not present

## 2023-03-06 DIAGNOSIS — I161 Hypertensive emergency: Secondary | ICD-10-CM | POA: Diagnosis not present

## 2023-03-06 DIAGNOSIS — I951 Orthostatic hypotension: Secondary | ICD-10-CM | POA: Diagnosis not present

## 2023-03-06 DIAGNOSIS — R0989 Other specified symptoms and signs involving the circulatory and respiratory systems: Secondary | ICD-10-CM | POA: Diagnosis not present

## 2023-03-06 DIAGNOSIS — Z7901 Long term (current) use of anticoagulants: Secondary | ICD-10-CM | POA: Diagnosis not present

## 2023-03-06 DIAGNOSIS — R059 Cough, unspecified: Secondary | ICD-10-CM | POA: Diagnosis not present

## 2023-03-06 DIAGNOSIS — I482 Chronic atrial fibrillation, unspecified: Secondary | ICD-10-CM | POA: Diagnosis not present

## 2023-03-06 DIAGNOSIS — I672 Cerebral atherosclerosis: Secondary | ICD-10-CM | POA: Diagnosis not present

## 2023-03-06 MED ORDER — INSULIN GLARGINE 100 UNIT/ML ~~LOC~~ SOLN: SUBCUTANEOUS | 1 refills | Status: DC

## 2023-03-06 NOTE — Telephone Encounter (Signed)
[facility] is requesting a refill on [address] for this patient they advised they have sent several requests and have had no response. Please call [phone]. Ask for Morrie Sheldon

## 2023-03-07 DIAGNOSIS — I951 Orthostatic hypotension: Secondary | ICD-10-CM | POA: Diagnosis not present

## 2023-03-07 DIAGNOSIS — I517 Cardiomegaly: Secondary | ICD-10-CM | POA: Diagnosis not present

## 2023-03-07 DIAGNOSIS — E1122 Type 2 diabetes mellitus with diabetic chronic kidney disease: Secondary | ICD-10-CM | POA: Diagnosis not present

## 2023-03-07 DIAGNOSIS — R0600 Dyspnea, unspecified: Secondary | ICD-10-CM | POA: Diagnosis not present

## 2023-03-07 DIAGNOSIS — N183 Chronic kidney disease, stage 3 unspecified: Secondary | ICD-10-CM | POA: Diagnosis not present

## 2023-03-07 DIAGNOSIS — I358 Other nonrheumatic aortic valve disorders: Secondary | ICD-10-CM | POA: Diagnosis not present

## 2023-03-07 DIAGNOSIS — Z794 Long term (current) use of insulin: Secondary | ICD-10-CM | POA: Diagnosis not present

## 2023-03-07 DIAGNOSIS — N179 Acute kidney failure, unspecified: Secondary | ICD-10-CM | POA: Diagnosis not present

## 2023-03-07 DIAGNOSIS — I129 Hypertensive chronic kidney disease with stage 1 through stage 4 chronic kidney disease, or unspecified chronic kidney disease: Secondary | ICD-10-CM | POA: Diagnosis not present

## 2023-03-07 DIAGNOSIS — Z940 Kidney transplant status: Secondary | ICD-10-CM | POA: Diagnosis not present

## 2023-03-08 DIAGNOSIS — Z940 Kidney transplant status: Secondary | ICD-10-CM | POA: Diagnosis not present

## 2023-03-08 DIAGNOSIS — Z794 Long term (current) use of insulin: Secondary | ICD-10-CM | POA: Diagnosis not present

## 2023-03-08 DIAGNOSIS — I129 Hypertensive chronic kidney disease with stage 1 through stage 4 chronic kidney disease, or unspecified chronic kidney disease: Secondary | ICD-10-CM | POA: Diagnosis not present

## 2023-03-08 DIAGNOSIS — N179 Acute kidney failure, unspecified: Secondary | ICD-10-CM | POA: Diagnosis not present

## 2023-03-08 DIAGNOSIS — I951 Orthostatic hypotension: Secondary | ICD-10-CM | POA: Diagnosis not present

## 2023-03-08 DIAGNOSIS — E1122 Type 2 diabetes mellitus with diabetic chronic kidney disease: Secondary | ICD-10-CM | POA: Diagnosis not present

## 2023-03-08 DIAGNOSIS — N183 Chronic kidney disease, stage 3 unspecified: Secondary | ICD-10-CM | POA: Diagnosis not present

## 2023-03-09 DIAGNOSIS — I4892 Unspecified atrial flutter: Secondary | ICD-10-CM | POA: Diagnosis not present

## 2023-03-09 DIAGNOSIS — I440 Atrioventricular block, first degree: Secondary | ICD-10-CM | POA: Diagnosis not present

## 2023-03-09 DIAGNOSIS — I491 Atrial premature depolarization: Secondary | ICD-10-CM | POA: Diagnosis not present

## 2023-03-17 DIAGNOSIS — I2510 Atherosclerotic heart disease of native coronary artery without angina pectoris: Secondary | ICD-10-CM | POA: Diagnosis not present

## 2023-03-17 DIAGNOSIS — I480 Paroxysmal atrial fibrillation: Secondary | ICD-10-CM | POA: Diagnosis not present

## 2023-03-17 DIAGNOSIS — D849 Immunodeficiency, unspecified: Secondary | ICD-10-CM | POA: Diagnosis not present

## 2023-03-17 DIAGNOSIS — E1122 Type 2 diabetes mellitus with diabetic chronic kidney disease: Secondary | ICD-10-CM | POA: Diagnosis not present

## 2023-03-17 DIAGNOSIS — B0229 Other postherpetic nervous system involvement: Secondary | ICD-10-CM | POA: Diagnosis not present

## 2023-03-17 DIAGNOSIS — Z940 Kidney transplant status: Secondary | ICD-10-CM | POA: Diagnosis not present

## 2023-03-17 DIAGNOSIS — I129 Hypertensive chronic kidney disease with stage 1 through stage 4 chronic kidney disease, or unspecified chronic kidney disease: Secondary | ICD-10-CM | POA: Diagnosis not present

## 2023-03-17 DIAGNOSIS — N184 Chronic kidney disease, stage 4 (severe): Secondary | ICD-10-CM | POA: Diagnosis not present

## 2023-03-17 DIAGNOSIS — E039 Hypothyroidism, unspecified: Secondary | ICD-10-CM | POA: Diagnosis not present

## 2023-03-30 ENCOUNTER — Ambulatory Visit: Payer: Medicare Other | Admitting: "Endocrinology

## 2023-03-30 ENCOUNTER — Encounter: Payer: Self-pay | Admitting: "Endocrinology

## 2023-03-30 VITALS — BP 90/50 | HR 61 | Resp 16 | Ht 62.0 in | Wt 194.0 lb

## 2023-03-30 DIAGNOSIS — Z794 Long term (current) use of insulin: Secondary | ICD-10-CM

## 2023-03-30 DIAGNOSIS — E782 Mixed hyperlipidemia: Secondary | ICD-10-CM | POA: Diagnosis not present

## 2023-03-30 DIAGNOSIS — I952 Hypotension due to drugs: Secondary | ICD-10-CM

## 2023-03-30 DIAGNOSIS — Z7984 Long term (current) use of oral hypoglycemic drugs: Secondary | ICD-10-CM

## 2023-03-30 DIAGNOSIS — E11649 Type 2 diabetes mellitus with hypoglycemia without coma: Secondary | ICD-10-CM

## 2023-03-30 LAB — POCT GLYCOSYLATED HEMOGLOBIN (HGB A1C): Hemoglobin A1C: 7.2 % — AB (ref 4.0–5.6)

## 2023-03-30 NOTE — Addendum Note (Signed)
Addended by: Altamese Connell on: 03/30/2023 11:01 AM   Modules accepted: Level of Service

## 2023-03-30 NOTE — Progress Notes (Addendum)
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 (s/p kidney transplant, on prednisone 5 mg every day) Lab Results  Component Value Date   GFR 21.61 (L) 09/09/2022   Hba1c goal less than 7, current Hba1c is  Lab Results  Component Value Date   HGBA1C 7.2 (A) 03/30/2023   Will recommend the following: Farxiga 10 mg daily Lantus 17 units daily Humalog 10 units tid  BP noted to be low at 90/50, patient recently discharged from hospital Instructed to hold all BO medication until BOP is >120/80 and discuss care with PCP and nephrologist  No known contraindications to any of above medications  -Last LD and Tg are as follows: Lab Results  Component Value Date   LDLCALC 145 (H) 09/09/2022    Lab Results  Component Value Date   TRIG 160.0 (H) 09/09/2022   -On atorvastatin 20 mg every day, ordered repeat lab  -Follow low fat diet and exercise   -Blood pressure goal <140/90 - Microalbumin/creatinine goal < 30, ordered repeat lab  -Last MA/Cr is as follows: Lab Results  Component Value Date   MICROALBUR 43.4 (H) 09/09/2022   -not on ACE/ARB  -diet changes including salt restriction -limit eating outside -counseled BP targets per standards of diabetes care -uncontrolled blood pressure can lead to retinopathy, nephropathy and cardiovascular and atherosclerotic heart disease  Reviewed and  counseled on: -A1C target -Blood sugar targets -Complications of uncontrolled diabetes  -Checking blood sugar before meals and bedtime and bring log next visit -All medications with mechanism of action and side effects -Hypoglycemia management: rule of 15's, Glucagon Emergency Kit and medical alert ID -low-carb low-fat plate-method diet -At least 20 minutes of physical activity per day -Annual dilated retinal eye exam and foot exam -compliance and follow up needs -follow up as scheduled or earlier if problem gets worse  Call if blood sugar is less than 70 or consistently above 250    Take a 15 gm snack of carbohydrate at bedtime before you go to sleep if your blood sugar is less than 100.    If you are going to fast after midnight for a test or procedure, ask your physician for instructions on how to reduce/decrease your insulin dose.    Call if blood sugar is less than 70 or consistently above 250  -Treating a low sugar by rule of 15  (15 gms of sugar every 15 min until sugar is more than 70) If you feel your sugar is low, test your sugar to be sure If your sugar is low (less than 70), then take 15 grams of a fast acting Carbohydrate (3-4 glucose tablets or glucose gel or 4 ounces of juice or regular soda) Recheck your sugar 15 min after treating low to make sure it is more than 70 If sugar is still less than 70, treat again with 15 grams of carbohydrate          Don't drive the hour  of hypoglycemia  If unconscious/unable to eat or drink by mouth, use glucagon injection or nasal spray baqsimi and call 911. Can repeat again in 15 min if still unconscious.  Return in about 3 months (around 06/28/2023).   I have reviewed current medications, nurse's notes, allergies, vital signs, past medical and surgical history, family medical history, and social history for this encounter. Counseled patient on symptoms, examination findings, lab findings, imaging results, treatment decisions and  monitoring and prognosis. The patient understood the recommendations and agrees with the treatment plan. All questions regarding treatment plan were fully answered.  Sandy [place], MD  03/30/23   History of Present Illness Sandy Roy is a 73 y.o. year old female who presents for evaluation of Type II diabetes mellitus.  Sandy Roy was first diagnosed in 1989.    Home diabetes regimen: Farxiga 10 mg daily Lantus 18 units daily Humalog 10 units tid 15 min before meals   Previous history: Non-insulin hypoglycemic drugs previously used: Unknown Insulin was started in 2015 Previously was on NPH insulin  COMPLICATIONS -  MI/Stroke -  retinopathy + neuropathy + nephropathy (s/p kidney transplant, on prednisone 5 mg every day)  BLOOD SUGAR DATA  CGM interpretation: At today's visit, we reviewed her CGM downloads. The full report is scanned in the media. Reviewing the CGM trends, BG are low in the morning and mildly high in evening.   Physical Exam  BP (!) 90/50   Pulse 61   Resp 16   Ht 5\' 2"  (1.575 m)   Wt 194 lb (88 kg)   SpO2 97%   BMI 35.48 kg/m    Constitutional: well developed, well nourished Head: normocephalic, atraumatic Eyes: sclera anicteric, no redness Neck: supple Lungs: normal respiratory effort Neurology: alert and oriented Skin: dry, no appreciable rashes Musculoskeletal: no appreciable defects Psychiatric: normal mood and affect Diabetic Foot Exam - Simple   No data filed      Current Medications Patient's Medications  New Prescriptions   No medications on file  Previous Medications   APIXABAN (ELIQUIS) 5 MG TABS TABLET    Take 1 tablet (5 mg total) by mouth 2 (two) times daily.   ATORVASTATIN (LIPITOR) 10 MG TABLET    Take 10 mg by mouth daily.   BD INSULIN SYRINGE U/F 31G X 5/16" 0.5 ML MISC    Inject 1 each into the skin daily at 6pm.   CALCITRIOL (ROCALTROL) 0.5 MCG CAPSULE    Take 0.5 mcg by mouth as directed. M/w/f    CICLOPIROX (PENLAC) 8 % SOLUTION    Apply 1 Application topically at bedtime.   CLONIDINE (CATAPRES) 0.2 MG TABLET    Take 0.1 mg by mouth daily.   CONTINUOUS GLUCOSE SENSOR (FREESTYLE LIBRE 2 SENSOR) MISC    1 Device by Does not apply route every 14 (fourteen) days.   FARXIGA 10 MG TABS TABLET    Take 10 mg by mouth daily.   FISH OIL-OMEGA-3 FATTY ACIDS 1000 MG CAPSULE    Take 1 capsule by mouth daily.   FUROSEMIDE (LASIX) 40 MG TABLET    Take 40 mg by mouth 2 (two) times daily.   GLUCOSE BLOOD (ONETOUCH VERIO) TEST STRIP    1 each by Other route 2 (two) times daily. And lancets 2/day Dx code: E11.9   HYDRALAZINE (APRESOLINE) 50 MG TABLET    Take 25 mg by mouth in the morning and at bedtime.   INSULIN GLARGINE (LANTUS) 100 UNIT/ML INJECTION  Inject 18 units into skin every morning.   INSULIN LISPRO (HUMALOG) 100 UNIT/ML INJECTION    Take 10-14 units before each meal   INSULIN PEN NEEDLE (PEN NEEDLES) 33G X 4 MM MISC    Use to inject insulin 1 pen needle/day   INSULIN SYRINGE-NEEDLE U-100 (BD INSULIN SYRINGE U/F) 31G X 5/16" 0.3 ML MISC    Use with insulin 4 times daily   LABETALOL (NORMODYNE) 100 MG TABLET    1 in the morning 1 in the evening   LANCETS (ONETOUCH ULTRASOFT) LANCETS    Use to check blood sugar 2 times per day   LEVOTHYROXINE (SYNTHROID) 112 MCG TABLET    Take 1 whole tablet 6 days a week and 0.5 tablet 1 day a week.   LIDOCAINE (LIDODERM) 5 %    Place 3 patches onto the skin daily. Remove & Discard patch within 12 hours or as directed by MD   MULTIPLE VITAMINS-MINERALS (MULTIVITAMIN WITH MINERALS) TABLET    Take 1 tablet by mouth daily.   MYCOPHENOLATE (MYFORTIC) 180 MG EC TABLET    Take 180 mg by mouth 2 (two) times daily.   NITROGLYCERIN (NITROSTAT) 0.4 MG SL TABLET    Place 1 tablet (0.4 mg total) under the tongue every 5 (five) minutes as needed for chest pain.   OMEPRAZOLE (PRILOSEC) 20 MG CAPSULE    Take 20 mg by mouth daily.   PREDNISONE (DELTASONE) 5 MG TABLET    Take 5  mg by mouth daily with breakfast.   SULFAMETHOXAZOLE-TRIMETHOPRIM (BACTRIM,SEPTRA) 400-80 MG PER TABLET    1 tablet. On Monday Wednesday and Friday   TACROLIMUS (PROGRAF) 1 MG CAPSULE    Take 4 mg by mouth. 6 tabs in the morning 6 tabs in the evening   TRIAZOLAM (HALCION) 0.25 MG TABLET    2 tablets by mouth at bedtime  Modified Medications   No medications on file  Discontinued Medications   ROSUVASTATIN (CRESTOR) 40 MG TABLET    Take 1 tablet (40 mg total) by mouth daily.    Allergies Allergies  Allergen Reactions   Lisinopril Swelling   Trazodone Hcl     Other reaction(s): depression    Past Medical History Past Medical History:  Diagnosis Date   Chronic kidney disease    ESRD-  Hemo- M_W_F   CONGESTIVE HEART FAILURE 12/14/2006   CORONARY ARTERY DISEASE 12/14/2006   Depression    not recently   DIABETES MELLITUS, TYPE II 12/14/2006   Type 2   HYPERLIPIDEMIA 12/14/2006   HYPERTENSION 12/14/2006   Hypoparathyroidism (HCC) 12/21/2006   Hypopotassemia 01/13/2007   HYPOTHYROIDISM, POSTSURGICAL 12/15/2006   NEOPLASM, MALIGNANT, THYROID GLAND 12/15/2006   Stage 1 papillary adenocarcinoma, one very small focus each lobe (8mm, 2mm)  6/09: CT neck: no evidence of recurrence   OBSTRUCTIVE SLEEP APNEA 02/23/2007   does not use a cpap   Orbital fracture (HCC)    left eye   Shortness of breath dyspnea    with exertion    Past Surgical History Past Surgical History:  Procedure Laterality Date   BASCILIC VEIN TRANSPOSITION Right 02/07/2014   Procedure: FIRST STAGE BASILIC VEIN TRANSPOSITION;  Surgeon: Fransisco Hertz, MD;  Location: [facility];  Service: Vascular;  Laterality: Right;   BASCILIC VEIN TRANSPOSITION Right 05/30/2014   Procedure: SECOND STAGE BASILIC VEIN TRANSPOSITION;  Surgeon: Fransisco Hertz, MD;  Location: [facility] OR;  Service: Vascular;  Laterality: Right;   BREAST SURGERY Left    nipple leaking, had surgery  to fix the leak   CARDIAC CATHETERIZATION     ? 10 years ago    CHOLECYSTECTOMY     COLONOSCOPY     ORBITAL FRACTURE SURGERY     OVARY SURGERY     THYROIDECTOMY  2008   TONSILLECTOMY     TUBAL LIGATION      Family History family history includes COPD in her brother and another family member; Cancer in her father and another family member; Diabetes in her mother and another family member; Drug abuse in her brother; Heart attack in her brother; Heart disease in an other family member; Heart failure in her mother; Hyperlipidemia in an other family member; Hypertension in an other family member; Stroke in an other family member.  Social History Social History   Socioeconomic History   Marital status: Widowed    Spouse name: Not on file   Number of children: 2   Years of education: Not on file   Highest education level: Not on file  Occupational History   Not on file  Tobacco Use   Smoking status: Former    Current packs/day: 0.00    Average packs/day: 0.3 packs/day for 10.0 years (2.5 ttl pk-yrs)    Types: Cigarettes    Start date: 62    Quit date: 2000    Years since quitting: 25.0   Smokeless tobacco: Never   Tobacco comments:    "quit years ago"  Vaping Use   Vaping status: Never Used  Substance and Sexual Activity   Alcohol use: No    Alcohol/week: 0.0 standard drinks of alcohol   Drug use: No   Sexual activity: Not on file  Other Topics Concern   Not on file  Social History Narrative   Not on file   Social Drivers of Health   Financial Resource Strain: Not on file  Food Insecurity: Low Risk  (03/07/2023)   Received from [facility]   Hunger Vital Sign    Worried About Running Out of Food in the Last Year: Never true    Ran Out of Food in the Last Year: Never true  Transportation Needs: No Transportation Needs (03/07/2023)   Received from [facility]    In the past 12 months, has lack of reliable transportation kept you from medical appointments, meetings, work or from getting things needed for daily  living? : No  Physical Activity: Not on file  Stress: Not on file  Social Connections: Not on file  Intimate Partner Violence: Not on file    Lab Results  Component Value Date   HGBA1C 7.2 (A) 03/30/2023   HGBA1C 6.8 (A) 12/26/2022   HGBA1C 6.2 (A) 09/09/2022   Lab Results  Component Value Date   CHOL 236 (H) 09/09/2022   Lab Results  Component Value Date   HDL 59.80 09/09/2022   Lab Results  Component Value Date   LDLCALC 145 (H) 09/09/2022   Lab Results  Component Value Date   TRIG 160.0 (H) 09/09/2022   Lab Results  Component Value Date   CHOLHDL 4 09/09/2022   Lab Results  Component Value Date   CREATININE 2.20 (H) 09/09/2022   Lab Results  Component Value Date   GFR 21.61 (L) 09/09/2022   Lab Results  Component Value Date   MICROALBUR 43.4 (H) 09/09/2022      Component Value Date/Time   NA 145 09/09/2022 0835   K 3.6 09/09/2022 0835   CL 103 09/09/2022 0835  CO2 28 09/09/2022 0835   GLUCOSE 50 (L) 09/09/2022 0835   BUN 47 (H) 09/09/2022 0835   CREATININE 2.20 (H) 09/09/2022 0835   CALCIUM 7.7 (L) 09/09/2022 0835   CALCIUM 14.5 (HH) 01/19/2014 2354   PROT 6.8 09/09/2022 0835   ALBUMIN 3.9 09/09/2022 0835   AST 26 09/09/2022 0835   ALT 24 09/09/2022 0835   ALKPHOS 85 09/09/2022 0835   BILITOT 0.6 09/09/2022 0835   GFRNONAA 7 (L) 01/22/2014 0505   GFRAA 8 (L) 01/22/2014 0505      Latest Ref Rng & Units 09/09/2022    8:35 AM 05/13/2022    1:37 PM 11/01/2021    9:11 AM  BMP  Glucose 70 - 99 mg/dL 50  90  39   BUN 6 - 23 mg/dL 47  39  41   Creatinine 0.40 - 1.20 mg/dL 7.25  3.66  4.40   Sodium 135 - 145 mEq/L 145  143  146   Potassium 3.5 - 5.1 mEq/L 3.6  3.7  3.6   Chloride 96 - 112 mEq/L 103  101  104   CO2 19 - 32 mEq/L 28  28  31    Calcium 8.4 - 10.5 mg/dL 7.7  8.8  8.6        Component Value Date/Time   WBC 8.8 01/21/2014 0610   RBC 3.22 (L) 01/21/2014 0610   HGB 9.9 (L) 05/30/2014 0823   HCT 29.0 (L) 05/30/2014 0823   PLT 261  01/21/2014 0610   MCV 88.2 01/21/2014 0610   MCH 28.9 01/21/2014 0610   MCHC 32.7 01/21/2014 0610   RDW 13.1 01/21/2014 0610   LYMPHSABS 2.4 01/19/2014 1905   MONOABS 1.0 01/19/2014 1905   EOSABS 0.1 01/19/2014 1905   BASOSABS 0.0 01/19/2014 1905     Parts of this note may have been dictated using voice recognition software. There may be variances in spelling and vocabulary which are unintentional. Not all errors are proofread. Please notify the Thereasa Parkin if any discrepancies are noted or if the meaning of any statement is not clear.

## 2023-03-30 NOTE — Patient Instructions (Signed)

## 2023-03-31 ENCOUNTER — Encounter: Payer: Self-pay | Admitting: "Endocrinology

## 2023-04-06 DIAGNOSIS — R7989 Other specified abnormal findings of blood chemistry: Secondary | ICD-10-CM | POA: Diagnosis not present

## 2023-04-06 DIAGNOSIS — Z940 Kidney transplant status: Secondary | ICD-10-CM | POA: Diagnosis not present

## 2023-04-06 DIAGNOSIS — E119 Type 2 diabetes mellitus without complications: Secondary | ICD-10-CM | POA: Diagnosis not present

## 2023-05-06 ENCOUNTER — Telehealth: Payer: Self-pay

## 2023-05-06 MED ORDER — FREESTYLE LIBRE 2 READER DEVI: 0 refills | Status: AC

## 2023-05-06 MED ORDER — FREESTYLE LIBRE 2 PLUS SENSOR MISC: 1.0000 | 3 refills | Status: DC

## 2023-05-06 NOTE — Telephone Encounter (Signed)
Spoke to pt. Rx sent to pharmacy. 

## 2023-05-07 DIAGNOSIS — B0229 Other postherpetic nervous system involvement: Secondary | ICD-10-CM | POA: Diagnosis not present

## 2023-05-07 DIAGNOSIS — G5712 Meralgia paresthetica, left lower limb: Secondary | ICD-10-CM | POA: Diagnosis not present

## 2023-05-20 DIAGNOSIS — E119 Type 2 diabetes mellitus without complications: Secondary | ICD-10-CM | POA: Diagnosis not present

## 2023-05-20 DIAGNOSIS — I10 Essential (primary) hypertension: Secondary | ICD-10-CM | POA: Diagnosis not present

## 2023-05-20 DIAGNOSIS — I4891 Unspecified atrial fibrillation: Secondary | ICD-10-CM | POA: Diagnosis not present

## 2023-05-20 DIAGNOSIS — N189 Chronic kidney disease, unspecified: Secondary | ICD-10-CM | POA: Diagnosis not present

## 2023-05-20 DIAGNOSIS — Z940 Kidney transplant status: Secondary | ICD-10-CM | POA: Diagnosis not present

## 2023-05-21 DIAGNOSIS — M150 Primary generalized (osteo)arthritis: Secondary | ICD-10-CM | POA: Diagnosis not present

## 2023-05-21 DIAGNOSIS — G894 Chronic pain syndrome: Secondary | ICD-10-CM | POA: Diagnosis not present

## 2023-05-21 DIAGNOSIS — B0229 Other postherpetic nervous system involvement: Secondary | ICD-10-CM | POA: Diagnosis not present

## 2023-06-04 DIAGNOSIS — Z7901 Long term (current) use of anticoagulants: Secondary | ICD-10-CM | POA: Diagnosis not present

## 2023-06-04 DIAGNOSIS — N179 Acute kidney failure, unspecified: Secondary | ICD-10-CM | POA: Diagnosis not present

## 2023-06-04 DIAGNOSIS — I160 Hypertensive urgency: Secondary | ICD-10-CM | POA: Diagnosis not present

## 2023-06-04 DIAGNOSIS — R5383 Other fatigue: Secondary | ICD-10-CM | POA: Diagnosis not present

## 2023-06-04 DIAGNOSIS — K635 Polyp of colon: Secondary | ICD-10-CM | POA: Diagnosis not present

## 2023-06-04 DIAGNOSIS — I129 Hypertensive chronic kidney disease with stage 1 through stage 4 chronic kidney disease, or unspecified chronic kidney disease: Secondary | ICD-10-CM | POA: Diagnosis not present

## 2023-06-04 DIAGNOSIS — I482 Chronic atrial fibrillation, unspecified: Secondary | ICD-10-CM | POA: Diagnosis not present

## 2023-06-04 DIAGNOSIS — T8619 Other complication of kidney transplant: Secondary | ICD-10-CM | POA: Diagnosis not present

## 2023-06-04 DIAGNOSIS — R531 Weakness: Secondary | ICD-10-CM | POA: Diagnosis not present

## 2023-06-04 DIAGNOSIS — K921 Melena: Secondary | ICD-10-CM | POA: Diagnosis not present

## 2023-06-04 DIAGNOSIS — I480 Paroxysmal atrial fibrillation: Secondary | ICD-10-CM | POA: Diagnosis not present

## 2023-06-04 DIAGNOSIS — E890 Postprocedural hypothyroidism: Secondary | ICD-10-CM | POA: Diagnosis not present

## 2023-06-04 DIAGNOSIS — Z743 Need for continuous supervision: Secondary | ICD-10-CM | POA: Diagnosis not present

## 2023-06-04 DIAGNOSIS — Z794 Long term (current) use of insulin: Secondary | ICD-10-CM | POA: Diagnosis not present

## 2023-06-04 DIAGNOSIS — R627 Adult failure to thrive: Secondary | ICD-10-CM | POA: Diagnosis not present

## 2023-06-04 DIAGNOSIS — Z20822 Contact with and (suspected) exposure to covid-19: Secondary | ICD-10-CM | POA: Diagnosis not present

## 2023-06-04 DIAGNOSIS — R1111 Vomiting without nausea: Secondary | ICD-10-CM | POA: Diagnosis not present

## 2023-06-04 DIAGNOSIS — I2510 Atherosclerotic heart disease of native coronary artery without angina pectoris: Secondary | ICD-10-CM | POA: Diagnosis not present

## 2023-06-04 DIAGNOSIS — D509 Iron deficiency anemia, unspecified: Secondary | ICD-10-CM | POA: Diagnosis not present

## 2023-06-04 DIAGNOSIS — K5730 Diverticulosis of large intestine without perforation or abscess without bleeding: Secondary | ICD-10-CM | POA: Diagnosis not present

## 2023-06-04 DIAGNOSIS — E869 Volume depletion, unspecified: Secondary | ICD-10-CM | POA: Diagnosis not present

## 2023-06-04 DIAGNOSIS — K648 Other hemorrhoids: Secondary | ICD-10-CM | POA: Diagnosis not present

## 2023-06-04 DIAGNOSIS — R42 Dizziness and giddiness: Secondary | ICD-10-CM | POA: Diagnosis not present

## 2023-06-04 DIAGNOSIS — E1122 Type 2 diabetes mellitus with diabetic chronic kidney disease: Secondary | ICD-10-CM | POA: Diagnosis not present

## 2023-06-04 DIAGNOSIS — E785 Hyperlipidemia, unspecified: Secondary | ICD-10-CM | POA: Diagnosis not present

## 2023-06-04 DIAGNOSIS — R739 Hyperglycemia, unspecified: Secondary | ICD-10-CM | POA: Diagnosis not present

## 2023-06-04 DIAGNOSIS — R110 Nausea: Secondary | ICD-10-CM | POA: Diagnosis not present

## 2023-06-04 DIAGNOSIS — E876 Hypokalemia: Secondary | ICD-10-CM | POA: Diagnosis not present

## 2023-06-04 DIAGNOSIS — N183 Chronic kidney disease, stage 3 unspecified: Secondary | ICD-10-CM | POA: Diagnosis not present

## 2023-06-04 DIAGNOSIS — K625 Hemorrhage of anus and rectum: Secondary | ICD-10-CM | POA: Diagnosis not present

## 2023-06-05 DIAGNOSIS — I498 Other specified cardiac arrhythmias: Secondary | ICD-10-CM | POA: Diagnosis not present

## 2023-06-05 DIAGNOSIS — Z940 Kidney transplant status: Secondary | ICD-10-CM | POA: Diagnosis not present

## 2023-06-05 DIAGNOSIS — I129 Hypertensive chronic kidney disease with stage 1 through stage 4 chronic kidney disease, or unspecified chronic kidney disease: Secondary | ICD-10-CM | POA: Diagnosis not present

## 2023-06-05 DIAGNOSIS — N179 Acute kidney failure, unspecified: Secondary | ICD-10-CM | POA: Diagnosis not present

## 2023-06-05 DIAGNOSIS — D649 Anemia, unspecified: Secondary | ICD-10-CM | POA: Diagnosis not present

## 2023-06-05 DIAGNOSIS — R296 Repeated falls: Secondary | ICD-10-CM | POA: Diagnosis not present

## 2023-06-05 DIAGNOSIS — R627 Adult failure to thrive: Secondary | ICD-10-CM | POA: Diagnosis not present

## 2023-06-05 DIAGNOSIS — N1832 Chronic kidney disease, stage 3b: Secondary | ICD-10-CM | POA: Diagnosis not present

## 2023-06-05 DIAGNOSIS — R251 Tremor, unspecified: Secondary | ICD-10-CM | POA: Diagnosis not present

## 2023-06-06 DIAGNOSIS — N1832 Chronic kidney disease, stage 3b: Secondary | ICD-10-CM | POA: Diagnosis not present

## 2023-06-06 DIAGNOSIS — Z940 Kidney transplant status: Secondary | ICD-10-CM | POA: Diagnosis not present

## 2023-06-06 DIAGNOSIS — R296 Repeated falls: Secondary | ICD-10-CM | POA: Diagnosis not present

## 2023-06-06 DIAGNOSIS — I129 Hypertensive chronic kidney disease with stage 1 through stage 4 chronic kidney disease, or unspecified chronic kidney disease: Secondary | ICD-10-CM | POA: Diagnosis not present

## 2023-06-06 DIAGNOSIS — N179 Acute kidney failure, unspecified: Secondary | ICD-10-CM | POA: Diagnosis not present

## 2023-06-07 DIAGNOSIS — N179 Acute kidney failure, unspecified: Secondary | ICD-10-CM | POA: Diagnosis not present

## 2023-06-07 DIAGNOSIS — R251 Tremor, unspecified: Secondary | ICD-10-CM | POA: Diagnosis not present

## 2023-06-07 DIAGNOSIS — R627 Adult failure to thrive: Secondary | ICD-10-CM | POA: Diagnosis not present

## 2023-06-07 DIAGNOSIS — N1832 Chronic kidney disease, stage 3b: Secondary | ICD-10-CM | POA: Diagnosis not present

## 2023-06-07 DIAGNOSIS — D649 Anemia, unspecified: Secondary | ICD-10-CM | POA: Diagnosis not present

## 2023-06-07 DIAGNOSIS — Z940 Kidney transplant status: Secondary | ICD-10-CM | POA: Diagnosis not present

## 2023-06-07 DIAGNOSIS — I129 Hypertensive chronic kidney disease with stage 1 through stage 4 chronic kidney disease, or unspecified chronic kidney disease: Secondary | ICD-10-CM | POA: Diagnosis not present

## 2023-06-07 DIAGNOSIS — R296 Repeated falls: Secondary | ICD-10-CM | POA: Diagnosis not present

## 2023-06-08 DIAGNOSIS — D125 Benign neoplasm of sigmoid colon: Secondary | ICD-10-CM | POA: Diagnosis not present

## 2023-06-08 DIAGNOSIS — N183 Chronic kidney disease, stage 3 unspecified: Secondary | ICD-10-CM | POA: Diagnosis not present

## 2023-06-08 DIAGNOSIS — E876 Hypokalemia: Secondary | ICD-10-CM | POA: Diagnosis not present

## 2023-06-08 DIAGNOSIS — R296 Repeated falls: Secondary | ICD-10-CM | POA: Diagnosis not present

## 2023-06-08 DIAGNOSIS — N179 Acute kidney failure, unspecified: Secondary | ICD-10-CM | POA: Diagnosis not present

## 2023-06-08 DIAGNOSIS — I129 Hypertensive chronic kidney disease with stage 1 through stage 4 chronic kidney disease, or unspecified chronic kidney disease: Secondary | ICD-10-CM | POA: Diagnosis not present

## 2023-06-08 DIAGNOSIS — D849 Immunodeficiency, unspecified: Secondary | ICD-10-CM | POA: Diagnosis not present

## 2023-06-08 DIAGNOSIS — Z940 Kidney transplant status: Secondary | ICD-10-CM | POA: Diagnosis not present

## 2023-06-08 DIAGNOSIS — K635 Polyp of colon: Secondary | ICD-10-CM | POA: Diagnosis not present

## 2023-06-08 DIAGNOSIS — K5730 Diverticulosis of large intestine without perforation or abscess without bleeding: Secondary | ICD-10-CM | POA: Diagnosis not present

## 2023-06-08 DIAGNOSIS — D649 Anemia, unspecified: Secondary | ICD-10-CM | POA: Diagnosis not present

## 2023-06-08 DIAGNOSIS — K648 Other hemorrhoids: Secondary | ICD-10-CM | POA: Diagnosis not present

## 2023-06-08 DIAGNOSIS — K633 Ulcer of intestine: Secondary | ICD-10-CM | POA: Diagnosis not present

## 2023-06-08 DIAGNOSIS — D123 Benign neoplasm of transverse colon: Secondary | ICD-10-CM | POA: Diagnosis not present

## 2023-06-09 DIAGNOSIS — N183 Chronic kidney disease, stage 3 unspecified: Secondary | ICD-10-CM | POA: Diagnosis not present

## 2023-06-09 DIAGNOSIS — D649 Anemia, unspecified: Secondary | ICD-10-CM | POA: Diagnosis not present

## 2023-06-09 DIAGNOSIS — N179 Acute kidney failure, unspecified: Secondary | ICD-10-CM | POA: Diagnosis not present

## 2023-06-09 DIAGNOSIS — E876 Hypokalemia: Secondary | ICD-10-CM | POA: Diagnosis not present

## 2023-06-09 DIAGNOSIS — R296 Repeated falls: Secondary | ICD-10-CM | POA: Diagnosis not present

## 2023-06-09 DIAGNOSIS — Z940 Kidney transplant status: Secondary | ICD-10-CM | POA: Diagnosis not present

## 2023-06-09 DIAGNOSIS — I129 Hypertensive chronic kidney disease with stage 1 through stage 4 chronic kidney disease, or unspecified chronic kidney disease: Secondary | ICD-10-CM | POA: Diagnosis not present

## 2023-06-10 DIAGNOSIS — N179 Acute kidney failure, unspecified: Secondary | ICD-10-CM | POA: Diagnosis not present

## 2023-06-10 DIAGNOSIS — Z940 Kidney transplant status: Secondary | ICD-10-CM | POA: Diagnosis not present

## 2023-06-10 DIAGNOSIS — I129 Hypertensive chronic kidney disease with stage 1 through stage 4 chronic kidney disease, or unspecified chronic kidney disease: Secondary | ICD-10-CM | POA: Diagnosis not present

## 2023-06-10 DIAGNOSIS — N183 Chronic kidney disease, stage 3 unspecified: Secondary | ICD-10-CM | POA: Diagnosis not present

## 2023-06-10 DIAGNOSIS — D649 Anemia, unspecified: Secondary | ICD-10-CM | POA: Diagnosis not present

## 2023-06-10 DIAGNOSIS — R296 Repeated falls: Secondary | ICD-10-CM | POA: Diagnosis not present

## 2023-06-16 DIAGNOSIS — N179 Acute kidney failure, unspecified: Secondary | ICD-10-CM | POA: Diagnosis not present

## 2023-06-16 DIAGNOSIS — D649 Anemia, unspecified: Secondary | ICD-10-CM | POA: Diagnosis not present

## 2023-06-29 ENCOUNTER — Encounter: Payer: Self-pay | Admitting: "Endocrinology

## 2023-06-29 ENCOUNTER — Ambulatory Visit: Payer: Medicare Other | Admitting: "Endocrinology

## 2023-06-29 VITALS — BP 100/70 | HR 60 | Ht 62.0 in | Wt 184.0 lb

## 2023-06-29 DIAGNOSIS — Z7984 Long term (current) use of oral hypoglycemic drugs: Secondary | ICD-10-CM | POA: Diagnosis not present

## 2023-06-29 DIAGNOSIS — Z794 Long term (current) use of insulin: Secondary | ICD-10-CM

## 2023-06-29 DIAGNOSIS — E782 Mixed hyperlipidemia: Secondary | ICD-10-CM

## 2023-06-29 DIAGNOSIS — E11649 Type 2 diabetes mellitus with hypoglycemia without coma: Secondary | ICD-10-CM | POA: Diagnosis not present

## 2023-06-29 LAB — POCT GLYCOSYLATED HEMOGLOBIN (HGB A1C): Hemoglobin A1C: 6.6 % — AB (ref 4.0–5.6)

## 2023-06-29 NOTE — Patient Instructions (Addendum)
 Will recommend the following: Farxiga 10 mg daily Lantus  14 units daily Humalog  10 units tidac 15 min before meals   ________   Goals of DM therapy:  Morning Fasting blood sugar: 80-140  Blood sugar before meals: 80-140 Bed time blood sugar: 100-150  A1C <7%, limited only by hypoglycemia  1.Diabetes medications and their side effects discussed, including hypoglycemia    2. Check blood glucose:  a) Always check blood sugars before driving. Please see below (under hypoglycemia) on how to manage b) Check a minimum of 3 times/day or more as needed when having symptoms of hypoglycemia.   c) Try to check blood glucose before sleeping/in the middle of the night to ensure that it is remaining stable and not dropping less than 100 d) Check blood glucose more often if sick  3. Diet: a) 3 meals per day schedule b: Restrict carbs to 60-70 grams (4 servings) per meal c) Colorful vegetables - 3 servings a day, and low sugar fruit 2 servings/day Plate control method: 1/4 plate protein, 1/4 starch, 1/2 green, yellow, or red vegetables d) Avoid carbohydrate snacks unless hypoglycemic episode, or increased physical activity  4. Regular exercise as tolerated, preferably 3 or more hours a week  5. Hypoglycemia: a)  Do not drive or operate machinery without first testing blood glucose to assure it is over 90 mg%, or if dizzy, lightheaded, not feeling normal, etc, or  if foot or leg is numb or weak. b)  If blood glucose less than 70, take four 5gm Glucose tabs or 15-30 gm Glucose gel.  Repeat every 15 min as needed until blood sugar is >100 mg/dl. If hypoglycemia persists then call 911.   6. Sick day management: a) Check blood glucose more often b) Continue usual therapy if blood sugars are elevated.   7. Contact the doctor immediately if blood glucose is frequently <60 mg/dl, or an episode of severe hypoglycemia occurs (where someone had to give you glucose/  glucagon or if you passed out from  a low blood glucose), or if blood glucose is persistently >350 mg/dl, for further management  8. A change in level of physical activity or exercise and a change in diet may also affect your blood sugar. Check blood sugars more often and call if needed.  Instructions: 1. Bring glucose meter, blood glucose records on every visit for review 2. Continue to follow up with primary care physician and other providers for medical care 3. Yearly eye  and foot exam 4. Please get blood work done prior to the next appointment

## 2023-06-29 NOTE — Progress Notes (Signed)
 Outpatient Endocrinology Note Sandy Newcomer, MD  06/29/23   Sandy Roy Feb 12, 1951 [id number]  Referring Provider: Rae Bugler, MD Primary Care Provider: Rae Bugler, MD Reason for consultation: Subjective   Assessment & Plan  Diagnoses and all orders for this visit:  Uncontrolled type 2 diabetes mellitus with hypoglycemia without coma (HCC) -     POCT glycosylated hemoglobin (Hb A1C) -     Lipid panel -     Microalbumin / creatinine urine ratio  Long term (current) use of oral hypoglycemic drugs  Long-term insulin  use (HCC)  Mixed hypercholesterolemia and hypertriglyceridemia -     Lipid panel -     Microalbumin / creatinine urine ratio   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 (s/p kidney transplant, on prednisone  5 mg every day) Lab Results  Component Value Date   GFR 21.61 (L) 09/09/2022   Hba1c goal less than 7, current Hba1c is  Lab Results  Component Value Date   HGBA1C 6.6 (A) 06/29/2023   Will recommend the following: Treated low BG at 71 with two apple juices-patient feeling weak and shaky   Farxiga 10 mg daily Lantus  12-14 units daily Humalog  10 units tidac 15 min before meals   BP noted to be low at 90/50, patient recently discharged from hospital Instructed to hold all BP medication until BP is >120/80 and discuss care with PCP and nephrologist  No known contraindications to any of above medications  -Last LD and Tg are as follows: Lab Results  Component Value Date   LDLCALC 145 (H) 09/09/2022    Lab Results  Component Value Date   TRIG 160.0 (H) 09/09/2022   -On atorvastatin  20 mg every day, ordered repeat lab  -Follow low fat diet and exercise   -Blood pressure goal <140/90 - Microalbumin/creatinine goal < 30, ordered repeat lab  -Last MA/Cr is as follows: Lab Results  Component Value Date   MICROALBUR 43.4 (H) 09/09/2022   -not on ACE/ARB  -diet changes including salt restriction -limit eating  outside -counseled BP targets per standards of diabetes care -uncontrolled blood pressure can lead to retinopathy, nephropathy and cardiovascular and atherosclerotic heart disease  Reviewed and counseled on: -A1C target -Blood sugar targets -Complications of uncontrolled diabetes  -Checking blood sugar before meals and bedtime and bring log next visit -All medications with mechanism of action and side effects -Hypoglycemia management: rule of 15's, Glucagon Emergency Kit and medical alert ID -low-carb low-fat plate-method diet -At least 20 minutes of physical activity per day -Annual dilated retinal eye exam and foot exam -compliance and follow up needs -follow up as scheduled or earlier if problem gets worse  Call if blood sugar is less than 70 or consistently above 250    Take a 15 gm snack of carbohydrate at bedtime before you go to sleep if your blood sugar is less than 100.    If you are going to fast after midnight for a test or procedure, ask your physician for instructions on how to reduce/decrease your insulin  dose.    Call if blood sugar is less than 70 or consistently above 250  -Treating a low sugar by rule of 15  (15 gms of sugar every 15 min until sugar is more than 70) If you feel your sugar is low, test your sugar to be sure If your sugar is low (less than 70), then take 15 grams of a fast acting Carbohydrate (3-4 glucose tablets or glucose gel or 4 ounces  of juice or regular soda) Recheck your sugar 15 min after treating low to make sure it is more than 70 If sugar is still less than 70, treat again with 15 grams of carbohydrate          Don't drive the hour of hypoglycemia  If unconscious/unable to eat or drink by mouth, use glucagon injection or nasal spray baqsimi and call 911. Can repeat again in 15 min if still unconscious.  Return in about 1 month (around 07/29/2023) for visit and 8 am labs before next visit-can do lab the day of appointment .   I have  reviewed current medications, nurse's notes, allergies, vital signs, past medical and surgical history, family medical history, and social history for this encounter. Counseled patient on symptoms, examination findings, lab findings, imaging results, treatment decisions and monitoring and prognosis. The patient understood the recommendations and agrees with the treatment plan. All questions regarding treatment plan were fully answered.  Sandy Newcomer, MD  06/29/23   History of Present Illness Sandy Roy is a 73 y.o. year old female who presents for evaluation of Type II diabetes mellitus.  Sandy Roy was first diagnosed in 1989.    Home diabetes regimen: Farxiga 10 mg daily Lantus  18 units daily Humalog  10 units tid 15 min before meals   Previous history: Non-insulin  hypoglycemic drugs previously used: Unknown Insulin  was started in 2015 Previously was on NPH insulin   COMPLICATIONS -  MI/Stroke -  retinopathy + neuropathy + nephropathy (s/p kidney transplant, on prednisone  5 mg every day)  BLOOD SUGAR DATA  CGM interpretation: At today's visit, we reviewed her CGM downloads. The full report is scanned in the media. Reviewing the CGM trends, BG are frequently low across the day.  Physical Exam  BP 100/70   Pulse 60   Ht 5\' 2"  (1.575 m)   Wt 184 lb (83.5 kg)   SpO2 95%   BMI 33.65 kg/m    Constitutional: well developed, well nourished Head: normocephalic, atraumatic Eyes: sclera anicteric, no redness Neck: supple Lungs: normal respiratory effort Neurology: alert and oriented Skin: dry, no appreciable rashes Musculoskeletal: no appreciable defects Psychiatric: normal mood and affect Diabetic Foot Exam - Simple   No data filed      Current Medications Patient's Medications  New Prescriptions   No medications on file  Previous Medications   APIXABAN  (ELIQUIS ) 5 MG TABS TABLET    Take 1 tablet (5 mg total) by mouth 2 (two) times daily.    ATORVASTATIN  (LIPITOR) 10 MG TABLET    Take 10 mg by mouth daily.   BD INSULIN  SYRINGE U/F 31G X 5/16" 0.5 ML MISC    Inject 1 each into the skin daily at 6pm.   CALCITRIOL  (ROCALTROL ) 0.5 MCG CAPSULE    Take 0.5 mcg by mouth as directed. M/w/f   CICLOPIROX (PENLAC) 8 % SOLUTION    Apply 1 Application topically at bedtime.   CLONIDINE  (CATAPRES ) 0.2 MG TABLET    Take 0.1 mg by mouth daily.   CONTINUOUS GLUCOSE RECEIVER (FREESTYLE LIBRE 2 READER) DEVI    Use to check glucose continuously.   CONTINUOUS GLUCOSE SENSOR (FREESTYLE LIBRE 2 PLUS SENSOR) MISC    Inject 1 each into the skin every 14 (fourteen) days.   DULOXETINE (CYMBALTA) 30 MG CAPSULE    Take 30 mg by mouth daily.   FARXIGA 10 MG TABS TABLET    Take 10 mg by mouth daily.   FISH OIL-OMEGA-3 FATTY ACIDS 1000  MG CAPSULE    Take 1 capsule by mouth daily.   FUROSEMIDE  (LASIX ) 40 MG TABLET    Take 40 mg by mouth 2 (two) times daily.   GLUCOSE BLOOD (ONETOUCH VERIO) TEST STRIP    1 each by Other route 2 (two) times daily. And lancets 2/day Dx code: E11.9   HYDRALAZINE  (APRESOLINE ) 50 MG TABLET    Take 25 mg by mouth in the morning and at bedtime.   INSULIN  GLARGINE (LANTUS ) 100 UNIT/ML INJECTION    Inject 18 units into skin every morning.   INSULIN  LISPRO (HUMALOG ) 100 UNIT/ML INJECTION    Take 10-14 units before each meal   INSULIN  PEN NEEDLE (PEN NEEDLES) 33G X 4 MM MISC    Use to inject insulin  1 pen needle/day   INSULIN  SYRINGE-NEEDLE U-100 (BD INSULIN  SYRINGE U/F) 31G X 5/16" 0.3 ML MISC    Use with insulin  4 times daily   LABETALOL (NORMODYNE) 100 MG TABLET    1 in the morning 1 in the evening   LANCETS (ONETOUCH ULTRASOFT) LANCETS    Use to check blood sugar 2 times per day   LEVOTHYROXINE  (SYNTHROID ) 112 MCG TABLET    Take 1 whole tablet 6 days a week and 0.5 tablet 1 day a week.   LIDOCAINE  (LIDODERM ) 5 %    Place 3 patches onto the skin daily. Remove & Discard patch within 12 hours or as directed by MD   MULTIPLE VITAMINS-MINERALS  (MULTIVITAMIN WITH MINERALS) TABLET    Take 1 tablet by mouth daily.   MYCOPHENOLATE (MYFORTIC) 180 MG EC TABLET    Take 180 mg by mouth 2 (two) times daily.   NITROGLYCERIN  (NITROSTAT ) 0.4 MG SL TABLET    Place 1 tablet (0.4 mg total) under the tongue every 5 (five) minutes as needed for chest pain.   OMEPRAZOLE (PRILOSEC) 20 MG CAPSULE    Take 20 mg by mouth daily.   PREDNISONE  (DELTASONE ) 5 MG TABLET    Take 5 mg by mouth daily with breakfast.   SULFAMETHOXAZOLE-TRIMETHOPRIM (BACTRIM,SEPTRA) 400-80 MG PER TABLET    1 tablet. On Monday Wednesday and Friday   TACROLIMUS  (PROGRAF ) 1 MG CAPSULE    Take 4 mg by mouth. 6 tabs in the morning 6 tabs in the evening   TRIAZOLAM  (HALCION ) 0.25 MG TABLET    2 tablets by mouth at bedtime  Modified Medications   No medications on file  Discontinued Medications   No medications on file    Allergies Allergies  Allergen Reactions   Lisinopril Swelling   Trazodone Hcl     Other reaction(s): depression    Past Medical History Past Medical History:  Diagnosis Date   Chronic kidney disease    ESRD-  Hemo- M_W_F   CONGESTIVE HEART FAILURE 12/14/2006   CORONARY ARTERY DISEASE 12/14/2006   Depression    not recently   DIABETES MELLITUS, TYPE II 12/14/2006   Type 2   HYPERLIPIDEMIA 12/14/2006   HYPERTENSION 12/14/2006   Hypoparathyroidism (HCC) 12/21/2006   Hypopotassemia 01/13/2007   HYPOTHYROIDISM, POSTSURGICAL 12/15/2006   NEOPLASM, MALIGNANT, THYROID  GLAND 12/15/2006   Stage 1 papillary adenocarcinoma, one very small focus each lobe (8mm, 2mm)  6/09: CT neck: no evidence of recurrence   OBSTRUCTIVE SLEEP APNEA 02/23/2007   does not use a cpap   Orbital fracture (HCC)    left eye   Shortness of breath dyspnea    with exertion    Past Surgical History Past Surgical History:  Procedure Laterality  Date   BASCILIC VEIN TRANSPOSITION Right 02/07/2014   Procedure: FIRST STAGE BASILIC VEIN TRANSPOSITION;  Surgeon: Arvil Lauber, MD;  Location: [facility] OR;   Service: Vascular;  Laterality: Right;   BASCILIC VEIN TRANSPOSITION Right 05/30/2014   Procedure: SECOND STAGE BASILIC VEIN TRANSPOSITION;  Surgeon: Arvil Lauber, MD;  [facility];  Service: Vascular;  Laterality: Right;   BREAST SURGERY Left    nipple leaking, had surgery to fix the leak   CARDIAC CATHETERIZATION     ? 10 years ago   CHOLECYSTECTOMY     COLONOSCOPY     ORBITAL FRACTURE SURGERY     OVARY SURGERY     THYROIDECTOMY  2008   TONSILLECTOMY     TUBAL LIGATION      Family History family history includes COPD in her brother and another family member; Cancer in her father and another family member; Diabetes in her mother and another family member; Drug abuse in her brother; Heart attack in her brother; Heart disease in an other family member; Heart failure in her mother; Hyperlipidemia in an other family member; Hypertension in an other family member; Stroke in an other family member.  Social History Social History   Socioeconomic History   Marital status: Widowed    Spouse name: Not on file   Number of children: 2   Years of education: Not on file   Highest education level: Not on file  Occupational History   Not on file  Tobacco Use   Smoking status: Former    Current packs/day: 0.00    Average packs/day: 0.3 packs/day for 10.0 years (2.5 ttl pk-yrs)    Types: Cigarettes    Start date: 50    Quit date: 2000    Years since quitting: 25.3   Smokeless tobacco: Never   Tobacco comments:    "quit years ago"  Vaping Use   Vaping status: Never Used  Substance and Sexual Activity   Alcohol use: No    Alcohol/week: 0.0 standard drinks of alcohol   Drug use: No   Sexual activity: Not on file  Other Topics Concern   Not on file  Social History Narrative   Not on file   Social Drivers of Health   Financial Resource Strain: Not on file  Food Insecurity: Low Risk  (03/07/2023)   Received from [facility]   Hunger Vital Sign    Worried About Running Out  of Food in the Last Year: Never true    Ran Out of Food in the Last Year: Never true  Transportation Needs: No Transportation Needs (03/07/2023)   Received from [facility]    In the past 12 months, has lack of reliable transportation kept you from medical appointments, meetings, work or from getting things needed for daily living? : No  Physical Activity: Not on file  Stress: Not on file  Social Connections: Not on file  Intimate Partner Violence: Not on file    Lab Results  Component Value Date   HGBA1C 6.6 (A) 06/29/2023   HGBA1C 7.2 (A) 03/30/2023   HGBA1C 6.8 (A) 12/26/2022   Lab Results  Component Value Date   CHOL 236 (H) 09/09/2022   Lab Results  Component Value Date   HDL 59.80 09/09/2022   Lab Results  Component Value Date   LDLCALC 145 (H) 09/09/2022   Lab Results  Component Value Date   TRIG 160.0 (H) 09/09/2022   Lab Results  Component Value Date  CHOLHDL 4 09/09/2022   Lab Results  Component Value Date   CREATININE 2.20 (H) 09/09/2022   Lab Results  Component Value Date   GFR 21.61 (L) 09/09/2022   Lab Results  Component Value Date   MICROALBUR 43.4 (H) 09/09/2022      Component Value Date/Time   NA 145 09/09/2022 0835   K 3.6 09/09/2022 0835   CL 103 09/09/2022 0835   CO2 28 09/09/2022 0835   GLUCOSE 50 (L) 09/09/2022 0835   BUN 47 (H) 09/09/2022 0835   CREATININE 2.20 (H) 09/09/2022 0835   CALCIUM  7.7 (L) 09/09/2022 0835   CALCIUM  14.5 (HH) 01/19/2014 2354   PROT 6.8 09/09/2022 0835   ALBUMIN 3.9 09/09/2022 0835   AST 26 09/09/2022 0835   ALT 24 09/09/2022 0835   ALKPHOS 85 09/09/2022 0835   BILITOT 0.6 09/09/2022 0835   GFRNONAA 7 (L) 01/22/2014 0505   GFRAA 8 (L) 01/22/2014 0505      Latest Ref Rng & Units 09/09/2022    8:35 AM 05/13/2022    1:37 PM 11/01/2021    9:11 AM  BMP  Glucose 70 - 99 mg/dL 50  90  39   BUN 6 - 23 mg/dL 47  39  41   Creatinine 0.40 - 1.20 mg/dL 4.09  8.11  9.14   Sodium 135 - 145  mEq/L 145  143  146   Potassium 3.5 - 5.1 mEq/L 3.6  3.7  3.6   Chloride 96 - 112 mEq/L 103  101  104   CO2 19 - 32 mEq/L 28  28  31    Calcium  8.4 - 10.5 mg/dL 7.7  8.8  8.6        Component Value Date/Time   WBC 8.8 01/21/2014 0610   RBC 3.22 (L) 01/21/2014 0610   HGB 9.9 (L) 05/30/2014 0823   HCT 29.0 (L) 05/30/2014 0823   PLT 261 01/21/2014 0610   MCV 88.2 01/21/2014 0610   MCH 28.9 01/21/2014 0610   MCHC 32.7 01/21/2014 0610   RDW 13.1 01/21/2014 0610   LYMPHSABS 2.4 01/19/2014 1905   MONOABS 1.0 01/19/2014 1905   EOSABS 0.1 01/19/2014 1905   BASOSABS 0.0 01/19/2014 1905     Parts of this note may have been dictated using voice recognition software. There may be variances in spelling and vocabulary which are unintentional. Not all errors are proofread. Please notify the [facility] if any discrepancies are noted or if the meaning of any statement is not clear.

## 2023-06-30 ENCOUNTER — Encounter: Payer: Self-pay | Admitting: "Endocrinology

## 2023-07-02 DIAGNOSIS — E119 Type 2 diabetes mellitus without complications: Secondary | ICD-10-CM | POA: Diagnosis not present

## 2023-07-02 DIAGNOSIS — R7989 Other specified abnormal findings of blood chemistry: Secondary | ICD-10-CM | POA: Diagnosis not present

## 2023-07-02 DIAGNOSIS — Z940 Kidney transplant status: Secondary | ICD-10-CM | POA: Diagnosis not present

## 2023-07-14 ENCOUNTER — Other Ambulatory Visit: Payer: Self-pay

## 2023-07-17 ENCOUNTER — Ambulatory Visit: Payer: Self-pay | Admitting: Cardiology

## 2023-07-20 ENCOUNTER — Other Ambulatory Visit

## 2023-07-20 DIAGNOSIS — E782 Mixed hyperlipidemia: Secondary | ICD-10-CM | POA: Diagnosis not present

## 2023-07-20 DIAGNOSIS — E11649 Type 2 diabetes mellitus with hypoglycemia without coma: Secondary | ICD-10-CM | POA: Diagnosis not present

## 2023-07-21 LAB — MICROALBUMIN / CREATININE URINE RATIO
Creatinine, Urine: 161 mg/dL (ref 20–275)
Microalb Creat Ratio: 1491 mg/g{creat} — ABNORMAL HIGH (ref <30)
Microalb, Ur: 240.1 mg/dL

## 2023-07-21 LAB — LIPID PANEL
Cholesterol: 249 mg/dL — ABNORMAL HIGH (ref <200)
HDL: 81 mg/dL (ref >=50)
LDL Cholesterol (Calc): 141 mg/dL — ABNORMAL HIGH
Non-HDL Cholesterol (Calc): 168 mg/dL — ABNORMAL HIGH (ref <130)
Total CHOL/HDL Ratio: 3.1 (calc) (ref <5.0)
Triglycerides: 147 mg/dL (ref <150)

## 2023-07-27 ENCOUNTER — Other Ambulatory Visit: Payer: Self-pay | Admitting: Cardiology

## 2023-07-27 ENCOUNTER — Encounter: Payer: Self-pay | Admitting: "Endocrinology

## 2023-07-27 ENCOUNTER — Ambulatory Visit: Admitting: "Endocrinology

## 2023-07-27 VITALS — BP 120/80 | HR 68 | Ht 62.0 in | Wt 177.0 lb

## 2023-07-27 DIAGNOSIS — Z7984 Long term (current) use of oral hypoglycemic drugs: Secondary | ICD-10-CM

## 2023-07-27 DIAGNOSIS — E11649 Type 2 diabetes mellitus with hypoglycemia without coma: Secondary | ICD-10-CM | POA: Diagnosis not present

## 2023-07-27 DIAGNOSIS — E7800 Pure hypercholesterolemia, unspecified: Secondary | ICD-10-CM

## 2023-07-27 DIAGNOSIS — I480 Paroxysmal atrial fibrillation: Secondary | ICD-10-CM

## 2023-07-27 DIAGNOSIS — Z794 Long term (current) use of insulin: Secondary | ICD-10-CM

## 2023-07-27 MED ORDER — ATORVASTATIN CALCIUM 80 MG PO TABS: 80.0000 mg | ORAL_TABLET | Freq: Every day | ORAL | 1 refills | Status: DC

## 2023-07-27 NOTE — Telephone Encounter (Signed)
 Prescription refill request for Eliquis  received. Indication: PAF Last office visit: 07/17/22  Floyde Huxley MD Scr: 1.72 on 06/10/23  Epic Age: 73 Weight: 85.7kg  Based on above findings Eliquis  5mg  twice daily is the appropriate dose.  Refill approved.

## 2023-07-27 NOTE — Progress Notes (Signed)
 Outpatient Endocrinology Note Jorge Newcomer, MD  07/27/23   Sandy Roy 19-Feb-1951 [id number]  Referring Provider: Rae Bugler, MD Primary Care Provider: Rae Bugler, MD Reason for consultation: Subjective   Assessment & Plan  There are no diagnoses linked to this encounter.  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 (s/p kidney transplant, on prednisone  5 mg every day) Lab Results  Component Value Date   GFR 21.61 (L) 09/09/2022   Hba1c goal less than 7, current Hba1c is  Lab Results  Component Value Date   HGBA1C 6.6 (A) 06/29/2023   Will recommend the following: Farxiga 10 mg daily Lantus  12 units daily Humalog  10-12 units tidac 15 min before meals   BP noted to be low at 90/50, patient recently discharged from hospital Previously in clinic: Treated low BG at 71 with two apple juices-patient feeling weak and shaky   Instructed to hold all BP medication until BP is >120/80 and discuss care with PCP and nephrologist  No known contraindications to any of above medications  -Last LD and Tg are as follows: Lab Results  Component Value Date   LDLCALC 141 (H) 07/20/2023    Lab Results  Component Value Date   TRIG 147 07/20/2023   -On atorvastatin  20 mg every day, LDL 141 -07/27/23: started atorvastatin  80 mg every day  -Follow low fat diet and exercise   -Blood pressure goal <140/90 - Microalbumin/creatinine goal < 30, ordered repeat lab  -Last MA/Cr is as follows: Lab Results  Component Value Date   MICROALBUR 240.1 07/20/2023   -not on ACE/ARB  -diet changes including salt restriction -limit eating outside -counseled BP targets per standards of diabetes care -uncontrolled blood pressure can lead to retinopathy, nephropathy and cardiovascular and atherosclerotic heart disease  Reviewed and counseled on: -A1C target -Blood sugar targets -Complications of uncontrolled diabetes  -Checking blood sugar before meals and bedtime  and bring log next visit -All medications with mechanism of action and side effects -Hypoglycemia management: rule of 15's, Glucagon Emergency Kit and medical alert ID -low-carb low-fat plate-method diet -At least 20 minutes of physical activity per day -Annual dilated retinal eye exam and foot exam -compliance and follow up needs -follow up as scheduled or earlier if problem gets worse  Call if blood sugar is less than 70 or consistently above 250    Take a 15 gm snack of carbohydrate at bedtime before you go to sleep if your blood sugar is less than 100.    If you are going to fast after midnight for a test or procedure, ask your physician for instructions on how to reduce/decrease your insulin  dose.    Call if blood sugar is less than 70 or consistently above 250  -Treating a low sugar by rule of 15  (15 gms of sugar every 15 min until sugar is more than 70) If you feel your sugar is low, test your sugar to be sure If your sugar is low (less than 70), then take 15 grams of a fast acting Carbohydrate (3-4 glucose tablets or glucose gel or 4 ounces of juice or regular soda) Recheck your sugar 15 min after treating low to make sure it is more than 70 If sugar is still less than 70, treat again with 15 grams of carbohydrate          Don't drive the hour of hypoglycemia  If unconscious/unable to eat or drink by mouth, use glucagon injection or nasal spray baqsimi and  call 911. Can repeat again in 15 min if still unconscious.  Return in about 6 weeks (around 09/07/2023).   I have reviewed current medications, nurse's notes, allergies, vital signs, past medical and surgical history, family medical history, and social history for this encounter. Counseled patient on symptoms, examination findings, lab findings, imaging results, treatment decisions and monitoring and prognosis. The patient understood the recommendations and agrees with the treatment plan. All questions regarding treatment plan  were fully answered.  Jorge Newcomer, MD  07/27/23   History of Present Illness Sandy Roy is a 73 y.o. year old female who presents for evaluation of Type II diabetes mellitus.  Lonie Rummell Canniff was first diagnosed in 1989.    Home diabetes regimen: Farxiga 10 mg daily Lantus  14 units daily Humalog  8-10 units tid 15 min before meals   Previous history: Non-insulin  hypoglycemic drugs previously used: Unknown Insulin  was started in 2015 Previously was on NPH insulin   COMPLICATIONS -  MI/Stroke -  retinopathy + neuropathy + nephropathy (s/p kidney transplant, on prednisone  5 mg every day)  BLOOD SUGAR DATA  CGM interpretation: At today's visit, we reviewed her CGM downloads. The full report is scanned in the media. Reviewing the CGM trends, BG are low overnight and elevated in the day.  Physical Exam  BP 120/80   Pulse 68   Ht 5\' 2"  (1.575 m)   Wt 177 lb (80.3 kg)   SpO2 95%   BMI 32.37 kg/m    Constitutional: well developed, well nourished Head: normocephalic, atraumatic Eyes: sclera anicteric, no redness Neck: supple Lungs: normal respiratory effort Neurology: alert and oriented Skin: dry, no appreciable rashes Musculoskeletal: no appreciable defects Psychiatric: normal mood and affect Diabetic Foot Exam - Simple   No data filed      Current Medications Patient's Medications  New Prescriptions   No medications on file  Previous Medications   APIXABAN  (ELIQUIS ) 5 MG TABS TABLET    Take 1 tablet (5 mg total) by mouth 2 (two) times daily.   BD INSULIN  SYRINGE U/F 31G X 5/16" 0.5 ML MISC    Inject 1 each into the skin daily at 6pm.   CALCITRIOL  (ROCALTROL ) 0.5 MCG CAPSULE    Take 0.5 mcg by mouth as directed. M/w/f   CICLOPIROX (PENLAC) 8 % SOLUTION    Apply 1 Application topically at bedtime.   CLONIDINE  (CATAPRES ) 0.2 MG TABLET    Take 0.1 mg by mouth daily.   CONTINUOUS GLUCOSE RECEIVER (FREESTYLE LIBRE 2 READER) DEVI    Use to check glucose  continuously.   CONTINUOUS GLUCOSE SENSOR (FREESTYLE LIBRE 2 PLUS SENSOR) MISC    Inject 1 each into the skin every 14 (fourteen) days.   DULOXETINE (CYMBALTA) 30 MG CAPSULE    Take 30 mg by mouth daily.   FARXIGA 10 MG TABS TABLET    Take 10 mg by mouth daily.   FISH OIL-OMEGA-3 FATTY ACIDS 1000 MG CAPSULE    Take 1 capsule by mouth daily.   FUROSEMIDE  (LASIX ) 40 MG TABLET    Take 40 mg by mouth 2 (two) times daily.   GLUCOSE BLOOD (ONETOUCH VERIO) TEST STRIP    1 each by Other route 2 (two) times daily. And lancets 2/day Dx code: E11.9   HYDRALAZINE  (APRESOLINE ) 50 MG TABLET    Take 25 mg by mouth in the morning and at bedtime.   INSULIN  GLARGINE (LANTUS ) 100 UNIT/ML INJECTION    Inject 18 units into skin every morning.  INSULIN  LISPRO (HUMALOG ) 100 UNIT/ML INJECTION    Take 10-14 units before each meal   INSULIN  PEN NEEDLE (PEN NEEDLES) 33G X 4 MM MISC    Use to inject insulin  1 pen needle/day   INSULIN  SYRINGE-NEEDLE U-100 (BD INSULIN  SYRINGE U/F) 31G X 5/16" 0.3 ML MISC    Use with insulin  4 times daily   LABETALOL (NORMODYNE) 100 MG TABLET    1 in the morning 1 in the evening   LANCETS (ONETOUCH ULTRASOFT) LANCETS    Use to check blood sugar 2 times per day   LEVOTHYROXINE  (SYNTHROID ) 112 MCG TABLET    Take 1 whole tablet 6 days a week and 0.5 tablet 1 day a week.   LIDOCAINE  (LIDODERM ) 5 %    Place 3 patches onto the skin daily. Remove & Discard patch within 12 hours or as directed by MD   MULTIPLE VITAMINS-MINERALS (MULTIVITAMIN WITH MINERALS) TABLET    Take 1 tablet by mouth daily.   MYCOPHENOLATE (MYFORTIC) 180 MG EC TABLET    Take 180 mg by mouth 2 (two) times daily.   NITROGLYCERIN  (NITROSTAT ) 0.4 MG SL TABLET    Place 1 tablet (0.4 mg total) under the tongue every 5 (five) minutes as needed for chest pain.   OMEPRAZOLE (PRILOSEC) 20 MG CAPSULE    Take 20 mg by mouth daily.   PREDNISONE  (DELTASONE ) 5 MG TABLET    Take 5 mg by mouth daily with breakfast.    SULFAMETHOXAZOLE-TRIMETHOPRIM (BACTRIM,SEPTRA) 400-80 MG PER TABLET    1 tablet. On Monday Wednesday and Friday   TACROLIMUS  (PROGRAF ) 1 MG CAPSULE    Take 4 mg by mouth. 6 tabs in the morning 6 tabs in the evening   TRIAZOLAM  (HALCION ) 0.25 MG TABLET    2 tablets by mouth at bedtime  Modified Medications   Modified Medication Previous Medication   ATORVASTATIN  (LIPITOR) 80 MG TABLET atorvastatin  (LIPITOR) 10 MG tablet      Take 1 tablet (80 mg total) by mouth daily.    Take 10 mg by mouth daily.  Discontinued Medications   No medications on file    Allergies Allergies  Allergen Reactions   Lisinopril Swelling   Trazodone Hcl     Other reaction(s): depression    Past Medical History Past Medical History:  Diagnosis Date   Chronic kidney disease    ESRD-  Hemo- M_W_F   CONGESTIVE HEART FAILURE 12/14/2006   CORONARY ARTERY DISEASE 12/14/2006   Depression    not recently   DIABETES MELLITUS, TYPE II 12/14/2006   Type 2   HYPERLIPIDEMIA 12/14/2006   HYPERTENSION 12/14/2006   Hypoparathyroidism (HCC) 12/21/2006   Hypopotassemia 01/13/2007   HYPOTHYROIDISM, POSTSURGICAL 12/15/2006   NEOPLASM, MALIGNANT, THYROID  GLAND 12/15/2006   Stage 1 papillary adenocarcinoma, one very small focus each lobe (8mm, 2mm)  6/09: CT neck: no evidence of recurrence   OBSTRUCTIVE SLEEP APNEA 02/23/2007   does not use a cpap   Orbital fracture (HCC)    left eye   Shortness of breath dyspnea    with exertion    Past Surgical History Past Surgical History:  Procedure Laterality Date   BASCILIC VEIN TRANSPOSITION Right 02/07/2014   Procedure: FIRST STAGE BASILIC VEIN TRANSPOSITION;  Surgeon: Arvil Lauber, MD;  Location: [facility] OR;  Service: Vascular;  Laterality: Right;   BASCILIC VEIN TRANSPOSITION Right 05/30/2014   Procedure: SECOND STAGE BASILIC VEIN TRANSPOSITION;  Surgeon: Arvil Lauber, MD;  Location: [facility] OR;  Service: Vascular;  Laterality:  Right;   BREAST SURGERY Left    nipple leaking, had surgery to  fix the leak   CARDIAC CATHETERIZATION     ? 10 years ago   CHOLECYSTECTOMY     COLONOSCOPY     ORBITAL FRACTURE SURGERY     OVARY SURGERY     THYROIDECTOMY  2008   TONSILLECTOMY     TUBAL LIGATION      Family History family history includes COPD in her brother and another family member; Cancer in her father and another family member; Diabetes in her mother and another family member; Drug abuse in her brother; Heart attack in her brother; Heart disease in an other family member; Heart failure in her mother; Hyperlipidemia in an other family member; Hypertension in an other family member; Stroke in an other family member.  Social History Social History   Socioeconomic History   Marital status: Widowed    Spouse name: Not on file   Number of children: 2   Years of education: Not on file   Highest education level: Not on file  Occupational History   Not on file  Tobacco Use   Smoking status: Former    Current packs/day: 0.00    Average packs/day: 0.3 packs/day for 10.0 years (2.5 ttl pk-yrs)    Types: Cigarettes    Start date: 13    Quit date: 2000    Years since quitting: 25.3   Smokeless tobacco: Never   Tobacco comments:    "quit years ago"  Vaping Use   Vaping status: Never Used  Substance and Sexual Activity   Alcohol use: No    Alcohol/week: 0.0 standard drinks of alcohol   Drug use: No   Sexual activity: Not on file  Other Topics Concern   Not on file  Social History Narrative   Not on file   Social Drivers of Health   Financial Resource Strain: Not on file  Food Insecurity: Low Risk  (03/07/2023)   Received from [facility]   Hunger Vital Sign    Worried About Running Out of Food in the Last Year: Never true    Ran Out of Food in the Last Year: Never true  Transportation Needs: No Transportation Needs (03/07/2023)   Received from [facility]    In the past 12 months, has lack of reliable transportation kept you from medical  appointments, meetings, work or from getting things needed for daily living? : No  Physical Activity: Not on file  Stress: Not on file  Social Connections: Not on file  Intimate Partner Violence: Not on file    Lab Results  Component Value Date   HGBA1C 6.6 (A) 06/29/2023   HGBA1C 7.2 (A) 03/30/2023   HGBA1C 6.8 (A) 12/26/2022   Lab Results  Component Value Date   CHOL 249 (H) 07/20/2023   Lab Results  Component Value Date   HDL 81 07/20/2023   Lab Results  Component Value Date   LDLCALC 141 (H) 07/20/2023   Lab Results  Component Value Date   TRIG 147 07/20/2023   Lab Results  Component Value Date   CHOLHDL 3.1 07/20/2023   Lab Results  Component Value Date   CREATININE 2.20 (H) 09/09/2022   Lab Results  Component Value Date   GFR 21.61 (L) 09/09/2022   Lab Results  Component Value Date   MICROALBUR 240.1 07/20/2023      Component Value Date/Time   NA 145 09/09/2022 0835   K  3.6 09/09/2022 0835   CL 103 09/09/2022 0835   CO2 28 09/09/2022 0835   GLUCOSE 50 (L) 09/09/2022 0835   BUN 47 (H) 09/09/2022 0835   CREATININE 2.20 (H) 09/09/2022 0835   CALCIUM  7.7 (L) 09/09/2022 0835   CALCIUM  14.5 (HH) 01/19/2014 2354   PROT 6.8 09/09/2022 0835   ALBUMIN 3.9 09/09/2022 0835   AST 26 09/09/2022 0835   ALT 24 09/09/2022 0835   ALKPHOS 85 09/09/2022 0835   BILITOT 0.6 09/09/2022 0835   GFRNONAA 7 (L) 01/22/2014 0505   GFRAA 8 (L) 01/22/2014 0505      Latest Ref Rng & Units 09/09/2022    8:35 AM 05/13/2022    1:37 PM 11/01/2021    9:11 AM  BMP  Glucose 70 - 99 mg/dL 50  90  39   BUN 6 - 23 mg/dL 47  39  41   Creatinine 0.40 - 1.20 mg/dL 8.29  5.62  1.30   Sodium 135 - 145 mEq/L 145  143  146   Potassium 3.5 - 5.1 mEq/L 3.6  3.7  3.6   Chloride 96 - 112 mEq/L 103  101  104   CO2 19 - 32 mEq/L 28  28  31    Calcium  8.4 - 10.5 mg/dL 7.7  8.8  8.6        Component Value Date/Time   WBC 8.8 01/21/2014 0610   RBC 3.22 (L) 01/21/2014 0610   HGB 9.9 (L)  05/30/2014 0823   HCT 29.0 (L) 05/30/2014 0823   PLT 261 01/21/2014 0610   MCV 88.2 01/21/2014 0610   MCH 28.9 01/21/2014 0610   MCHC 32.7 01/21/2014 0610   RDW 13.1 01/21/2014 0610   LYMPHSABS 2.4 01/19/2014 1905   MONOABS 1.0 01/19/2014 1905   EOSABS 0.1 01/19/2014 1905   BASOSABS 0.0 01/19/2014 1905     Parts of this note may have been dictated using voice recognition software. There may be variances in spelling and vocabulary which are unintentional. Not all errors are proofread. Please notify the [facility] if any discrepancies are noted or if the meaning of any statement is not clear.

## 2023-07-27 NOTE — Patient Instructions (Signed)
 Will recommend the following: Farxiga 10 mg daily Lantus  10-12 units daily Humalog  8-10 for break fast, 10-12 units units before lunch and supper 15 min before meals   __________________    Goals of DM therapy:  Morning Fasting blood sugar: 80-140  Blood sugar before meals: 80-140 Bed time blood sugar: 100-150  A1C <7%, limited only by hypoglycemia  1.Diabetes medications and their side effects discussed, including hypoglycemia    2. Check blood glucose:  a) Always check blood sugars before driving. Please see below (under hypoglycemia) on how to manage b) Check a minimum of 3 times/day or more as needed when having symptoms of hypoglycemia.   c) Try to check blood glucose before sleeping/in the middle of the night to ensure that it is remaining stable and not dropping less than 100 d) Check blood glucose more often if sick  3. Diet: a) 3 meals per day schedule b: Restrict carbs to 60-70 grams (4 servings) per meal c) Colorful vegetables - 3 servings a day, and low sugar fruit 2 servings/day Plate control method: 1/4 plate protein, 1/4 starch, 1/2 green, yellow, or red vegetables d) Avoid carbohydrate snacks unless hypoglycemic episode, or increased physical activity  4. Regular exercise as tolerated, preferably 3 or more hours a week  5. Hypoglycemia: a)  Do not drive or operate machinery without first testing blood glucose to assure it is over 90 mg%, or if dizzy, lightheaded, not feeling normal, etc, or  if foot or leg is numb or weak. b)  If blood glucose less than 70, take four 5gm Glucose tabs or 15-30 gm Glucose gel.  Repeat every 15 min as needed until blood sugar is >100 mg/dl. If hypoglycemia persists then call 911.   6. Sick day management: a) Check blood glucose more often b) Continue usual therapy if blood sugars are elevated.   7. Contact the doctor immediately if blood glucose is frequently <60 mg/dl, or an episode of severe hypoglycemia occurs (where someone  had to give you glucose/  glucagon or if you passed out from a low blood glucose), or if blood glucose is persistently >350 mg/dl, for further management  8. A change in level of physical activity or exercise and a change in diet may also affect your blood sugar. Check blood sugars more often and call if needed.  Instructions: 1. Bring glucose meter, blood glucose records on every visit for review 2. Continue to follow up with primary care physician and other providers for medical care 3. Yearly eye  and foot exam 4. Please get blood work done prior to the next appointment

## 2023-07-28 ENCOUNTER — Telehealth: Payer: Self-pay

## 2023-07-28 ENCOUNTER — Encounter: Payer: Self-pay | Admitting: Cardiology

## 2023-07-28 ENCOUNTER — Ambulatory Visit: Attending: Cardiology | Admitting: Cardiology

## 2023-07-28 VITALS — BP 104/52 | HR 60 | Resp 16 | Ht 62.0 in

## 2023-07-28 DIAGNOSIS — I480 Paroxysmal atrial fibrillation: Secondary | ICD-10-CM

## 2023-07-28 DIAGNOSIS — I2510 Atherosclerotic heart disease of native coronary artery without angina pectoris: Secondary | ICD-10-CM | POA: Diagnosis not present

## 2023-07-28 DIAGNOSIS — I4819 Other persistent atrial fibrillation: Secondary | ICD-10-CM

## 2023-07-28 NOTE — Telephone Encounter (Signed)
 Pt called office regarding wanting to start taking Ozempic.

## 2023-07-28 NOTE — Patient Instructions (Addendum)
 Medication Instructions:  STOP Farxiga   *If you need a refill on your cardiac medications before your next appointment, please call your pharmacy*   Follow-Up: At [facility], you and your health needs are our priority.  As part of our continuing mission to provide you with exceptional heart care, our providers are all part of one team.  This team includes your primary Cardiologist (physician) and Advanced Practice Providers or APPs (Physician Assistants and Nurse Practitioners) who all work together to provide you with the care you need, when you need it.  Your next appointment:   6 month(s)  Provider:   Dr. Filiberto Hug    We recommend signing up for the patient portal called "MyChart".  Sign up information is provided on this After Visit Summary.  MyChart is used to connect with patients for Virtual Visits (Telemedicine).  Patients are able to view lab/test results, encounter notes, upcoming appointments, etc.  Non-urgent messages can be sent to your provider as well.   To learn more about what you can do with MyChart, go to [email or web address].

## 2023-07-28 NOTE — Progress Notes (Signed)
 Cardiology Office Note:  .   Date:  07/28/2023  ID:  Sandy Roy, DOB 08-15-1950, MRN [id number] PCP: Sandy Bugler, MD  [facility] Providers Cardiologist:  Sandy Ivy, MD PCP: Sandy Bugler, MD  Chief Complaint  Patient presents with   Atrial Fibrillation     Sandy Roy is a 73 y.o. female with hypertension, hyperlipidemia, type 2 DM, CAD, h/o persistent Afib/flutter, CKD s/p kidney transplant  History of Present Illness  Patient is here for annual follow-up.  She has bouts of.  She still drives, but does take time getting in and out of the car now with generalized weakness.  She was hospitalized at [facility] with acute kidney injury, thought to be due to dehydration.  She denies any chest pain, shortness of breath symptoms.  Also denies any leg edema.  Her Lipitor dose was increased from 10 mg daily to 80 mg daily by her endocrinologist given her LDL of 141 recently.     Vitals:   07/28/23 1035  BP: (!) 104/52  Pulse: 60  Resp: 16  SpO2: 95%      Review of Systems  Constitutional: Positive for malaise/fatigue.  Cardiovascular:  Negative for chest pain, dyspnea on exertion, leg swelling, palpitations and syncope.        Studies Reviewed: Sandy Roy   EKG Interpretation Date/Time:  Tuesday Jul 28 2023 10:53:49 EDT Ventricular Rate:  59 PR Interval:  256 QRS Duration:  116 QT Interval:  492 QTC Calculation: 487 R Axis:   -25  Text Interpretation: EKG 07/28/2023: Sinus bradycardia with marked sinus arrhythmia with 1st degree A-V block Right bundle branch block When compared with ECG of 19-Jan-2014 20:34, No significant change was found Confirmed by Sandy Roy [phone]) on 07/28/2023 10:56:07 AM    EKG 07/28/2023: Sinus bradycardia with marked sinus arrhythmia with 1st degree A-V block Right bundle branch block When compared with ECG of 19-Jan-2014 20:34, No significant change was found    Independently interpreted 07/2023: Chol  249, TG 147, HDL 81, LDL 141 HbA1C 6.6% Hb 10.8 Cr 2.1 TSH 6.5  Echocardiogram 02/2023 ([facility]): Moderate left ventricular hypertrophy  The left ventricular size is normal.  Left ventricular systolic function is normal.  LV ejection fraction = 60-65%.  Left ventricular filling pattern is pseudonormal.  The right ventricle is normal in size and function.  The left atrium is moderately dilated.  There is aortic valve sclerosis.  There is no significant valvular stenosis or regurgitation.  There was insufficient TR detected to calculate RV systolic pressure.  The inferior vena cava was not well visualized during the exam.  There is no pericardial effusion.  Compared to the last study dated 07-16-2020, the pericardial effusion is no longer present.    Risk Assessment/Calculations:    CHA2DS2-VASc Score = 4   This indicates a 4.8% annual risk of stroke. The patient's score is based upon: CHF History: 0 HTN History: 1 Diabetes History: 1 Stroke History: 0 Vascular Disease History: 0 Age Score: 1 Gender Score: 1    Physical Exam Vitals and nursing note reviewed.  Constitutional:      General: She is not in acute distress. Neck:     Vascular: No JVD.  Cardiovascular:     Rate and Rhythm: Normal rate and regular rhythm.     Heart sounds: Normal heart sounds. No murmur heard. Pulmonary:     Effort: Pulmonary effort is normal.     Breath sounds: Normal breath sounds. No wheezing  or rales.  Musculoskeletal:     Right lower leg: No edema.     Left lower leg: No edema.      VISIT DIAGNOSES:   ICD-10-CM   1. Coronary artery disease involving native coronary artery of native heart without angina pectoris  I25.10     2. Persistent atrial fibrillation (HCC)  I48.19 EKG 12-Lead       Sandy Roy is a 73 y.o. female with hypertension, hyperlipidemia, type 2 DM, CAD, h/o persistent Afib/flutter, CKD s/p kidney transplant  Assessment & Plan  CAD: Known RCA  CTO.  No angina symptoms.  Continue medical management. Not on aspirin  due to ongoing use of Eliquis . LDL 141, Lipitor recently increased by endocrinologist to 80 mg daily.  Continue the same.  She would like to have lipid panel with her PCP in the next few months.  Type II DM: While she is diabetic, she has had recent dehydration.  Blood pressure is on the lower side.  She certainly not in acute heart failure.  Therefore, I have stopped her Farxiga.  I will forward my note to her endocrinologist Dr. Vertell Roy.  H/o persistent A-fib/flutter: Currently in sinus rhythm.  Continue Eliquis  5 mg twice daily.    F/u in 6 months  Signed, Sandy Das, MD

## 2023-07-31 NOTE — Telephone Encounter (Signed)
 Informed pt that Dr Vertell Gory said that they can discuss pt starting Ozempic during next appt. I asked if she could come in soon then scheduled appt in July 2025, transferred pt to the front desk to help pt with scheduling sooner appt.

## 2023-08-07 DIAGNOSIS — B0229 Other postherpetic nervous system involvement: Secondary | ICD-10-CM | POA: Diagnosis not present

## 2023-08-07 DIAGNOSIS — J90 Pleural effusion, not elsewhere classified: Secondary | ICD-10-CM | POA: Diagnosis not present

## 2023-08-07 DIAGNOSIS — M40204 Unspecified kyphosis, thoracic region: Secondary | ICD-10-CM | POA: Diagnosis not present

## 2023-08-07 DIAGNOSIS — I517 Cardiomegaly: Secondary | ICD-10-CM | POA: Diagnosis not present

## 2023-09-01 DIAGNOSIS — I480 Paroxysmal atrial fibrillation: Secondary | ICD-10-CM | POA: Diagnosis not present

## 2023-09-01 DIAGNOSIS — Z940 Kidney transplant status: Secondary | ICD-10-CM | POA: Diagnosis not present

## 2023-09-01 DIAGNOSIS — G4700 Insomnia, unspecified: Secondary | ICD-10-CM | POA: Diagnosis not present

## 2023-09-01 DIAGNOSIS — E782 Mixed hyperlipidemia: Secondary | ICD-10-CM | POA: Diagnosis not present

## 2023-09-01 DIAGNOSIS — E1122 Type 2 diabetes mellitus with diabetic chronic kidney disease: Secondary | ICD-10-CM | POA: Diagnosis not present

## 2023-09-01 DIAGNOSIS — I129 Hypertensive chronic kidney disease with stage 1 through stage 4 chronic kidney disease, or unspecified chronic kidney disease: Secondary | ICD-10-CM | POA: Diagnosis not present

## 2023-09-01 DIAGNOSIS — Z0000 Encounter for general adult medical examination without abnormal findings: Secondary | ICD-10-CM | POA: Diagnosis not present

## 2023-09-01 DIAGNOSIS — N184 Chronic kidney disease, stage 4 (severe): Secondary | ICD-10-CM | POA: Diagnosis not present

## 2023-09-01 DIAGNOSIS — B0229 Other postherpetic nervous system involvement: Secondary | ICD-10-CM | POA: Diagnosis not present

## 2023-09-01 DIAGNOSIS — E039 Hypothyroidism, unspecified: Secondary | ICD-10-CM | POA: Diagnosis not present

## 2023-09-08 ENCOUNTER — Ambulatory Visit: Admitting: "Endocrinology

## 2023-09-08 ENCOUNTER — Encounter: Payer: Self-pay | Admitting: "Endocrinology

## 2023-09-08 VITALS — BP 100/60 | HR 85 | Ht 62.0 in | Wt 181.0 lb

## 2023-09-08 DIAGNOSIS — Z794 Long term (current) use of insulin: Secondary | ICD-10-CM

## 2023-09-08 DIAGNOSIS — Z85850 Personal history of malignant neoplasm of thyroid: Secondary | ICD-10-CM | POA: Diagnosis not present

## 2023-09-08 DIAGNOSIS — E11649 Type 2 diabetes mellitus with hypoglycemia without coma: Secondary | ICD-10-CM | POA: Diagnosis not present

## 2023-09-08 DIAGNOSIS — E890 Postprocedural hypothyroidism: Secondary | ICD-10-CM | POA: Diagnosis not present

## 2023-09-08 DIAGNOSIS — E7800 Pure hypercholesterolemia, unspecified: Secondary | ICD-10-CM | POA: Diagnosis not present

## 2023-09-08 LAB — POCT GLYCOSYLATED HEMOGLOBIN (HGB A1C): Hemoglobin A1C: 7.1 % — AB (ref 4.0–5.6)

## 2023-09-08 NOTE — Progress Notes (Addendum)
 Outpatient Endocrinology Note Sandy Birmingham, MD  09/08/23   Sandy Roy Apr 15, 1950 [id number]  Referring Provider: Seabron Lenis, MD Primary Care Provider: Seabron Lenis, MD Reason for consultation: Subjective   Assessment & Plan  Diagnoses and all orders for this visit:  Postsurgical hypothyroidism -     TSH + free T4 -     Thyroglobulin Level -     Thyroglobulin antibody  History of thyroid  cancer -     Thyroglobulin Level -     Thyroglobulin antibody  Uncontrolled type 2 diabetes mellitus with hypoglycemia without coma (HCC) -     POCT glycosylated hemoglobin (Hb A1C)  Long-term insulin  use (HCC)  Pure hypercholesterolemia   HYPOTHYROIDISM post thyroidectomy in 2008: 10/26/2006: THYROID , TOTAL THYROIDECTOMY:   - PAPILLARY CARCINOMA, 0.8 CM, LEFT LOBE SUPERIOR   - PAPILLARY MICROCARCINOMA, 0.2 CM, RIGHT LOBE SUPERIOR.   - MARGINS NEGATIVE.   - MULTINODULAR GOITER (MULTINODULAR HYPERPLASIA) WITH   COLLOID CYSTS.   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 Cardiology stopped farxiga due to low bp and concern for dehydration Previously in clinic:  Treated low BG at 71 with two apple juices-patient feeling weak and shaky   Instructed to hold all BP medication until BP is >120/80 and discuss care with PCP and nephrologist  No known contraindications to any of above medications  -Last LD and Tg are as follows: Lab Results  Component Value Date   LDLCALC 141 (H) 07/20/2023    Lab Results  Component Value Date   TRIG 147 07/20/2023   -On atorvastatin  20 mg every day, LDL 141 -07/27/23: started atorvastatin  80 mg every day  -Follow low fat diet and exercise   -Blood pressure goal <140/90 - Microalbumin/creatinine goal < 30, ordered repeat lab  -Last MA/Cr is as follows: Lab Results  Component Value Date   MICROALBUR 240.1 07/20/2023   -not on ACE/ARB  -diet changes including salt restriction -limit eating outside -counseled BP targets per standards of diabetes care -uncontrolled blood pressure can lead to retinopathy, nephropathy and cardiovascular and atherosclerotic heart disease  Reviewed and counseled on: -A1C target -Blood sugar targets -Complications of uncontrolled diabetes  -Checking blood sugar before meals and bedtime and bring log next visit -All medications with mechanism of action and side effects -Hypoglycemia management: rule of 15's, Glucagon Emergency Kit and medical alert ID -low-carb low-fat plate-method diet -At least 20 minutes of physical activity per day -Annual dilated retinal eye exam and foot exam -compliance and follow up needs -follow up as scheduled or earlier if problem gets worse  Call if blood sugar is less than 70 or consistently above 250  Take a 15 gm snack of carbohydrate at bedtime before you go to sleep if your blood sugar is less than 100.    If you are going to fast after midnight for a test or procedure, ask your physician for instructions on how to reduce/decrease your insulin  dose.    Call if blood sugar is less than 70 or consistently above 250  -Treating a low sugar by  rule of 15  (15 gms of sugar every 15 min until sugar is more than 70) If you feel your sugar is low, test your sugar to be sure If your sugar is low (less than 70), then take 15 grams of a fast acting Carbohydrate (3-4 glucose tablets or glucose gel or 4 ounces of juice or regular soda) Recheck your sugar 15 min after treating low to make sure it is more than 70 If sugar is still less than 70, treat again with 15 grams of carbohydrate          Don't drive the hour of hypoglycemia  If unconscious/unable to eat or drink by mouth, use glucagon injection or nasal spray baqsimi and call 911. Can repeat again in 15 min if still unconscious.  Return in about 4 months (around 01/09/2024) for visit, labs today.   I have reviewed current medications, nurse's notes, allergies, vital signs, past medical and surgical history, family medical history, and social history for this encounter. Counseled patient on symptoms, examination findings, lab findings, imaging results, treatment decisions and monitoring and prognosis. The patient understood the recommendations and agrees with the treatment plan. All questions regarding treatment plan were fully answered.  Sandy Birmingham, MD  09/08/23   History of Present Illness Sandy Roy is a 73 y.o. year old female who presents for follow up of postsurgical hypothyroidism and Type II diabetes mellitus.  HYPOTHYROIDISM post thyroidectomy in 2008: Her thyroid  surgery showed 2 small foci of papillary cancer and follow-up ultrasound in 2021 showed only small residual thyroid  tissue She has been on 112 mcg levothyroxine  for the last few years Taking 6 tablets a week  SHARMEKA Roy was first diagnosed in 1989.    Home diabetes regimen: Farxiga 10 mg daily Lantus  14 units daily Humalog  5-10 units tid 15 min before meals   Previous history: Non-insulin  hypoglycemic drugs previously used: Unknown Insulin  was started in 2015 Previously was on NPH  insulin   COMPLICATIONS -  MI/Stroke -  retinopathy + neuropathy + nephropathy (s/p kidney transplant, on prednisone  5 mg every day)  BLOOD SUGAR DATA CGM interpretation: At today's visit, we reviewed her CGM downloads. The full report is scanned in the media. Reviewing the CGM trends, BG are low overnight/morning and elevated in the evening.   Physical Exam  BP 100/60   Pulse 85   Ht 5' 2 (1.575 m)   Wt 181 lb (82.1 kg)   SpO2 98%   BMI 33.11 kg/m    Constitutional: well developed, well nourished Head: normocephalic, atraumatic Eyes: sclera anicteric, no redness Neck: supple Lungs: normal respiratory effort Neurology: alert and oriented Skin: dry, no appreciable rashes Musculoskeletal: no appreciable defects Psychiatric: normal mood and affect Diabetic Foot Exam - Simple   Simple Foot Form Diabetic Foot exam was performed with the following findings: Yes 09/08/2023  9:08 AM  Visual Inspection No deformities, no ulcerations, no other skin breakdown bilaterally: Yes Sensation Testing Intact to touch and monofilament testing bilaterally: Yes Pulse Check Posterior Tibialis and Dorsalis pulse intact bilaterally: Yes Comments  Current Medications Patient's Medications  New Prescriptions   No medications on file  Previous Medications   APIXABAN  (ELIQUIS ) 5 MG TABS TABLET    Take 1 tablet (5 mg total) by mouth 2 (two) times daily.   ATORVASTATIN  (LIPITOR) 80 MG TABLET    Take 1 tablet (80 mg total) by mouth daily.   BD INSULIN  SYRINGE U/F 31G X 5/16 0.5 ML MISC    Inject 1 each into the skin daily at 6pm.   CALCITRIOL  (ROCALTROL ) 0.5 MCG CAPSULE    Take 0.5 mcg by mouth as directed. M/w/f   CLONIDINE  (CATAPRES ) 0.2 MG TABLET    Take 0.1 mg by mouth daily.   CONTINUOUS GLUCOSE RECEIVER (FREESTYLE LIBRE 2 READER) DEVI    Use to check glucose continuously.   CONTINUOUS GLUCOSE SENSOR (FREESTYLE LIBRE 2 PLUS SENSOR) MISC    Inject 1 each into the skin every 14 (fourteen)  days.   DULOXETINE (CYMBALTA) 30 MG CAPSULE    Take 30 mg by mouth daily.   FERROUS SULFATE 325 (65 FE) MG TABLET    Take 325 mg by mouth.   FISH OIL-OMEGA-3 FATTY ACIDS 1000 MG CAPSULE    Take 1 capsule by mouth daily.   FUROSEMIDE  (LASIX ) 40 MG TABLET    Take 40 mg by mouth as needed.   GLUCOSE BLOOD (ONETOUCH VERIO) TEST STRIP    1 each by Other route 2 (two) times daily. And lancets 2/day Dx code: E11.9   HYDRALAZINE  (APRESOLINE ) 50 MG TABLET    Take 25 mg by mouth in the morning and at bedtime.   INSULIN  GLARGINE (LANTUS ) 100 UNIT/ML INJECTION    Inject 18 units into skin every morning.   INSULIN  LISPRO (HUMALOG ) 100 UNIT/ML INJECTION    Take 10-14 units before each meal   INSULIN  PEN NEEDLE (PEN NEEDLES) 33G X 4 MM MISC    Use to inject insulin  1 pen needle/day   INSULIN  SYRINGE-NEEDLE U-100 (BD INSULIN  SYRINGE U/F) 31G X 5/16 0.3 ML MISC    Use with insulin  4 times daily   LABETALOL (NORMODYNE) 100 MG TABLET    1 in the morning 1 in the evening   LANCETS (ONETOUCH ULTRASOFT) LANCETS    Use to check blood sugar 2 times per day   LEVOTHYROXINE  (SYNTHROID ) 112 MCG TABLET    Take 1 whole tablet 6 days a week and 0.5 tablet 1 day a week.   LIDOCAINE  (LIDODERM ) 5 %    Place 3 patches onto the skin daily. Remove & Discard patch within 12 hours or as directed by MD   MULTIPLE VITAMINS-MINERALS (MULTIVITAMIN WITH MINERALS) TABLET    Take 1 tablet by mouth daily.   MYCOPHENOLATE (MYFORTIC) 180 MG EC TABLET    Take 180 mg by mouth 2 (two) times daily.   NITROGLYCERIN  (NITROSTAT ) 0.4 MG SL TABLET    Place 1 tablet (0.4 mg total) under the tongue every 5 (five) minutes as needed for chest pain.   OMEPRAZOLE (PRILOSEC) 20 MG CAPSULE    Take 20 mg by mouth daily.   PREDNISONE  (DELTASONE ) 5 MG TABLET    Take 5 mg by mouth daily with breakfast.   SULFAMETHOXAZOLE-TRIMETHOPRIM (BACTRIM,SEPTRA) 400-80 MG PER TABLET    1 tablet. On Monday Wednesday and Friday   TACROLIMUS  (PROGRAF ) 1 MG CAPSULE    Take 4 mg  by mouth. 4 tabs in the morning 4 tabs in the evening   TRIAZOLAM  (HALCION ) 0.25 MG TABLET    Take 0.25  mg by mouth. 2 tablets by mouth at bedtime  Modified Medications   No medications on file  Discontinued Medications   No medications on file    Allergies Allergies  Allergen Reactions   Lisinopril Swelling   Trazodone Hcl     Other reaction(s): depression    Past Medical History Past Medical History:  Diagnosis Date   Chronic kidney disease    ESRD-  Hemo- M_W_F   CONGESTIVE HEART FAILURE 12/14/2006   CORONARY ARTERY DISEASE 12/14/2006   Depression    not recently   DIABETES MELLITUS, TYPE II 12/14/2006   Type 2   HYPERLIPIDEMIA 12/14/2006   HYPERTENSION 12/14/2006   Hypoparathyroidism (HCC) 12/21/2006   Hypopotassemia 01/13/2007   HYPOTHYROIDISM, POSTSURGICAL 12/15/2006   NEOPLASM, MALIGNANT, THYROID  GLAND 12/15/2006   Stage 1 papillary adenocarcinoma, one very small focus each lobe (8mm, 2mm)  6/09: CT neck: no evidence of recurrence   OBSTRUCTIVE SLEEP APNEA 02/23/2007   does not use a cpap   Orbital fracture (HCC)    left eye   Shortness of breath dyspnea    with exertion    Past Surgical History Past Surgical History:  Procedure Laterality Date   BASCILIC VEIN TRANSPOSITION Right 02/07/2014   Procedure: FIRST STAGE BASILIC VEIN TRANSPOSITION;  Surgeon: Redell LITTIE Door, MD;  Location: [facility] OR;  Service: Vascular;  Laterality: Right;   BASCILIC VEIN TRANSPOSITION Right 05/30/2014   Procedure: SECOND STAGE BASILIC VEIN TRANSPOSITION;  Surgeon: Redell LITTIE Door, MD;  Location: [facility] OR;  Service: Vascular;  Laterality: Right;   BREAST SURGERY Left    nipple leaking, had surgery to fix the leak   CARDIAC CATHETERIZATION     ? 10 years ago   CHOLECYSTECTOMY     COLONOSCOPY     ORBITAL FRACTURE SURGERY     OVARY SURGERY     THYROIDECTOMY  2008   TONSILLECTOMY     TUBAL LIGATION      Family History family history includes COPD in her brother and another family member; Cancer  in her father and another family member; Diabetes in her mother and another family member; Drug abuse in her brother; Heart attack in her brother; Heart disease in an other family member; Heart failure in her mother; Hyperlipidemia in an other family member; Hypertension in an other family member; Stroke in an other family member.  Social History Social History   Socioeconomic History   Marital status: Widowed    Spouse name: Not on file   Number of children: 2   Years of education: Not on file   Highest education level: Not on file  Occupational History   Not on file  Tobacco Use   Smoking status: Former    Current packs/day: 0.00    Average packs/day: 0.3 packs/day for 10.0 years (2.5 ttl pk-yrs)    Types: Cigarettes    Start date: 85    Quit date: 2000    Years since quitting: 25.5   Smokeless tobacco: Never   Tobacco comments:    quit years ago  Vaping Use   Vaping status: Never Used  Substance and Sexual Activity   Alcohol use: No    Alcohol/week: 0.0 standard drinks of alcohol   Drug use: No   Sexual activity: Not on file  Other Topics Concern   Not on file  Social History Narrative   Not on file   Social Drivers of Health   Financial Resource Strain: Not on file  Food Insecurity: Low Risk  (  03/07/2023)   Received from [facility]   Hunger Vital Sign    Within the past 12 months, you worried that your food would run out before you got money to buy more: Never true    Within the past 12 months, the food you bought just didn't last and you didn't have money to get more. : Never true  Transportation Needs: No Transportation Needs (03/07/2023)   Received from [facility]   Transportation    In the past 12 months, has lack of reliable transportation kept you from medical appointments, meetings, work or from getting things needed for daily living? : No  Physical Activity: Not on file  Stress: Not on file  Social Connections: Not on file  Intimate Partner  Violence: Not on file    Lab Results  Component Value Date   HGBA1C 7.1 (A) 09/08/2023   HGBA1C 6.6 (A) 06/29/2023   HGBA1C 7.2 (A) 03/30/2023   Lab Results  Component Value Date   CHOL 249 (H) 07/20/2023   Lab Results  Component Value Date   HDL 81 07/20/2023   Lab Results  Component Value Date   LDLCALC 141 (H) 07/20/2023   Lab Results  Component Value Date   TRIG 147 07/20/2023   Lab Results  Component Value Date   CHOLHDL 3.1 07/20/2023   Lab Results  Component Value Date   CREATININE 2.20 (H) 09/09/2022   Lab Results  Component Value Date   GFR 21.61 (L) 09/09/2022   Lab Results  Component Value Date   MICROALBUR 240.1 07/20/2023      Component Value Date/Time   NA 145 09/09/2022 0835   K 3.6 09/09/2022 0835   CL 103 09/09/2022 0835   CO2 28 09/09/2022 0835   GLUCOSE 50 (L) 09/09/2022 0835   BUN 47 (H) 09/09/2022 0835   CREATININE 2.20 (H) 09/09/2022 0835   CALCIUM  7.7 (L) 09/09/2022 0835   CALCIUM  14.5 (HH) 01/19/2014 2354   PROT 6.8 09/09/2022 0835   ALBUMIN 3.9 09/09/2022 0835   AST 26 09/09/2022 0835   ALT 24 09/09/2022 0835   ALKPHOS 85 09/09/2022 0835   BILITOT 0.6 09/09/2022 0835   GFRNONAA 7 (L) 01/22/2014 0505   GFRAA 8 (L) 01/22/2014 0505      Latest Ref Rng & Units 09/09/2022    8:35 AM 05/13/2022    1:37 PM 11/01/2021    9:11 AM  BMP  Glucose 70 - 99 mg/dL 50  90  39   BUN 6 - 23 mg/dL 47  39  41   Creatinine 0.40 - 1.20 mg/dL 7.79  7.67  8.23   Sodium 135 - 145 mEq/L 145  143  146   Potassium 3.5 - 5.1 mEq/L 3.6  3.7  3.6   Chloride 96 - 112 mEq/L 103  101  104   CO2 19 - 32 mEq/L 28  28  31    Calcium  8.4 - 10.5 mg/dL 7.7  8.8  8.6        Component Value Date/Time   WBC 8.8 01/21/2014 0610   RBC 3.22 (L) 01/21/2014 0610   HGB 9.9 (L) 05/30/2014 0823   HCT 29.0 (L) 05/30/2014 0823   PLT 261 01/21/2014 0610   MCV 88.2 01/21/2014 0610   MCH 28.9 01/21/2014 0610   MCHC 32.7 01/21/2014 0610   RDW 13.1 01/21/2014 0610    LYMPHSABS 2.4 01/19/2014 1905   MONOABS 1.0 01/19/2014 1905   EOSABS 0.1 01/19/2014 1905   BASOSABS  0.0 01/19/2014 1905     Parts of this note may have been dictated using voice recognition software. There may be variances in spelling and vocabulary which are unintentional. Not all errors are proofread. Please notify the dino if any discrepancies are noted or if the meaning of any statement is not clear.

## 2023-09-09 ENCOUNTER — Ambulatory Visit: Payer: Self-pay | Admitting: "Endocrinology

## 2023-09-09 LAB — TSH+FREE T4: TSH W/REFLEX TO FT4: 7.11 m[IU]/L — ABNORMAL HIGH (ref 0.40–4.50)

## 2023-09-09 LAB — T4, FREE: Free T4: 1.3 ng/dL (ref 0.8–1.8)

## 2023-09-10 ENCOUNTER — Other Ambulatory Visit: Payer: Self-pay | Admitting: Cardiology

## 2023-09-10 DIAGNOSIS — Z860100 Personal history of colon polyps, unspecified: Secondary | ICD-10-CM | POA: Diagnosis not present

## 2023-09-10 DIAGNOSIS — K921 Melena: Secondary | ICD-10-CM | POA: Diagnosis not present

## 2023-09-10 DIAGNOSIS — K219 Gastro-esophageal reflux disease without esophagitis: Secondary | ICD-10-CM | POA: Diagnosis not present

## 2023-09-10 DIAGNOSIS — D509 Iron deficiency anemia, unspecified: Secondary | ICD-10-CM | POA: Diagnosis not present

## 2023-09-16 ENCOUNTER — Ambulatory Visit: Admitting: "Endocrinology

## 2023-10-12 DIAGNOSIS — I422 Other hypertrophic cardiomyopathy: Secondary | ICD-10-CM | POA: Diagnosis not present

## 2023-10-12 DIAGNOSIS — E785 Hyperlipidemia, unspecified: Secondary | ICD-10-CM | POA: Diagnosis not present

## 2023-10-12 DIAGNOSIS — B348 Other viral infections of unspecified site: Secondary | ICD-10-CM | POA: Diagnosis not present

## 2023-10-12 DIAGNOSIS — I4891 Unspecified atrial fibrillation: Secondary | ICD-10-CM | POA: Diagnosis not present

## 2023-10-12 DIAGNOSIS — D849 Immunodeficiency, unspecified: Secondary | ICD-10-CM | POA: Diagnosis not present

## 2023-10-12 DIAGNOSIS — Z79899 Other long term (current) drug therapy: Secondary | ICD-10-CM | POA: Diagnosis not present

## 2023-10-12 DIAGNOSIS — E119 Type 2 diabetes mellitus without complications: Secondary | ICD-10-CM | POA: Diagnosis not present

## 2023-10-12 DIAGNOSIS — Z940 Kidney transplant status: Secondary | ICD-10-CM | POA: Diagnosis not present

## 2023-10-12 DIAGNOSIS — Z4822 Encounter for aftercare following kidney transplant: Secondary | ICD-10-CM | POA: Diagnosis not present

## 2023-10-12 DIAGNOSIS — E039 Hypothyroidism, unspecified: Secondary | ICD-10-CM | POA: Diagnosis not present

## 2023-10-12 DIAGNOSIS — I2510 Atherosclerotic heart disease of native coronary artery without angina pectoris: Secondary | ICD-10-CM | POA: Diagnosis not present

## 2023-10-12 DIAGNOSIS — I10 Essential (primary) hypertension: Secondary | ICD-10-CM | POA: Diagnosis not present

## 2023-10-13 ENCOUNTER — Other Ambulatory Visit: Payer: Self-pay | Admitting: "Endocrinology

## 2023-10-13 DIAGNOSIS — E1165 Type 2 diabetes mellitus with hyperglycemia: Secondary | ICD-10-CM

## 2023-10-13 DIAGNOSIS — B0229 Other postherpetic nervous system involvement: Secondary | ICD-10-CM | POA: Diagnosis not present

## 2023-10-13 DIAGNOSIS — Z940 Kidney transplant status: Secondary | ICD-10-CM | POA: Diagnosis not present

## 2023-10-13 NOTE — Telephone Encounter (Signed)
 Requested Prescriptions   Pending Prescriptions Disp Refills   insulin  glargine (LANTUS ) 100 UNIT/ML injection [Pharmacy Med Name: insulin  glargine (U-100) 100 unit/mL subcutaneous solution (LANTUS )] 20 mL 1    Sig: Inject 18 units into skin every morning.

## 2023-12-14 ENCOUNTER — Other Ambulatory Visit: Payer: Self-pay | Admitting: "Endocrinology

## 2023-12-14 ENCOUNTER — Other Ambulatory Visit: Payer: Self-pay | Admitting: Cardiology

## 2023-12-14 DIAGNOSIS — E1165 Type 2 diabetes mellitus with hyperglycemia: Secondary | ICD-10-CM

## 2023-12-21 ENCOUNTER — Other Ambulatory Visit: Payer: Self-pay

## 2023-12-21 DIAGNOSIS — E1165 Type 2 diabetes mellitus with hyperglycemia: Secondary | ICD-10-CM

## 2023-12-21 MED ORDER — FREESTYLE LIBRE 2 PLUS SENSOR MISC: 1.0000 | 3 refills | Status: AC

## 2023-12-29 ENCOUNTER — Other Ambulatory Visit: Payer: Self-pay | Admitting: Cardiology

## 2023-12-29 ENCOUNTER — Other Ambulatory Visit: Payer: Self-pay | Admitting: "Endocrinology

## 2023-12-29 DIAGNOSIS — E1165 Type 2 diabetes mellitus with hyperglycemia: Secondary | ICD-10-CM

## 2023-12-29 DIAGNOSIS — I480 Paroxysmal atrial fibrillation: Secondary | ICD-10-CM

## 2023-12-29 NOTE — Telephone Encounter (Signed)
 Refill request complete

## 2023-12-29 NOTE — Telephone Encounter (Signed)
 Eliquis  5mg  refill request received. Patient is 73 years old, weight-82.1kg, Crea-2.90 on 10/12/23 via [facility] from [facility], Diagnosis-Afib, and last seen by Dr. Elmira on 07/28/23. Dose is appropriate based on dosing criteria. Will send in refill to requested pharmacy.

## 2024-01-07 DIAGNOSIS — I10 Essential (primary) hypertension: Secondary | ICD-10-CM | POA: Diagnosis not present

## 2024-01-07 DIAGNOSIS — E119 Type 2 diabetes mellitus without complications: Secondary | ICD-10-CM | POA: Diagnosis not present

## 2024-01-07 DIAGNOSIS — I4891 Unspecified atrial fibrillation: Secondary | ICD-10-CM | POA: Diagnosis not present

## 2024-01-07 DIAGNOSIS — Z940 Kidney transplant status: Secondary | ICD-10-CM | POA: Diagnosis not present

## 2024-01-07 DIAGNOSIS — R7989 Other specified abnormal findings of blood chemistry: Secondary | ICD-10-CM | POA: Diagnosis not present

## 2024-01-07 DIAGNOSIS — R627 Adult failure to thrive: Secondary | ICD-10-CM | POA: Diagnosis not present

## 2024-01-07 DIAGNOSIS — R531 Weakness: Secondary | ICD-10-CM | POA: Diagnosis not present

## 2024-01-12 ENCOUNTER — Ambulatory Visit: Admitting: "Endocrinology

## 2024-02-08 ENCOUNTER — Ambulatory Visit: Admitting: "Endocrinology

## 2024-02-29 ENCOUNTER — Other Ambulatory Visit: Payer: Self-pay | Admitting: "Endocrinology

## 2024-02-29 DIAGNOSIS — E890 Postprocedural hypothyroidism: Secondary | ICD-10-CM

## 2024-03-31 ENCOUNTER — Ambulatory Visit: Admitting: "Endocrinology

## 2024-03-31 ENCOUNTER — Other Ambulatory Visit: Payer: Self-pay

## 2024-03-31 DIAGNOSIS — E1165 Type 2 diabetes mellitus with hyperglycemia: Secondary | ICD-10-CM

## 2024-03-31 MED ORDER — INSULIN GLARGINE 100 UNIT/ML ~~LOC~~ SOLN: SUBCUTANEOUS | 1 refills | Status: AC

## 2024-05-02 ENCOUNTER — Ambulatory Visit: Admitting: "Endocrinology
